# Patient Record
Sex: Female | Born: 1945
Health system: Southern US, Community
[De-identification: ages and names within clinical notes are randomized; demographics above are authoritative.]

## PROBLEM LIST (undated history)

## (undated) DIAGNOSIS — E669 Obesity, unspecified: Secondary | ICD-10-CM

## (undated) DIAGNOSIS — E739 Lactose intolerance, unspecified: Secondary | ICD-10-CM

## (undated) DIAGNOSIS — M549 Dorsalgia, unspecified: Secondary | ICD-10-CM

## (undated) DIAGNOSIS — K219 Gastro-esophageal reflux disease without esophagitis: Secondary | ICD-10-CM

## (undated) DIAGNOSIS — E039 Hypothyroidism, unspecified: Secondary | ICD-10-CM

## (undated) DIAGNOSIS — M159 Polyosteoarthritis, unspecified: Secondary | ICD-10-CM

## (undated) DIAGNOSIS — E785 Hyperlipidemia, unspecified: Secondary | ICD-10-CM

## (undated) DIAGNOSIS — R600 Localized edema: Secondary | ICD-10-CM

## (undated) DIAGNOSIS — I10 Essential (primary) hypertension: Secondary | ICD-10-CM

## (undated) DIAGNOSIS — M2550 Pain in unspecified joint: Secondary | ICD-10-CM

## (undated) DIAGNOSIS — R5381 Other malaise: Secondary | ICD-10-CM

## (undated) DIAGNOSIS — F419 Anxiety disorder, unspecified: Secondary | ICD-10-CM

## (undated) DIAGNOSIS — G4700 Insomnia, unspecified: Secondary | ICD-10-CM

## (undated) HISTORY — PX: JOINT REPLACEMENT: SHX530

## (undated) HISTORY — DX: Pain in unspecified joint: M25.50

## (undated) HISTORY — PX: HIATAL HERNIA REPAIR: SHX195

## (undated) HISTORY — PX: ECTOPIC PREGNANCY SURGERY: SHX613

## (undated) HISTORY — DX: Dorsalgia, unspecified: M54.9

## (undated) HISTORY — DX: Gastro-esophageal reflux disease without esophagitis: K21.9

## (undated) HISTORY — DX: Lactose intolerance, unspecified: E73.9

## (undated) HISTORY — PX: ELBOW FRACTURE SURGERY: SHX616

## (undated) HISTORY — DX: Hyperlipidemia, unspecified: E78.5

## (undated) HISTORY — PX: TONSILLECTOMY: SUR1361

## (undated) HISTORY — DX: Polyosteoarthritis, unspecified: M15.9

## (undated) HISTORY — DX: Insomnia, unspecified: G47.00

## (undated) HISTORY — DX: Hypothyroidism, unspecified: E03.9

## (undated) HISTORY — PX: TUBAL LIGATION: SHX77

## (undated) HISTORY — DX: Localized edema: R60.0

---
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
## 2013-10-03 DIAGNOSIS — M6530 Trigger finger, unspecified finger: Secondary | ICD-10-CM | POA: Diagnosis not present

## 2013-10-09 DIAGNOSIS — M6530 Trigger finger, unspecified finger: Secondary | ICD-10-CM | POA: Diagnosis not present

## 2013-10-21 DIAGNOSIS — R197 Diarrhea, unspecified: Secondary | ICD-10-CM | POA: Diagnosis not present

## 2013-10-21 DIAGNOSIS — K2100 Gastro-esophageal reflux disease with esophagitis, without bleeding: Secondary | ICD-10-CM | POA: Diagnosis not present

## 2013-10-22 DIAGNOSIS — I10 Essential (primary) hypertension: Secondary | ICD-10-CM | POA: Diagnosis not present

## 2013-10-22 DIAGNOSIS — E039 Hypothyroidism, unspecified: Secondary | ICD-10-CM | POA: Diagnosis not present

## 2013-10-22 DIAGNOSIS — E559 Vitamin D deficiency, unspecified: Secondary | ICD-10-CM | POA: Diagnosis not present

## 2013-10-22 DIAGNOSIS — F3289 Other specified depressive episodes: Secondary | ICD-10-CM | POA: Diagnosis not present

## 2013-10-22 DIAGNOSIS — E782 Mixed hyperlipidemia: Secondary | ICD-10-CM | POA: Diagnosis not present

## 2013-10-22 DIAGNOSIS — F329 Major depressive disorder, single episode, unspecified: Secondary | ICD-10-CM | POA: Diagnosis not present

## 2013-10-22 DIAGNOSIS — E785 Hyperlipidemia, unspecified: Secondary | ICD-10-CM | POA: Diagnosis not present

## 2013-10-22 DIAGNOSIS — Z0000 Encounter for general adult medical examination without abnormal findings: Secondary | ICD-10-CM | POA: Diagnosis not present

## 2013-10-23 DIAGNOSIS — K2100 Gastro-esophageal reflux disease with esophagitis, without bleeding: Secondary | ICD-10-CM | POA: Diagnosis not present

## 2013-10-23 DIAGNOSIS — R197 Diarrhea, unspecified: Secondary | ICD-10-CM | POA: Diagnosis not present

## 2013-11-11 DIAGNOSIS — K2100 Gastro-esophageal reflux disease with esophagitis, without bleeding: Secondary | ICD-10-CM | POA: Diagnosis not present

## 2013-11-11 DIAGNOSIS — F3289 Other specified depressive episodes: Secondary | ICD-10-CM | POA: Diagnosis not present

## 2013-11-11 DIAGNOSIS — R141 Gas pain: Secondary | ICD-10-CM | POA: Diagnosis not present

## 2013-11-11 DIAGNOSIS — R197 Diarrhea, unspecified: Secondary | ICD-10-CM | POA: Diagnosis not present

## 2013-11-11 DIAGNOSIS — M549 Dorsalgia, unspecified: Secondary | ICD-10-CM | POA: Diagnosis not present

## 2013-11-11 DIAGNOSIS — K209 Esophagitis, unspecified without bleeding: Secondary | ICD-10-CM | POA: Diagnosis not present

## 2013-11-11 DIAGNOSIS — K2960 Other gastritis without bleeding: Secondary | ICD-10-CM | POA: Diagnosis not present

## 2013-11-11 DIAGNOSIS — F329 Major depressive disorder, single episode, unspecified: Secondary | ICD-10-CM | POA: Diagnosis not present

## 2013-11-11 DIAGNOSIS — Z823 Family history of stroke: Secondary | ICD-10-CM | POA: Diagnosis not present

## 2013-11-11 DIAGNOSIS — M2550 Pain in unspecified joint: Secondary | ICD-10-CM | POA: Diagnosis not present

## 2013-11-11 DIAGNOSIS — R112 Nausea with vomiting, unspecified: Secondary | ICD-10-CM | POA: Diagnosis not present

## 2013-11-11 DIAGNOSIS — I10 Essential (primary) hypertension: Secondary | ICD-10-CM | POA: Diagnosis not present

## 2013-11-11 DIAGNOSIS — F411 Generalized anxiety disorder: Secondary | ICD-10-CM | POA: Diagnosis not present

## 2013-11-11 DIAGNOSIS — E7800 Pure hypercholesterolemia, unspecified: Secondary | ICD-10-CM | POA: Diagnosis not present

## 2013-11-11 DIAGNOSIS — G4700 Insomnia, unspecified: Secondary | ICD-10-CM | POA: Diagnosis not present

## 2013-11-11 DIAGNOSIS — Z7982 Long term (current) use of aspirin: Secondary | ICD-10-CM | POA: Diagnosis not present

## 2013-11-11 DIAGNOSIS — K449 Diaphragmatic hernia without obstruction or gangrene: Secondary | ICD-10-CM | POA: Diagnosis not present

## 2013-11-11 DIAGNOSIS — E079 Disorder of thyroid, unspecified: Secondary | ICD-10-CM | POA: Diagnosis not present

## 2013-11-19 DIAGNOSIS — E669 Obesity, unspecified: Secondary | ICD-10-CM | POA: Diagnosis not present

## 2013-11-19 DIAGNOSIS — Z6839 Body mass index (BMI) 39.0-39.9, adult: Secondary | ICD-10-CM | POA: Diagnosis not present

## 2013-11-19 DIAGNOSIS — E785 Hyperlipidemia, unspecified: Secondary | ICD-10-CM | POA: Diagnosis not present

## 2013-11-19 DIAGNOSIS — E559 Vitamin D deficiency, unspecified: Secondary | ICD-10-CM | POA: Diagnosis not present

## 2013-11-25 DIAGNOSIS — I6529 Occlusion and stenosis of unspecified carotid artery: Secondary | ICD-10-CM | POA: Diagnosis not present

## 2013-11-25 DIAGNOSIS — Z1231 Encounter for screening mammogram for malignant neoplasm of breast: Secondary | ICD-10-CM | POA: Diagnosis not present

## 2013-12-02 DIAGNOSIS — M5137 Other intervertebral disc degeneration, lumbosacral region: Secondary | ICD-10-CM | POA: Diagnosis not present

## 2013-12-02 DIAGNOSIS — M25819 Other specified joint disorders, unspecified shoulder: Secondary | ICD-10-CM | POA: Diagnosis not present

## 2013-12-05 DIAGNOSIS — K2100 Gastro-esophageal reflux disease with esophagitis, without bleeding: Secondary | ICD-10-CM | POA: Diagnosis not present

## 2013-12-16 DIAGNOSIS — M545 Low back pain, unspecified: Secondary | ICD-10-CM | POA: Diagnosis not present

## 2013-12-16 DIAGNOSIS — M25569 Pain in unspecified knee: Secondary | ICD-10-CM | POA: Diagnosis not present

## 2013-12-19 DIAGNOSIS — M5137 Other intervertebral disc degeneration, lumbosacral region: Secondary | ICD-10-CM | POA: Diagnosis not present

## 2013-12-19 DIAGNOSIS — M545 Low back pain, unspecified: Secondary | ICD-10-CM | POA: Diagnosis not present

## 2013-12-19 DIAGNOSIS — M5126 Other intervertebral disc displacement, lumbar region: Secondary | ICD-10-CM | POA: Diagnosis not present

## 2013-12-26 DIAGNOSIS — M25819 Other specified joint disorders, unspecified shoulder: Secondary | ICD-10-CM | POA: Diagnosis not present

## 2014-01-08 DIAGNOSIS — M546 Pain in thoracic spine: Secondary | ICD-10-CM | POA: Diagnosis not present

## 2014-01-08 DIAGNOSIS — M545 Low back pain, unspecified: Secondary | ICD-10-CM | POA: Diagnosis not present

## 2014-01-09 DIAGNOSIS — E785 Hyperlipidemia, unspecified: Secondary | ICD-10-CM | POA: Diagnosis not present

## 2014-01-09 DIAGNOSIS — Z124 Encounter for screening for malignant neoplasm of cervix: Secondary | ICD-10-CM | POA: Diagnosis not present

## 2014-01-09 DIAGNOSIS — I872 Venous insufficiency (chronic) (peripheral): Secondary | ICD-10-CM | POA: Diagnosis not present

## 2014-01-09 DIAGNOSIS — I10 Essential (primary) hypertension: Secondary | ICD-10-CM | POA: Diagnosis not present

## 2014-01-09 DIAGNOSIS — Z01419 Encounter for gynecological examination (general) (routine) without abnormal findings: Secondary | ICD-10-CM | POA: Diagnosis not present

## 2014-01-27 DIAGNOSIS — H2510 Age-related nuclear cataract, unspecified eye: Secondary | ICD-10-CM | POA: Diagnosis not present

## 2014-01-30 DIAGNOSIS — M545 Low back pain, unspecified: Secondary | ICD-10-CM | POA: Diagnosis not present

## 2014-01-30 DIAGNOSIS — M5126 Other intervertebral disc displacement, lumbar region: Secondary | ICD-10-CM | POA: Diagnosis not present

## 2014-01-30 DIAGNOSIS — M5137 Other intervertebral disc degeneration, lumbosacral region: Secondary | ICD-10-CM | POA: Diagnosis not present

## 2014-01-30 DIAGNOSIS — I119 Hypertensive heart disease without heart failure: Secondary | ICD-10-CM | POA: Diagnosis not present

## 2014-02-11 DIAGNOSIS — M5126 Other intervertebral disc displacement, lumbar region: Secondary | ICD-10-CM | POA: Diagnosis not present

## 2014-02-11 DIAGNOSIS — M25569 Pain in unspecified knee: Secondary | ICD-10-CM | POA: Diagnosis not present

## 2014-02-17 DIAGNOSIS — R609 Edema, unspecified: Secondary | ICD-10-CM | POA: Diagnosis not present

## 2014-02-17 DIAGNOSIS — I872 Venous insufficiency (chronic) (peripheral): Secondary | ICD-10-CM | POA: Diagnosis not present

## 2014-02-17 DIAGNOSIS — I739 Peripheral vascular disease, unspecified: Secondary | ICD-10-CM | POA: Diagnosis not present

## 2014-02-22 DIAGNOSIS — N12 Tubulo-interstitial nephritis, not specified as acute or chronic: Secondary | ICD-10-CM | POA: Diagnosis not present

## 2014-02-22 DIAGNOSIS — K3189 Other diseases of stomach and duodenum: Secondary | ICD-10-CM | POA: Diagnosis not present

## 2014-02-22 DIAGNOSIS — E7800 Pure hypercholesterolemia, unspecified: Secondary | ICD-10-CM | POA: Diagnosis not present

## 2014-02-22 DIAGNOSIS — I10 Essential (primary) hypertension: Secondary | ICD-10-CM | POA: Diagnosis not present

## 2014-02-22 DIAGNOSIS — R1084 Generalized abdominal pain: Secondary | ICD-10-CM | POA: Diagnosis not present

## 2014-02-22 DIAGNOSIS — R110 Nausea: Secondary | ICD-10-CM | POA: Diagnosis not present

## 2014-02-22 DIAGNOSIS — R252 Cramp and spasm: Secondary | ICD-10-CM | POA: Diagnosis not present

## 2014-02-22 DIAGNOSIS — IMO0001 Reserved for inherently not codable concepts without codable children: Secondary | ICD-10-CM | POA: Diagnosis not present

## 2014-02-22 DIAGNOSIS — R109 Unspecified abdominal pain: Secondary | ICD-10-CM | POA: Diagnosis not present

## 2014-02-23 DIAGNOSIS — R109 Unspecified abdominal pain: Secondary | ICD-10-CM | POA: Diagnosis not present

## 2014-03-02 DIAGNOSIS — Z6841 Body Mass Index (BMI) 40.0 and over, adult: Secondary | ICD-10-CM | POA: Diagnosis not present

## 2014-03-02 DIAGNOSIS — N170 Acute kidney failure with tubular necrosis: Secondary | ICD-10-CM | POA: Diagnosis not present

## 2014-03-02 DIAGNOSIS — N390 Urinary tract infection, site not specified: Secondary | ICD-10-CM | POA: Diagnosis not present

## 2014-03-02 DIAGNOSIS — Z888 Allergy status to other drugs, medicaments and biological substances status: Secondary | ICD-10-CM | POA: Diagnosis not present

## 2014-03-02 DIAGNOSIS — A419 Sepsis, unspecified organism: Secondary | ICD-10-CM | POA: Diagnosis not present

## 2014-03-02 DIAGNOSIS — R6520 Severe sepsis without septic shock: Secondary | ICD-10-CM | POA: Diagnosis present

## 2014-03-02 DIAGNOSIS — R109 Unspecified abdominal pain: Secondary | ICD-10-CM | POA: Diagnosis not present

## 2014-03-02 DIAGNOSIS — I129 Hypertensive chronic kidney disease with stage 1 through stage 4 chronic kidney disease, or unspecified chronic kidney disease: Secondary | ICD-10-CM | POA: Diagnosis present

## 2014-03-02 DIAGNOSIS — K5900 Constipation, unspecified: Secondary | ICD-10-CM | POA: Diagnosis not present

## 2014-03-02 DIAGNOSIS — F3289 Other specified depressive episodes: Secondary | ICD-10-CM | POA: Diagnosis present

## 2014-03-02 DIAGNOSIS — E871 Hypo-osmolality and hyponatremia: Secondary | ICD-10-CM | POA: Diagnosis not present

## 2014-03-02 DIAGNOSIS — E872 Acidosis, unspecified: Secondary | ICD-10-CM | POA: Diagnosis present

## 2014-03-02 DIAGNOSIS — G4700 Insomnia, unspecified: Secondary | ICD-10-CM | POA: Diagnosis present

## 2014-03-02 DIAGNOSIS — F329 Major depressive disorder, single episode, unspecified: Secondary | ICD-10-CM | POA: Diagnosis present

## 2014-03-02 DIAGNOSIS — N189 Chronic kidney disease, unspecified: Secondary | ICD-10-CM | POA: Diagnosis present

## 2014-03-02 DIAGNOSIS — Z823 Family history of stroke: Secondary | ICD-10-CM | POA: Diagnosis not present

## 2014-03-02 DIAGNOSIS — R339 Retention of urine, unspecified: Secondary | ICD-10-CM | POA: Diagnosis not present

## 2014-03-02 DIAGNOSIS — K219 Gastro-esophageal reflux disease without esophagitis: Secondary | ICD-10-CM | POA: Diagnosis present

## 2014-03-02 DIAGNOSIS — K449 Diaphragmatic hernia without obstruction or gangrene: Secondary | ICD-10-CM | POA: Diagnosis not present

## 2014-03-02 DIAGNOSIS — Z96659 Presence of unspecified artificial knee joint: Secondary | ICD-10-CM | POA: Diagnosis not present

## 2014-03-02 DIAGNOSIS — Z0389 Encounter for observation for other suspected diseases and conditions ruled out: Secondary | ICD-10-CM | POA: Diagnosis not present

## 2014-03-02 DIAGNOSIS — Z7982 Long term (current) use of aspirin: Secondary | ICD-10-CM | POA: Diagnosis not present

## 2014-03-11 DIAGNOSIS — E871 Hypo-osmolality and hyponatremia: Secondary | ICD-10-CM | POA: Diagnosis not present

## 2014-03-11 DIAGNOSIS — G4700 Insomnia, unspecified: Secondary | ICD-10-CM | POA: Diagnosis not present

## 2014-03-11 DIAGNOSIS — N19 Unspecified kidney failure: Secondary | ICD-10-CM | POA: Diagnosis not present

## 2014-03-11 DIAGNOSIS — I10 Essential (primary) hypertension: Secondary | ICD-10-CM | POA: Diagnosis not present

## 2014-03-17 DIAGNOSIS — I739 Peripheral vascular disease, unspecified: Secondary | ICD-10-CM | POA: Diagnosis not present

## 2014-03-17 DIAGNOSIS — D518 Other vitamin B12 deficiency anemias: Secondary | ICD-10-CM | POA: Diagnosis not present

## 2014-03-18 DIAGNOSIS — M545 Low back pain, unspecified: Secondary | ICD-10-CM | POA: Diagnosis not present

## 2014-03-19 DIAGNOSIS — L819 Disorder of pigmentation, unspecified: Secondary | ICD-10-CM | POA: Diagnosis not present

## 2014-03-19 DIAGNOSIS — L919 Hypertrophic disorder of the skin, unspecified: Secondary | ICD-10-CM | POA: Diagnosis not present

## 2014-03-19 DIAGNOSIS — L909 Atrophic disorder of skin, unspecified: Secondary | ICD-10-CM | POA: Diagnosis not present

## 2014-03-19 DIAGNOSIS — L820 Inflamed seborrheic keratosis: Secondary | ICD-10-CM | POA: Diagnosis not present

## 2014-04-16 DIAGNOSIS — L820 Inflamed seborrheic keratosis: Secondary | ICD-10-CM | POA: Diagnosis not present

## 2014-04-16 DIAGNOSIS — L538 Other specified erythematous conditions: Secondary | ICD-10-CM | POA: Diagnosis not present

## 2014-04-16 DIAGNOSIS — L909 Atrophic disorder of skin, unspecified: Secondary | ICD-10-CM | POA: Diagnosis not present

## 2014-04-16 DIAGNOSIS — L919 Hypertrophic disorder of the skin, unspecified: Secondary | ICD-10-CM | POA: Diagnosis not present

## 2014-04-17 DIAGNOSIS — M48061 Spinal stenosis, lumbar region without neurogenic claudication: Secondary | ICD-10-CM | POA: Diagnosis not present

## 2014-04-17 DIAGNOSIS — I10 Essential (primary) hypertension: Secondary | ICD-10-CM | POA: Diagnosis not present

## 2014-04-17 DIAGNOSIS — M545 Low back pain, unspecified: Secondary | ICD-10-CM | POA: Diagnosis not present

## 2014-04-22 DIAGNOSIS — M545 Low back pain, unspecified: Secondary | ICD-10-CM | POA: Diagnosis not present

## 2014-04-22 DIAGNOSIS — M25569 Pain in unspecified knee: Secondary | ICD-10-CM | POA: Diagnosis not present

## 2014-05-04 DIAGNOSIS — N390 Urinary tract infection, site not specified: Secondary | ICD-10-CM | POA: Diagnosis not present

## 2014-05-06 DIAGNOSIS — M549 Dorsalgia, unspecified: Secondary | ICD-10-CM | POA: Diagnosis not present

## 2014-05-06 DIAGNOSIS — Z7982 Long term (current) use of aspirin: Secondary | ICD-10-CM | POA: Diagnosis not present

## 2014-05-06 DIAGNOSIS — J209 Acute bronchitis, unspecified: Secondary | ICD-10-CM | POA: Diagnosis not present

## 2014-05-06 DIAGNOSIS — E7800 Pure hypercholesterolemia, unspecified: Secondary | ICD-10-CM | POA: Diagnosis not present

## 2014-05-06 DIAGNOSIS — I10 Essential (primary) hypertension: Secondary | ICD-10-CM | POA: Diagnosis not present

## 2014-05-06 DIAGNOSIS — E860 Dehydration: Secondary | ICD-10-CM | POA: Diagnosis not present

## 2014-05-06 DIAGNOSIS — Z79899 Other long term (current) drug therapy: Secondary | ICD-10-CM | POA: Diagnosis not present

## 2014-05-06 DIAGNOSIS — G8929 Other chronic pain: Secondary | ICD-10-CM | POA: Diagnosis not present

## 2014-05-06 DIAGNOSIS — J40 Bronchitis, not specified as acute or chronic: Secondary | ICD-10-CM | POA: Diagnosis not present

## 2014-05-06 DIAGNOSIS — E871 Hypo-osmolality and hyponatremia: Secondary | ICD-10-CM | POA: Diagnosis not present

## 2014-05-06 DIAGNOSIS — I959 Hypotension, unspecified: Secondary | ICD-10-CM | POA: Diagnosis not present

## 2014-05-06 DIAGNOSIS — F3289 Other specified depressive episodes: Secondary | ICD-10-CM | POA: Diagnosis not present

## 2014-05-06 DIAGNOSIS — F329 Major depressive disorder, single episode, unspecified: Secondary | ICD-10-CM | POA: Diagnosis not present

## 2014-05-06 DIAGNOSIS — Z885 Allergy status to narcotic agent status: Secondary | ICD-10-CM | POA: Diagnosis not present

## 2014-05-06 DIAGNOSIS — G4700 Insomnia, unspecified: Secondary | ICD-10-CM | POA: Diagnosis not present

## 2014-05-13 DIAGNOSIS — N390 Urinary tract infection, site not specified: Secondary | ICD-10-CM | POA: Diagnosis not present

## 2014-06-13 DIAGNOSIS — M5416 Radiculopathy, lumbar region: Secondary | ICD-10-CM | POA: Diagnosis not present

## 2014-06-26 DIAGNOSIS — M545 Low back pain: Secondary | ICD-10-CM | POA: Diagnosis not present

## 2014-06-26 DIAGNOSIS — M5136 Other intervertebral disc degeneration, lumbar region: Secondary | ICD-10-CM | POA: Diagnosis not present

## 2014-06-26 DIAGNOSIS — M4806 Spinal stenosis, lumbar region: Secondary | ICD-10-CM | POA: Diagnosis not present

## 2014-07-08 DIAGNOSIS — F329 Major depressive disorder, single episode, unspecified: Secondary | ICD-10-CM | POA: Diagnosis not present

## 2014-07-08 DIAGNOSIS — I10 Essential (primary) hypertension: Secondary | ICD-10-CM | POA: Diagnosis not present

## 2014-07-08 DIAGNOSIS — E782 Mixed hyperlipidemia: Secondary | ICD-10-CM | POA: Diagnosis not present

## 2014-07-08 DIAGNOSIS — Z23 Encounter for immunization: Secondary | ICD-10-CM | POA: Diagnosis not present

## 2014-07-09 DIAGNOSIS — I10 Essential (primary) hypertension: Secondary | ICD-10-CM | POA: Diagnosis not present

## 2014-07-09 DIAGNOSIS — E782 Mixed hyperlipidemia: Secondary | ICD-10-CM | POA: Diagnosis not present

## 2014-07-11 DIAGNOSIS — M5416 Radiculopathy, lumbar region: Secondary | ICD-10-CM | POA: Diagnosis not present

## 2014-07-11 DIAGNOSIS — M5137 Other intervertebral disc degeneration, lumbosacral region: Secondary | ICD-10-CM | POA: Diagnosis not present

## 2014-07-11 DIAGNOSIS — M5127 Other intervertebral disc displacement, lumbosacral region: Secondary | ICD-10-CM | POA: Diagnosis not present

## 2014-07-11 DIAGNOSIS — M545 Low back pain: Secondary | ICD-10-CM | POA: Diagnosis not present

## 2014-07-30 DIAGNOSIS — L298 Other pruritus: Secondary | ICD-10-CM | POA: Diagnosis not present

## 2014-07-30 DIAGNOSIS — L820 Inflamed seborrheic keratosis: Secondary | ICD-10-CM | POA: Diagnosis not present

## 2014-07-30 DIAGNOSIS — L918 Other hypertrophic disorders of the skin: Secondary | ICD-10-CM | POA: Diagnosis not present

## 2014-08-28 DIAGNOSIS — M7052 Other bursitis of knee, left knee: Secondary | ICD-10-CM | POA: Diagnosis not present

## 2014-09-10 DIAGNOSIS — N951 Menopausal and female climacteric states: Secondary | ICD-10-CM | POA: Diagnosis not present

## 2014-09-10 DIAGNOSIS — Z1211 Encounter for screening for malignant neoplasm of colon: Secondary | ICD-10-CM | POA: Diagnosis not present

## 2014-09-10 DIAGNOSIS — Z6841 Body Mass Index (BMI) 40.0 and over, adult: Secondary | ICD-10-CM | POA: Diagnosis not present

## 2014-09-10 DIAGNOSIS — N941 Dyspareunia: Secondary | ICD-10-CM | POA: Diagnosis not present

## 2014-09-11 DIAGNOSIS — F331 Major depressive disorder, recurrent, moderate: Secondary | ICD-10-CM | POA: Diagnosis not present

## 2014-09-11 DIAGNOSIS — I10 Essential (primary) hypertension: Secondary | ICD-10-CM | POA: Diagnosis not present

## 2014-09-11 DIAGNOSIS — E782 Mixed hyperlipidemia: Secondary | ICD-10-CM | POA: Diagnosis not present

## 2014-10-03 DIAGNOSIS — M545 Low back pain: Secondary | ICD-10-CM | POA: Diagnosis not present

## 2014-10-03 DIAGNOSIS — M5416 Radiculopathy, lumbar region: Secondary | ICD-10-CM | POA: Diagnosis not present

## 2014-10-03 DIAGNOSIS — M5127 Other intervertebral disc displacement, lumbosacral region: Secondary | ICD-10-CM | POA: Diagnosis not present

## 2014-10-03 DIAGNOSIS — M5137 Other intervertebral disc degeneration, lumbosacral region: Secondary | ICD-10-CM | POA: Diagnosis not present

## 2014-10-15 DIAGNOSIS — L814 Other melanin hyperpigmentation: Secondary | ICD-10-CM | POA: Diagnosis not present

## 2014-10-15 DIAGNOSIS — L820 Inflamed seborrheic keratosis: Secondary | ICD-10-CM | POA: Diagnosis not present

## 2014-10-15 DIAGNOSIS — L918 Other hypertrophic disorders of the skin: Secondary | ICD-10-CM | POA: Diagnosis not present

## 2014-10-23 DIAGNOSIS — R194 Change in bowel habit: Secondary | ICD-10-CM | POA: Diagnosis not present

## 2014-10-24 DIAGNOSIS — M5441 Lumbago with sciatica, right side: Secondary | ICD-10-CM | POA: Diagnosis not present

## 2014-10-24 DIAGNOSIS — M7061 Trochanteric bursitis, right hip: Secondary | ICD-10-CM | POA: Diagnosis not present

## 2014-10-24 DIAGNOSIS — M25551 Pain in right hip: Secondary | ICD-10-CM | POA: Diagnosis not present

## 2014-10-24 DIAGNOSIS — M5416 Radiculopathy, lumbar region: Secondary | ICD-10-CM | POA: Diagnosis not present

## 2014-10-28 DIAGNOSIS — E669 Obesity, unspecified: Secondary | ICD-10-CM | POA: Diagnosis not present

## 2014-10-28 HISTORY — PX: COLONOSCOPY: SHX174

## 2014-10-29 DIAGNOSIS — Z885 Allergy status to narcotic agent status: Secondary | ICD-10-CM | POA: Diagnosis not present

## 2014-10-29 DIAGNOSIS — K5900 Constipation, unspecified: Secondary | ICD-10-CM | POA: Diagnosis not present

## 2014-10-29 DIAGNOSIS — F419 Anxiety disorder, unspecified: Secondary | ICD-10-CM | POA: Diagnosis not present

## 2014-10-29 DIAGNOSIS — Z791 Long term (current) use of non-steroidal anti-inflammatories (NSAID): Secondary | ICD-10-CM | POA: Diagnosis not present

## 2014-10-29 DIAGNOSIS — E739 Lactose intolerance, unspecified: Secondary | ICD-10-CM | POA: Diagnosis not present

## 2014-10-29 DIAGNOSIS — K648 Other hemorrhoids: Secondary | ICD-10-CM | POA: Diagnosis not present

## 2014-10-29 DIAGNOSIS — I10 Essential (primary) hypertension: Secondary | ICD-10-CM | POA: Diagnosis not present

## 2014-10-29 DIAGNOSIS — E780 Pure hypercholesterolemia: Secondary | ICD-10-CM | POA: Diagnosis not present

## 2014-10-29 DIAGNOSIS — Z7982 Long term (current) use of aspirin: Secondary | ICD-10-CM | POA: Diagnosis not present

## 2014-10-29 DIAGNOSIS — R194 Change in bowel habit: Secondary | ICD-10-CM | POA: Diagnosis not present

## 2014-11-04 DIAGNOSIS — M4806 Spinal stenosis, lumbar region: Secondary | ICD-10-CM | POA: Diagnosis not present

## 2014-11-04 DIAGNOSIS — M5440 Lumbago with sciatica, unspecified side: Secondary | ICD-10-CM | POA: Diagnosis not present

## 2014-11-13 DIAGNOSIS — M25561 Pain in right knee: Secondary | ICD-10-CM | POA: Diagnosis not present

## 2014-11-20 DIAGNOSIS — N390 Urinary tract infection, site not specified: Secondary | ICD-10-CM | POA: Diagnosis not present

## 2014-12-03 DIAGNOSIS — M5416 Radiculopathy, lumbar region: Secondary | ICD-10-CM | POA: Diagnosis not present

## 2014-12-03 DIAGNOSIS — M5137 Other intervertebral disc degeneration, lumbosacral region: Secondary | ICD-10-CM | POA: Diagnosis not present

## 2014-12-03 DIAGNOSIS — M25551 Pain in right hip: Secondary | ICD-10-CM | POA: Diagnosis not present

## 2014-12-03 DIAGNOSIS — N390 Urinary tract infection, site not specified: Secondary | ICD-10-CM | POA: Diagnosis not present

## 2014-12-03 DIAGNOSIS — M7061 Trochanteric bursitis, right hip: Secondary | ICD-10-CM | POA: Diagnosis not present

## 2014-12-03 DIAGNOSIS — R109 Unspecified abdominal pain: Secondary | ICD-10-CM | POA: Diagnosis not present

## 2014-12-04 DIAGNOSIS — N390 Urinary tract infection, site not specified: Secondary | ICD-10-CM | POA: Diagnosis not present

## 2014-12-04 DIAGNOSIS — N289 Disorder of kidney and ureter, unspecified: Secondary | ICD-10-CM | POA: Diagnosis not present

## 2015-01-07 DIAGNOSIS — M79671 Pain in right foot: Secondary | ICD-10-CM | POA: Diagnosis not present

## 2015-01-07 DIAGNOSIS — M722 Plantar fascial fibromatosis: Secondary | ICD-10-CM | POA: Diagnosis not present

## 2015-01-12 DIAGNOSIS — M79671 Pain in right foot: Secondary | ICD-10-CM | POA: Diagnosis not present

## 2015-01-12 DIAGNOSIS — M25674 Stiffness of right foot, not elsewhere classified: Secondary | ICD-10-CM | POA: Diagnosis not present

## 2015-01-12 DIAGNOSIS — R262 Difficulty in walking, not elsewhere classified: Secondary | ICD-10-CM | POA: Diagnosis not present

## 2015-01-12 DIAGNOSIS — M25671 Stiffness of right ankle, not elsewhere classified: Secondary | ICD-10-CM | POA: Diagnosis not present

## 2015-01-14 DIAGNOSIS — L708 Other acne: Secondary | ICD-10-CM | POA: Diagnosis not present

## 2015-01-14 DIAGNOSIS — S30860A Insect bite (nonvenomous) of lower back and pelvis, initial encounter: Secondary | ICD-10-CM | POA: Diagnosis not present

## 2015-01-15 DIAGNOSIS — M25671 Stiffness of right ankle, not elsewhere classified: Secondary | ICD-10-CM | POA: Diagnosis not present

## 2015-01-15 DIAGNOSIS — M79671 Pain in right foot: Secondary | ICD-10-CM | POA: Diagnosis not present

## 2015-01-15 DIAGNOSIS — M25674 Stiffness of right foot, not elsewhere classified: Secondary | ICD-10-CM | POA: Diagnosis not present

## 2015-01-15 DIAGNOSIS — R262 Difficulty in walking, not elsewhere classified: Secondary | ICD-10-CM | POA: Diagnosis not present

## 2015-02-04 DIAGNOSIS — I10 Essential (primary) hypertension: Secondary | ICD-10-CM | POA: Diagnosis not present

## 2015-02-04 DIAGNOSIS — Z1321 Encounter for screening for nutritional disorder: Secondary | ICD-10-CM | POA: Diagnosis not present

## 2015-02-04 DIAGNOSIS — R5383 Other fatigue: Secondary | ICD-10-CM | POA: Diagnosis not present

## 2015-02-04 DIAGNOSIS — E785 Hyperlipidemia, unspecified: Secondary | ICD-10-CM | POA: Diagnosis not present

## 2015-02-04 DIAGNOSIS — F419 Anxiety disorder, unspecified: Secondary | ICD-10-CM | POA: Diagnosis not present

## 2015-02-04 DIAGNOSIS — Z131 Encounter for screening for diabetes mellitus: Secondary | ICD-10-CM | POA: Diagnosis not present

## 2015-02-16 DIAGNOSIS — R5383 Other fatigue: Secondary | ICD-10-CM | POA: Diagnosis not present

## 2015-02-16 DIAGNOSIS — F419 Anxiety disorder, unspecified: Secondary | ICD-10-CM | POA: Diagnosis not present

## 2015-02-16 DIAGNOSIS — E785 Hyperlipidemia, unspecified: Secondary | ICD-10-CM | POA: Diagnosis not present

## 2015-02-18 DIAGNOSIS — M79671 Pain in right foot: Secondary | ICD-10-CM | POA: Diagnosis not present

## 2015-02-18 DIAGNOSIS — M216X1 Other acquired deformities of right foot: Secondary | ICD-10-CM | POA: Diagnosis not present

## 2015-02-18 DIAGNOSIS — M722 Plantar fascial fibromatosis: Secondary | ICD-10-CM | POA: Diagnosis not present

## 2015-02-24 ENCOUNTER — Ambulatory Visit: Payer: Self-pay | Admitting: Orthopedic Surgery

## 2015-03-19 DIAGNOSIS — I10 Essential (primary) hypertension: Secondary | ICD-10-CM | POA: Diagnosis not present

## 2015-03-19 DIAGNOSIS — R0789 Other chest pain: Secondary | ICD-10-CM | POA: Diagnosis not present

## 2015-03-19 DIAGNOSIS — R0600 Dyspnea, unspecified: Secondary | ICD-10-CM | POA: Diagnosis not present

## 2015-03-26 DIAGNOSIS — R0600 Dyspnea, unspecified: Secondary | ICD-10-CM | POA: Diagnosis not present

## 2015-04-27 DIAGNOSIS — I119 Hypertensive heart disease without heart failure: Secondary | ICD-10-CM | POA: Diagnosis not present

## 2015-04-27 DIAGNOSIS — I519 Heart disease, unspecified: Secondary | ICD-10-CM | POA: Diagnosis not present

## 2015-04-27 DIAGNOSIS — E785 Hyperlipidemia, unspecified: Secondary | ICD-10-CM | POA: Diagnosis not present

## 2015-04-27 DIAGNOSIS — R5383 Other fatigue: Secondary | ICD-10-CM | POA: Diagnosis not present

## 2015-04-30 DIAGNOSIS — L723 Sebaceous cyst: Secondary | ICD-10-CM | POA: Diagnosis not present

## 2015-05-22 DIAGNOSIS — Z23 Encounter for immunization: Secondary | ICD-10-CM | POA: Diagnosis not present

## 2015-05-27 ENCOUNTER — Other Ambulatory Visit (HOSPITAL_COMMUNITY): Payer: Self-pay | Admitting: Internal Medicine

## 2015-05-27 DIAGNOSIS — I119 Hypertensive heart disease without heart failure: Secondary | ICD-10-CM | POA: Diagnosis not present

## 2015-05-27 DIAGNOSIS — E559 Vitamin D deficiency, unspecified: Secondary | ICD-10-CM

## 2015-05-27 DIAGNOSIS — I519 Heart disease, unspecified: Secondary | ICD-10-CM | POA: Diagnosis not present

## 2015-05-27 DIAGNOSIS — E785 Hyperlipidemia, unspecified: Secondary | ICD-10-CM | POA: Diagnosis not present

## 2015-05-27 DIAGNOSIS — R739 Hyperglycemia, unspecified: Secondary | ICD-10-CM | POA: Diagnosis not present

## 2015-05-27 DIAGNOSIS — R5383 Other fatigue: Secondary | ICD-10-CM | POA: Diagnosis not present

## 2015-06-10 ENCOUNTER — Other Ambulatory Visit (HOSPITAL_COMMUNITY): Payer: Self-pay

## 2015-06-10 ENCOUNTER — Ambulatory Visit (HOSPITAL_COMMUNITY)
Admission: RE | Admit: 2015-06-10 | Discharge: 2015-06-10 | Disposition: A | Discharge status: Home / Self Care | Payer: Medicare Other | Source: Ambulatory Visit | Attending: Internal Medicine | Admitting: Internal Medicine

## 2015-06-10 DIAGNOSIS — Z780 Asymptomatic menopausal state: Secondary | ICD-10-CM | POA: Insufficient documentation

## 2015-06-10 DIAGNOSIS — Z13820 Encounter for screening for osteoporosis: Secondary | ICD-10-CM | POA: Diagnosis not present

## 2015-06-10 DIAGNOSIS — E559 Vitamin D deficiency, unspecified: Secondary | ICD-10-CM | POA: Insufficient documentation

## 2015-06-24 DIAGNOSIS — H2513 Age-related nuclear cataract, bilateral: Secondary | ICD-10-CM | POA: Diagnosis not present

## 2015-06-24 DIAGNOSIS — H4323 Crystalline deposits in vitreous body, bilateral: Secondary | ICD-10-CM | POA: Diagnosis not present

## 2015-06-24 DIAGNOSIS — H52223 Regular astigmatism, bilateral: Secondary | ICD-10-CM | POA: Diagnosis not present

## 2015-06-24 DIAGNOSIS — H5203 Hypermetropia, bilateral: Secondary | ICD-10-CM | POA: Diagnosis not present

## 2015-07-01 DIAGNOSIS — E785 Hyperlipidemia, unspecified: Secondary | ICD-10-CM | POA: Diagnosis not present

## 2015-07-01 DIAGNOSIS — E559 Vitamin D deficiency, unspecified: Secondary | ICD-10-CM | POA: Diagnosis not present

## 2015-07-01 DIAGNOSIS — M545 Low back pain: Secondary | ICD-10-CM | POA: Diagnosis not present

## 2015-07-01 DIAGNOSIS — I119 Hypertensive heart disease without heart failure: Secondary | ICD-10-CM | POA: Diagnosis not present

## 2015-08-14 DIAGNOSIS — M7671 Peroneal tendinitis, right leg: Secondary | ICD-10-CM | POA: Diagnosis not present

## 2015-08-14 DIAGNOSIS — M79671 Pain in right foot: Secondary | ICD-10-CM | POA: Diagnosis not present

## 2015-08-20 DIAGNOSIS — L309 Dermatitis, unspecified: Secondary | ICD-10-CM | POA: Diagnosis not present

## 2015-09-14 DIAGNOSIS — R739 Hyperglycemia, unspecified: Secondary | ICD-10-CM | POA: Diagnosis not present

## 2015-09-14 DIAGNOSIS — I119 Hypertensive heart disease without heart failure: Secondary | ICD-10-CM | POA: Diagnosis not present

## 2015-09-14 DIAGNOSIS — M8580 Other specified disorders of bone density and structure, unspecified site: Secondary | ICD-10-CM | POA: Diagnosis not present

## 2015-09-14 DIAGNOSIS — E559 Vitamin D deficiency, unspecified: Secondary | ICD-10-CM | POA: Diagnosis not present

## 2015-09-21 DIAGNOSIS — R109 Unspecified abdominal pain: Secondary | ICD-10-CM | POA: Diagnosis not present

## 2015-10-21 DIAGNOSIS — N390 Urinary tract infection, site not specified: Secondary | ICD-10-CM | POA: Diagnosis not present

## 2015-10-27 DIAGNOSIS — Z6838 Body mass index (BMI) 38.0-38.9, adult: Secondary | ICD-10-CM | POA: Diagnosis not present

## 2015-10-29 ENCOUNTER — Ambulatory Visit (INDEPENDENT_AMBULATORY_CARE_PROVIDER_SITE_OTHER): Payer: Medicare Other | Admitting: Orthopaedic Surgery

## 2015-10-29 ENCOUNTER — Ambulatory Visit (INDEPENDENT_AMBULATORY_CARE_PROVIDER_SITE_OTHER): Payer: Medicare Other

## 2015-10-29 VITALS — BP 133/83 | HR 93 | Temp 97.5°F | Ht 61.0 in | Wt 213.6 lb

## 2015-10-29 DIAGNOSIS — M5442 Lumbago with sciatica, left side: Secondary | ICD-10-CM

## 2015-10-29 DIAGNOSIS — M5440 Lumbago with sciatica, unspecified side: Secondary | ICD-10-CM

## 2015-10-29 MED ORDER — HYDROCODONE-ACETAMINOPHEN 5-325 MG PO TABS
1.0000 | ORAL_TABLET | ORAL | Status: DC | PRN
Start: 2015-10-29 — End: 2015-12-08

## 2015-10-29 NOTE — Patient Instructions (Signed)
To get MRI of the lumbar spine

## 2015-10-29 NOTE — Progress Notes (Addendum)
CC:  Lower back pain with left sided shooting pain  Subjective:    Patient ID: Annette Saunders, female    DOB: [birth year], 70 y.o.   MRN: [id number]  Back Pain This is a chronic problem. The current episode started more than 1 year ago. The problem occurs daily. The problem has been gradually worsening since onset. The pain is present in the lumbar spine. The quality of the pain is described as aching, burning and shooting. The pain radiates to the left knee, left thigh and left foot. The pain is at a severity of 5/10. The pain is moderate. The pain is worse during the day. The symptoms are aggravated by bending, standing, twisting and stress. Pertinent negatives include no chest pain. She has tried bed rest, home exercises, heat, ice, muscle relaxant and walking for the symptoms. The treatment provided mild relief.   She has had pain in the back on and off for years.  She had epidurals when she lived in [place] several years ago that helped.  She has just recently had more pain that has not responded to her rest and home treatments.   Review of Systems  Constitutional:       Patient does not have diabetes Patient has hypertension Patient does not have COPD Patient does not smoke.  HENT: Negative for congestion.   Respiratory: Negative for cough and shortness of breath.   Cardiovascular: Negative for chest pain.  Musculoskeletal: Positive for myalgias and back pain.  Allergic/Immunologic: Negative for environmental allergies.  All other systems reviewed and are negative.  Social History   Social History  . Marital Status: Widowed    Spouse Name: N/A  . Number of Children: N/A  . Years of Education: N/A   Occupational History  . Not on file.   Social History Main Topics  . Smoking status: Not on file  . Smokeless tobacco: Not on file  . Alcohol Use: Not on file  . Drug Use: Not on file  . Sexual Activity: Not on file   Other Topics Concern  . Not on file   Social History  Narrative  . No narrative on file   No past surgical history on file.  No past medical history on file.  The patient has a family history of hypertension  BP 133/83 mmHg  Pulse 93  Temp(Src) 97.5 F (36.4 C)  Ht 5\' 1"  (1.549 m)  Wt 213 lb 9.6 oz (96.888 kg)  BMI 40.38 kg/m2      Objective:   Physical Exam  Constitutional: She is oriented to person, place, and time. She appears well-developed and well-nourished.  HENT:  Head: Normocephalic and atraumatic.  Eyes: Conjunctivae and EOM are normal. Pupils are equal, round, and reactive to light.  Neck: Normal range of motion. Neck supple.  Cardiovascular: Normal rate, regular rhythm, normal heart sounds and intact distal pulses.   Pulmonary/Chest: Effort normal and breath sounds normal.  Abdominal: Soft.  Musculoskeletal: She exhibits tenderness (Pain midline lower back. She has no spams, no defects. ).       Lumbar back: She exhibits tenderness and pain.       Back:  Neurological: She is alert and oriented to person, place, and time. She has normal reflexes. She displays normal reflexes. No cranial nerve deficit. She exhibits normal muscle tone. Coordination normal.  Skin: Skin is warm and dry.  Psychiatric: She has a normal mood and affect. Her behavior is normal. Judgment and thought content normal.   Spine/Pelvis  examination:  Inspection:  Overall, sacoiliac joint benign and hips nontender; without crepitus or defects.   Thoracic spine inspection: Alignment normal without kyphosis present   Lumbar spine inspection:  Alignment  with normal lumbar lordosis, without scoliosis apparent.   Thoracic spine palpation:  without tenderness of spinal processes   Lumbar spine palpation: with tenderness of lumbar area; without tightness of lumbar muscles    Range of Motion:   Lumbar flexion, forward flexion is 25  with pain or tenderness    Lumbar extension is 5  with pain or tenderness   Left lateral bend is Normal  without pain  or tenderness   Right lateral bend is Normal without pain or tenderness   Straight leg raising is Normal   Strength & tone: Normal   Stability overall normal stability    Her hypertension is well controlled.  She is trying to be active and she is taking care of her elderly mother and the lifting that she does with her is making the patient's back pain worse. Encounter Diagnosis  Name Primary?  . Midline low back pain with left-sided sciatica Yes        Assessment & Plan:  Lower back pain with left sided sciatica  I will get MRI of the lumbar spine  Pain medicine has been given  Call if any problems.

## 2015-11-04 DIAGNOSIS — R0600 Dyspnea, unspecified: Secondary | ICD-10-CM | POA: Diagnosis not present

## 2015-11-04 DIAGNOSIS — E559 Vitamin D deficiency, unspecified: Secondary | ICD-10-CM | POA: Diagnosis not present

## 2015-11-04 DIAGNOSIS — Z13220 Encounter for screening for lipoid disorders: Secondary | ICD-10-CM | POA: Diagnosis not present

## 2015-11-04 DIAGNOSIS — M7070 Other bursitis of hip, unspecified hip: Secondary | ICD-10-CM | POA: Diagnosis not present

## 2015-11-04 DIAGNOSIS — R5383 Other fatigue: Secondary | ICD-10-CM | POA: Diagnosis not present

## 2015-11-04 DIAGNOSIS — R739 Hyperglycemia, unspecified: Secondary | ICD-10-CM | POA: Diagnosis not present

## 2015-11-04 DIAGNOSIS — Z6838 Body mass index (BMI) 38.0-38.9, adult: Secondary | ICD-10-CM | POA: Diagnosis not present

## 2015-11-09 DIAGNOSIS — Z6839 Body mass index (BMI) 39.0-39.9, adult: Secondary | ICD-10-CM | POA: Diagnosis not present

## 2015-11-10 ENCOUNTER — Other Ambulatory Visit: Payer: Self-pay | Admitting: *Deleted

## 2015-11-10 DIAGNOSIS — M5442 Lumbago with sciatica, left side: Secondary | ICD-10-CM

## 2015-11-19 ENCOUNTER — Ambulatory Visit (HOSPITAL_COMMUNITY)
Admission: RE | Admit: 2015-11-19 | Discharge: 2015-11-19 | Disposition: A | Discharge status: Home / Self Care | Payer: Medicare Other | Source: Ambulatory Visit | Attending: Orthopaedic Surgery | Admitting: Orthopaedic Surgery

## 2015-11-19 DIAGNOSIS — Z6838 Body mass index (BMI) 38.0-38.9, adult: Secondary | ICD-10-CM | POA: Diagnosis not present

## 2015-11-19 DIAGNOSIS — M419 Scoliosis, unspecified: Secondary | ICD-10-CM | POA: Diagnosis not present

## 2015-11-19 DIAGNOSIS — M479 Spondylosis, unspecified: Secondary | ICD-10-CM | POA: Diagnosis not present

## 2015-11-19 DIAGNOSIS — M5125 Other intervertebral disc displacement, thoracolumbar region: Secondary | ICD-10-CM | POA: Insufficient documentation

## 2015-11-19 DIAGNOSIS — M5442 Lumbago with sciatica, left side: Secondary | ICD-10-CM | POA: Insufficient documentation

## 2015-11-19 DIAGNOSIS — M4807 Spinal stenosis, lumbosacral region: Secondary | ICD-10-CM | POA: Insufficient documentation

## 2015-11-19 DIAGNOSIS — M4806 Spinal stenosis, lumbar region: Secondary | ICD-10-CM | POA: Diagnosis not present

## 2015-11-19 DIAGNOSIS — M2578 Osteophyte, vertebrae: Secondary | ICD-10-CM | POA: Diagnosis not present

## 2015-11-25 ENCOUNTER — Telehealth: Payer: Self-pay | Admitting: Radiology

## 2015-11-25 NOTE — Telephone Encounter (Signed)
I left 2 messages on the patients answering machine with her MRI appointment at [facility]. She has [place], no precert was needed.

## 2015-11-30 DIAGNOSIS — Z6839 Body mass index (BMI) 39.0-39.9, adult: Secondary | ICD-10-CM | POA: Diagnosis not present

## 2015-12-01 ENCOUNTER — Ambulatory Visit: Payer: Medicare Other | Admitting: Orthopaedic Surgery

## 2015-12-01 ENCOUNTER — Encounter: Payer: Self-pay | Admitting: Orthopaedic Surgery

## 2015-12-01 ENCOUNTER — Ambulatory Visit (INDEPENDENT_AMBULATORY_CARE_PROVIDER_SITE_OTHER): Payer: Medicare Other | Admitting: Orthopaedic Surgery

## 2015-12-01 VITALS — BP 89/63 | HR 71 | Temp 97.9°F | Resp 16 | Ht 61.0 in | Wt 212.0 lb

## 2015-12-01 DIAGNOSIS — M5136 Other intervertebral disc degeneration, lumbar region: Secondary | ICD-10-CM

## 2015-12-01 DIAGNOSIS — M5442 Lumbago with sciatica, left side: Secondary | ICD-10-CM | POA: Diagnosis not present

## 2015-12-01 DIAGNOSIS — M5126 Other intervertebral disc displacement, lumbar region: Secondary | ICD-10-CM | POA: Diagnosis not present

## 2015-12-01 NOTE — Patient Instructions (Signed)
You have been referred to [place] [facility] for possible ESI.

## 2015-12-01 NOTE — Progress Notes (Signed)
Patient [id number] Annette Saunders, female DOB:07/23/1946, [name]  Chief Complaint  Patient presents with  . Follow-up    follow up MRI results L spine    HPI  Annette Saunders is a 70 y.o. female who has left sided sciatica that is unchanged.  She had the MRI of the lumbar spine.  She has no bowel or bladder problem.  She has no trauma.  She is not taking her medicine.   MRI report is as follows: IMPRESSION: 1. Multilevel lumbar spondylosis associated with a convex right scoliosis. 2. Right paracentral disc extrusion at T12-L1 with probable associated epidural blood. No mass effect on the conus or exiting nerve roots. 3. Eccentric disc bulging and endplate osteophytes related to the scoliosis, contributing to mild foraminal narrowing and potential extraforaminal nerve root encroachment on the left at L3-4, on the right at L4-5 and bilaterally at L5-S1. HPI  Body mass index is 40.08 kg/(m^2).  Review of Systems  Constitutional:       Patient does not have diabetes Patient has hypertension Patient does not have COPD Patient does not smoke.  HENT: Negative for congestion.   Respiratory: Negative for cough and shortness of breath.   Cardiovascular: Negative for chest pain.  Musculoskeletal: Positive for myalgias and back pain.  Allergic/Immunologic: Negative for environmental allergies.  All other systems reviewed and are negative.   No past medical history on file.  No past surgical history on file.  No family history on file.  Social History Social History  Substance Use Topics  . Smoking status: Never Smoker   . Smokeless tobacco: None  . Alcohol Use: None    No Known Allergies  Current Outpatient Prescriptions  Medication Sig Dispense Refill  . amLODipine (NORVASC) 2.5 MG tablet Take 2.5 mg by mouth daily.    [name] aspirin 81 MG tablet Take 81 mg by mouth daily.    [name] atorvastatin (LIPITOR) 40 MG tablet Take 40 mg by mouth daily.    [name] esomeprazole  (NEXIUM) 20 MG capsule Take 20 mg by mouth daily at 12 noon.    [name] HYDROcodone-acetaminophen (NORCO/VICODIN) 5-325 MG tablet Take 1 tablet by mouth every 4 (four) hours as needed for moderate pain (Must last 30 days.  Do not take and drive a car or use machinery.). 120 tablet 0  . lisinopril (PRINIVIL,ZESTRIL) 20 MG tablet Take 20 mg by mouth daily.    . TURMERIC PO Take by mouth.     No current facility-administered medications for this visit.     Physical Exam  Blood pressure 89/63, pulse 71, temperature 97.9 F (36.6 C), resp. rate 16, height 5\' 1"  (1.549 m), weight 212 lb (96.163 kg).  Constitutional: overall normal hygiene, normal nutrition, well developed, normal grooming, normal body habitus. Assistive device:none  Musculoskeletal: gait and station Limp none, muscle tone and strength are normal, no tremors or atrophy is present.  .  Neurological: coordination overall normal.  Deep tendon reflex/nerve stretch intact.  Sensation normal.  Cranial nerves II-XII intact.   Skin:   normal overall no scars, lesions, ulcers or rashes. No psoriasis.  Psychiatric: Alert and oriented x 3.  Recent memory intact, remote memory unclear.  Normal mood and affect. Well groomed.  Good eye contact.  Cardiovascular: overall no swelling, no varicosities, no edema bilaterally, normal temperatures of the legs and arms, no clubbing, cyanosis and good capillary refill.  Lymphatic: palpation is normal.  Spine/Pelvis examination:  Inspection:  Overall, sacoiliac joint benign and hips nontender; without crepitus  or defects.   Thoracic spine inspection: Alignment normal without kyphosis present   Lumbar spine inspection:  Alignment  with normal lumbar lordosis, with scoliosis apparent.   Thoracic spine palpation:  without tenderness of spinal processes   Lumbar spine palpation: with tenderness of lumbar area; without tightness of lumbar muscles    Range of Motion:   Lumbar flexion, forward flexion  is 35  without pain or tenderness    Lumbar extension is 5  without pain or tenderness   Left lateral bend is Normal  without pain or tenderness   Right lateral bend is Normal without pain or tenderness   Straight leg raising is Abnormal- 25 degrees left   Strength & tone: Normal   Stability overall normal stability   The patient has been educated about the nature of the problem(s) and counseled on treatment options.  The patient appeared to understand what I have discussed and is in agreement with it.  Encounter Diagnoses  Name Primary?  . Bulging lumbar disc Yes  . Midline low back pain with left-sided sciatica     PLAN Call if any problems.  Precautions discussed.  Continue current medications.   Return to clinic To be seen at [facility] for evaluation for epidurals.  She has had epidural in the back when she lived in [place] and it helped her considerably.

## 2015-12-07 ENCOUNTER — Encounter: Payer: Self-pay | Admitting: *Deleted

## 2015-12-07 NOTE — Telephone Encounter (Signed)
This encounter was created in error - please disregard.

## 2015-12-08 ENCOUNTER — Ambulatory Visit (INDEPENDENT_AMBULATORY_CARE_PROVIDER_SITE_OTHER): Payer: Medicare Other

## 2015-12-08 ENCOUNTER — Ambulatory Visit (INDEPENDENT_AMBULATORY_CARE_PROVIDER_SITE_OTHER): Payer: Medicare Other | Admitting: Orthopaedic Surgery

## 2015-12-08 VITALS — BP 117/59 | HR 89 | Temp 97.3°F | Ht 61.0 in | Wt 212.0 lb

## 2015-12-08 DIAGNOSIS — M5442 Lumbago with sciatica, left side: Secondary | ICD-10-CM

## 2015-12-08 DIAGNOSIS — M25511 Pain in right shoulder: Secondary | ICD-10-CM

## 2015-12-08 MED ORDER — HYDROCODONE-ACETAMINOPHEN 7.5-325 MG PO TABS: 1.0000 | ORAL_TABLET | ORAL | Status: DC | PRN

## 2015-12-08 NOTE — Progress Notes (Signed)
Patient Annette Saunders:[id number] Abare, female DOB:07/02/1946, [name]  Chief Complaint  Patient presents with  . Shoulder Pain    Right shoulder pain    HPI  Annette Saunders is a 70 y.o. female who has history of lower back pain with left sided sciatica.  She was to have gone to PT but decided not to do so.  She is doing back exercises at home.  She still has the sciatica but it is less.  She has a new problem today:  Right shoulder pain.  She has pain with overhead use and using her hand behind her.  She has no paresthesias or trauma.  She has no swelling or redness.  She has tried ice and heat with little help.   Shoulder Pain  The pain is present in the right shoulder. This is a new problem. The current episode started 1 to 4 weeks ago. There has been no history of extremity trauma. The problem occurs daily. The problem has been gradually worsening. The quality of the pain is described as aching. The pain is at a severity of 4/10. The pain is moderate. The symptoms are aggravated by activity and cold. She has tried acetaminophen, cold, heat, NSAIDS and rest for the symptoms. The treatment provided mild relief.    Body mass index is 40.08 kg/(m^2).  Review of Systems  Constitutional:       Patient does not have diabetes Patient has hypertension Patient does not have COPD Patient does not smoke.  HENT: Negative for congestion.   Respiratory: Negative for cough and shortness of breath.   Cardiovascular: Negative for chest pain.  Musculoskeletal: Positive for myalgias and back pain.  Allergic/Immunologic: Negative for environmental allergies.  All other systems reviewed and are negative.   No past medical history on file.  No past surgical history on file.  No family history on file.  Social History Social History  Substance Use Topics  . Smoking status: Never Smoker   . Smokeless tobacco: Not on file  . Alcohol Use: Not on file    No Known Allergies  Current  Outpatient Prescriptions  Medication Sig Dispense Refill  . amLODipine (NORVASC) 2.5 MG tablet Take 2.5 mg by mouth daily.    [name] aspirin 81 MG tablet Take 81 mg by mouth daily.    [name] atorvastatin (LIPITOR) 40 MG tablet Take 40 mg by mouth daily.    [name] esomeprazole (NEXIUM) 20 MG capsule Take 20 mg by mouth daily at 12 noon.    [name] lisinopril (PRINIVIL,ZESTRIL) 20 MG tablet Take 20 mg by mouth daily.    . TURMERIC PO Take by mouth.    [name] HYDROcodone-acetaminophen (NORCO) 7.5-325 MG tablet Take 1 tablet by mouth every 4 (four) hours as needed for moderate pain (Must last 30 days.  Do not drive or operate machinery while taking this medicine.). 120 tablet 0   No current facility-administered medications for this visit.     Physical Exam  Blood pressure 117/59, pulse 89, temperature 97.3 F (36.3 C), height 5\' 1"  (1.549 m), weight 212 lb (96.163 kg).  Constitutional: overall normal hygiene, normal nutrition, well developed, normal grooming, normal body habitus. Assistive device:none  Musculoskeletal: gait and station Limp none, muscle tone and strength are normal, no tremors or atrophy is present.  .  Neurological: coordination overall normal.  Deep tendon reflex/nerve stretch intact.  Sensation normal.  Cranial nerves II-XII intact.   Skin:   normal overall no scars, lesions, ulcers or rashes. No psoriasis.  Psychiatric: Alert and oriented x 3.  Recent memory intact, remote memory unclear.  Normal mood and affect. Well groomed.  Good eye contact.  Cardiovascular: overall no swelling, no varicosities, no edema bilaterally, normal temperatures of the legs and arms, no clubbing, cyanosis and good capillary refill.  Lymphatic: palpation is normal.  Examination of right Upper Extremity is done.  Inspection:   Overall:  Elbow non-tender without crepitus or defects, forearm non-tender without crepitus or defects, wrist non-tender without crepitus or defects, hand non-tender.    Shoulder: with  glenohumeral joint tenderness, without effusion.   Upper arm: without swelling and tenderness   Range of motion:   Overall:  Full range of motion of the elbow, full range of motion of wrist and full range of motion in fingers.   Shoulder:  right  160 degrees forward flexion; 150 degrees abduction; 35 degrees internal rotation, 35 degrees external rotation, 15 degrees extension, 40 degrees adduction.   Stability:   Overall:  Shoulder, elbow and wrist stable   Strength and Tone:   Overall full shoulder muscles strength, full upper arm strength and normal upper arm bulk and tone.  The patient has been educated about the nature of the problem(s) and counseled on treatment options.  The patient appeared to understand what I have discussed and is in agreement with it.  Encounter Diagnoses  Name Primary?  . Right shoulder pain Yes  . Midline low back pain with left-sided sciatica    PROCEDURE NOTE:  The patient request injection, verbal consent was obtained.  The right shoulder was prepped appropriately after time out was performed.   Sterile technique was observed and injection of 1 cc of Depo-Medrol 40 mg with several cc's of plain xylocaine. Anesthesia was provided by ethyl chloride and a 20-gauge needle was used to inject the shoulder area. A posterior approach was used.  The injection was tolerated well.  A band aid dressing was applied.  The patient was advised to apply ice later today and tomorrow to the injection sight as needed.  I talked to her about her lower back again.  She is doing her exercises regularly.  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  Precautions were given.  Her hypertension is well controlled.  She has no distal edema.  She watches her salt intake.  PLAN Call if any problems.  Precautions discussed.  Continue current medications.   Return to clinic 1 month   Do shoulder exercises as given on sheet of  instructions.

## 2015-12-08 NOTE — Patient Instructions (Addendum)
Given shoulder exercises to do  Use ice on right shoulderShoulder Range of Motion Exercises Shoulder range of motion (ROM) exercises are designed to keep the shoulder moving freely. They are often recommended for people who have shoulder pain. MOVEMENT EXERCISE When you are able, do this exercise 5-6 days per week, or as told by your health care provider. Work toward doing 2 sets of 10 swings. Pendulum Exercise How To Do This Exercise Lying Down 1. Lie face-down on a bed with your abdomen close to the side of the bed. 2. Let your arm hang over the side of the bed. 3. Relax your shoulder, arm, and hand. 4. Slowly and gently swing your arm forward and back. Do not use your neck muscles to swing your arm. They should be relaxed. If you are struggling to swing your arm, have someone gently swing it for you. When you do this exercise for the first time, swing your arm at a 15 degree angle for 15 seconds, or swing your arm 10 times. As pain lessens over time, increase the angle of the swing to 30-45 degrees. 5. Repeat steps 1-4 with the other arm. How To Do This Exercise While Standing 1. Stand next to a sturdy chair or table and hold on to it with your hand.  Bend forward at the waist.  Bend your knees slightly.  Relax your other arm and let it hang limp.  Relax the shoulder blade of the arm that is hanging and let it drop.  While keeping your shoulder relaxed, use body motion to swing your arm in small circles. The first time you do this exercise, swing your arm for about 30 seconds or 10 times. When you do it next time, swing your arm for a little longer.  Stand up tall and relax.  Repeat steps 1-7, this time changing the direction of the circles. 2. Repeat steps 1-8 with the other arm. STRETCHING EXERCISES Do these exercises 3-4 times per day on 5-6 days per week or as told by your health care provider. Work toward holding the stretch for 20 seconds. Stretching Exercise 1 1. Lift your  arm straight out in front of you. 2. Bend your arm 90 degrees at the elbow (right angle) so your forearm goes across your body and looks like the letter "L." 3. Use your other arm to gently pull the elbow forward and across your body. 4. Repeat steps 1-3 with the other arm. Stretching Exercise 2 You will need a towel or rope for this exercise. 1. Bend one arm behind your back with the palm facing outward. 2. Hold a towel with your other hand. 3. Reach the arm that holds the towel above your head, and bend that arm at the elbow. Your wrist should be behind your neck. 4. Use your free hand to grab the free end of the towel. 5. With the higher hand, gently pull the towel up behind you. 6. With the lower hand, pull the towel down behind you. 7. Repeat steps 1-6 with the other arm. STRENGTHENING EXERCISES Do each of these exercises at four different times of day (sessions) every day or as told by your health care provider. To begin with, repeat each exercise 5 times (repetitions). Work toward doing 3 sets of 12 repetitions or as told by your health care provider. Strengthening Exercise 1 You will need a light weight for this activity. As you grow stronger, you may use a heavier weight. 1. Standing with a weight in  your hand, lift your arm straight out to the side until it is at the same height as your shoulder. 2. Bend your arm at 90 degrees so that your fingers are pointing to the ceiling. 3. Slowly raise your hand until your arm is straight up in the air. 4. Repeat steps 1-3 with the other arm. Strengthening Exercise 2 You will need a light weight for this activity. As you grow stronger, you may use a heavier weight. 1. Standing with a weight in your hand, gradually move your straight arm in an arc, starting at your side, then out in front of you, then straight up over your head. 2. Gradually move your other arm in an arc, starting at your side, then out in front of you, then straight up over  your head. 3. Repeat steps 1-2 with the other arm. Strengthening Exercise 3 You will need an elastic band for this activity. As you grow stronger, gradually increase the size of the bands or increase the number of bands that you use at one time. 1. While standing, hold an elastic band in one hand and raise that arm up in the air. 2. With your other hand, pull down the band until that hand is by your side. 3. Repeat steps 1-2 with the other arm.   This information is not intended to replace advice given to you by your health care provider. Make sure you discuss any questions you have with your health care provider.   Document Released: 05/14/2003 Document Revised: 12/30/2014 Document Reviewed: 08/11/2014 Elsevier Interactive Patient Education [employer].

## 2015-12-16 DIAGNOSIS — Z6837 Body mass index (BMI) 37.0-37.9, adult: Secondary | ICD-10-CM | POA: Diagnosis not present

## 2015-12-16 DIAGNOSIS — E669 Obesity, unspecified: Secondary | ICD-10-CM | POA: Diagnosis not present

## 2015-12-16 DIAGNOSIS — I519 Heart disease, unspecified: Secondary | ICD-10-CM | POA: Diagnosis not present

## 2015-12-22 ENCOUNTER — Telehealth: Payer: Self-pay | Admitting: *Deleted

## 2015-12-22 NOTE — Telephone Encounter (Signed)
Patient is scheduled for 01/20/16 at 2:00 pm at [facility]. They will call patient.

## 2016-01-05 ENCOUNTER — Encounter: Payer: Self-pay | Admitting: Orthopaedic Surgery

## 2016-01-05 ENCOUNTER — Telehealth: Payer: Self-pay | Admitting: Orthopaedic Surgery

## 2016-01-05 ENCOUNTER — Ambulatory Visit (INDEPENDENT_AMBULATORY_CARE_PROVIDER_SITE_OTHER): Payer: Medicare Other | Admitting: Orthopaedic Surgery

## 2016-01-05 VITALS — BP 117/87 | HR 82 | Temp 97.7°F | Resp 16 | Ht 61.0 in | Wt 213.0 lb

## 2016-01-05 DIAGNOSIS — M25511 Pain in right shoulder: Secondary | ICD-10-CM | POA: Diagnosis not present

## 2016-01-05 MED ORDER — HYDROCODONE-ACETAMINOPHEN 7.5-325 MG PO TABS: 1.0000 | ORAL_TABLET | ORAL | Status: DC | PRN

## 2016-01-05 NOTE — Telephone Encounter (Signed)
Left message to return my call.  

## 2016-01-05 NOTE — Telephone Encounter (Signed)
Annette Saunders called asking about the shot she got this morning. She has questions about how long will it hurt, because she  says it hurts worser now.  Please advise

## 2016-01-05 NOTE — Progress Notes (Signed)
Patient ID: Annette Saunders, female   DOB: 05-31-1946, 70 y.o.   MRN: [id number]  CC:  My shoulder is still hurting  She went to OT/PT and is doing her exercises.  She had a good response to the right shoulder injection last time but it has worn off.  She is taking her medicine.  She has no new trauma, no paresthesias.  ROM is full if you give her a few seconds to fully raise her arm up but it hurts past 90 abduction.  NV is intact. Grips are normal. She has no paresthesias.  Encounter Diagnosis  Name Primary?  . Right shoulder pain Yes    PROCEDURE NOTE:  The patient request injection, verbal consent was obtained.  The right shoulder was prepped appropriately after time out was performed.   Sterile technique was observed and injection of 1 cc of Depo-Medrol 40 mg with several cc's of plain xylocaine. Anesthesia was provided by ethyl chloride and a 20-gauge needle was used to inject the shoulder area. A posterior approach was used.  The injection was tolerated well.  A band aid dressing was applied.  The patient was advised to apply ice later today and tomorrow to the injection sight as needed.  Return in three weeks.  Call if any problem.  Continue OT.

## 2016-01-06 DIAGNOSIS — E785 Hyperlipidemia, unspecified: Secondary | ICD-10-CM | POA: Diagnosis not present

## 2016-01-06 DIAGNOSIS — R739 Hyperglycemia, unspecified: Secondary | ICD-10-CM | POA: Diagnosis not present

## 2016-01-06 DIAGNOSIS — I10 Essential (primary) hypertension: Secondary | ICD-10-CM | POA: Diagnosis not present

## 2016-01-06 DIAGNOSIS — E559 Vitamin D deficiency, unspecified: Secondary | ICD-10-CM | POA: Diagnosis not present

## 2016-01-06 DIAGNOSIS — Z6839 Body mass index (BMI) 39.0-39.9, adult: Secondary | ICD-10-CM | POA: Diagnosis not present

## 2016-01-20 DIAGNOSIS — M5416 Radiculopathy, lumbar region: Secondary | ICD-10-CM | POA: Diagnosis not present

## 2016-01-20 DIAGNOSIS — M5136 Other intervertebral disc degeneration, lumbar region: Secondary | ICD-10-CM | POA: Diagnosis not present

## 2016-01-20 DIAGNOSIS — I519 Heart disease, unspecified: Secondary | ICD-10-CM | POA: Diagnosis not present

## 2016-01-20 DIAGNOSIS — I119 Hypertensive heart disease without heart failure: Secondary | ICD-10-CM | POA: Diagnosis not present

## 2016-01-20 DIAGNOSIS — R0600 Dyspnea, unspecified: Secondary | ICD-10-CM | POA: Diagnosis not present

## 2016-01-27 ENCOUNTER — Ambulatory Visit (INDEPENDENT_AMBULATORY_CARE_PROVIDER_SITE_OTHER): Payer: Medicare Other | Admitting: Orthopaedic Surgery

## 2016-01-27 ENCOUNTER — Encounter: Payer: Self-pay | Admitting: Orthopaedic Surgery

## 2016-01-27 VITALS — BP 120/74 | HR 69 | Temp 97.3°F | Ht 61.0 in | Wt 213.4 lb

## 2016-01-27 DIAGNOSIS — M5126 Other intervertebral disc displacement, lumbar region: Secondary | ICD-10-CM

## 2016-01-27 DIAGNOSIS — M5136 Other intervertebral disc degeneration, lumbar region: Secondary | ICD-10-CM

## 2016-01-27 DIAGNOSIS — M5442 Lumbago with sciatica, left side: Secondary | ICD-10-CM

## 2016-01-27 DIAGNOSIS — M25511 Pain in right shoulder: Secondary | ICD-10-CM | POA: Diagnosis not present

## 2016-01-27 NOTE — Progress Notes (Signed)
[name], female DOB:[birth year], 70 y.o. [id number]  Chief Complaint  Patient presents with  . Follow-up    Right Shoulder    HPI  Annette Saunders is a 70 y.o. female who has right shoulder pain and right upper arm pain.  She has been doing her exercises and using BioFreeze on the right lateral shoulder.  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 She has no arm paresthesias.  She has no redness.   She also has lower back pain and is scheduled to have epidural again next week.  She is better after the first one.   HPI  Body mass index is 40.34 kg/(m^2).  ROS  Review of Systems  Constitutional:       Patient does not have diabetes Patient has hypertension Patient does not have COPD Patient does not smoke.  HENT: Negative for congestion.   Respiratory: Negative for cough and shortness of breath.   Cardiovascular: Negative for chest pain.  Musculoskeletal: Positive for myalgias and back pain.  Allergic/Immunologic: Negative for environmental allergies.  All other systems reviewed and are negative.   History reviewed. No pertinent past medical history.  History reviewed. No pertinent past surgical history.  History reviewed. No pertinent family history.  Social History Social History  Substance Use Topics  . Smoking status: Never Smoker   . Smokeless tobacco: None  . Alcohol Use: None    No Known Allergies  Current Outpatient Prescriptions  Medication Sig Dispense Refill  . amLODipine (NORVASC) 2.5 MG tablet Take 2.5 mg by mouth daily.    [name] aspirin 81 MG tablet Take 81 mg by mouth daily.    [name] atorvastatin (LIPITOR) 40 MG tablet Take 40 mg by mouth daily.    [name] esomeprazole (NEXIUM) 20 MG capsule Take 20 mg by mouth daily at 12 noon.    [name] HYDROcodone-acetaminophen (NORCO) 7.5-325 MG tablet Take 1 tablet by mouth every 4 (four) hours as needed for moderate pain (Must last 30 days.  Do not drive or operate machinery  while taking this medicine.). 120 tablet 0  . lisinopril (PRINIVIL,ZESTRIL) 20 MG tablet Take 20 mg by mouth daily.    . TURMERIC PO Take by mouth.     No current facility-administered medications for this visit.     Physical Exam  Blood pressure 120/74, pulse 69, temperature 97.3 F (36.3 C), height 5\' 1"  (1.549 m), weight 213 lb 6.4 oz (96.798 kg).  Constitutional: overall normal hygiene, normal nutrition, well developed, normal grooming, normal body habitus. Assistive device:none  Musculoskeletal: gait and station Limp none, muscle tone and strength are normal, no tremors or atrophy is present.  .  Neurological: coordination overall normal.  Deep tendon reflex/nerve stretch intact.  Sensation normal.  Cranial nerves II-XII intact.   Skin:   normal overall no scars, lesions, ulcers or rashes. No psoriasis.  Psychiatric: Alert and oriented x 3.  Recent memory intact, remote memory unclear.  Normal mood and affect. Well groomed.  Good eye contact.  Cardiovascular: overall no swelling, no varicosities, no edema bilaterally, normal temperatures of the legs and arms, no clubbing, cyanosis and good capillary refill.  Lymphatic: palpation is normal.  Examination of left Upper Extremity is done.  Inspection:   Overall:  Elbow non-tender without crepitus or defects, forearm non-tender without crepitus or defects, wrist non-tender without crepitus or defects, hand non-tender.    Shoulder: with glenohumeral joint tenderness, without effusion.   Upper arm: without  swelling and tenderness   Range of motion:   Overall:  Full range of motion of the elbow, full range of motion of wrist and full range of motion in fingers.   Shoulder:  right  180 degrees forward flexion; 160 degrees abduction; 40 degrees internal rotation, 40 degrees external rotation, 20 degrees extension, 40 degrees adduction.   Stability:   Overall:  Shoulder, elbow and wrist stable   Strength and Tone:   Overall full  shoulder muscles strength, full upper arm strength and normal upper arm bulk and tone.   The patient has been educated about the nature of the problem(s) and counseled on treatment options.  The patient appeared to understand what I have discussed and is in agreement with it.  Encounter Diagnoses  Name Primary?  . Right shoulder pain Yes  . Midline low back pain with left-sided sciatica   . Bulging lumbar disc     PLAN Call if any problems.  Precautions discussed.  Continue current medications.   Return to clinic 6 weeks

## 2016-01-27 NOTE — Patient Instructions (Signed)
Recommended exercises to be done in a pool if possible.  Continue medications.

## 2016-02-01 DIAGNOSIS — N390 Urinary tract infection, site not specified: Secondary | ICD-10-CM | POA: Diagnosis not present

## 2016-02-04 DIAGNOSIS — M5136 Other intervertebral disc degeneration, lumbar region: Secondary | ICD-10-CM | POA: Diagnosis not present

## 2016-02-04 DIAGNOSIS — M5416 Radiculopathy, lumbar region: Secondary | ICD-10-CM | POA: Diagnosis not present

## 2016-02-11 ENCOUNTER — Ambulatory Visit (INDEPENDENT_AMBULATORY_CARE_PROVIDER_SITE_OTHER): Payer: Medicare Other | Admitting: Orthopaedic Surgery

## 2016-02-11 ENCOUNTER — Encounter: Payer: Self-pay | Admitting: Orthopaedic Surgery

## 2016-02-11 VITALS — BP 118/79 | HR 80 | Temp 97.7°F | Ht 61.0 in | Wt 215.4 lb

## 2016-02-11 DIAGNOSIS — M25511 Pain in right shoulder: Secondary | ICD-10-CM

## 2016-02-11 NOTE — Progress Notes (Signed)
CC:  My right shoulder is hurting more.  She has recurrent pain of her right shoulder.  She has no swelling or redness or paresthesias.  It hurts with overhead use.  Encounter Diagnosis  Name Primary?  . Right shoulder pain Yes    PROCEDURE NOTE:  The patient request injection, verbal consent was obtained.  The right shoulder was prepped appropriately after time out was performed.   Sterile technique was observed and injection of 1 cc of Depo-Medrol 40 mg with several cc's of plain xylocaine. Anesthesia was provided by ethyl chloride and a 20-gauge needle was used to inject the shoulder area. A posterior approach was used.  The injection was tolerated well.  A band aid dressing was applied.  The patient was advised to apply ice later today and tomorrow to the injection sight as needed.  Keep regular appointment or see in one month whichever is best for her.  Call if any problem.  Precautions discussed.  Electronically Signed Sanjuana Kava, MD 6/15/201710:29 PM

## 2016-02-11 NOTE — Patient Instructions (Signed)
Keep regularly scheduled apt.

## 2016-02-18 DIAGNOSIS — R5383 Other fatigue: Secondary | ICD-10-CM | POA: Diagnosis not present

## 2016-02-18 DIAGNOSIS — I119 Hypertensive heart disease without heart failure: Secondary | ICD-10-CM | POA: Diagnosis not present

## 2016-02-18 DIAGNOSIS — E785 Hyperlipidemia, unspecified: Secondary | ICD-10-CM | POA: Diagnosis not present

## 2016-02-18 DIAGNOSIS — E559 Vitamin D deficiency, unspecified: Secondary | ICD-10-CM | POA: Diagnosis not present

## 2016-02-18 DIAGNOSIS — R739 Hyperglycemia, unspecified: Secondary | ICD-10-CM | POA: Diagnosis not present

## 2016-02-18 DIAGNOSIS — Z23 Encounter for immunization: Secondary | ICD-10-CM | POA: Diagnosis not present

## 2016-02-25 DIAGNOSIS — I10 Essential (primary) hypertension: Secondary | ICD-10-CM | POA: Diagnosis not present

## 2016-02-25 DIAGNOSIS — Z6841 Body Mass Index (BMI) 40.0 and over, adult: Secondary | ICD-10-CM | POA: Diagnosis not present

## 2016-03-11 DIAGNOSIS — M159 Polyosteoarthritis, unspecified: Secondary | ICD-10-CM | POA: Diagnosis not present

## 2016-03-11 DIAGNOSIS — I119 Hypertensive heart disease without heart failure: Secondary | ICD-10-CM | POA: Diagnosis not present

## 2016-03-11 DIAGNOSIS — E785 Hyperlipidemia, unspecified: Secondary | ICD-10-CM | POA: Diagnosis not present

## 2016-03-21 DIAGNOSIS — I10 Essential (primary) hypertension: Secondary | ICD-10-CM | POA: Diagnosis not present

## 2016-03-21 DIAGNOSIS — Z6841 Body Mass Index (BMI) 40.0 and over, adult: Secondary | ICD-10-CM | POA: Diagnosis not present

## 2016-03-21 DIAGNOSIS — I519 Heart disease, unspecified: Secondary | ICD-10-CM | POA: Diagnosis not present

## 2016-03-24 ENCOUNTER — Ambulatory Visit (INDEPENDENT_AMBULATORY_CARE_PROVIDER_SITE_OTHER): Payer: Medicare Other | Admitting: Orthopaedic Surgery

## 2016-03-24 ENCOUNTER — Encounter: Payer: Self-pay | Admitting: Orthopaedic Surgery

## 2016-03-24 VITALS — BP 141/78 | HR 71 | Temp 97.0°F | Ht 61.0 in | Wt 220.4 lb

## 2016-03-24 DIAGNOSIS — M25511 Pain in right shoulder: Secondary | ICD-10-CM

## 2016-03-24 NOTE — Progress Notes (Signed)
[name], female DOB:[birth year], 70 y.o. [id number]  Chief Complaint  Patient presents with  . Follow-up    Right Shoulder    HPI  Annette Saunders is a 70 y.o. female who has right shoulder pain. Her pain is less.  But she still has pain with overhead use and some pain over the lateral deltoid area.  She is going to join the [facility] and begin exercises there.  She is looking forward to it. She has no trauma, no paresthesias. HPI  Body mass index is 41.64 kg/m.  ROS  Review of Systems  Constitutional:       Patient does not have diabetes Patient has hypertension Patient does not have COPD Patient does not smoke.  HENT: Negative for congestion.   Respiratory: Negative for cough and shortness of breath.   Cardiovascular: Negative for chest pain.  Musculoskeletal: Positive for back pain and myalgias.  Allergic/Immunologic: Negative for environmental allergies.  All other systems reviewed and are negative.   History reviewed. No pertinent past medical history.  History reviewed. No pertinent surgical history.  History reviewed. No pertinent family history.  Social History Social History  Substance Use Topics  . Smoking status: Never Smoker  . Smokeless tobacco: Never Used  . Alcohol use Not on file    No Known Allergies  Current Outpatient Prescriptions  Medication Sig Dispense Refill  . amLODipine (NORVASC) 2.5 MG tablet Take 2.5 mg by mouth daily.    [name] aspirin 81 MG tablet Take 81 mg by mouth daily.    [name] atorvastatin (LIPITOR) 40 MG tablet Take 40 mg by mouth daily.    [name] esomeprazole (NEXIUM) 20 MG capsule Take 20 mg by mouth daily at 12 noon.    [name] HYDROcodone-acetaminophen (NORCO) 7.5-325 MG tablet Take 1 tablet by mouth every 4 (four) hours as needed for moderate pain (Must last 30 days.  Do not drive or operate machinery while taking this medicine.). 120 tablet 0  . lisinopril (PRINIVIL,ZESTRIL) 20 MG tablet Take 20 mg by mouth daily.    .  TURMERIC PO Take by mouth.     No current facility-administered medications for this visit.      Physical Exam  Blood pressure (!) 141/78, pulse 71, temperature 97 F (36.1 C), height 5\' 1"  (1.549 m), weight 220 lb 6.4 oz (100 kg).  Constitutional: overall normal hygiene, normal nutrition, well developed, normal grooming, normal body habitus. Assistive device:none  Musculoskeletal: gait and station Limp none, muscle tone and strength are normal, no tremors or atrophy is present.  .  Neurological: coordination overall normal.  Deep tendon reflex/nerve stretch intact.  Sensation normal.  Cranial nerves II-XII intact.   Skin:   normal overall no scars, lesions, ulcers or rashes. No psoriasis.  Psychiatric: Alert and oriented x 3.  Recent memory intact, remote memory unclear.  Normal mood and affect. Well groomed.  Good eye contact.  Cardiovascular: overall no swelling, no varicosities, no edema bilaterally, normal temperatures of the legs and arms, no clubbing, cyanosis and good capillary refill.  Lymphatic: palpation is normal.  Examination of right Upper Extremity is done.  Inspection:   Overall:  Elbow non-tender without crepitus or defects, forearm non-tender without crepitus or defects, wrist non-tender without crepitus or defects, hand non-tender.    Shoulder: with glenohumeral joint tenderness, without effusion.   Upper arm: without swelling and tenderness   Range of motion:   Overall:  Full range of motion of the elbow, full range of motion of wrist  and full range of motion in fingers.   Shoulder:  right  150 degrees forward flexion; 120 degrees abduction; 35 degrees internal rotation, 35 degrees external rotation, 10 degrees extension, 40 degrees adduction.   Stability:   Overall:  Shoulder, elbow and wrist stable   Strength and Tone:   Overall full shoulder muscles strength, full upper arm strength and normal upper arm bulk and tone.   The patient has been educated  about the nature of the problem(s) and counseled on treatment options.  The patient appeared to understand what I have discussed and is in agreement with it.  Encounter Diagnosis  Name Primary?  . Right shoulder pain Yes    PLAN Call if any problems.  Precautions discussed.  Continue current medications.   Return to clinic 6 weeks   Electronically Signed Sanjuana Kava, MD 7/27/20179:10 AM

## 2016-04-05 DIAGNOSIS — M5136 Other intervertebral disc degeneration, lumbar region: Secondary | ICD-10-CM | POA: Diagnosis not present

## 2016-04-05 DIAGNOSIS — M5416 Radiculopathy, lumbar region: Secondary | ICD-10-CM | POA: Diagnosis not present

## 2016-04-05 DIAGNOSIS — Z6841 Body Mass Index (BMI) 40.0 and over, adult: Secondary | ICD-10-CM | POA: Diagnosis not present

## 2016-04-06 DIAGNOSIS — F419 Anxiety disorder, unspecified: Secondary | ICD-10-CM | POA: Diagnosis not present

## 2016-04-06 DIAGNOSIS — R5383 Other fatigue: Secondary | ICD-10-CM | POA: Diagnosis not present

## 2016-04-18 DIAGNOSIS — R739 Hyperglycemia, unspecified: Secondary | ICD-10-CM | POA: Diagnosis not present

## 2016-04-18 DIAGNOSIS — I119 Hypertensive heart disease without heart failure: Secondary | ICD-10-CM | POA: Diagnosis not present

## 2016-05-05 ENCOUNTER — Ambulatory Visit (INDEPENDENT_AMBULATORY_CARE_PROVIDER_SITE_OTHER): Payer: Medicare Other | Admitting: Orthopaedic Surgery

## 2016-05-05 ENCOUNTER — Encounter: Payer: Self-pay | Admitting: Orthopaedic Surgery

## 2016-05-05 VITALS — BP 115/67 | HR 98 | Temp 97.7°F | Ht 60.0 in | Wt 229.0 lb

## 2016-05-05 DIAGNOSIS — M25511 Pain in right shoulder: Secondary | ICD-10-CM | POA: Diagnosis not present

## 2016-05-05 NOTE — Progress Notes (Signed)
[name], female DOB:1946-07-22, [name]  Chief Complaint  Patient presents with  . Follow-up    Right shoulder    HPI  Annette Saunders is a 70 y.o. female who has right shoulder pain.  She went to the [facility] but could not stand the chlorine smell.  She is doing exercises at home. She has more pain in the shoulder now.  It hurts at night. She has no redness, no numbness.  She requests an injection. HPI  Body mass index is 44.72 kg/m.  ROS  Review of Systems  Constitutional:       Patient does not have diabetes Patient has hypertension Patient does not have COPD Patient does not smoke.  HENT: Negative for congestion.   Respiratory: Negative for cough and shortness of breath.   Cardiovascular: Negative for chest pain.  Musculoskeletal: Positive for back pain and myalgias.  Allergic/Immunologic: Negative for environmental allergies.  All other systems reviewed and are negative.   History reviewed. No pertinent past medical history.  History reviewed. No pertinent surgical history.  History reviewed. No pertinent family history.  Social History Social History  Substance Use Topics  . Smoking status: Never Smoker  . Smokeless tobacco: Never Used  . Alcohol use Not on file    No Known Allergies  Current Outpatient Prescriptions  Medication Sig Dispense Refill  . amLODipine (NORVASC) 2.5 MG tablet Take 2.5 mg by mouth daily.    [name] aspirin 81 MG tablet Take 81 mg by mouth daily.    [name] atorvastatin (LIPITOR) 40 MG tablet Take 40 mg by mouth daily.    [name] esomeprazole (NEXIUM) 20 MG capsule Take 20 mg by mouth daily at 12 noon.    [name] HYDROcodone-acetaminophen (NORCO) 7.5-325 MG tablet Take 1 tablet by mouth every 4 (four) hours as needed for moderate pain (Must last 30 days.  Do not drive or operate machinery while taking this medicine.). 120 tablet 0  . lisinopril (PRINIVIL,ZESTRIL) 20 MG tablet Take 20 mg by mouth daily.    . TURMERIC PO Take by  mouth.     No current facility-administered medications for this visit.      Physical Exam  Blood pressure 115/67, pulse 98, temperature 97.7 F (36.5 C), height 5' (1.524 m), weight 229 lb (103.9 kg).  Constitutional: overall normal hygiene, normal nutrition, well developed, normal grooming, normal body habitus. Assistive device:none  Musculoskeletal: gait and station Limp none, muscle tone and strength are normal, no tremors or atrophy is present.  .  Neurological: coordination overall normal.  Deep tendon reflex/nerve stretch intact.  Sensation normal.  Cranial nerves II-XII intact.   Skin:   Normal overall no scars, lesions, ulcers or rashes. No psoriasis.  Psychiatric: Alert and oriented x 3.  Recent memory intact, remote memory unclear.  Normal mood and affect. Well groomed.  Good eye contact.  Cardiovascular: overall no swelling, no varicosities, no edema bilaterally, normal temperatures of the legs and arms, no clubbing, cyanosis and good capillary refill.  Lymphatic: palpation is normal.  Examination of right Upper Extremity is done.  Inspection:   Overall:  Elbow non-tender without crepitus or defects, forearm non-tender without crepitus or defects, wrist non-tender without crepitus or defects, hand non-tender.    Shoulder: with glenohumeral joint tenderness, without effusion.   Upper arm: with swelling and tenderness   Range of motion:   Overall:  Full range of motion of the elbow, full range of motion of wrist and full range of motion in  fingers.   Shoulder:  right  180 degrees forward flexion; 165 degrees abduction; 35 degrees internal rotation, 35 degrees external rotation, 15 degrees extension, 40 degrees adduction.   Stability:   Overall:  Shoulder, elbow and wrist stable   Strength and Tone:   Overall full shoulder muscles strength, full upper arm strength and normal upper arm bulk and tone.   The patient has been educated about the nature of the problem(s)  and counseled on treatment options.  The patient appeared to understand what I have discussed and is in agreement with it.  Encounter Diagnosis  Name Primary?  . Right shoulder pain Yes   PROCEDURE NOTE:  The patient request injection, verbal consent was obtained.  The right shoulder was prepped appropriately after time out was performed.   Sterile technique was observed and injection of 1 cc of Depo-Medrol 40 mg with several cc's of plain xylocaine. Anesthesia was provided by ethyl chloride and a 20-gauge needle was used to inject the shoulder area. A posterior approach was used.  The injection was tolerated well.  A band aid dressing was applied.  The patient was advised to apply ice later today and tomorrow to the injection sight as needed.   PLAN Call if any problems.  Precautions discussed.  Continue current medications.   Return to clinic 6 weeks   Electronically Signed Sanjuana Kava, MD 9/7/20173:22 PM

## 2016-05-19 ENCOUNTER — Ambulatory Visit (HOSPITAL_COMMUNITY): Payer: Self-pay | Admitting: Psychiatry

## 2016-06-16 ENCOUNTER — Encounter: Payer: Self-pay | Admitting: Orthopaedic Surgery

## 2016-06-16 ENCOUNTER — Ambulatory Visit (INDEPENDENT_AMBULATORY_CARE_PROVIDER_SITE_OTHER): Payer: Medicare Other | Admitting: Orthopaedic Surgery

## 2016-06-16 VITALS — BP 121/72 | HR 80 | Temp 99.7°F | Ht 60.5 in | Wt 236.0 lb

## 2016-06-16 DIAGNOSIS — M25511 Pain in right shoulder: Secondary | ICD-10-CM

## 2016-06-16 DIAGNOSIS — G8929 Other chronic pain: Secondary | ICD-10-CM

## 2016-06-16 NOTE — Progress Notes (Signed)
CC:  My shoulder is hurting again  Her right shoulder is bothering her again.  The injection in early September helped significantly but it is hurting again with overhead use.  ROM is full but tender in the extremes.  NV intact.  Encounter Diagnosis  Name Primary?  . Chronic right shoulder pain Yes   PROCEDURE NOTE:  The patient request injection, verbal consent was obtained.  The right shoulder was prepped appropriately after time out was performed.   Sterile technique was observed and injection of 1 cc of Depo-Medrol 40 mg with several cc's of plain xylocaine. Anesthesia was provided by ethyl chloride and a 20-gauge needle was used to inject the shoulder area. A posterior approach was used.  The injection was tolerated well.  A band aid dressing was applied.  The patient was advised to apply ice later today and tomorrow to the injection sight as needed.  Return as needed.  Electronically Signed Sanjuana Kava, MD 10/19/20172:27 PM

## 2016-06-23 ENCOUNTER — Emergency Department (HOSPITAL_COMMUNITY)
Admission: EM | Admit: 2016-06-23 | Discharge: 2016-06-23 | Disposition: A | Discharge status: Home / Self Care | Payer: Medicare Other | Attending: Emergency Medicine | Admitting: Emergency Medicine

## 2016-06-23 ENCOUNTER — Emergency Department (HOSPITAL_COMMUNITY): Payer: Medicare Other

## 2016-06-23 ENCOUNTER — Encounter (HOSPITAL_COMMUNITY): Payer: Self-pay | Admitting: Emergency Medicine

## 2016-06-23 DIAGNOSIS — I10 Essential (primary) hypertension: Secondary | ICD-10-CM | POA: Insufficient documentation

## 2016-06-23 DIAGNOSIS — S42291A Other displaced fracture of upper end of right humerus, initial encounter for closed fracture: Secondary | ICD-10-CM | POA: Diagnosis not present

## 2016-06-23 DIAGNOSIS — M79601 Pain in right arm: Secondary | ICD-10-CM | POA: Diagnosis not present

## 2016-06-23 DIAGNOSIS — W182XXA Fall in (into) shower or empty bathtub, initial encounter: Secondary | ICD-10-CM | POA: Diagnosis not present

## 2016-06-23 DIAGNOSIS — Y929 Unspecified place or not applicable: Secondary | ICD-10-CM | POA: Diagnosis not present

## 2016-06-23 DIAGNOSIS — Z7982 Long term (current) use of aspirin: Secondary | ICD-10-CM | POA: Diagnosis not present

## 2016-06-23 DIAGNOSIS — S42211A Unspecified displaced fracture of surgical neck of right humerus, initial encounter for closed fracture: Secondary | ICD-10-CM | POA: Insufficient documentation

## 2016-06-23 DIAGNOSIS — Y9389 Activity, other specified: Secondary | ICD-10-CM | POA: Insufficient documentation

## 2016-06-23 DIAGNOSIS — S4291XA Fracture of right shoulder girdle, part unspecified, initial encounter for closed fracture: Secondary | ICD-10-CM

## 2016-06-23 DIAGNOSIS — Z79899 Other long term (current) drug therapy: Secondary | ICD-10-CM | POA: Diagnosis not present

## 2016-06-23 DIAGNOSIS — Y999 Unspecified external cause status: Secondary | ICD-10-CM | POA: Diagnosis not present

## 2016-06-23 DIAGNOSIS — S99929A Unspecified injury of unspecified foot, initial encounter: Secondary | ICD-10-CM | POA: Diagnosis not present

## 2016-06-23 DIAGNOSIS — S4991XA Unspecified injury of right shoulder and upper arm, initial encounter: Secondary | ICD-10-CM | POA: Diagnosis present

## 2016-06-23 HISTORY — DX: Essential (primary) hypertension: I10

## 2016-06-23 HISTORY — DX: Anxiety disorder, unspecified: F41.9

## 2016-06-23 MED ORDER — HYDROMORPHONE HCL 1 MG/ML IJ SOLN
1.0000 mg | Freq: Once | INTRAMUSCULAR | Status: AC
  Administered 2016-06-23: 1 mg via INTRAMUSCULAR
  Filled 2016-06-23: qty 1

## 2016-06-23 MED ORDER — MORPHINE SULFATE (PF) 4 MG/ML IV SOLN
6.0000 mg | Freq: Once | INTRAVENOUS | Status: AC
  Administered 2016-06-23: 6 mg via INTRAMUSCULAR
  Filled 2016-06-23: qty 2

## 2016-06-23 MED ORDER — OXYCODONE-ACETAMINOPHEN 5-325 MG PO TABS: 1.0000 | ORAL_TABLET | Freq: Four times a day (QID) | ORAL | 0 refills | Status: DC | PRN

## 2016-06-23 NOTE — ED Provider Notes (Signed)
[facility] [place] DEPT Provider Note   CSN: [id number] Arrival date & time: 06/23/16  1301     History   Chief Complaint Chief Complaint  Patient presents with  . Fall    R arm pain    HPI Annette Saunders is a 70 y.o. female.  Patient states that she fell against a wall and hurt her right shoulder.   The history is provided by the patient. No language interpreter was used.  Fall  This is a new problem. The current episode started 3 to 5 hours ago. The problem occurs rarely. The problem has not changed since onset.Pertinent negatives include no chest pain, no abdominal pain and no headaches. Exacerbated by: Movement. Nothing relieves the symptoms. She has tried nothing for the symptoms.    Past Medical History:  Diagnosis Date  . Anxiety   . High cholesterol   . Hypertension     There are no active problems to display for this patient.   Past Surgical History:  Procedure Laterality Date  . ELBOW FRACTURE SURGERY    . JOINT REPLACEMENT     bilateral knee  . TONSILLECTOMY      OB History    No data available       Home Medications    Prior to Admission medications   Medication Sig Start Date End Date Taking? Authorizing Provider  amLODipine (NORVASC) 2.5 MG tablet Take 2.5 mg by mouth daily.   Yes Historical Provider, MD  aspirin 81 MG tablet Take 81 mg by mouth daily.   Yes Historical Provider, MD  atorvastatin (LIPITOR) 40 MG tablet Take 40 mg by mouth daily.   Yes Historical Provider, MD  Cholecalciferol (VITAMIN D) 2000 units CAPS Take 6,000 Units by mouth daily.   Yes Historical Provider, MD  escitalopram (LEXAPRO) 10 MG tablet Take 10 mg by mouth daily.   Yes Historical Provider, MD  esomeprazole (NEXIUM) 20 MG capsule Take 20 mg by mouth daily at 12 noon.   Yes Historical Provider, MD  gabapentin (NEURONTIN) 100 MG capsule Take 100 mg by mouth 3 (three) times daily. On titration schedule. Increase to a max of 3 capsules 3 times a day   Yes Historical  Provider, MD  lisinopril (PRINIVIL,ZESTRIL) 20 MG tablet Take 20 mg by mouth daily.   Yes Historical Provider, MD  HYDROcodone-acetaminophen (NORCO) 7.5-325 MG tablet Take 1 tablet by mouth every 4 (four) hours as needed for moderate pain (Must last 30 days.  Do not drive or operate machinery while taking this medicine.). 01/05/16   Sanjuana Kava, MD  oxyCODONE-acetaminophen (PERCOCET/ROXICET) 5-325 MG tablet Take 1 tablet by mouth every 6 (six) hours as needed. 06/23/16   Milton Ferguson, MD    Family History History reviewed. No pertinent family history.  Social History Social History  Substance Use Topics  . Smoking status: Never Smoker  . Smokeless tobacco: Never Used  . Alcohol use No     Allergies   Review of patient's allergies indicates no known allergies.   Review of Systems Review of Systems  Constitutional: Negative for appetite change and fatigue.  HENT: Negative for congestion, ear discharge and sinus pressure.   Eyes: Negative for discharge.  Respiratory: Negative for cough.   Cardiovascular: Negative for chest pain.  Gastrointestinal: Negative for abdominal pain and diarrhea.  Genitourinary: Negative for frequency and hematuria.  Musculoskeletal: Negative for back pain.       Right shoulder pain  Skin: Negative for rash.  Neurological: Negative for seizures and  headaches.  Psychiatric/Behavioral: Negative for hallucinations.     Physical Exam Updated Vital Signs BP 114/81   Pulse 100   Temp 98 F (36.7 C) (Oral)   Resp 22   Ht 5\' 7"  (1.702 m)   Wt 282 lb (127.9 kg)   SpO2 100%   BMI 44.17 kg/m   Physical Exam  Constitutional: She is oriented to person, place, and time. She appears well-developed.  HENT:  Head: Normocephalic.  Eyes: Conjunctivae are normal.  Neck: No tracheal deviation present.  Cardiovascular:  No murmur heard. Musculoskeletal: Normal range of motion.  Tender midshaft right humerus. Neurovascular exam normal  Neurological: She  is oriented to person, place, and time.  Skin: Skin is warm.  Psychiatric: She has a normal mood and affect.     ED Treatments / Results  Labs (all labs ordered are listed, but only abnormal results are displayed) Labs Reviewed - No data to display  EKG  EKG Interpretation None       Radiology Dg Humerus Right  Result Date: 06/23/2016 CLINICAL DATA:  Right shoulder and humeral pain after fall from shower. EXAM: RIGHT HUMERUS - 2+ VIEW COMPARISON:  None. FINDINGS: There is an acute, closed, right surgical neck and humeral head fracture without significant angulation or displacement. Hill-Sachs deformity of the humeral head is suggested on the lateral view. No glenohumeral joint dislocation. Plate and screw fixation of old proximal ulnar fracture at the elbow. AC joint is maintained. IMPRESSION: Acute, closed, Neer category 1 part fracture of the humeral head and surgical neck without significant displacement or angulation. Electronically Signed   By: Ashley Royalty M.D.   On: 06/23/2016 13:55    Procedures Procedures (including critical care time)  Medications Ordered in ED Medications  morphine 4 MG/ML injection 6 mg (not administered)  HYDROmorphone (DILAUDID) injection 1 mg (1 mg Intramuscular Given 06/23/16 1321)     Initial Impression / Assessment and Plan / ED Course  I have reviewed the triage vital signs and the nursing notes.  Pertinent labs & imaging results that were available during my care of the patient were reviewed by me and considered in my medical decision making (see chart for details).  Clinical Course    X-ray shows patient with humeral head fracture no dislocation. Patient will be put in a shoulder immobilizer given pain medicine and will follow-up with her orthopedic  Final Clinical Impressions(s) / ED Diagnoses   Final diagnoses:  Shoulder fracture, right, closed, initial encounter    New Prescriptions New Prescriptions    OXYCODONE-ACETAMINOPHEN (PERCOCET/ROXICET) 5-325 MG TABLET    Take 1 tablet by mouth every 6 (six) hours as needed.     Milton Ferguson, MD 06/23/16 [phone]

## 2016-06-23 NOTE — ED Triage Notes (Signed)
Pt fell from standing position and fell onto R arm. States R arm pain, reports she heard a pop.

## 2016-06-23 NOTE — Discharge Instructions (Signed)
Follow up with dr. Luna Glasgow next week

## 2016-06-24 ENCOUNTER — Telehealth: Payer: Self-pay | Admitting: Orthopaedic Surgery

## 2016-06-24 NOTE — Telephone Encounter (Signed)
Patient came to office late afternoon 06/23/16 immediately following Emergency room visit for fracture of humerus.  Requested to wait for appointment with Dr Luna Glasgow, when he is in office 06/28/16.  Scheduled per request.  Today, 06/24/16, 12:05pm, patient called to relay she is in pain.  Please review and advise.

## 2016-06-27 MED ORDER — OXYCODONE-ACETAMINOPHEN 5-325 MG PO TABS: ORAL_TABLET | ORAL | 0 refills | Status: DC

## 2016-06-28 ENCOUNTER — Ambulatory Visit (INDEPENDENT_AMBULATORY_CARE_PROVIDER_SITE_OTHER): Payer: Medicare Other | Admitting: Orthopaedic Surgery

## 2016-06-28 ENCOUNTER — Encounter: Payer: Self-pay | Admitting: Orthopaedic Surgery

## 2016-06-28 ENCOUNTER — Telehealth: Payer: Self-pay | Admitting: Orthopaedic Surgery

## 2016-06-28 VITALS — BP 143/126 | HR 71 | Temp 97.7°F | Ht 60.5 in | Wt 232.0 lb

## 2016-06-28 DIAGNOSIS — S42201A Unspecified fracture of upper end of right humerus, initial encounter for closed fracture: Secondary | ICD-10-CM | POA: Diagnosis not present

## 2016-06-28 NOTE — Telephone Encounter (Signed)
I told her the medicine from the ER was STRONGER than what she had been on and I have continued it.  It is VERY strong.

## 2016-06-28 NOTE — Progress Notes (Signed)
[name], female DOB:1945-12-29, 70 y.o. [id number]  Chief Complaint  Patient presents with  . Arm Injury    right humeral head fracture, DOI 06/23/16    HPI  Annette Saunders is a 70 y.o. female who fell on 06-23-16 and fractured her right proximal humerus.  She was seen in the ER. X-rays were done. I have reviewed the x-rays and the ER report.  She has no other injury.  She is not wearing her shoulder immobilizer as she cannot figure out how to use all the straps.  I will change to a sling.  She has no numbness. HPI  Body mass index is 44.56 kg/m.  ROS  Review of Systems  Constitutional:       Patient does not have diabetes Patient has hypertension Patient does not have COPD Patient does not smoke.  HENT: Negative for congestion.   Respiratory: Negative for cough and shortness of breath.   Cardiovascular: Negative for chest pain.  Musculoskeletal: Positive for back pain and myalgias.  Allergic/Immunologic: Negative for environmental allergies.  All other systems reviewed and are negative.   Past Medical History:  Diagnosis Date  . Anxiety   . High cholesterol   . Hypertension     Past Surgical History:  Procedure Laterality Date  . ELBOW FRACTURE SURGERY    . JOINT REPLACEMENT     bilateral knee  . TONSILLECTOMY      No family history on file.  Social History Social History  Substance Use Topics  . Smoking status: Never Smoker  . Smokeless tobacco: Never Used  . Alcohol use No    No Known Allergies  Current Outpatient Prescriptions  Medication Sig Dispense Refill  . amLODipine (NORVASC) 2.5 MG tablet Take 2.5 mg by mouth daily.    [name] aspirin 81 MG tablet Take 81 mg by mouth daily.    [name] atorvastatin (LIPITOR) 40 MG tablet Take 40 mg by mouth daily.    . Cholecalciferol (VITAMIN D) 2000 units CAPS Take 6,000 Units by mouth daily.    [name] escitalopram (LEXAPRO) 10 MG tablet Take 10 mg by mouth daily.    [name] esomeprazole (NEXIUM) 20 MG  capsule Take 20 mg by mouth daily at 12 noon.    . gabapentin (NEURONTIN) 100 MG capsule Take 100 mg by mouth 3 (three) times daily. On titration schedule. Increase to a max of 3 capsules 3 times a day    . HYDROcodone-acetaminophen (NORCO) 7.5-325 MG tablet Take 1 tablet by mouth every 4 (four) hours as needed for moderate pain (Must last 30 days.  Do not drive or operate machinery while taking this medicine.). 120 tablet 0  . lisinopril (PRINIVIL,ZESTRIL) 20 MG tablet Take 20 mg by mouth daily.    [name] oxyCODONE-acetaminophen (PERCOCET/ROXICET) 5-325 MG tablet Take 1 tablet by mouth every 6 (six) hours as needed. 20 tablet 0  . oxyCODONE-acetaminophen (PERCOCET/ROXICET) 5-325 MG tablet One tablet every six hours for pain. 56 tablet 0   No current facility-administered medications for this visit.      Physical Exam  Blood pressure (!) 143/126, pulse 71, temperature 97.7 F (36.5 C), height 5' 0.5" (1.537 m), weight 232 lb (105.2 kg).  Constitutional: overall normal hygiene, normal nutrition, well developed, normal grooming, normal body habitus. Assistive device:none  Musculoskeletal: gait and station Limp none, muscle tone and strength are normal, no tremors or atrophy is present.  .  Neurological: coordination overall normal.  Deep tendon reflex/nerve stretch intact.  Sensation normal.  Cranial  nerves II-XII intact.   Skin:   Normal overall no scars, lesions, ulcers or rashes. No psoriasis.  Psychiatric: Alert and oriented x 3.  Recent memory intact, remote memory unclear.  Normal mood and affect. Well groomed.  Good eye contact.  Cardiovascular: overall no swelling, no varicosities, no edema bilaterally, normal temperatures of the legs and arms, no clubbing, cyanosis and good capillary refill.  Lymphatic: palpation is normal.  She has very limited motion of the right shoulder secondary to the fracture.  NV intact. She has no redness or ecchymosis.  The patient has been educated about  the nature of the problem(s) and counseled on treatment options.  The patient appeared to understand what I have discussed and is in agreement with it.  Encounter Diagnosis  Name Primary?  . Closed traumatic nondisplaced fracture of proximal end of right humerus, initial encounter Yes    PLAN Call if any problems.  Precautions discussed.  Continue current medications.   Return to clinic 1 week   X-rays on return.  Electronically Signed Sanjuana Kava, MD 10/31/201710:05 AM

## 2016-06-28 NOTE — Telephone Encounter (Signed)
Patient called this afternoon and stated that when she saw you this morning, she thought you were going to give her some stronger pain medication than she got at the ER.  When she got home, she realized it was the same   She wanted to know if this was correct?   Please advise

## 2016-06-28 NOTE — Addendum Note (Signed)
Addended by: Baldomero Lamy B on: 06/28/2016 10:14 AM   Modules accepted: Orders

## 2016-06-30 DIAGNOSIS — E785 Hyperlipidemia, unspecified: Secondary | ICD-10-CM | POA: Diagnosis not present

## 2016-06-30 DIAGNOSIS — S42201D Unspecified fracture of upper end of right humerus, subsequent encounter for fracture with routine healing: Secondary | ICD-10-CM | POA: Diagnosis not present

## 2016-06-30 DIAGNOSIS — I10 Essential (primary) hypertension: Secondary | ICD-10-CM | POA: Diagnosis not present

## 2016-06-30 DIAGNOSIS — Z7982 Long term (current) use of aspirin: Secondary | ICD-10-CM | POA: Diagnosis not present

## 2016-06-30 DIAGNOSIS — F419 Anxiety disorder, unspecified: Secondary | ICD-10-CM | POA: Diagnosis not present

## 2016-07-01 DIAGNOSIS — F419 Anxiety disorder, unspecified: Secondary | ICD-10-CM | POA: Diagnosis not present

## 2016-07-01 DIAGNOSIS — I10 Essential (primary) hypertension: Secondary | ICD-10-CM | POA: Diagnosis not present

## 2016-07-01 DIAGNOSIS — E785 Hyperlipidemia, unspecified: Secondary | ICD-10-CM | POA: Diagnosis not present

## 2016-07-01 DIAGNOSIS — Z7982 Long term (current) use of aspirin: Secondary | ICD-10-CM | POA: Diagnosis not present

## 2016-07-01 DIAGNOSIS — S42201D Unspecified fracture of upper end of right humerus, subsequent encounter for fracture with routine healing: Secondary | ICD-10-CM | POA: Diagnosis not present

## 2016-07-05 ENCOUNTER — Encounter: Payer: Self-pay | Admitting: Orthopaedic Surgery

## 2016-07-05 ENCOUNTER — Ambulatory Visit (INDEPENDENT_AMBULATORY_CARE_PROVIDER_SITE_OTHER): Payer: Self-pay | Admitting: Orthopaedic Surgery

## 2016-07-05 ENCOUNTER — Ambulatory Visit (INDEPENDENT_AMBULATORY_CARE_PROVIDER_SITE_OTHER): Payer: Medicare Other

## 2016-07-05 VITALS — BP 118/71 | HR 102 | Temp 97.7°F | Ht 65.0 in | Wt 229.0 lb

## 2016-07-05 DIAGNOSIS — R42 Dizziness and giddiness: Secondary | ICD-10-CM | POA: Diagnosis not present

## 2016-07-05 DIAGNOSIS — I119 Hypertensive heart disease without heart failure: Secondary | ICD-10-CM | POA: Diagnosis not present

## 2016-07-05 DIAGNOSIS — E559 Vitamin D deficiency, unspecified: Secondary | ICD-10-CM | POA: Diagnosis not present

## 2016-07-05 DIAGNOSIS — E785 Hyperlipidemia, unspecified: Secondary | ICD-10-CM | POA: Diagnosis not present

## 2016-07-05 DIAGNOSIS — R739 Hyperglycemia, unspecified: Secondary | ICD-10-CM | POA: Diagnosis not present

## 2016-07-05 DIAGNOSIS — S42201D Unspecified fracture of upper end of right humerus, subsequent encounter for fracture with routine healing: Secondary | ICD-10-CM

## 2016-07-05 DIAGNOSIS — Z23 Encounter for immunization: Secondary | ICD-10-CM | POA: Diagnosis not present

## 2016-07-05 NOTE — Progress Notes (Signed)
CC:  My shoulder is not as painful  She has pain in the right shoulder but it is less than before.  She is using her sling.  She has a tendency to put weight on the arm rest of the chair and lean to the right side.  I have asked her to let the arm be free in the sling and not lean against it.  She has no new trauma, no numbness.  NV intact.  I have adjusted the sling.  Encounter Diagnosis  Name Primary?  . Closed traumatic nondisplaced fracture of proximal end of right humerus with routine healing, subsequent encounter Yes   X-rays were done, reported separately.  Return in two weeks.  X-rays then.  Call if any problem.  Electronically Signed Sanjuana Kava, MD 11/7/20172:34 PM

## 2016-07-06 DIAGNOSIS — F419 Anxiety disorder, unspecified: Secondary | ICD-10-CM | POA: Diagnosis not present

## 2016-07-06 DIAGNOSIS — S42201D Unspecified fracture of upper end of right humerus, subsequent encounter for fracture with routine healing: Secondary | ICD-10-CM | POA: Diagnosis not present

## 2016-07-06 DIAGNOSIS — Z7982 Long term (current) use of aspirin: Secondary | ICD-10-CM | POA: Diagnosis not present

## 2016-07-06 DIAGNOSIS — I10 Essential (primary) hypertension: Secondary | ICD-10-CM | POA: Diagnosis not present

## 2016-07-06 DIAGNOSIS — E785 Hyperlipidemia, unspecified: Secondary | ICD-10-CM | POA: Diagnosis not present

## 2016-07-07 DIAGNOSIS — I10 Essential (primary) hypertension: Secondary | ICD-10-CM | POA: Diagnosis not present

## 2016-07-07 DIAGNOSIS — Z7982 Long term (current) use of aspirin: Secondary | ICD-10-CM | POA: Diagnosis not present

## 2016-07-07 DIAGNOSIS — S42201D Unspecified fracture of upper end of right humerus, subsequent encounter for fracture with routine healing: Secondary | ICD-10-CM | POA: Diagnosis not present

## 2016-07-07 DIAGNOSIS — F419 Anxiety disorder, unspecified: Secondary | ICD-10-CM | POA: Diagnosis not present

## 2016-07-07 DIAGNOSIS — E785 Hyperlipidemia, unspecified: Secondary | ICD-10-CM | POA: Diagnosis not present

## 2016-07-11 DIAGNOSIS — Z7982 Long term (current) use of aspirin: Secondary | ICD-10-CM | POA: Diagnosis not present

## 2016-07-11 DIAGNOSIS — E785 Hyperlipidemia, unspecified: Secondary | ICD-10-CM | POA: Diagnosis not present

## 2016-07-11 DIAGNOSIS — S42201D Unspecified fracture of upper end of right humerus, subsequent encounter for fracture with routine healing: Secondary | ICD-10-CM | POA: Diagnosis not present

## 2016-07-11 DIAGNOSIS — F419 Anxiety disorder, unspecified: Secondary | ICD-10-CM | POA: Diagnosis not present

## 2016-07-11 DIAGNOSIS — I10 Essential (primary) hypertension: Secondary | ICD-10-CM | POA: Diagnosis not present

## 2016-07-12 ENCOUNTER — Telehealth: Payer: Self-pay | Admitting: Orthopaedic Surgery

## 2016-07-12 ENCOUNTER — Other Ambulatory Visit: Payer: Self-pay | Admitting: *Deleted

## 2016-07-12 MED ORDER — OXYCODONE-ACETAMINOPHEN 5-325 MG PO TABS: ORAL_TABLET | ORAL | 0 refills | Status: DC

## 2016-07-12 NOTE — Telephone Encounter (Signed)
Patient of Dr. Brooke Bonito requests refill on Oxycodone/Acetaminophen (Percocet)  5-325  Mgs.   Qty 55  Sig: One tablet every six hours for pain.

## 2016-07-13 DIAGNOSIS — E785 Hyperlipidemia, unspecified: Secondary | ICD-10-CM | POA: Diagnosis not present

## 2016-07-13 DIAGNOSIS — I10 Essential (primary) hypertension: Secondary | ICD-10-CM | POA: Diagnosis not present

## 2016-07-13 DIAGNOSIS — S42201D Unspecified fracture of upper end of right humerus, subsequent encounter for fracture with routine healing: Secondary | ICD-10-CM | POA: Diagnosis not present

## 2016-07-13 DIAGNOSIS — F419 Anxiety disorder, unspecified: Secondary | ICD-10-CM | POA: Diagnosis not present

## 2016-07-13 DIAGNOSIS — Z7982 Long term (current) use of aspirin: Secondary | ICD-10-CM | POA: Diagnosis not present

## 2016-07-14 DIAGNOSIS — Z7982 Long term (current) use of aspirin: Secondary | ICD-10-CM | POA: Diagnosis not present

## 2016-07-14 DIAGNOSIS — S42201D Unspecified fracture of upper end of right humerus, subsequent encounter for fracture with routine healing: Secondary | ICD-10-CM | POA: Diagnosis not present

## 2016-07-14 DIAGNOSIS — F419 Anxiety disorder, unspecified: Secondary | ICD-10-CM | POA: Diagnosis not present

## 2016-07-14 DIAGNOSIS — I10 Essential (primary) hypertension: Secondary | ICD-10-CM | POA: Diagnosis not present

## 2016-07-14 DIAGNOSIS — E785 Hyperlipidemia, unspecified: Secondary | ICD-10-CM | POA: Diagnosis not present

## 2016-07-18 DIAGNOSIS — S42201D Unspecified fracture of upper end of right humerus, subsequent encounter for fracture with routine healing: Secondary | ICD-10-CM | POA: Diagnosis not present

## 2016-07-18 DIAGNOSIS — I10 Essential (primary) hypertension: Secondary | ICD-10-CM | POA: Diagnosis not present

## 2016-07-18 DIAGNOSIS — Z7982 Long term (current) use of aspirin: Secondary | ICD-10-CM | POA: Diagnosis not present

## 2016-07-18 DIAGNOSIS — E785 Hyperlipidemia, unspecified: Secondary | ICD-10-CM | POA: Diagnosis not present

## 2016-07-18 DIAGNOSIS — F419 Anxiety disorder, unspecified: Secondary | ICD-10-CM | POA: Diagnosis not present

## 2016-07-19 ENCOUNTER — Ambulatory Visit (INDEPENDENT_AMBULATORY_CARE_PROVIDER_SITE_OTHER): Payer: Self-pay | Admitting: Orthopaedic Surgery

## 2016-07-19 ENCOUNTER — Ambulatory Visit (INDEPENDENT_AMBULATORY_CARE_PROVIDER_SITE_OTHER): Payer: Medicare Other

## 2016-07-19 VITALS — BP 128/88 | Temp 94.0°F | Ht 65.0 in

## 2016-07-19 DIAGNOSIS — Z7982 Long term (current) use of aspirin: Secondary | ICD-10-CM | POA: Diagnosis not present

## 2016-07-19 DIAGNOSIS — F419 Anxiety disorder, unspecified: Secondary | ICD-10-CM | POA: Diagnosis not present

## 2016-07-19 DIAGNOSIS — S42201D Unspecified fracture of upper end of right humerus, subsequent encounter for fracture with routine healing: Secondary | ICD-10-CM

## 2016-07-19 DIAGNOSIS — I10 Essential (primary) hypertension: Secondary | ICD-10-CM | POA: Diagnosis not present

## 2016-07-19 DIAGNOSIS — E785 Hyperlipidemia, unspecified: Secondary | ICD-10-CM | POA: Diagnosis not present

## 2016-07-19 NOTE — Progress Notes (Signed)
CC:  My shoulder has some pain still, not much but it is there  She has done well with the fracture of the right proximal humerus.  She has been using a sling.  NV intact.  Initial motion is fair.  Encounter Diagnosis  Name Primary?  . Closed traumatic nondisplaced fracture of proximal end of right humerus with routine healing, subsequent encounter Yes   X-rays were done, reported separately.  I will begin OT at home for her.  Return in two weeks.  X-rays on return.  Call if any problem.  Precautions discussed.  Electronically Signed Sanjuana Kava, MD 11/21/20174:09 PM

## 2016-07-20 DIAGNOSIS — I10 Essential (primary) hypertension: Secondary | ICD-10-CM | POA: Diagnosis not present

## 2016-07-20 DIAGNOSIS — I119 Hypertensive heart disease without heart failure: Secondary | ICD-10-CM | POA: Diagnosis not present

## 2016-07-20 DIAGNOSIS — F419 Anxiety disorder, unspecified: Secondary | ICD-10-CM | POA: Diagnosis not present

## 2016-07-20 DIAGNOSIS — Z7982 Long term (current) use of aspirin: Secondary | ICD-10-CM | POA: Diagnosis not present

## 2016-07-20 DIAGNOSIS — R42 Dizziness and giddiness: Secondary | ICD-10-CM | POA: Diagnosis not present

## 2016-07-20 DIAGNOSIS — E785 Hyperlipidemia, unspecified: Secondary | ICD-10-CM | POA: Diagnosis not present

## 2016-07-20 DIAGNOSIS — S42201D Unspecified fracture of upper end of right humerus, subsequent encounter for fracture with routine healing: Secondary | ICD-10-CM | POA: Diagnosis not present

## 2016-07-22 DIAGNOSIS — Z7982 Long term (current) use of aspirin: Secondary | ICD-10-CM | POA: Diagnosis not present

## 2016-07-22 DIAGNOSIS — I10 Essential (primary) hypertension: Secondary | ICD-10-CM | POA: Diagnosis not present

## 2016-07-22 DIAGNOSIS — E785 Hyperlipidemia, unspecified: Secondary | ICD-10-CM | POA: Diagnosis not present

## 2016-07-22 DIAGNOSIS — F419 Anxiety disorder, unspecified: Secondary | ICD-10-CM | POA: Diagnosis not present

## 2016-07-22 DIAGNOSIS — S42201D Unspecified fracture of upper end of right humerus, subsequent encounter for fracture with routine healing: Secondary | ICD-10-CM | POA: Diagnosis not present

## 2016-07-25 DIAGNOSIS — I10 Essential (primary) hypertension: Secondary | ICD-10-CM | POA: Diagnosis not present

## 2016-07-25 DIAGNOSIS — S42201D Unspecified fracture of upper end of right humerus, subsequent encounter for fracture with routine healing: Secondary | ICD-10-CM | POA: Diagnosis not present

## 2016-07-25 DIAGNOSIS — F419 Anxiety disorder, unspecified: Secondary | ICD-10-CM | POA: Diagnosis not present

## 2016-07-25 DIAGNOSIS — Z7982 Long term (current) use of aspirin: Secondary | ICD-10-CM | POA: Diagnosis not present

## 2016-07-25 DIAGNOSIS — E785 Hyperlipidemia, unspecified: Secondary | ICD-10-CM | POA: Diagnosis not present

## 2016-07-26 DIAGNOSIS — S42201D Unspecified fracture of upper end of right humerus, subsequent encounter for fracture with routine healing: Secondary | ICD-10-CM | POA: Diagnosis not present

## 2016-07-26 DIAGNOSIS — Z7982 Long term (current) use of aspirin: Secondary | ICD-10-CM | POA: Diagnosis not present

## 2016-07-26 DIAGNOSIS — I10 Essential (primary) hypertension: Secondary | ICD-10-CM | POA: Diagnosis not present

## 2016-07-26 DIAGNOSIS — E785 Hyperlipidemia, unspecified: Secondary | ICD-10-CM | POA: Diagnosis not present

## 2016-07-26 DIAGNOSIS — F419 Anxiety disorder, unspecified: Secondary | ICD-10-CM | POA: Diagnosis not present

## 2016-07-27 DIAGNOSIS — S42201D Unspecified fracture of upper end of right humerus, subsequent encounter for fracture with routine healing: Secondary | ICD-10-CM | POA: Diagnosis not present

## 2016-07-27 DIAGNOSIS — I10 Essential (primary) hypertension: Secondary | ICD-10-CM | POA: Diagnosis not present

## 2016-07-27 DIAGNOSIS — E785 Hyperlipidemia, unspecified: Secondary | ICD-10-CM | POA: Diagnosis not present

## 2016-07-27 DIAGNOSIS — F419 Anxiety disorder, unspecified: Secondary | ICD-10-CM | POA: Diagnosis not present

## 2016-07-27 DIAGNOSIS — Z7982 Long term (current) use of aspirin: Secondary | ICD-10-CM | POA: Diagnosis not present

## 2016-07-28 DIAGNOSIS — E785 Hyperlipidemia, unspecified: Secondary | ICD-10-CM | POA: Diagnosis not present

## 2016-07-28 DIAGNOSIS — I10 Essential (primary) hypertension: Secondary | ICD-10-CM | POA: Diagnosis not present

## 2016-07-28 DIAGNOSIS — F419 Anxiety disorder, unspecified: Secondary | ICD-10-CM | POA: Diagnosis not present

## 2016-07-28 DIAGNOSIS — Z7982 Long term (current) use of aspirin: Secondary | ICD-10-CM | POA: Diagnosis not present

## 2016-07-28 DIAGNOSIS — S42201D Unspecified fracture of upper end of right humerus, subsequent encounter for fracture with routine healing: Secondary | ICD-10-CM | POA: Diagnosis not present

## 2016-08-02 ENCOUNTER — Ambulatory Visit (INDEPENDENT_AMBULATORY_CARE_PROVIDER_SITE_OTHER): Payer: Self-pay | Admitting: Orthopaedic Surgery

## 2016-08-02 ENCOUNTER — Ambulatory Visit (INDEPENDENT_AMBULATORY_CARE_PROVIDER_SITE_OTHER): Payer: Medicare Other

## 2016-08-02 VITALS — BP 144/86 | HR 81 | Temp 97.5°F | Ht 60.0 in | Wt 233.0 lb

## 2016-08-02 DIAGNOSIS — S42201D Unspecified fracture of upper end of right humerus, subsequent encounter for fracture with routine healing: Secondary | ICD-10-CM | POA: Diagnosis not present

## 2016-08-02 MED ORDER — OXYCODONE-ACETAMINOPHEN 5-325 MG PO TABS: ORAL_TABLET | ORAL | 0 refills | Status: DC

## 2016-08-02 NOTE — Progress Notes (Signed)
CC:  My shoulder is sore  She has been having OT at home by home health. She is making slow progress.    NV is intact.  Motion is limited but not painful right shoulder.   Encounter Diagnosis  Name Primary?  . Closed traumatic nondisplaced fracture of proximal end of right humerus with routine healing Yes    Continue Home health OT.  Return in two weeks.  X-rays on return.  Call if any problem.  Precautions discussed.  Electronically Signed Sanjuana Kava, MD 12/5/201710:34 AM

## 2016-08-03 DIAGNOSIS — E785 Hyperlipidemia, unspecified: Secondary | ICD-10-CM | POA: Diagnosis not present

## 2016-08-03 DIAGNOSIS — S42201D Unspecified fracture of upper end of right humerus, subsequent encounter for fracture with routine healing: Secondary | ICD-10-CM | POA: Diagnosis not present

## 2016-08-03 DIAGNOSIS — F419 Anxiety disorder, unspecified: Secondary | ICD-10-CM | POA: Diagnosis not present

## 2016-08-03 DIAGNOSIS — I10 Essential (primary) hypertension: Secondary | ICD-10-CM | POA: Diagnosis not present

## 2016-08-03 DIAGNOSIS — Z7982 Long term (current) use of aspirin: Secondary | ICD-10-CM | POA: Diagnosis not present

## 2016-08-04 DIAGNOSIS — E785 Hyperlipidemia, unspecified: Secondary | ICD-10-CM | POA: Diagnosis not present

## 2016-08-04 DIAGNOSIS — I119 Hypertensive heart disease without heart failure: Secondary | ICD-10-CM | POA: Diagnosis not present

## 2016-08-04 DIAGNOSIS — R739 Hyperglycemia, unspecified: Secondary | ICD-10-CM | POA: Diagnosis not present

## 2016-08-09 DIAGNOSIS — E785 Hyperlipidemia, unspecified: Secondary | ICD-10-CM | POA: Diagnosis not present

## 2016-08-09 DIAGNOSIS — S42201D Unspecified fracture of upper end of right humerus, subsequent encounter for fracture with routine healing: Secondary | ICD-10-CM | POA: Diagnosis not present

## 2016-08-09 DIAGNOSIS — I10 Essential (primary) hypertension: Secondary | ICD-10-CM | POA: Diagnosis not present

## 2016-08-09 DIAGNOSIS — Z7982 Long term (current) use of aspirin: Secondary | ICD-10-CM | POA: Diagnosis not present

## 2016-08-09 DIAGNOSIS — F419 Anxiety disorder, unspecified: Secondary | ICD-10-CM | POA: Diagnosis not present

## 2016-08-11 DIAGNOSIS — F419 Anxiety disorder, unspecified: Secondary | ICD-10-CM | POA: Diagnosis not present

## 2016-08-11 DIAGNOSIS — S42201D Unspecified fracture of upper end of right humerus, subsequent encounter for fracture with routine healing: Secondary | ICD-10-CM | POA: Diagnosis not present

## 2016-08-11 DIAGNOSIS — E785 Hyperlipidemia, unspecified: Secondary | ICD-10-CM | POA: Diagnosis not present

## 2016-08-11 DIAGNOSIS — I10 Essential (primary) hypertension: Secondary | ICD-10-CM | POA: Diagnosis not present

## 2016-08-11 DIAGNOSIS — Z7982 Long term (current) use of aspirin: Secondary | ICD-10-CM | POA: Diagnosis not present

## 2016-08-12 DIAGNOSIS — F419 Anxiety disorder, unspecified: Secondary | ICD-10-CM | POA: Diagnosis not present

## 2016-08-12 DIAGNOSIS — I10 Essential (primary) hypertension: Secondary | ICD-10-CM | POA: Diagnosis not present

## 2016-08-12 DIAGNOSIS — Z7982 Long term (current) use of aspirin: Secondary | ICD-10-CM | POA: Diagnosis not present

## 2016-08-12 DIAGNOSIS — S42201D Unspecified fracture of upper end of right humerus, subsequent encounter for fracture with routine healing: Secondary | ICD-10-CM | POA: Diagnosis not present

## 2016-08-12 DIAGNOSIS — E785 Hyperlipidemia, unspecified: Secondary | ICD-10-CM | POA: Diagnosis not present

## 2016-08-16 ENCOUNTER — Ambulatory Visit (INDEPENDENT_AMBULATORY_CARE_PROVIDER_SITE_OTHER): Payer: Medicare Other

## 2016-08-16 ENCOUNTER — Ambulatory Visit (INDEPENDENT_AMBULATORY_CARE_PROVIDER_SITE_OTHER): Payer: Medicare Other | Admitting: Orthopaedic Surgery

## 2016-08-16 DIAGNOSIS — S42201D Unspecified fracture of upper end of right humerus, subsequent encounter for fracture with routine healing: Secondary | ICD-10-CM

## 2016-08-16 NOTE — Progress Notes (Signed)
CC:  My shoulder is sore  She has some pain in the right shoulder on cold days.  She is having OT at her home.  She is doing her exercises.  NV is intact.  ROM is decreased but improved.  X-rays were done and reported separately.  Encounter Diagnosis  Name Primary?  . Closed traumatic nondisplaced fracture of proximal end of right humerus with routine healing, subsequent encounter Yes   Return in three weeks. X-rays on return.  Continue OT.  Call if any problem.  Precautions discussed.  Electronically Signed Sanjuana Kava, MD 12/19/20178:53 AM

## 2016-08-17 DIAGNOSIS — S42201D Unspecified fracture of upper end of right humerus, subsequent encounter for fracture with routine healing: Secondary | ICD-10-CM | POA: Diagnosis not present

## 2016-08-17 DIAGNOSIS — Z7982 Long term (current) use of aspirin: Secondary | ICD-10-CM | POA: Diagnosis not present

## 2016-08-17 DIAGNOSIS — F419 Anxiety disorder, unspecified: Secondary | ICD-10-CM | POA: Diagnosis not present

## 2016-08-17 DIAGNOSIS — I10 Essential (primary) hypertension: Secondary | ICD-10-CM | POA: Diagnosis not present

## 2016-08-17 DIAGNOSIS — E785 Hyperlipidemia, unspecified: Secondary | ICD-10-CM | POA: Diagnosis not present

## 2016-08-18 DIAGNOSIS — E785 Hyperlipidemia, unspecified: Secondary | ICD-10-CM | POA: Diagnosis not present

## 2016-08-18 DIAGNOSIS — F419 Anxiety disorder, unspecified: Secondary | ICD-10-CM | POA: Diagnosis not present

## 2016-08-18 DIAGNOSIS — S42201D Unspecified fracture of upper end of right humerus, subsequent encounter for fracture with routine healing: Secondary | ICD-10-CM | POA: Diagnosis not present

## 2016-08-18 DIAGNOSIS — Z7982 Long term (current) use of aspirin: Secondary | ICD-10-CM | POA: Diagnosis not present

## 2016-08-18 DIAGNOSIS — I10 Essential (primary) hypertension: Secondary | ICD-10-CM | POA: Diagnosis not present

## 2016-08-23 DIAGNOSIS — Z7982 Long term (current) use of aspirin: Secondary | ICD-10-CM | POA: Diagnosis not present

## 2016-08-23 DIAGNOSIS — I10 Essential (primary) hypertension: Secondary | ICD-10-CM | POA: Diagnosis not present

## 2016-08-23 DIAGNOSIS — S42201D Unspecified fracture of upper end of right humerus, subsequent encounter for fracture with routine healing: Secondary | ICD-10-CM | POA: Diagnosis not present

## 2016-08-23 DIAGNOSIS — F419 Anxiety disorder, unspecified: Secondary | ICD-10-CM | POA: Diagnosis not present

## 2016-08-23 DIAGNOSIS — E785 Hyperlipidemia, unspecified: Secondary | ICD-10-CM | POA: Diagnosis not present

## 2016-08-24 DIAGNOSIS — E785 Hyperlipidemia, unspecified: Secondary | ICD-10-CM | POA: Diagnosis not present

## 2016-08-24 DIAGNOSIS — I10 Essential (primary) hypertension: Secondary | ICD-10-CM | POA: Diagnosis not present

## 2016-08-24 DIAGNOSIS — Z7982 Long term (current) use of aspirin: Secondary | ICD-10-CM | POA: Diagnosis not present

## 2016-08-24 DIAGNOSIS — S42201D Unspecified fracture of upper end of right humerus, subsequent encounter for fracture with routine healing: Secondary | ICD-10-CM | POA: Diagnosis not present

## 2016-08-24 DIAGNOSIS — F419 Anxiety disorder, unspecified: Secondary | ICD-10-CM | POA: Diagnosis not present

## 2016-08-26 DIAGNOSIS — S42201D Unspecified fracture of upper end of right humerus, subsequent encounter for fracture with routine healing: Secondary | ICD-10-CM | POA: Diagnosis not present

## 2016-08-26 DIAGNOSIS — F419 Anxiety disorder, unspecified: Secondary | ICD-10-CM | POA: Diagnosis not present

## 2016-08-26 DIAGNOSIS — I10 Essential (primary) hypertension: Secondary | ICD-10-CM | POA: Diagnosis not present

## 2016-08-26 DIAGNOSIS — E785 Hyperlipidemia, unspecified: Secondary | ICD-10-CM | POA: Diagnosis not present

## 2016-08-26 DIAGNOSIS — Z7982 Long term (current) use of aspirin: Secondary | ICD-10-CM | POA: Diagnosis not present

## 2016-08-29 DIAGNOSIS — E785 Hyperlipidemia, unspecified: Secondary | ICD-10-CM | POA: Diagnosis not present

## 2016-08-29 DIAGNOSIS — Z7982 Long term (current) use of aspirin: Secondary | ICD-10-CM | POA: Diagnosis not present

## 2016-08-29 DIAGNOSIS — I10 Essential (primary) hypertension: Secondary | ICD-10-CM | POA: Diagnosis not present

## 2016-08-29 DIAGNOSIS — S42201D Unspecified fracture of upper end of right humerus, subsequent encounter for fracture with routine healing: Secondary | ICD-10-CM | POA: Diagnosis not present

## 2016-08-29 DIAGNOSIS — F419 Anxiety disorder, unspecified: Secondary | ICD-10-CM | POA: Diagnosis not present

## 2016-08-30 DIAGNOSIS — F419 Anxiety disorder, unspecified: Secondary | ICD-10-CM | POA: Diagnosis not present

## 2016-08-30 DIAGNOSIS — I10 Essential (primary) hypertension: Secondary | ICD-10-CM | POA: Diagnosis not present

## 2016-08-30 DIAGNOSIS — Z7982 Long term (current) use of aspirin: Secondary | ICD-10-CM | POA: Diagnosis not present

## 2016-08-30 DIAGNOSIS — E785 Hyperlipidemia, unspecified: Secondary | ICD-10-CM | POA: Diagnosis not present

## 2016-08-30 DIAGNOSIS — S42201D Unspecified fracture of upper end of right humerus, subsequent encounter for fracture with routine healing: Secondary | ICD-10-CM | POA: Diagnosis not present

## 2016-09-02 DIAGNOSIS — F419 Anxiety disorder, unspecified: Secondary | ICD-10-CM | POA: Diagnosis not present

## 2016-09-02 DIAGNOSIS — Z7982 Long term (current) use of aspirin: Secondary | ICD-10-CM | POA: Diagnosis not present

## 2016-09-02 DIAGNOSIS — S42201D Unspecified fracture of upper end of right humerus, subsequent encounter for fracture with routine healing: Secondary | ICD-10-CM | POA: Diagnosis not present

## 2016-09-02 DIAGNOSIS — I10 Essential (primary) hypertension: Secondary | ICD-10-CM | POA: Diagnosis not present

## 2016-09-02 DIAGNOSIS — E785 Hyperlipidemia, unspecified: Secondary | ICD-10-CM | POA: Diagnosis not present

## 2016-09-05 ENCOUNTER — Ambulatory Visit (HOSPITAL_COMMUNITY): Payer: Self-pay | Admitting: Psychiatry

## 2016-09-05 DIAGNOSIS — I10 Essential (primary) hypertension: Secondary | ICD-10-CM | POA: Diagnosis not present

## 2016-09-05 DIAGNOSIS — Z6841 Body Mass Index (BMI) 40.0 and over, adult: Secondary | ICD-10-CM | POA: Diagnosis not present

## 2016-09-06 ENCOUNTER — Encounter: Payer: Self-pay | Admitting: Orthopaedic Surgery

## 2016-09-06 ENCOUNTER — Ambulatory Visit: Payer: Medicare Other

## 2016-09-06 DIAGNOSIS — E785 Hyperlipidemia, unspecified: Secondary | ICD-10-CM | POA: Diagnosis not present

## 2016-09-06 DIAGNOSIS — F419 Anxiety disorder, unspecified: Secondary | ICD-10-CM | POA: Diagnosis not present

## 2016-09-06 DIAGNOSIS — I10 Essential (primary) hypertension: Secondary | ICD-10-CM | POA: Diagnosis not present

## 2016-09-06 DIAGNOSIS — Z7982 Long term (current) use of aspirin: Secondary | ICD-10-CM | POA: Diagnosis not present

## 2016-09-06 DIAGNOSIS — S42201D Unspecified fracture of upper end of right humerus, subsequent encounter for fracture with routine healing: Secondary | ICD-10-CM | POA: Diagnosis not present

## 2016-09-07 ENCOUNTER — Ambulatory Visit: Payer: Self-pay | Admitting: Orthopaedic Surgery

## 2016-09-09 DIAGNOSIS — S42201D Unspecified fracture of upper end of right humerus, subsequent encounter for fracture with routine healing: Secondary | ICD-10-CM | POA: Diagnosis not present

## 2016-09-09 DIAGNOSIS — I10 Essential (primary) hypertension: Secondary | ICD-10-CM | POA: Diagnosis not present

## 2016-09-09 DIAGNOSIS — F419 Anxiety disorder, unspecified: Secondary | ICD-10-CM | POA: Diagnosis not present

## 2016-09-09 DIAGNOSIS — Z7982 Long term (current) use of aspirin: Secondary | ICD-10-CM | POA: Diagnosis not present

## 2016-09-09 DIAGNOSIS — E785 Hyperlipidemia, unspecified: Secondary | ICD-10-CM | POA: Diagnosis not present

## 2016-09-12 DIAGNOSIS — L250 Unspecified contact dermatitis due to cosmetics: Secondary | ICD-10-CM | POA: Diagnosis not present

## 2016-09-12 DIAGNOSIS — L918 Other hypertrophic disorders of the skin: Secondary | ICD-10-CM | POA: Diagnosis not present

## 2016-09-13 DIAGNOSIS — Z6841 Body Mass Index (BMI) 40.0 and over, adult: Secondary | ICD-10-CM | POA: Diagnosis not present

## 2016-09-13 DIAGNOSIS — F419 Anxiety disorder, unspecified: Secondary | ICD-10-CM | POA: Diagnosis not present

## 2016-09-13 DIAGNOSIS — S42201D Unspecified fracture of upper end of right humerus, subsequent encounter for fracture with routine healing: Secondary | ICD-10-CM | POA: Diagnosis not present

## 2016-09-13 DIAGNOSIS — Z7982 Long term (current) use of aspirin: Secondary | ICD-10-CM | POA: Diagnosis not present

## 2016-09-13 DIAGNOSIS — E785 Hyperlipidemia, unspecified: Secondary | ICD-10-CM | POA: Diagnosis not present

## 2016-09-13 DIAGNOSIS — I10 Essential (primary) hypertension: Secondary | ICD-10-CM | POA: Diagnosis not present

## 2016-09-13 NOTE — Progress Notes (Signed)
This encounter was created in error - please disregard.

## 2016-09-14 ENCOUNTER — Ambulatory Visit: Payer: Self-pay | Admitting: Orthopaedic Surgery

## 2016-09-19 DIAGNOSIS — Z7982 Long term (current) use of aspirin: Secondary | ICD-10-CM | POA: Diagnosis not present

## 2016-09-19 DIAGNOSIS — I10 Essential (primary) hypertension: Secondary | ICD-10-CM | POA: Diagnosis not present

## 2016-09-19 DIAGNOSIS — S42201D Unspecified fracture of upper end of right humerus, subsequent encounter for fracture with routine healing: Secondary | ICD-10-CM | POA: Diagnosis not present

## 2016-09-19 DIAGNOSIS — F419 Anxiety disorder, unspecified: Secondary | ICD-10-CM | POA: Diagnosis not present

## 2016-09-19 DIAGNOSIS — E785 Hyperlipidemia, unspecified: Secondary | ICD-10-CM | POA: Diagnosis not present

## 2016-09-20 ENCOUNTER — Ambulatory Visit (INDEPENDENT_AMBULATORY_CARE_PROVIDER_SITE_OTHER): Payer: Medicare Other

## 2016-09-20 ENCOUNTER — Ambulatory Visit (INDEPENDENT_AMBULATORY_CARE_PROVIDER_SITE_OTHER): Payer: Self-pay | Admitting: Orthopaedic Surgery

## 2016-09-20 DIAGNOSIS — S42201D Unspecified fracture of upper end of right humerus, subsequent encounter for fracture with routine healing: Secondary | ICD-10-CM | POA: Diagnosis not present

## 2016-09-20 DIAGNOSIS — Z6841 Body Mass Index (BMI) 40.0 and over, adult: Secondary | ICD-10-CM | POA: Diagnosis not present

## 2016-09-20 NOTE — Progress Notes (Signed)
CC:  My shoulder is doing OK  She has very good ROM of the right shoulder.  She has completed OT.  She has no new trauma, no paresthesias.  X-rays were done and reported separately.  Encounter Diagnosis  Name Primary?  . Closed traumatic nondisplaced fracture of proximal end of right humerus with routine healing, subsequent encounter Yes   I will see her as needed.  Call if any problem.  Electronically Signed Sanjuana Kava, MD 1/23/20189:18 AM

## 2016-09-22 DIAGNOSIS — I10 Essential (primary) hypertension: Secondary | ICD-10-CM | POA: Diagnosis not present

## 2016-09-22 DIAGNOSIS — K219 Gastro-esophageal reflux disease without esophagitis: Secondary | ICD-10-CM | POA: Diagnosis not present

## 2016-10-05 ENCOUNTER — Telehealth: Payer: Self-pay | Admitting: Orthopaedic Surgery

## 2016-10-05 NOTE — Telephone Encounter (Signed)
[facility] calling for pt order status for home health. She stated they were last faxed on 09/30/16.  [phone] ext [phone] Mendel Ryder

## 2016-10-10 ENCOUNTER — Other Ambulatory Visit (HOSPITAL_COMMUNITY): Payer: Self-pay | Admitting: Internal Medicine

## 2016-10-10 DIAGNOSIS — K219 Gastro-esophageal reflux disease without esophagitis: Secondary | ICD-10-CM | POA: Diagnosis not present

## 2016-10-10 DIAGNOSIS — R5383 Other fatigue: Secondary | ICD-10-CM | POA: Diagnosis not present

## 2016-10-10 DIAGNOSIS — E559 Vitamin D deficiency, unspecified: Secondary | ICD-10-CM | POA: Diagnosis not present

## 2016-10-10 DIAGNOSIS — Z1231 Encounter for screening mammogram for malignant neoplasm of breast: Secondary | ICD-10-CM

## 2016-10-10 DIAGNOSIS — I119 Hypertensive heart disease without heart failure: Secondary | ICD-10-CM | POA: Diagnosis not present

## 2016-10-10 DIAGNOSIS — I10 Essential (primary) hypertension: Secondary | ICD-10-CM | POA: Diagnosis not present

## 2016-10-10 DIAGNOSIS — R739 Hyperglycemia, unspecified: Secondary | ICD-10-CM | POA: Diagnosis not present

## 2016-10-10 DIAGNOSIS — E785 Hyperlipidemia, unspecified: Secondary | ICD-10-CM | POA: Diagnosis not present

## 2016-10-11 ENCOUNTER — Encounter: Payer: Self-pay | Admitting: Gastroenterology

## 2016-10-19 ENCOUNTER — Ambulatory Visit (INDEPENDENT_AMBULATORY_CARE_PROVIDER_SITE_OTHER): Payer: Medicare Other | Admitting: Orthopaedic Surgery

## 2016-10-19 ENCOUNTER — Ambulatory Visit (HOSPITAL_COMMUNITY)
Admission: RE | Admit: 2016-10-19 | Discharge: 2016-10-19 | Disposition: A | Discharge status: Home / Self Care | Payer: Medicare Other | Source: Ambulatory Visit | Attending: Orthopaedic Surgery | Admitting: Orthopaedic Surgery

## 2016-10-19 ENCOUNTER — Encounter: Payer: Self-pay | Admitting: Orthopaedic Surgery

## 2016-10-19 VITALS — BP 124/80 | HR 88 | Temp 97.3°F | Resp 18 | Ht 61.0 in | Wt 232.0 lb

## 2016-10-19 DIAGNOSIS — M25571 Pain in right ankle and joints of right foot: Secondary | ICD-10-CM

## 2016-10-19 DIAGNOSIS — M7731 Calcaneal spur, right foot: Secondary | ICD-10-CM | POA: Insufficient documentation

## 2016-10-19 MED ORDER — PREDNISONE 5 MG (21) PO TBPK: ORAL_TABLET | ORAL | 0 refills | Status: DC

## 2016-10-19 NOTE — Progress Notes (Signed)
[name], female DOB:09/13/1945, 71 y.o. [id number]  Chief Complaint  Patient presents with  . Foot Swelling    Right foot pain for 1 year, no injury. Xrays at [facility].    HPI  Annette Saunders is a 71 y.o. female who has right lateral foot pain.  It hurts when walking.  She has no trauma, no redness, no swelling.  It has been present for over a year.  She has tried heat, ice, rubs, inserts with no help.   HPI  Body mass index is 43.84 kg/m.  ROS  Review of Systems  Constitutional:       Patient does not have diabetes Patient has hypertension Patient does not have COPD Patient does not smoke.  HENT: Negative for congestion.   Respiratory: Negative for cough and shortness of breath.   Cardiovascular: Negative for chest pain.  Musculoskeletal: Positive for back pain and myalgias.  Allergic/Immunologic: Negative for environmental allergies.  Psychiatric/Behavioral: The patient is nervous/anxious.   All other systems reviewed and are negative.   Past Medical History:  Diagnosis Date  . Anxiety   . High cholesterol   . Hypertension     Past Surgical History:  Procedure Laterality Date  . ELBOW FRACTURE SURGERY    . JOINT REPLACEMENT     bilateral knee  . TONSILLECTOMY      History reviewed. No pertinent family history.  Social History Social History  Substance Use Topics  . Smoking status: Never Smoker  . Smokeless tobacco: Never Used  . Alcohol use No    No Known Allergies  Current Outpatient Prescriptions  Medication Sig Dispense Refill  . amLODipine (NORVASC) 2.5 MG tablet Take 2.5 mg by mouth daily.    [name] aspirin 81 MG tablet Take 81 mg by mouth daily.    [name] atorvastatin (LIPITOR) 40 MG tablet Take 40 mg by mouth daily.    . Cholecalciferol (VITAMIN D) 2000 units CAPS Take 6,000 Units by mouth daily.    [name] escitalopram (LEXAPRO) 10 MG tablet Take 10 mg by mouth daily.    [name] esomeprazole (NEXIUM) 20 MG capsule Take 20 mg by mouth daily  at 12 noon.    . gabapentin (NEURONTIN) 100 MG capsule Take 100 mg by mouth 3 (three) times daily. On titration schedule. Increase to a max of 3 capsules 3 times a day    . HYDROcodone-acetaminophen (NORCO) 7.5-325 MG tablet Take 1 tablet by mouth every 4 (four) hours as needed for moderate pain (Must last 30 days.  Do not drive or operate machinery while taking this medicine.). 120 tablet 0  . lisinopril (PRINIVIL,ZESTRIL) 20 MG tablet Take 20 mg by mouth daily.    [name] oxyCODONE-acetaminophen (PERCOCET/ROXICET) 5-325 MG tablet Take 1 tablet by mouth every 6 (six) hours as needed. 20 tablet 0  . oxyCODONE-acetaminophen (PERCOCET/ROXICET) 5-325 MG tablet One tablet every six hours for pain. 56 tablet 0  . predniSONE (STERAPRED UNI-PAK 21 TAB) 5 MG (21) TBPK tablet Take 6 pills first day; 5 pills second day; 4 pills third day; 3 pills fourth day; 2 pills next day and 1 pill last day. 21 tablet 0   No current facility-administered medications for this visit.      Physical Exam  Blood pressure 124/80, pulse 88, temperature 97.3 F (36.3 C), resp. rate 18, height 5\' 1"  (1.549 m), weight 232 lb (105.2 kg).  Constitutional: overall normal hygiene, normal nutrition, well developed, normal grooming, normal body habitus. Assistive device:none  Musculoskeletal: gait and  station Limp right, muscle tone and strength are normal, no tremors or atrophy is present.  .  Neurological: coordination overall normal.  Deep tendon reflex/nerve stretch intact.  Sensation normal.  Cranial nerves II-XII intact.   Skin:   Normal overall no scars, lesions, ulcers or rashes. No psoriasis.  Psychiatric: Alert and oriented x 3.  Recent memory intact, remote memory unclear.  Normal mood and affect. Well groomed.  Good eye contact.  Cardiovascular: overall no swelling, no varicosities, no edema bilaterally, normal temperatures of the legs and arms, no clubbing, cyanosis and good capillary refill.  Lymphatic: palpation is  normal.  The right lateral foot is tender.  She has callus of the sole on the lateral side of the foot plantar side.  NV is intact.  There is no swelling, no ecchymosis. Gait is good.  The patient has been educated about the nature of the problem(s) and counseled on treatment options.  The patient appeared to understand what I have discussed and is in agreement with it.  Encounter Diagnosis  Name Primary?  . Pain in joint involving right ankle and foot Yes    PLAN Call if any problems.  Precautions discussed.  Continue current medications.   Return to clinic 1 month   Rx for prednisone dose pack given.  Use the BioFreeze to area daily.  Electronically Signed Annette Kava, MD 2/21/201811:20 AM

## 2016-10-20 ENCOUNTER — Ambulatory Visit (HOSPITAL_COMMUNITY)
Admission: RE | Admit: 2016-10-20 | Discharge: 2016-10-20 | Disposition: A | Discharge status: Home / Self Care | Payer: Medicare Other | Source: Ambulatory Visit | Attending: Internal Medicine | Admitting: Internal Medicine

## 2016-10-20 DIAGNOSIS — Z1231 Encounter for screening mammogram for malignant neoplasm of breast: Secondary | ICD-10-CM | POA: Diagnosis present

## 2016-10-26 DIAGNOSIS — K219 Gastro-esophageal reflux disease without esophagitis: Secondary | ICD-10-CM | POA: Diagnosis not present

## 2016-10-26 DIAGNOSIS — R739 Hyperglycemia, unspecified: Secondary | ICD-10-CM | POA: Diagnosis not present

## 2016-10-26 DIAGNOSIS — G4709 Other insomnia: Secondary | ICD-10-CM | POA: Diagnosis not present

## 2016-10-26 DIAGNOSIS — I119 Hypertensive heart disease without heart failure: Secondary | ICD-10-CM | POA: Diagnosis not present

## 2016-10-27 ENCOUNTER — Encounter: Payer: Self-pay | Admitting: Gastroenterology

## 2016-10-27 ENCOUNTER — Encounter (INDEPENDENT_AMBULATORY_CARE_PROVIDER_SITE_OTHER): Payer: Self-pay

## 2016-10-27 ENCOUNTER — Ambulatory Visit (INDEPENDENT_AMBULATORY_CARE_PROVIDER_SITE_OTHER): Payer: Medicare Other | Admitting: Gastroenterology

## 2016-10-27 DIAGNOSIS — K219 Gastro-esophageal reflux disease without esophagitis: Secondary | ICD-10-CM

## 2016-10-27 MED ORDER — HYDROCORTISONE 2.5 % RE CREA: 1.0000 "application " | TOPICAL_CREAM | Freq: Two times a day (BID) | RECTAL | 1 refills | Status: DC

## 2016-10-27 NOTE — Assessment & Plan Note (Signed)
71 year old female with history of chronic GERD, currently taking Nexium and feels this works well overall. Nocturnal symptoms likely related to dietary behaviors. Discussed avoidance of eating late at night, taking Nexium on an empty stomach every morning, possible H2 blocker as needed in evening. No dysphagia or alarm symptoms. Some nausea after eating, but we will pursue strict dietary/behavior modification first. I am requesting records from what sounds like an extensive paraesophageal hernia or something of that nature, along with EGD/TCS from [place]. Consider UGI vs EGD depending on further symptoms. Return in 8 weeks.

## 2016-10-27 NOTE — Progress Notes (Signed)
Primary Care Physician:  Doree Albee, MD Primary Gastroenterologist:  Dr. Oneida Alar   Chief Complaint  Patient presents with  . Gastroesophageal Reflux    worse at night, "feels like bubbles come up", hurts in chest    HPI:   Annette Saunders is a 71 y.o. female presenting today at the request of Dr. Anastasio Champion due to GERD.    When sleeping at night, has bubbles coming up. No pain. Just a little nervous about what it could be. Has a history of a large hiatal hernia repair a few years ago. I am requesting reports from Dr. Duke Salvia [phone] . Had an upper endoscopy by Dr. Cristine Polio in [place] 2 years ago [phone]) and colonoscopy. No dysphagia. No unexplained weight loss.   On Nexium, sometimes taking two but down to mainly one a day. Nexium is the only thing that really helps her. Dr. Anastasio Champion following for weight management. Eats popcorn and junk before bed. Feels like sometimes she may have to throw up after eating. Feels it coming up in her throat.   Occasional rectal itching, states something "hangs out" occasionally. Declining rectal exam.   Past Medical History:  Diagnosis Date  . Anxiety   . High cholesterol   . Hypertension     Past Surgical History:  Procedure Laterality Date  . ECTOPIC PREGNANCY SURGERY    . ELBOW FRACTURE SURGERY    . HIATAL HERNIA REPAIR    . JOINT REPLACEMENT     bilateral knee  . TONSILLECTOMY      Current Outpatient Prescriptions  Medication Sig Dispense Refill  . amLODipine (NORVASC) 2.5 MG tablet Take 2.5 mg by mouth daily.    [name] aspirin 81 MG tablet Take 81 mg by mouth daily.    [name] atorvastatin (LIPITOR) 40 MG tablet Take 40 mg by mouth daily.    . Cholecalciferol (VITAMIN D) 2000 units CAPS Take 1,000 Units by mouth daily.     [name] escitalopram (LEXAPRO) 10 MG tablet Take 10 mg by mouth daily.    [name] esomeprazole (NEXIUM) 20 MG capsule Take 20 mg by mouth daily at 12 noon.    . gabapentin (NEURONTIN) 100 MG capsule Take 100 mg by mouth 3  (three) times daily. On titration schedule. Increase to a max of 3 capsules 3 times a day    . Melatonin 3 MG TABS Take 1 tablet by mouth at bedtime.    . Omega-3 Fatty Acids (SUPER OMEGA-3 PO) Take by mouth 2 (two) times daily.    [name] OVER THE COUNTER MEDICATION Bifido GI Balance daily    . hydrocortisone (ANUSOL-HC) 2.5 % rectal cream Place 1 application rectally 2 (two) times daily. 30 g 1   No current facility-administered medications for this visit.     Allergies as of 10/27/2016  . (No Known Allergies)    Family History  Problem Relation Age of Onset  . Colon cancer Neg Hx     Social History   Social History  . Marital status: Widowed    Spouse name: N/A  . Number of children: N/A  . Years of education: N/A   Occupational History  . Not on file.   Social History Main Topics  . Smoking status: Never Smoker  . Smokeless tobacco: Never Used  . Alcohol use No  . Drug use: No  . Sexual activity: Not on file   Other Topics Concern  . Not on file   Social History Narrative  . No narrative on file  Review of Systems: As mentioned in HPI   Physical Exam: BP (!) 154/95   Pulse 81   Temp 97.7 F (36.5 C) (Oral)   Ht 5\' 1"  (1.549 m)   Wt 232 lb 9.6 oz (105.5 kg)   BMI 43.95 kg/m  General:   Alert and oriented. Pleasant and cooperative. Well-nourished and well-developed.  Head:  Normocephalic and atraumatic. Eyes:  Without icterus, sclera clear and conjunctiva pink.  Ears:  Normal auditory acuity. Nose:  No deformity, discharge,  or lesions. Mouth:  No deformity or lesions, oral mucosa pink.  Lungs:  Clear to auscultation bilaterally. No wheezes, rales, or rhonchi. No distress.  Heart:  S1, S2 present without murmurs appreciated.  Abdomen:  +BS, soft, non-tender and non-distended. No HSM noted. No guarding or rebound. No masses appreciated.  Rectal:  Deferred  Msk:  Symmetrical without gross deformities. Normal posture. Extremities:  Without  edema. Neurologic:  Alert and  oriented x4;  grossly normal neurologically. Psych:  Alert and cooperative. Normal mood and affect.

## 2016-10-27 NOTE — Progress Notes (Signed)
CC'ED TO PCP 

## 2016-10-27 NOTE — Patient Instructions (Addendum)
Start taking Nexium once each morning 30 minutes before breakfast. At night, you can take Zantac as needed at bedtime. Don't take every day, because you can build up a tolerance.  Don't eat late at night. Wait at least 3-4 hours before going to bed.  Call if things worsen, otherwise follow the diet from Dr. Anastasio Champion and review this reflux handout.  I will get the reports from your hernia surgery and the endoscopy/colonoscopy reports.  We will see you in 8 weeks!   Food Choices for Gastroesophageal Reflux Disease, Adult When you have gastroesophageal reflux disease (GERD), the foods you eat and your eating habits are very important. Choosing the right foods can help ease the discomfort of GERD. Consider working with a diet and nutrition specialist (dietitian) to help you make healthy food choices. What general guidelines should I follow? Eating plan   Choose healthy foods low in fat, such as fruits, vegetables, whole grains, low-fat dairy products, and lean meat, fish, and poultry.  Eat frequent, small meals instead of three large meals each day. Eat your meals slowly, in a relaxed setting. Avoid bending over or lying down until 2-3 hours after eating.  Limit high-fat foods such as fatty meats or fried foods.  Limit your intake of oils, butter, and shortening to less than 8 teaspoons each day.  Avoid the following:  Foods that cause symptoms. These may be different for different people. Keep a food diary to keep track of foods that cause symptoms.  Alcohol.  Drinking large amounts of liquid with meals.  Eating meals during the 2-3 hours before bed.  Cook foods using methods other than frying. This may include baking, grilling, or broiling. Lifestyle    Maintain a healthy weight. Ask your health care provider what weight is healthy for you. If you need to lose weight, work with your health care provider to do so safely.  Exercise for at least 30 minutes on 5 or more days each  week, or as told by your health care provider.  Avoid wearing clothes that fit tightly around your waist and chest.  Do not use any products that contain nicotine or tobacco, such as cigarettes and e-cigarettes. If you need help quitting, ask your health care provider.  Sleep with the head of your bed raised. Use a wedge under the mattress or blocks under the bed frame to raise the head of the bed. What foods are not recommended? The items listed may not be a complete list. Talk with your dietitian about what dietary choices are best for you. Grains  Pastries or quick breads with added fat. [place] toast. Vegetables  Deep fried vegetables. [place] fries. Any vegetables prepared with added fat. Any vegetables that cause symptoms. For some people this may include tomatoes and tomato products, chili peppers, onions and garlic, and horseradish. Fruits  Any fruits prepared with added fat. Any fruits that cause symptoms. For some people this may include citrus fruits, such as oranges, grapefruit, pineapple, and lemons. Meats and other protein foods  High-fat meats, such as fatty beef or pork, hot dogs, ribs, ham, sausage, salami and bacon. Fried meat or protein, including fried fish and fried chicken. Nuts and nut butters. Dairy  Whole milk and chocolate milk. Sour cream. Cream. Ice cream. Cream cheese. Milk shakes. Beverages  Coffee and tea, with or without caffeine. Carbonated beverages. Sodas. Energy drinks. Fruit juice made with acidic fruits (such as orange or grapefruit). Tomato juice. Alcoholic drinks. Fats and oils  Butter.  Margarine. Shortening. Ghee. Sweets and desserts  Chocolate and cocoa. Donuts. Seasoning and other foods  Pepper. Peppermint and spearmint. Any condiments, herbs, or seasonings that cause symptoms. For some people, this may include curry, hot sauce, or vinegar-based salad dressings. Summary  When you have gastroesophageal reflux disease (GERD), food and lifestyle  choices are very important to help ease the discomfort of GERD.  Eat frequent, small meals instead of three large meals each day. Eat your meals slowly, in a relaxed setting. Avoid bending over or lying down until 2-3 hours after eating.  Limit high-fat foods such as fatty meat or fried foods. This information is not intended to replace advice given to you by your health care provider. Make sure you discuss any questions you have with your health care provider. Document Released: 08/15/2005 Document Revised: 08/16/2016 Document Reviewed: 08/16/2016 Elsevier Interactive Patient Education  2017 [employer].

## 2016-11-04 DIAGNOSIS — Z6841 Body Mass Index (BMI) 40.0 and over, adult: Secondary | ICD-10-CM | POA: Diagnosis not present

## 2016-11-04 DIAGNOSIS — I10 Essential (primary) hypertension: Secondary | ICD-10-CM | POA: Diagnosis not present

## 2016-11-04 DIAGNOSIS — R7303 Prediabetes: Secondary | ICD-10-CM | POA: Diagnosis not present

## 2016-11-04 DIAGNOSIS — E784 Other hyperlipidemia: Secondary | ICD-10-CM | POA: Diagnosis not present

## 2016-11-08 NOTE — Progress Notes (Signed)
Requested records from Dr. Glennis Brink office but was told patient was not in their system. Will see her in follow-up as planned. If there is any way she can get these records from the hernia repair, that would be great. Also, any luck with the EGD/colonoscopy reports from Dr. Cristine Polio in [place]?

## 2016-11-08 NOTE — Progress Notes (Signed)
I re-faxed the release to Dr Clarita Crane office.

## 2016-11-09 ENCOUNTER — Ambulatory Visit: Payer: Medicare Other | Admitting: Orthopaedic Surgery

## 2016-11-09 DIAGNOSIS — I119 Hypertensive heart disease without heart failure: Secondary | ICD-10-CM | POA: Diagnosis not present

## 2016-11-15 ENCOUNTER — Ambulatory Visit: Payer: Medicare Other | Admitting: Orthopaedic Surgery

## 2016-11-17 ENCOUNTER — Ambulatory Visit (INDEPENDENT_AMBULATORY_CARE_PROVIDER_SITE_OTHER): Payer: Medicare Other | Admitting: Orthopaedic Surgery

## 2016-11-17 ENCOUNTER — Encounter: Payer: Self-pay | Admitting: Orthopaedic Surgery

## 2016-11-17 VITALS — BP 140/85 | HR 102 | Temp 97.7°F | Ht 61.0 in | Wt 225.0 lb

## 2016-11-17 DIAGNOSIS — M25571 Pain in right ankle and joints of right foot: Secondary | ICD-10-CM | POA: Diagnosis not present

## 2016-11-17 NOTE — Progress Notes (Signed)
Patient Annette Saunders, female DOB:1946-04-13, 71 y.o. OMB:[id number]  Chief Complaint  Patient presents with  . Follow-up    right foot pain    HPI  Annette Saunders is a 71 y.o. female who has pain of the right lateral foot at the base of the fifth metatarsal.  She has no swelling, no redness.  She has pain with weight bearing and many shoes she cannot wear.  She has no trauma. HPI  Body mass index is 42.51 kg/m.  ROS  Review of Systems  Constitutional:       Patient does not have diabetes Patient has hypertension Patient does not have COPD Patient does not smoke.  HENT: Negative for congestion.   Respiratory: Negative for cough and shortness of breath.   Cardiovascular: Negative for chest pain.  Musculoskeletal: Positive for back pain and myalgias.  Allergic/Immunologic: Negative for environmental allergies.  Psychiatric/Behavioral: The patient is nervous/anxious.   All other systems reviewed and are negative.   Past Medical History:  Diagnosis Date  . Anxiety   . High cholesterol   . Hypertension     Past Surgical History:  Procedure Laterality Date  . ECTOPIC PREGNANCY SURGERY    . ELBOW FRACTURE SURGERY    . HIATAL HERNIA REPAIR    . JOINT REPLACEMENT     bilateral knee  . TONSILLECTOMY      Family History  Problem Relation Age of Onset  . Colon cancer Neg Hx     Social History Social History  Substance Use Topics  . Smoking status: Never Smoker  . Smokeless tobacco: Never Used  . Alcohol use No    No Known Allergies  Current Outpatient Prescriptions  Medication Sig Dispense Refill  . amLODipine (NORVASC) 2.5 MG tablet Take 2.5 mg by mouth daily.    [name] aspirin 81 MG tablet Take 81 mg by mouth daily.    [name] atorvastatin (LIPITOR) 40 MG tablet Take 40 mg by mouth daily.    . Cholecalciferol (VITAMIN D) 2000 units CAPS Take 1,000 Units by mouth daily.     [name] escitalopram (LEXAPRO) 10 MG tablet Take 10 mg by mouth daily.    [name] esomeprazole (NEXIUM)  20 MG capsule Take 20 mg by mouth daily at 12 noon.    . gabapentin (NEURONTIN) 100 MG capsule Take 100 mg by mouth 3 (three) times daily. On titration schedule. Increase to a max of 3 capsules 3 times a day    . hydrocortisone (ANUSOL-HC) 2.5 % rectal cream Place 1 application rectally 2 (two) times daily. 30 g 1  . Melatonin 3 MG TABS Take 1 tablet by mouth at bedtime.    . Omega-3 Fatty Acids (SUPER OMEGA-3 PO) Take by mouth 2 (two) times daily.    [name] OVER THE COUNTER MEDICATION Bifido GI Balance daily     No current facility-administered medications for this visit.      Physical Exam  Blood pressure 140/85, pulse (!) 102, temperature 97.7 F (36.5 C), height 5\' 1"  (1.549 m), weight 225 lb (102.1 kg).  Constitutional: overall normal hygiene, normal nutrition, well developed, normal grooming, normal body habitus. Assistive device:none  Musculoskeletal: gait and station Limp right, muscle tone and strength are normal, no tremors or atrophy is present.  .  Neurological: coordination overall normal.  Deep tendon reflex/nerve stretch intact.  Sensation normal.  Cranial nerves II-XII intact.   Skin:   Normal overall no scars, lesions, ulcers or rashes. No psoriasis.  Psychiatric: Alert and oriented x 3.  Recent memory intact, remote memory unclear.  Normal mood and affect. Well groomed.  Good eye contact.  Cardiovascular: overall no swelling, no varicosities, no edema bilaterally, normal temperatures of the legs and arms, no clubbing, cyanosis and good capillary refill.  Lymphatic: palpation is normal.  There is tenderness of the base of the lateral fifth metatarsal on the right foot.  No swelling or redness is present.  NV intact.  The patient has been educated about the nature of the problem(s) and counseled on treatment options.  The patient appeared to understand what I have discussed and is in agreement with it.  Encounter Diagnosis  Name Primary?  . Pain in joint involving  right ankle and foot Yes   Procedure note  After permission from the patient and sterile prep, the lateral portion of the base of the fifth metatarsal on the right foot was injected with 1% Xylocaine and 1 cc of DepoMedrol 40 tolerated well.  PLAN Call if any problems.  Precautions discussed.  Continue current medications.   Return to clinic PRN   Electronically Signed Sanjuana Kava, MD 3/22/201810:12 AM

## 2016-11-28 DIAGNOSIS — I10 Essential (primary) hypertension: Secondary | ICD-10-CM | POA: Diagnosis not present

## 2016-11-28 DIAGNOSIS — R5383 Other fatigue: Secondary | ICD-10-CM | POA: Diagnosis not present

## 2016-12-06 ENCOUNTER — Encounter: Payer: Self-pay | Admitting: Gastroenterology

## 2016-12-06 NOTE — Progress Notes (Signed)
I only received colonoscopy report from March 2016 Dr. Cristine Polio through [facility]. Colonoscopy performed with Propofol, no polyps, small to medium size internal hemorrhoids noted.

## 2016-12-21 ENCOUNTER — Ambulatory Visit: Payer: Medicare Other | Admitting: Gastroenterology

## 2017-01-09 DIAGNOSIS — L820 Inflamed seborrheic keratosis: Secondary | ICD-10-CM | POA: Diagnosis not present

## 2017-01-11 DIAGNOSIS — R7303 Prediabetes: Secondary | ICD-10-CM | POA: Diagnosis not present

## 2017-01-11 DIAGNOSIS — I10 Essential (primary) hypertension: Secondary | ICD-10-CM | POA: Diagnosis not present

## 2017-01-18 ENCOUNTER — Ambulatory Visit (INDEPENDENT_AMBULATORY_CARE_PROVIDER_SITE_OTHER): Payer: Medicare Other | Admitting: Orthopaedic Surgery

## 2017-01-18 ENCOUNTER — Encounter: Payer: Self-pay | Admitting: Orthopaedic Surgery

## 2017-01-18 ENCOUNTER — Ambulatory Visit (INDEPENDENT_AMBULATORY_CARE_PROVIDER_SITE_OTHER): Payer: Medicare Other

## 2017-01-18 ENCOUNTER — Encounter: Payer: Self-pay | Admitting: Gastroenterology

## 2017-01-18 ENCOUNTER — Ambulatory Visit (INDEPENDENT_AMBULATORY_CARE_PROVIDER_SITE_OTHER): Payer: Medicare Other | Admitting: Gastroenterology

## 2017-01-18 VITALS — BP 118/81 | HR 72 | Temp 97.5°F | Ht 61.0 in | Wt 228.0 lb

## 2017-01-18 VITALS — BP 135/79 | HR 84 | Temp 96.9°F | Ht 61.0 in | Wt 228.2 lb

## 2017-01-18 DIAGNOSIS — M25571 Pain in right ankle and joints of right foot: Secondary | ICD-10-CM

## 2017-01-18 DIAGNOSIS — M109 Gout, unspecified: Secondary | ICD-10-CM

## 2017-01-18 DIAGNOSIS — K219 Gastro-esophageal reflux disease without esophagitis: Secondary | ICD-10-CM

## 2017-01-18 NOTE — Progress Notes (Signed)
Referring Provider: Doree Albee, MD Primary Care Physician:  Doree Albee, MD Primary GI: Dr. Oneida Alar   Chief Complaint  Patient presents with  . Gastroesophageal Reflux    HPI:   Annette Saunders is a 71 y.o. female presenting today with a history of GERD, history of large hiatal hernia repair a few years ago by Dr. Duke Salvia, but we have had difficulty obtaining records. Reportedly had an upper endoscopy by Dr. Cristine Polio several years ago, but this was unable to be retrieved as well. Colonoscopy on file from 2016. She is followed by Dr. Anastasio Champion for weight management. At last visit in march, she endorsed eating late before bed.   Lots of gas and bloating after dairy products. However, if she steers clear of lactose products, she does well. She has noticed significant improvement in her GERD symptoms after dietary and behavior modification to include avoidance of fatty foods, avoidance of eating late at night, and only drinking water. She is quite pleased. No alarm symptoms. Taking Nexium once each morning and only rarely has taken Zantac in the evening.   Past Medical History:  Diagnosis Date  . Anxiety   . High cholesterol   . Hypertension     Past Surgical History:  Procedure Laterality Date  . COLONOSCOPY  10/2014   Dr. Cristine Polio, outside hospital. Colonoscopy performed with Propofol, no polyps, small to medium size internal hemorrhoids noted.   [name] ECTOPIC PREGNANCY SURGERY    . ELBOW FRACTURE SURGERY    . HIATAL HERNIA REPAIR    . JOINT REPLACEMENT     bilateral knee  . TONSILLECTOMY      Current Outpatient Prescriptions  Medication Sig Dispense Refill  . amLODipine (NORVASC) 2.5 MG tablet Take 2.5 mg by mouth daily.    [name] aspirin 81 MG tablet Take 81 mg by mouth daily.    [name] atorvastatin (LIPITOR) 40 MG tablet Take 40 mg by mouth daily.    . Cholecalciferol (VITAMIN D) 2000 units CAPS Take 1,000 Units by mouth daily.     [name] escitalopram (LEXAPRO) 10 MG tablet Take 10 mg  by mouth daily.    [name] esomeprazole (NEXIUM) 20 MG capsule Take 20 mg by mouth daily at 12 noon.    . gabapentin (NEURONTIN) 100 MG capsule Take 100 mg by mouth 3 (three) times daily. On titration schedule. Increase to a max of 3 capsules 3 times a day    . Omega-3 Fatty Acids (SUPER OMEGA-3 PO) Take by mouth 2 (two) times daily.    [name] OVER THE COUNTER MEDICATION Bifido GI Balance daily    . hydrocortisone (ANUSOL-HC) 2.5 % rectal cream Place 1 application rectally 2 (two) times daily. (Patient not taking: Reported on 01/18/2017) 30 g 1  . Melatonin 3 MG TABS Take 1 tablet by mouth at bedtime.     No current facility-administered medications for this visit.     Allergies as of 01/18/2017  . (No Known Allergies)    Family History  Problem Relation Age of Onset  . Colon cancer Neg Hx     Social History   Social History  . Marital status: Widowed    Spouse name: N/A  . Number of children: N/A  . Years of education: N/A   Social History Main Topics  . Smoking status: Never Smoker  . Smokeless tobacco: Never Used  . Alcohol use No  . Drug use: No  . Sexual activity: Not Asked   Other Topics Concern  .  None   Social History Narrative  . None    Review of Systems: As mentioned in HPI   Physical Exam: BP 135/79   Pulse 84   Temp (!) 96.9 F (36.1 C) (Oral)   Ht 5\' 1"  (1.549 m)   Wt 228 lb 3.2 oz (103.5 kg)   BMI 43.12 kg/m  General:   Alert and oriented. No distress noted. Pleasant and cooperative.  Head:  Normocephalic and atraumatic. Eyes:  Conjuctiva clear without scleral icterus. Mouth:  Oral mucosa pink and moist. Good dentition. No lesions. Abdomen:  +BS, soft, non-tender and non-distended. No rebound or guarding. No HSM or masses noted. Msk:  Symmetrical without gross deformities. Normal posture. Extremities:  Without edema. Neurologic:  Alert and  oriented x4;  grossly normal neurologically. Psych:  Alert and cooperative. Normal mood and affect.

## 2017-01-18 NOTE — Assessment & Plan Note (Signed)
71 year old female with improvement in symptoms after dietary/behavior modification. Continue taking Nexium once each morning. No need for EGD now unless worsening or recurrence of symptoms. No alarm signs/symptoms. Return in 6 months. Applauded on weight loss efforts.

## 2017-01-18 NOTE — Progress Notes (Signed)
CC'ED TO PCP 

## 2017-01-18 NOTE — Progress Notes (Signed)
Patient ZO:[id number] Annette Saunders, female DOB:May 18, 1946, 71 y.o. UJW:[id number]  Chief Complaint  Patient presents with  . Follow-up    right foot pain    HPI  Annette Saunders is a 71 y.o. female who has been having pain of the right dorsal foot more at night.  She has pain and tingling of the foot at night.  She has no swelling, no redness. She has no trauma.  She does not have diabetes.  She has gout and would like her serum uric acid tested.  I will order this.    I have talked to her about possible peripheral neuropathy.  She does not want to have EMG at this time.  But she may need it in the future and she understands. HPI  Body mass index is 43.08 kg/m.  ROS  Review of Systems  Constitutional:       Patient does not have diabetes Patient has hypertension Patient does not have COPD Patient does not smoke.  HENT: Negative for congestion.   Respiratory: Negative for cough and shortness of breath.   Cardiovascular: Negative for chest pain.  Musculoskeletal: Positive for back pain, gait problem and myalgias.  Allergic/Immunologic: Negative for environmental allergies.  Psychiatric/Behavioral: The patient is nervous/anxious.   All other systems reviewed and are negative.   Past Medical History:  Diagnosis Date  . Anxiety   . High cholesterol   . Hypertension     Past Surgical History:  Procedure Laterality Date  . COLONOSCOPY  10/2014   Dr. Cristine Polio, outside hospital. Colonoscopy performed with Propofol, no polyps, small to medium size internal hemorrhoids noted.   [name] ECTOPIC PREGNANCY SURGERY    . ELBOW FRACTURE SURGERY    . HIATAL HERNIA REPAIR    . JOINT REPLACEMENT     bilateral knee  . TONSILLECTOMY      Family History  Problem Relation Age of Onset  . Colon cancer Neg Hx     Social History Social History  Substance Use Topics  . Smoking status: Never Smoker  . Smokeless tobacco: Never Used  . Alcohol use No    No Known Allergies  Current Outpatient  Prescriptions  Medication Sig Dispense Refill  . amLODipine (NORVASC) 2.5 MG tablet Take 2.5 mg by mouth daily.    [name] aspirin 81 MG tablet Take 81 mg by mouth daily.    [name] atorvastatin (LIPITOR) 40 MG tablet Take 40 mg by mouth daily.    . Cholecalciferol (VITAMIN D) 2000 units CAPS Take 1,000 Units by mouth daily.     [name] escitalopram (LEXAPRO) 10 MG tablet Take 10 mg by mouth daily.    [name] esomeprazole (NEXIUM) 20 MG capsule Take 20 mg by mouth daily at 12 noon.    . gabapentin (NEURONTIN) 100 MG capsule Take 100 mg by mouth 3 (three) times daily. On titration schedule. Increase to a max of 3 capsules 3 times a day    . hydrocortisone (ANUSOL-HC) 2.5 % rectal cream Place 1 application rectally 2 (two) times daily. (Patient not taking: Reported on 01/18/2017) 30 g 1  . Melatonin 3 MG TABS Take 1 tablet by mouth at bedtime.    . Omega-3 Fatty Acids (SUPER OMEGA-3 PO) Take by mouth 2 (two) times daily.    [name] OVER THE COUNTER MEDICATION Bifido GI Balance daily     No current facility-administered medications for this visit.      Physical Exam  Blood pressure 118/81, pulse 72, temperature 97.5 F (36.4 C), height 5\' 1"  (  1.549 m), weight 228 lb (103.4 kg).  Constitutional: overall normal hygiene, normal nutrition, well developed, normal grooming, normal body habitus. Assistive device:none  Musculoskeletal: gait and station Limp none, muscle tone and strength are normal, no tremors or atrophy is present.  .  Neurological: coordination overall normal.  Deep tendon reflex/nerve stretch intact.  Sensation normal.  Cranial nerves II-XII intact.   Skin:   Normal overall no scars, lesions, ulcers or rashes. No psoriasis.  Psychiatric: Alert and oriented x 3.  Recent memory intact, remote memory unclear.  Normal mood and affect. Well groomed.  Good eye contact.  Cardiovascular: overall no swelling, no varicosities, no edema bilaterally, normal temperatures of the legs and arms, no clubbing, cyanosis  and good capillary refill.  Lymphatic: palpation is normal.  Her right foot has no swelling.  Ankle has full ROM. NV intact. There is no redness.  Gait is normal.  She has no color changes.   The patient has been educated about the nature of the problem(s) and counseled on treatment options.  The patient appeared to understand what I have discussed and is in agreement with it.  Encounter Diagnoses  Name Primary?  . Pain in joint involving right ankle and foot Yes  . Gout of right foot, unspecified cause, unspecified chronicity    X-rays were done of the right foot, reported separately.  PLAN Call if any problems.  Precautions discussed.  Continue current medications.   Return to clinic 1 week   Get serum uric acid level done.  Electronically Signed Sanjuana Kava, MD 5/23/201810:56 AM

## 2017-01-18 NOTE — Patient Instructions (Addendum)
  I will see you back I 6 months. Great job on making the dietary changes! Let me know if anything does not improve or if it worsens.   Here is an Probation officer in town:  Dr. Gers[facility]: [address]  [facility]) [address], [place] [address] [phone] Fax: [phone]

## 2017-01-19 LAB — URIC ACID: URIC ACID, SERUM: 7.4 mg/dL — AB (ref 2.5–7.0)

## 2017-01-25 ENCOUNTER — Encounter: Payer: Self-pay | Admitting: Orthopaedic Surgery

## 2017-01-25 ENCOUNTER — Ambulatory Visit (INDEPENDENT_AMBULATORY_CARE_PROVIDER_SITE_OTHER): Payer: Medicare Other | Admitting: Orthopaedic Surgery

## 2017-01-25 VITALS — BP 131/75 | HR 88 | Temp 97.5°F | Ht 61.0 in | Wt 230.0 lb

## 2017-01-25 DIAGNOSIS — M1A0710 Idiopathic chronic gout, right ankle and foot, without tophus (tophi): Secondary | ICD-10-CM | POA: Diagnosis not present

## 2017-01-25 MED ORDER — ALLOPURINOL 300 MG PO TABS: 300.0000 mg | ORAL_TABLET | Freq: Every day | ORAL | 5 refills | Status: DC

## 2017-01-25 NOTE — Progress Notes (Signed)
Patient Annette Saunders, female DOB:01-27-1946, 71 y.o. JOI:[id number]  Chief Complaint  Patient presents with  . Foot Pain    right    HPI  Annette Saunders is a 71 y.o. female who has gout of the right foot.  Serum uric acid drawn last week was 7.4, elevated.  She is not taking any medicine for this.  I had her come by and get Uloric samples which she took.  She is better.  I have explained what gout is. She will need to be on allopurinol daily.  I will call in Rx for this.  She understands she needs to take it daily. HPI  Body mass index is 43.46 kg/m.  ROS  Review of Systems  Constitutional:       Patient does not have diabetes Patient has hypertension Patient does not have COPD Patient does not smoke.  HENT: Negative for congestion.   Respiratory: Negative for cough and shortness of breath.   Cardiovascular: Negative for chest pain.  Musculoskeletal: Positive for back pain, gait problem and myalgias.  Allergic/Immunologic: Negative for environmental allergies.  Psychiatric/Behavioral: The patient is nervous/anxious.   All other systems reviewed and are negative.   Past Medical History:  Diagnosis Date  . Anxiety   . High cholesterol   . Hypertension     Past Surgical History:  Procedure Laterality Date  . COLONOSCOPY  10/2014   Dr. Cristine Polio, outside hospital. Colonoscopy performed with Propofol, no polyps, small to medium size internal hemorrhoids noted.   [name] ECTOPIC PREGNANCY SURGERY    . ELBOW FRACTURE SURGERY    . HIATAL HERNIA REPAIR    . JOINT REPLACEMENT     bilateral knee  . TONSILLECTOMY      Family History  Problem Relation Age of Onset  . Colon cancer Neg Hx     Social History Social History  Substance Use Topics  . Smoking status: Never Smoker  . Smokeless tobacco: Never Used  . Alcohol use No    No Known Allergies  Current Outpatient Prescriptions  Medication Sig Dispense Refill  . allopurinol (ZYLOPRIM) 300 MG tablet Take 1 tablet  (300 mg total) by mouth daily. 90 tablet 5  . amLODipine (NORVASC) 2.5 MG tablet Take 2.5 mg by mouth daily.    [name] aspirin 81 MG tablet Take 81 mg by mouth daily.    [name] atorvastatin (LIPITOR) 40 MG tablet Take 40 mg by mouth daily.    . Cholecalciferol (VITAMIN D) 2000 units CAPS Take 1,000 Units by mouth daily.     [name] escitalopram (LEXAPRO) 10 MG tablet Take 10 mg by mouth daily.    [name] esomeprazole (NEXIUM) 20 MG capsule Take 20 mg by mouth daily at 12 noon.    . gabapentin (NEURONTIN) 100 MG capsule Take 100 mg by mouth 3 (three) times daily. On titration schedule. Increase to a max of 3 capsules 3 times a day    . hydrocortisone (ANUSOL-HC) 2.5 % rectal cream Place 1 application rectally 2 (two) times daily. (Patient not taking: Reported on 01/18/2017) 30 g 1  . Melatonin 3 MG TABS Take 1 tablet by mouth at bedtime.    . Omega-3 Fatty Acids (SUPER OMEGA-3 PO) Take by mouth 2 (two) times daily.    [name] OVER THE COUNTER MEDICATION Bifido GI Balance daily     No current facility-administered medications for this visit.      Physical Exam  Blood pressure 131/75, pulse 88, temperature 97.5 F (36.4 C), height 5\' 1"  (  1.549 m), weight 230 lb (104.3 kg).  Constitutional: overall normal hygiene, normal nutrition, well developed, normal grooming, normal body habitus. Assistive device:none  Musculoskeletal: gait and station Limp none, muscle tone and strength are normal, no tremors or atrophy is present.  .  Neurological: coordination overall normal.  Deep tendon reflex/nerve stretch intact.  Sensation normal.  Cranial nerves II-XII intact.   Skin:   Normal overall no scars, lesions, ulcers or rashes. No psoriasis.  Psychiatric: Alert and oriented x 3.  Recent memory intact, remote memory unclear.  Normal mood and affect. Well groomed.  Good eye contact.  Cardiovascular: overall no swelling, no varicosities, no edema bilaterally, normal temperatures of the legs and arms, no clubbing, cyanosis and  good capillary refill.  Lymphatic: palpation is normal.  Her right foot has less swelling today and no redness.  She has no pain.  NV intact.  The patient has been educated about the nature of the problem(s) and counseled on treatment options.  The patient appeared to understand what I have discussed and is in agreement with it.  Encounter Diagnosis  Name Primary?  . Idiopathic chronic gout of right foot without tophus Yes    PLAN Call if any problems.  Precautions discussed.  Continue current medications.   Return to clinic prn   Electronically Signed Sanjuana Kava, MD 5/30/20188:30 AM

## 2017-01-26 ENCOUNTER — Telehealth: Payer: Self-pay | Admitting: Orthopaedic Surgery

## 2017-01-26 NOTE — Telephone Encounter (Signed)
Pt called to let us know that she has changed pharmacies. She now uses [facility] instead of [facility].

## 2017-01-26 NOTE — Telephone Encounter (Signed)
CHANGED IN CHART

## 2017-01-30 DIAGNOSIS — N390 Urinary tract infection, site not specified: Secondary | ICD-10-CM | POA: Diagnosis not present

## 2017-01-30 DIAGNOSIS — Z713 Dietary counseling and surveillance: Secondary | ICD-10-CM | POA: Diagnosis not present

## 2017-01-30 DIAGNOSIS — R5383 Other fatigue: Secondary | ICD-10-CM | POA: Diagnosis not present

## 2017-01-30 DIAGNOSIS — M1009 Idiopathic gout, multiple sites: Secondary | ICD-10-CM | POA: Diagnosis not present

## 2017-01-30 DIAGNOSIS — I119 Hypertensive heart disease without heart failure: Secondary | ICD-10-CM | POA: Diagnosis not present

## 2017-02-08 DIAGNOSIS — M5136 Other intervertebral disc degeneration, lumbar region: Secondary | ICD-10-CM | POA: Diagnosis not present

## 2017-02-08 DIAGNOSIS — M5416 Radiculopathy, lumbar region: Secondary | ICD-10-CM | POA: Diagnosis not present

## 2017-02-08 DIAGNOSIS — Z6841 Body Mass Index (BMI) 40.0 and over, adult: Secondary | ICD-10-CM | POA: Diagnosis not present

## 2017-02-08 DIAGNOSIS — I10 Essential (primary) hypertension: Secondary | ICD-10-CM | POA: Diagnosis not present

## 2017-03-09 ENCOUNTER — Ambulatory Visit: Payer: Self-pay | Admitting: Orthopaedic Surgery

## 2017-03-15 ENCOUNTER — Ambulatory Visit (INDEPENDENT_AMBULATORY_CARE_PROVIDER_SITE_OTHER): Payer: Medicare Other

## 2017-03-15 ENCOUNTER — Ambulatory Visit (INDEPENDENT_AMBULATORY_CARE_PROVIDER_SITE_OTHER): Payer: Medicare Other | Admitting: Orthopaedic Surgery

## 2017-03-15 ENCOUNTER — Encounter: Payer: Self-pay | Admitting: Orthopaedic Surgery

## 2017-03-15 VITALS — BP 159/101 | HR 79 | Temp 96.9°F

## 2017-03-15 DIAGNOSIS — G8929 Other chronic pain: Secondary | ICD-10-CM | POA: Diagnosis not present

## 2017-03-15 DIAGNOSIS — M25561 Pain in right knee: Secondary | ICD-10-CM

## 2017-03-15 NOTE — Progress Notes (Signed)
Annette Saunders, female DOB:18-Jan-1946, 71 y.o. IFO:[id number]  Chief Complaint  Annette presents with  . Knee Pain    right    HPI  Annette Saunders is a 71 y.o. female who has developed pain laterally and slightly anteriorly of the right knee.  She had a total knee replacement eight years ago in [place].  She said she just started having some knee pain about a week ago.  It has gotten slowly worse.  She has no trauma, no redness, no giving way, no fever, no chills. HPI  There is no height or weight on file to calculate BMI.  ROS  Review of Systems  Constitutional:       Annette does not have diabetes Annette has hypertension Annette does not have COPD Annette does not smoke.  HENT: Negative for congestion.   Respiratory: Negative for cough and shortness of breath.   Cardiovascular: Negative for chest pain.  Musculoskeletal: Positive for back pain, gait problem and myalgias.  Allergic/Immunologic: Negative for environmental allergies.  Psychiatric/Behavioral: The Annette is nervous/anxious.   All other systems reviewed and are negative.   Past Medical History:  Diagnosis Date  . Anxiety   . High cholesterol   . Hypertension     Past Surgical History:  Procedure Laterality Date  . COLONOSCOPY  10/2014   Dr. Cristine Polio, outside hospital. Colonoscopy performed with Propofol, no polyps, small to medium size internal hemorrhoids noted.   [name] ECTOPIC PREGNANCY SURGERY    . ELBOW FRACTURE SURGERY    . HIATAL HERNIA REPAIR    . JOINT REPLACEMENT     bilateral knee  . TONSILLECTOMY      Family History  Problem Relation Age of Onset  . Colon cancer Neg Hx     Social History Social History  Substance Use Topics  . Smoking status: Never Smoker  . Smokeless tobacco: Never Used  . Alcohol use No    No Known Allergies  Current Outpatient Prescriptions  Medication Sig Dispense Refill  . allopurinol (ZYLOPRIM) 300 MG tablet Take 1 tablet (300 mg total) by mouth  daily. 90 tablet 5  . amLODipine (NORVASC) 2.5 MG tablet Take 2.5 mg by mouth daily.    [name] aspirin 81 MG tablet Take 81 mg by mouth daily.    [name] atorvastatin (LIPITOR) 40 MG tablet Take 40 mg by mouth daily.    . Cholecalciferol (VITAMIN D) 2000 units CAPS Take 1,000 Units by mouth daily.     [name] escitalopram (LEXAPRO) 10 MG tablet Take 10 mg by mouth daily.    [name] esomeprazole (NEXIUM) 20 MG capsule Take 20 mg by mouth daily at 12 noon.    . gabapentin (NEURONTIN) 100 MG capsule Take 100 mg by mouth 3 (three) times daily. On titration schedule. Increase to a max of 3 capsules 3 times a day    . hydrocortisone (ANUSOL-HC) 2.5 % rectal cream Place 1 application rectally 2 (two) times daily. (Annette not taking: Reported on 01/18/2017) 30 g 1  . Melatonin 3 MG TABS Take 1 tablet by mouth at bedtime.    . Omega-3 Fatty Acids (SUPER OMEGA-3 PO) Take by mouth 2 (two) times daily.    [name] OVER THE COUNTER MEDICATION Bifido GI Balance daily     No current facility-administered medications for this visit.      Physical Exam  Blood pressure (!) 159/101, pulse 79, temperature (!) 96.9 F (36.1 C).  Constitutional: overall normal hygiene, normal nutrition, well developed, normal grooming, normal body  habitus. Assistive device:none  Musculoskeletal: gait and station Limp right, muscle tone and strength are normal, no tremors or atrophy is present.  .  Neurological: coordination overall normal.  Deep tendon reflex/nerve stretch intact.  Sensation normal.  Cranial nerves II-XII intact.   Skin:   Normal overall no scars, lesions, ulcers or rashes. No psoriasis.  Psychiatric: Alert and oriented x 3.  Recent memory intact, remote memory unclear.  Normal mood and affect. Well groomed.  Good eye contact.  Cardiovascular: overall no swelling, no varicosities, no edema bilaterally, normal temperatures of the legs and arms, no clubbing, cyanosis and good capillary refill.  Lymphatic: palpation is normal.  The  right knee has well healed anterior scar. ROM is full.  She has an area of point tenderness of the lateral anterior knee.  The knee is stable.  She limps when first attempting to walk and then does well.  NV intact. There is no swelling, no redness, no crepitus.  The Annette has been educated about the nature of the problem(s) and counseled on treatment options.  The Annette appeared to understand what I have discussed and is in agreement with it.  Encounter Diagnosis  Name Primary?  . Chronic pain of right knee Yes   Procedure note: After permission from the Annette and sterile prep, the area of lateral anterior tenderness of the right knee was injected with 1 % Xylocaine and 1 cc DepoMedrol 40 tolerated well with sterile technique.  PLAN Call if any problems.  Precautions discussed.  Continue current medications.   Return to clinic 1 week   Electronically Signed Sanjuana Kava, MD 7/18/201810:24 AM

## 2017-03-23 ENCOUNTER — Ambulatory Visit: Payer: Medicare Other | Admitting: Orthopaedic Surgery

## 2017-03-29 DIAGNOSIS — E559 Vitamin D deficiency, unspecified: Secondary | ICD-10-CM | POA: Diagnosis not present

## 2017-03-29 DIAGNOSIS — E785 Hyperlipidemia, unspecified: Secondary | ICD-10-CM | POA: Diagnosis not present

## 2017-03-29 DIAGNOSIS — I10 Essential (primary) hypertension: Secondary | ICD-10-CM | POA: Diagnosis not present

## 2017-03-29 DIAGNOSIS — R5383 Other fatigue: Secondary | ICD-10-CM | POA: Diagnosis not present

## 2017-03-30 ENCOUNTER — Ambulatory Visit: Payer: Medicare Other | Admitting: Orthopaedic Surgery

## 2017-04-04 DIAGNOSIS — E785 Hyperlipidemia, unspecified: Secondary | ICD-10-CM | POA: Diagnosis not present

## 2017-04-04 DIAGNOSIS — I10 Essential (primary) hypertension: Secondary | ICD-10-CM | POA: Diagnosis not present

## 2017-04-04 DIAGNOSIS — E039 Hypothyroidism, unspecified: Secondary | ICD-10-CM | POA: Diagnosis not present

## 2017-04-06 ENCOUNTER — Ambulatory Visit (INDEPENDENT_AMBULATORY_CARE_PROVIDER_SITE_OTHER): Payer: Medicare Other | Admitting: Orthopaedic Surgery

## 2017-04-06 ENCOUNTER — Encounter: Payer: Self-pay | Admitting: Orthopaedic Surgery

## 2017-04-06 VITALS — BP 129/77 | HR 69 | Temp 97.3°F | Ht 61.0 in | Wt 231.0 lb

## 2017-04-06 DIAGNOSIS — G8929 Other chronic pain: Secondary | ICD-10-CM

## 2017-04-06 DIAGNOSIS — M25561 Pain in right knee: Secondary | ICD-10-CM | POA: Diagnosis not present

## 2017-04-06 NOTE — Progress Notes (Signed)
Patient Annette Saunders, female DOB:[birth year], 71 y.o. PXT:[id number]  Chief Complaint  Patient presents with  . Follow-up    Right knee    HPI  Annette Saunders is a 71 y.o. female who has some pain of the right knee area anterior.  She is post total knee done elsewhere.  She had an injection of the trigger point last visit which helped.  She is better but still has some pain at times.  She has no new trauma, no redness, no fever or chills. HPI  Body mass index is 43.65 kg/m.  ROS  Review of Systems  Constitutional:       Patient does not have diabetes Patient has hypertension Patient does not have COPD Patient does not smoke.  HENT: Negative for congestion.   Respiratory: Negative for cough and shortness of breath.   Cardiovascular: Negative for chest pain.  Musculoskeletal: Positive for back pain, gait problem and myalgias.  Allergic/Immunologic: Negative for environmental allergies.  Psychiatric/Behavioral: The patient is nervous/anxious.   All other systems reviewed and are negative.   Past Medical History:  Diagnosis Date  . Anxiety   . High cholesterol   . Hypertension     Past Surgical History:  Procedure Laterality Date  . COLONOSCOPY  10/2014   Dr. Cristine Polio, outside hospital. Colonoscopy performed with Propofol, no polyps, small to medium size internal hemorrhoids noted.   [name] ECTOPIC PREGNANCY SURGERY    . ELBOW FRACTURE SURGERY    . HIATAL HERNIA REPAIR    . JOINT REPLACEMENT     bilateral knee  . TONSILLECTOMY      Family History  Problem Relation Age of Onset  . Colon cancer Neg Hx     Social History Social History  Substance Use Topics  . Smoking status: Never Smoker  . Smokeless tobacco: Never Used  . Alcohol use No    No Known Allergies  Current Outpatient Prescriptions  Medication Sig Dispense Refill  . allopurinol (ZYLOPRIM) 300 MG tablet Take 1 tablet (300 mg total) by mouth daily. 90 tablet 5  . amLODipine (NORVASC) 2.5 MG tablet  Take 2.5 mg by mouth daily.    [name] aspirin 81 MG tablet Take 81 mg by mouth daily.    [name] atorvastatin (LIPITOR) 40 MG tablet Take 40 mg by mouth daily.    . Cholecalciferol (VITAMIN D) 2000 units CAPS Take 1,000 Units by mouth daily.     [name] escitalopram (LEXAPRO) 10 MG tablet Take 10 mg by mouth daily.    [name] esomeprazole (NEXIUM) 20 MG capsule Take 20 mg by mouth daily at 12 noon.    . gabapentin (NEURONTIN) 100 MG capsule Take 100 mg by mouth 3 (three) times daily. On titration schedule. Increase to a max of 3 capsules 3 times a day    . hydrocortisone (ANUSOL-HC) 2.5 % rectal cream Place 1 application rectally 2 (two) times daily. (Patient not taking: Reported on 01/18/2017) 30 g 1  . Melatonin 3 MG TABS Take 1 tablet by mouth at bedtime.    . Omega-3 Fatty Acids (SUPER OMEGA-3 PO) Take by mouth 2 (two) times daily.    [name] OVER THE COUNTER MEDICATION Bifido GI Balance daily     No current facility-administered medications for this visit.      Physical Exam  Blood pressure 129/77, pulse 69, temperature (!) 97.3 F (36.3 C), height 5\' 1"  (1.549 m), weight 231 lb (104.8 kg).  Constitutional: overall normal hygiene, normal nutrition, well developed, normal grooming, normal body  habitus. Assistive device:none  Musculoskeletal: gait and station Limp none, muscle tone and strength are normal, no tremors or atrophy is present.  .  Neurological: coordination overall normal.  Deep tendon reflex/nerve stretch intact.  Sensation normal.  Cranial nerves II-XII intact.   Skin:   Normal overall no scars, lesions, ulcers or rashes. No psoriasis.  Psychiatric: Alert and oriented x 3.  Recent memory intact, remote memory unclear.  Normal mood and affect. Well groomed.  Good eye contact.  Cardiovascular: overall no swelling, no varicosities, no edema bilaterally, normal temperatures of the legs and arms, no clubbing, cyanosis and good capillary refill.  Lymphatic: palpation is normal.  Right knee with  well healed midline scar. ROM is full.  NV intact.  She has slight tenderness laterally and anteriorly.  Gait is normal.  The patient has been educated about the nature of the problem(s) and counseled on treatment options.  The patient appeared to understand what I have discussed and is in agreement with it.  Encounter Diagnosis  Name Primary?  . Chronic pain of right knee Yes    PLAN Call if any problems.  Precautions discussed.  Continue current medications.   Return to clinic 1 month   Try BioFreeze to the area as needed.  Electronically Signed Sanjuana Kava, MD 8/9/20189:02 AM

## 2017-04-13 DIAGNOSIS — Z6841 Body Mass Index (BMI) 40.0 and over, adult: Secondary | ICD-10-CM | POA: Diagnosis not present

## 2017-04-14 NOTE — Progress Notes (Signed)
REVIEWED-NO ADDITIONAL RECOMMENDATIONS. 

## 2017-05-04 ENCOUNTER — Ambulatory Visit: Payer: Self-pay | Admitting: Orthopaedic Surgery

## 2017-05-09 ENCOUNTER — Encounter: Payer: Self-pay | Admitting: Orthopaedic Surgery

## 2017-05-09 ENCOUNTER — Ambulatory Visit (INDEPENDENT_AMBULATORY_CARE_PROVIDER_SITE_OTHER): Payer: Medicare Other | Admitting: Orthopaedic Surgery

## 2017-05-09 VITALS — BP 134/88 | HR 89 | Temp 97.2°F | Ht 61.0 in | Wt 227.0 lb

## 2017-05-09 DIAGNOSIS — M25561 Pain in right knee: Secondary | ICD-10-CM

## 2017-05-09 DIAGNOSIS — G8929 Other chronic pain: Secondary | ICD-10-CM

## 2017-05-09 NOTE — Progress Notes (Signed)
Patient LP:[id number] Annette Saunders, female DOB:Jul 29, 1946, 71 y.o. BDZ:[id number]  Chief Complaint  Patient presents with  . Follow-up    knee pain    HPI  Annette Saunders is a 71 y.o. female who continues to have pain in the right knee.  She had a total knee done elsewhere years ago.  She went walking recently to a [employer] and had swelling and pain after she got home.  She says something is not right in the knee.  It feels it is loose at times.  She has no trauma.  Plain x-rays previously looked good with no fracture or obvious loosening.  I will get CBC, sed rate, c-reactive protein and bone scan done.    HPI  Body mass index is 42.89 kg/m.  ROS  Review of Systems  Constitutional:       Patient does not have diabetes Patient has hypertension Patient does not have COPD Patient does not smoke.  HENT: Negative for congestion.   Respiratory: Negative for cough and shortness of breath.   Cardiovascular: Negative for chest pain.  Musculoskeletal: Positive for back pain, gait problem and myalgias.  Allergic/Immunologic: Negative for environmental allergies.  Psychiatric/Behavioral: The patient is nervous/anxious.   All other systems reviewed and are negative.   Past Medical History:  Diagnosis Date  . Anxiety   . High cholesterol   . Hypertension     Past Surgical History:  Procedure Laterality Date  . COLONOSCOPY  10/2014   Dr. Cristine Polio, outside hospital. Colonoscopy performed with Propofol, no polyps, small to medium size internal hemorrhoids noted.   [name] ECTOPIC PREGNANCY SURGERY    . ELBOW FRACTURE SURGERY    . HIATAL HERNIA REPAIR    . JOINT REPLACEMENT     bilateral knee  . TONSILLECTOMY      Family History  Problem Relation Age of Onset  . Colon cancer Neg Hx     Social History Social History  Substance Use Topics  . Smoking status: Never Smoker  . Smokeless tobacco: Never Used  . Alcohol use No    No Known Allergies  Current Outpatient Prescriptions   Medication Sig Dispense Refill  . allopurinol (ZYLOPRIM) 300 MG tablet Take 1 tablet (300 mg total) by mouth daily. 90 tablet 5  . amLODipine (NORVASC) 2.5 MG tablet Take 2.5 mg by mouth daily.    [name] aspirin 81 MG tablet Take 81 mg by mouth daily.    [name] atorvastatin (LIPITOR) 40 MG tablet Take 40 mg by mouth daily.    . Cholecalciferol (VITAMIN D) 2000 units CAPS Take 1,000 Units by mouth daily.     [name] escitalopram (LEXAPRO) 10 MG tablet Take 10 mg by mouth daily.    [name] esomeprazole (NEXIUM) 20 MG capsule Take 20 mg by mouth daily at 12 noon.    . gabapentin (NEURONTIN) 100 MG capsule Take 100 mg by mouth 3 (three) times daily. On titration schedule. Increase to a max of 3 capsules 3 times a day    . hydrocortisone (ANUSOL-HC) 2.5 % rectal cream Place 1 application rectally 2 (two) times daily. (Patient not taking: Reported on 01/18/2017) 30 g 1  . Melatonin 3 MG TABS Take 1 tablet by mouth at bedtime.    . Omega-3 Fatty Acids (SUPER OMEGA-3 PO) Take by mouth 2 (two) times daily.    [name] OVER THE COUNTER MEDICATION Bifido GI Balance daily     No current facility-administered medications for this visit.      Physical Exam  Blood pressure 134/88, pulse 89, temperature (!) 97.2 F (36.2 C), height 5\' 1"  (1.549 m), weight 227 lb (103 kg).  Constitutional: overall normal hygiene, normal nutrition, well developed, normal grooming, normal body habitus. Assistive device:none  Musculoskeletal: gait and station Limp right, muscle tone and strength are normal, no tremors or atrophy is present.  .  Neurological: coordination overall normal.  Deep tendon reflex/nerve stretch intact.  Sensation normal.  Cranial nerves II-XII intact.   Skin:   Normal overall no scars, lesions, ulcers or rashes. No psoriasis.  Psychiatric: Alert and oriented x 3.  Recent memory intact, remote memory unclear.  Normal mood and affect. Well groomed.  Good eye contact.  Cardiovascular: overall no swelling, no varicosities,  no edema bilaterally, normal temperatures of the legs and arms, no clubbing, cyanosis and good capillary refill.  Lymphatic: palpation is normal.  Right knee has mid line scar.  There is no effusion, ROM is 0 to 115, knee stable, limp to the right.  Very minimal crepitus present.  The patient has been educated about the nature of the problem(s) and counseled on treatment options.  The patient appeared to understand what I have discussed and is in agreement with it.  Encounter Diagnosis  Name Primary?  . Chronic pain of right knee Yes    PLAN Call if any problems.  Precautions discussed.  Continue current medications.   Return to clinic 1 week   Get labs and bone scan.  Electronically Signed Sanjuana Kava, MD 9/11/20184:13 PM

## 2017-05-10 LAB — CBC
HCT: 39.8 % (ref 35.0–45.0)
Hemoglobin: 13.1 g/dL (ref 11.7–15.5)
MCH: 28.2 pg (ref 27.0–33.0)
MCHC: 32.9 g/dL (ref 32.0–36.0)
MCV: 85.8 fL (ref 80.0–100.0)
MPV: 10.4 fL (ref 7.5–12.5)
Platelets: 400 10*3/uL (ref 140–400)
RBC: 4.64 10*6/uL (ref 3.80–5.10)
RDW: 14.4 % (ref 11.0–15.0)
WBC: 9.1 10*3/uL (ref 3.8–10.8)

## 2017-05-10 LAB — SEDIMENTATION RATE: SED RATE: 56 mm/h — AB (ref 0–30)

## 2017-05-10 LAB — C-REACTIVE PROTEIN: CRP: 6.2 mg/L (ref <8.0)

## 2017-05-11 ENCOUNTER — Encounter (HOSPITAL_COMMUNITY): Payer: Self-pay

## 2017-05-11 ENCOUNTER — Encounter (HOSPITAL_COMMUNITY)
Admission: RE | Admit: 2017-05-11 | Discharge: 2017-05-11 | Disposition: A | Discharge status: Home / Self Care | Payer: Medicare Other | Source: Ambulatory Visit | Attending: Orthopaedic Surgery | Admitting: Orthopaedic Surgery

## 2017-05-11 DIAGNOSIS — M25561 Pain in right knee: Secondary | ICD-10-CM | POA: Diagnosis not present

## 2017-05-11 DIAGNOSIS — G8929 Other chronic pain: Secondary | ICD-10-CM | POA: Diagnosis not present

## 2017-05-11 MED ORDER — TECHNETIUM TC 99M MEDRONATE IV KIT
25.0000 | PACK | Freq: Once | INTRAVENOUS | Status: AC | PRN
  Administered 2017-05-11: 19.8 via INTRAVENOUS

## 2017-05-16 ENCOUNTER — Encounter: Payer: Self-pay | Admitting: Orthopaedic Surgery

## 2017-05-16 ENCOUNTER — Ambulatory Visit (INDEPENDENT_AMBULATORY_CARE_PROVIDER_SITE_OTHER): Payer: Medicare Other | Admitting: Orthopaedic Surgery

## 2017-05-16 VITALS — BP 130/90 | HR 84 | Temp 97.8°F | Ht 61.0 in | Wt 227.0 lb

## 2017-05-16 DIAGNOSIS — M25561 Pain in right knee: Secondary | ICD-10-CM

## 2017-05-16 DIAGNOSIS — G8929 Other chronic pain: Secondary | ICD-10-CM | POA: Diagnosis not present

## 2017-05-16 DIAGNOSIS — Z96651 Presence of right artificial knee joint: Secondary | ICD-10-CM

## 2017-05-16 NOTE — Progress Notes (Signed)
Patient BT:[id number] Annette Saunders, female DOB:17-Oct-1945, 71 y.o. PXT:[id number]  Chief Complaint  Patient presents with  . Knee Pain    right    HPI  Annette Saunders is a 71 y.o. female who had total knee done in [place] years ago.  She fell about a year ago and has had intermittent pain of the knee but it is getting worse.  I had labs done, normal except sed rate was 56.  C-reactive protein was normal.  Bone scan was negative for fracture, loosening and infection.  But she still hurts.  The more she is up on it, the more pain she has.  I would like her seen at [facility].  She is agreeable. HPI  Body mass index is 42.89 kg/m.  ROS  Review of Systems  Constitutional:       Patient does not have diabetes Patient has hypertension Patient does not have COPD Patient does not smoke.  HENT: Negative for congestion.   Respiratory: Negative for cough and shortness of breath.   Cardiovascular: Negative for chest pain.  Musculoskeletal: Positive for back pain, gait problem and myalgias.  Allergic/Immunologic: Negative for environmental allergies.  Psychiatric/Behavioral: The patient is nervous/anxious.   All other systems reviewed and are negative.   Past Medical History:  Diagnosis Date  . Anxiety   . High cholesterol   . Hypertension     Past Surgical History:  Procedure Laterality Date  . COLONOSCOPY  10/2014   Dr. Cristine Polio, outside hospital. Colonoscopy performed with Propofol, no polyps, small to medium size internal hemorrhoids noted.   [name] ECTOPIC PREGNANCY SURGERY    . ELBOW FRACTURE SURGERY    . HIATAL HERNIA REPAIR    . JOINT REPLACEMENT     bilateral knee  . TONSILLECTOMY      Family History  Problem Relation Age of Onset  . Colon cancer Neg Hx     Social History Social History  Substance Use Topics  . Smoking status: Never Smoker  . Smokeless tobacco: Never Used  . Alcohol use No    No Known Allergies  Current Outpatient Prescriptions  Medication Sig  Dispense Refill  . allopurinol (ZYLOPRIM) 300 MG tablet Take 1 tablet (300 mg total) by mouth daily. 90 tablet 5  . amLODipine (NORVASC) 2.5 MG tablet Take 2.5 mg by mouth daily.    [name] aspirin 81 MG tablet Take 81 mg by mouth daily.    [name] atorvastatin (LIPITOR) 40 MG tablet Take 40 mg by mouth daily.    . Cholecalciferol (VITAMIN D) 2000 units CAPS Take 1,000 Units by mouth daily.     [name] escitalopram (LEXAPRO) 10 MG tablet Take 10 mg by mouth daily.    [name] esomeprazole (NEXIUM) 20 MG capsule Take 20 mg by mouth daily at 12 noon.    . gabapentin (NEURONTIN) 100 MG capsule Take 100 mg by mouth 3 (three) times daily. On titration schedule. Increase to a max of 3 capsules 3 times a day    . hydrocortisone (ANUSOL-HC) 2.5 % rectal cream Place 1 application rectally 2 (two) times daily. (Patient not taking: Reported on 01/18/2017) 30 g 1  . Melatonin 3 MG TABS Take 1 tablet by mouth at bedtime.    . Omega-3 Fatty Acids (SUPER OMEGA-3 PO) Take by mouth 2 (two) times daily.    [name] OVER THE COUNTER MEDICATION Bifido GI Balance daily     No current facility-administered medications for this visit.      Physical Exam  Blood  pressure 130/90, pulse 84, temperature 97.8 F (36.6 C), height 5\' 1"  (1.549 m), weight 227 lb (103 kg).  Constitutional: overall normal hygiene, normal nutrition, well developed, normal grooming, normal body habitus. Assistive device:none  Musculoskeletal: gait and station Limp none, muscle tone and strength are normal, no tremors or atrophy is present.  .  Neurological: coordination overall normal.  Deep tendon reflex/nerve stretch intact.  Sensation normal.  Cranial nerves II-XII intact.   Skin:   Normal overall no scars, lesions, ulcers or rashes. No psoriasis.  Psychiatric: Alert and oriented x 3.  Recent memory intact, remote memory unclear.  Normal mood and affect. Well groomed.  Good eye contact.  Cardiovascular: overall no swelling, no varicosities, no edema bilaterally,  normal temperatures of the legs and arms, no clubbing, cyanosis and good capillary refill.  Lymphatic: palpation is normal.  All other systems reviewed and are negative   She has very good ROM of the right knee.  Well healed anterior surgical scar is present. She has no limp today, no redness, no increased warmth.  NV intact. She says it hurts more with full flexion.  She has no effusion.  The patient has been educated about the nature of the problem(s) and counseled on treatment options.  The patient appeared to understand what I have discussed and is in agreement with it.  Encounter Diagnoses  Name Primary?  . Chronic pain of right knee Yes  . Status post total knee replacement, right     PLAN Call if any problems.  Precautions discussed.  Continue current medications.   Return to clinic to [place], MD 9/18/20188:28 AM

## 2017-05-18 ENCOUNTER — Ambulatory Visit: Payer: Self-pay | Admitting: Orthopaedic Surgery

## 2017-05-18 DIAGNOSIS — Z6841 Body Mass Index (BMI) 40.0 and over, adult: Secondary | ICD-10-CM | POA: Diagnosis not present

## 2017-05-22 DIAGNOSIS — E2839 Other primary ovarian failure: Secondary | ICD-10-CM | POA: Diagnosis not present

## 2017-05-22 DIAGNOSIS — E039 Hypothyroidism, unspecified: Secondary | ICD-10-CM | POA: Diagnosis not present

## 2017-05-23 DIAGNOSIS — Z23 Encounter for immunization: Secondary | ICD-10-CM | POA: Diagnosis not present

## 2017-06-12 DIAGNOSIS — L237 Allergic contact dermatitis due to plants, except food: Secondary | ICD-10-CM | POA: Diagnosis not present

## 2017-06-22 DIAGNOSIS — L255 Unspecified contact dermatitis due to plants, except food: Secondary | ICD-10-CM | POA: Diagnosis not present

## 2017-07-18 DIAGNOSIS — E039 Hypothyroidism, unspecified: Secondary | ICD-10-CM | POA: Diagnosis not present

## 2017-07-18 DIAGNOSIS — I10 Essential (primary) hypertension: Secondary | ICD-10-CM | POA: Diagnosis not present

## 2017-07-18 DIAGNOSIS — R6884 Jaw pain: Secondary | ICD-10-CM | POA: Diagnosis not present

## 2017-07-27 ENCOUNTER — Ambulatory Visit: Payer: Medicare Other | Admitting: Gastroenterology

## 2017-07-31 DIAGNOSIS — B079 Viral wart, unspecified: Secondary | ICD-10-CM | POA: Diagnosis not present

## 2017-07-31 DIAGNOSIS — N62 Hypertrophy of breast: Secondary | ICD-10-CM | POA: Diagnosis not present

## 2017-07-31 DIAGNOSIS — E039 Hypothyroidism, unspecified: Secondary | ICD-10-CM | POA: Diagnosis not present

## 2017-08-03 ENCOUNTER — Ambulatory Visit: Payer: Self-pay | Admitting: Cardiology

## 2017-08-16 DIAGNOSIS — L820 Inflamed seborrheic keratosis: Secondary | ICD-10-CM | POA: Diagnosis not present

## 2017-09-04 DIAGNOSIS — E039 Hypothyroidism, unspecified: Secondary | ICD-10-CM | POA: Diagnosis not present

## 2017-09-11 ENCOUNTER — Encounter: Payer: Self-pay | Admitting: Cardiology

## 2017-09-11 NOTE — Progress Notes (Signed)
Cardiology Office Note  Date: 09/12/2017   ID: Annette Saunders, DOB 1945/11/02, MRN [id number]  PCP: Annette Albee, MD  Consulting Cardiologist: Annette Lesches, MD   Chief Complaint  Patient presents with  . History of chest and neck discomfort    History of Present Illness: Annette Saunders is a 72 y.o. female referred for cardiology consultation by Dr. Anastasio Saunders for evaluation of neck and upper chest discomfort. Patient states that back in late November to early December she decided to come off of her Nexium. For a period 3 days she did this and then started to experience discomfort in her neck and upper chest, describes a burning, pressure-like sensation. This was not specifically exertional. She was seen by Dr. Anastasio Saunders and noted to have an abnormal ECG with transient anterolateral T wave inversions. Since that time she has resumed Nexium and has had no further symptoms. She does report dyspnea on exertion when she goes on her walks, but states that this is not new. She has had no recurring chest discomfort.  I personally reviewed her tracing from 07/18/2017 which showed sinus rhythm with anterolateral T-wave inversions. Follow-up tracing from 07/30/2017 showed normalization of T-wave abnormalities.  Cardiac risk factors include hyperlipidemia and hypertension. She states that she underwent a treadmill test greater than 20 years ago, does not recall any abnormalities. She has not had any repeat ischemic testing.  She has been trying to walk for exercise and lose weight although has history of bilateral knee replacements and is having recurring knee pain after a fall.  Past Medical History:  Diagnosis Date  . Anxiety   . Generalized osteoarthritis   . GERD (gastroesophageal reflux disease)   . Hyperlipidemia   . Hypertension   . Hypothyroidism   . Insomnia     Past Surgical History:  Procedure Laterality Date  . COLONOSCOPY  10/2014   Dr. Cristine Saunders, outside hospital. Colonoscopy  performed with Propofol, no polyps, small to medium size internal hemorrhoids noted.   Annette Saunders ECTOPIC PREGNANCY SURGERY    . ELBOW FRACTURE SURGERY    . HIATAL HERNIA REPAIR    . JOINT REPLACEMENT     bilateral knee  . TONSILLECTOMY      Current Outpatient Medications  Medication Sig Dispense Refill  . amLODipine (NORVASC) 5 MG tablet Take 5 mg by mouth daily.    Annette Saunders THYROID 15 MG tablet Take 3 tablets by mouth daily.  2  . aspirin 81 MG tablet Take 81 mg by mouth daily.    . Cholecalciferol (VITAMIN D) 2000 units CAPS Take 1,000 Units by mouth daily.     Annette Saunders escitalopram (LEXAPRO) 10 MG tablet Take 10 mg by mouth daily.    Annette Saunders esomeprazole (NEXIUM) 20 MG capsule Take 20 mg by mouth daily at 12 noon.    . gabapentin (NEURONTIN) 400 MG capsule Take 400 mg by mouth 3 (three) times daily.    Annette Saunders lisinopril (PRINIVIL,ZESTRIL) 20 MG tablet Take 20 mg by mouth daily.    . Melatonin 3 MG TABS Take 1 tablet by mouth at bedtime.    . nitroGLYCERIN (NITROSTAT) 0.4 MG SL tablet DISSOLVE 1 T UNDER THE TONGUE PRF CHEST PAIN  0   No current facility-administered medications for this visit.    Allergies:  Patient has no known allergies.   Social History: The patient  reports that  has never smoked. she has never used smokeless tobacco. She reports that she does not drink alcohol or use drugs.  Family History: The patient's family history is not on file.   ROS:  Please see the history of present illness. Otherwise, complete review of systems is positive for arthritic knee pains.  All other systems are reviewed and negative.   Physical Exam: VS:  BP 138/80   Pulse 75   Ht 5\' 1"  (1.549 m)   Wt 226 lb 12.8 oz (102.9 kg)   SpO2 97%   BMI 42.85 kg/m , BMI Body mass index is 42.85 kg/m.  Wt Readings from Last 3 Encounters:  09/12/17 226 lb 12.8 oz (102.9 kg)  05/16/17 227 lb (103 kg)  05/09/17 227 lb (103 kg)    General: Obese woman, appears comfortable at rest. HEENT: Conjunctiva and lids  normal, oropharynx clear. Neck: Supple, no elevated JVP or carotid bruits, no thyromegaly. Lungs: Clear to auscultation, nonlabored breathing at rest. Cardiac: Regular rate and rhythm, no S3 or significant systolic murmur, no pericardial rub. Abdomen: Soft, nontender, bowel sounds present. Extremities: No pitting edema, distal pulses 2+. Skin: Warm and dry. Musculoskeletal: No kyphosis. Neuropsychiatric: Alert and oriented x3, affect grossly appropriate.  ECG: There are no old tracings available for comparison today.  Recent Labwork: 05/09/2017: Hemoglobin 13.1; Platelets 400  November 2018: BUN 22, creatinine 1.0, potassium 4.5, AST 18, ALT 13, TSH 1.11  Assessment and Plan:  1. History of precordial pain and neck discomfort as discussed above. Based on description this sounds most consistent with GI etiology as she was off Nexium at the time and symptoms resolved when she resumed it. However she does have an abnormal ECG from that time and cardiac risk factors including hypertension and hyperlipidemia. Fortunately, she has not had any recurring symptoms on baseline dyspnea on exertion which she states has not worsened. She has not undergone any ischemic testing in greater than 20 years. We will plan to re-screened with a Lexiscan Myoview.  2. Essential hypertension, currently on lisinopril and Norvasc. Weight loss would also be beneficial.  3. History of diet managed hyperlipidemia.  4. Hypothyroidism, on [place] Thyroid. Recent TSH normal.  Current medicines were reviewed with the patient today.   Orders Placed This Encounter  Procedures  . NM Myocar Multi W/Spect W/Wall Motion / EF    Disposition: Call for test results.  Signed, Annette Sark, MD, [facility] 09/12/2017 11:14 AM    [facility] [address], [place], [place] [address] Phone: [phone]; Fax: [phone]

## 2017-09-12 ENCOUNTER — Ambulatory Visit (INDEPENDENT_AMBULATORY_CARE_PROVIDER_SITE_OTHER): Payer: Medicare Other | Admitting: Cardiology

## 2017-09-12 ENCOUNTER — Telehealth: Payer: Self-pay | Admitting: Cardiology

## 2017-09-12 ENCOUNTER — Encounter: Payer: Self-pay | Admitting: Cardiology

## 2017-09-12 VITALS — BP 138/80 | HR 75 | Ht 61.0 in | Wt 226.8 lb

## 2017-09-12 DIAGNOSIS — E782 Mixed hyperlipidemia: Secondary | ICD-10-CM

## 2017-09-12 DIAGNOSIS — R072 Precordial pain: Secondary | ICD-10-CM | POA: Diagnosis not present

## 2017-09-12 DIAGNOSIS — I10 Essential (primary) hypertension: Secondary | ICD-10-CM

## 2017-09-12 DIAGNOSIS — R9431 Abnormal electrocardiogram [ECG] [EKG]: Secondary | ICD-10-CM

## 2017-09-12 DIAGNOSIS — E039 Hypothyroidism, unspecified: Secondary | ICD-10-CM

## 2017-09-12 NOTE — Telephone Encounter (Signed)
lexiscan at [facility] Jan 32, 2019 Arrive at 9:15am

## 2017-09-12 NOTE — Patient Instructions (Signed)
Medication Instructions:  Your physician recommends that you continue on your current medications as directed. Please refer to the Current Medication list given to you today.  Labwork: NONE  Testing/Procedures: Your physician has requested that you have a lexiscan myoview. For further information please visit [email or web address]. Please follow instruction sheet, as given.  Follow-Up: Your physician recommends that you schedule a follow-up appointment PENDING TEST RESULTS  Any Other Special Instructions Will Be Listed Below (If Applicable).  If you need a refill on your cardiac medications before your next appointment, please call your pharmacy. 

## 2017-09-18 ENCOUNTER — Encounter (HOSPITAL_COMMUNITY): Payer: Self-pay

## 2017-09-18 ENCOUNTER — Encounter (HOSPITAL_COMMUNITY): Payer: Medicare Other

## 2017-10-02 ENCOUNTER — Encounter (HOSPITAL_COMMUNITY)
Admission: RE | Admit: 2017-10-02 | Discharge: 2017-10-02 | Disposition: A | Discharge status: Home / Self Care | Payer: Medicare Other | Source: Ambulatory Visit | Attending: Cardiology | Admitting: Cardiology

## 2017-10-02 ENCOUNTER — Encounter (HOSPITAL_COMMUNITY): Payer: Self-pay

## 2017-10-02 ENCOUNTER — Encounter (HOSPITAL_BASED_OUTPATIENT_CLINIC_OR_DEPARTMENT_OTHER)
Admission: RE | Admit: 2017-10-02 | Discharge: 2017-10-02 | Disposition: A | Discharge status: Home / Self Care | Payer: Medicare Other | Source: Ambulatory Visit | Attending: Cardiology | Admitting: Cardiology

## 2017-10-02 DIAGNOSIS — R072 Precordial pain: Secondary | ICD-10-CM | POA: Diagnosis not present

## 2017-10-02 LAB — NM MYOCAR MULTI W/SPECT W/WALL MOTION / EF
CHL CUP NUCLEAR SSS: 5
CHL CUP RESTING HR STRESS: 77 {beats}/min
LHR: 0.59
LV sys vol: 13 mL
LVDIAVOL: 65 mL (ref 46–106)
NUC STRESS TID: 1
Peak HR: 106 {beats}/min
SDS: 3
SRS: 2

## 2017-10-02 MED ORDER — TECHNETIUM TC 99M TETROFOSMIN IV KIT
10.0000 | PACK | Freq: Once | INTRAVENOUS | Status: AC | PRN
  Administered 2017-10-02: 11 via INTRAVENOUS

## 2017-10-02 MED ORDER — REGADENOSON 0.4 MG/5ML IV SOLN
INTRAVENOUS | Status: AC
  Administered 2017-10-02: 0.4 mg via INTRAVENOUS
  Filled 2017-10-02: qty 5

## 2017-10-02 MED ORDER — SODIUM CHLORIDE 0.9% FLUSH
INTRAVENOUS | Status: AC
  Administered 2017-10-02: 10 mL via INTRAVENOUS
  Filled 2017-10-02: qty 10

## 2017-10-02 MED ORDER — TECHNETIUM TC 99M TETROFOSMIN IV KIT
30.0000 | PACK | Freq: Once | INTRAVENOUS | Status: AC | PRN
  Administered 2017-10-02: 30 via INTRAVENOUS

## 2017-10-03 ENCOUNTER — Telehealth: Payer: Self-pay

## 2017-10-03 NOTE — Telephone Encounter (Signed)
Patient notified. Routed to PCP 

## 2017-10-03 NOTE — Telephone Encounter (Signed)
-----   Message from Acquanetta Chain, LPN sent at 09/29/5870 12:16 PM EST -----   ----- Message ----- From: Satira Sark, MD Sent: 10/03/2017   8:06 AM To: Doree Albee, MD, Merlene Laughter, LPN  Results reviewed.  Please let her know that the stress test was normal.  No further cardiac workup is planned at this point.  Keep follow-up with Dr. Anastasio Champion. A copy of this test should be forwarded to Doree Albee, MD.

## 2017-10-20 ENCOUNTER — Ambulatory Visit: Payer: Medicare Other | Admitting: Gastroenterology

## 2017-11-07 DIAGNOSIS — M545 Low back pain: Secondary | ICD-10-CM | POA: Diagnosis not present

## 2017-11-09 ENCOUNTER — Other Ambulatory Visit (HOSPITAL_COMMUNITY): Payer: Self-pay | Admitting: Internal Medicine

## 2017-11-09 DIAGNOSIS — M545 Low back pain: Secondary | ICD-10-CM

## 2017-11-15 DIAGNOSIS — M8580 Other specified disorders of bone density and structure, unspecified site: Secondary | ICD-10-CM | POA: Diagnosis not present

## 2017-11-15 DIAGNOSIS — E2839 Other primary ovarian failure: Secondary | ICD-10-CM | POA: Diagnosis not present

## 2017-11-15 DIAGNOSIS — G4709 Other insomnia: Secondary | ICD-10-CM | POA: Diagnosis not present

## 2017-11-16 ENCOUNTER — Ambulatory Visit (HOSPITAL_COMMUNITY): Payer: Medicare Other

## 2017-11-21 DIAGNOSIS — M545 Low back pain: Secondary | ICD-10-CM | POA: Diagnosis not present

## 2017-11-24 ENCOUNTER — Ambulatory Visit (HOSPITAL_COMMUNITY): Payer: Medicare Other

## 2017-11-24 ENCOUNTER — Encounter (HOSPITAL_COMMUNITY): Payer: Self-pay

## 2017-12-01 ENCOUNTER — Ambulatory Visit (HOSPITAL_COMMUNITY): Payer: Medicare Other

## 2017-12-11 ENCOUNTER — Ambulatory Visit (HOSPITAL_COMMUNITY): Payer: Medicare Other

## 2017-12-14 ENCOUNTER — Telehealth: Payer: Self-pay

## 2017-12-14 ENCOUNTER — Ambulatory Visit (INDEPENDENT_AMBULATORY_CARE_PROVIDER_SITE_OTHER): Payer: Medicare Other | Admitting: Gastroenterology

## 2017-12-14 ENCOUNTER — Encounter: Payer: Self-pay | Admitting: Gastroenterology

## 2017-12-14 VITALS — BP 142/78 | HR 77 | Temp 97.2°F | Ht 61.0 in | Wt 219.8 lb

## 2017-12-14 DIAGNOSIS — R1310 Dysphagia, unspecified: Secondary | ICD-10-CM

## 2017-12-14 DIAGNOSIS — K219 Gastro-esophageal reflux disease without esophagitis: Secondary | ICD-10-CM | POA: Diagnosis not present

## 2017-12-14 MED ORDER — ESOMEPRAZOLE MAGNESIUM 40 MG PO CPDR: 40.0000 mg | DELAYED_RELEASE_CAPSULE | Freq: Two times a day (BID) | ORAL | 3 refills | Status: DC

## 2017-12-14 NOTE — Assessment & Plan Note (Signed)
Increase Nexium to BID due to refractory symptoms. Discussed dietary and behavior modification. Applauded on weight loss efforts.

## 2017-12-14 NOTE — Assessment & Plan Note (Signed)
Vague dysphagia with solid food. History of large hiatal hernia repair. We have been unable to retrieve outside EGD reports from several years ago. Multifactorial in setting of chronic GERD, possible recurrence of hernia. Wanting to hold on EGD. We will pursue BPE in interim and return in July to schedule EGD.

## 2017-12-14 NOTE — Telephone Encounter (Signed)
Pt called- she wanted to let Vicente Males know that the nexium was only $11.00.   And she wanted to let Manuela Schwartz know that the Doctors name she saw in [place] is Dr.[email or web address] and the number is [phone].

## 2017-12-14 NOTE — Patient Instructions (Signed)
I sent Nexium to your pharmacy to take twice a day, 30 minutes before breakfast and dinner. Let me know if this is not covered well by your insurance, as we could try something else!  I have ordered an xray of your esophagus.  I will see you in July 2019!!  It was a pleasure to see you today. I strive to create trusting relationships with patients to provide genuine, compassionate, and quality care. I value your feedback. If you receive a survey regarding your visit,  I greatly appreciate you taking time to fill this out.   Annitta Needs, PhD, ANP-BC [facility]

## 2017-12-14 NOTE — Progress Notes (Signed)
CC'D TO PCP °

## 2017-12-14 NOTE — Progress Notes (Signed)
Primary Care Physician:  Doree Albee, MD  Primary GI: Dr. Oneida Alar   Chief Complaint  Patient presents with  . Gastroesophageal Reflux    "bubble like feel up in chest at night"    HPI:   Annette Saunders is a 72 y.o. female presenting today with a history of GERD, history of large hiatal hernia repair a few years ago by Dr. Duke Salvia, but we have had difficulty obtaining records. Reportedly had an upper endoscopy by Dr. Cristine Polio several years ago, but this was unable to be retrieved as well. Colonoscopy on file from 2016.  Waking up in the morning, gets dry heaves. Present for a week. Taking Nexium 20 mg once daily, sometimes twice. Only satisfied with soups. Soft foods feel like they fill her up too early. Feels food stacking up. No abdominal pain.    Past Medical History:  Diagnosis Date  . Anxiety   . Generalized osteoarthritis   . GERD (gastroesophageal reflux disease)   . Hyperlipidemia   . Hypertension   . Hypothyroidism   . Insomnia     Past Surgical History:  Procedure Laterality Date  . COLONOSCOPY  10/2014   Dr. Cristine Polio, outside hospital. Colonoscopy performed with Propofol, no polyps, small to medium size internal hemorrhoids noted.   [name] ECTOPIC PREGNANCY SURGERY    . ELBOW FRACTURE SURGERY    . HIATAL HERNIA REPAIR    . JOINT REPLACEMENT     bilateral knee  . TONSILLECTOMY      Current Outpatient Medications  Medication Sig Dispense Refill  . amLODipine (NORVASC) 5 MG tablet Take 5 mg by mouth daily.    Francia Greaves THYROID 30 MG tablet daily.  2  . Cholecalciferol (VITAMIN D) 2000 units CAPS Take 1,000 Units by mouth daily.     [name] escitalopram (LEXAPRO) 10 MG tablet Take 10 mg by mouth daily.    [name] lisinopril (PRINIVIL,ZESTRIL) 20 MG tablet Take 20 mg by mouth daily.    . Melatonin 3 MG TABS Take 1 tablet by mouth at bedtime.    . progesterone (PROMETRIUM) 100 MG capsule at bedtime.  2  . esomeprazole (NEXIUM) 40 MG capsule Take 1 capsule (40 mg total) by mouth 2  (two) times daily before a meal. 60 capsule 3   No current facility-administered medications for this visit.     Allergies as of 12/14/2017  . (No Known Allergies)    Family History  Problem Relation Age of Onset  . Colon cancer Neg Hx     Social History   Socioeconomic History  . Marital status: Widowed    Spouse name: Not on file  . Number of children: Not on file  . Years of education: Not on file  . Highest education level: Not on file  Occupational History  . Not on file  Social Needs  . Financial resource strain: Not on file  . Food insecurity:    Worry: Not on file    Inability: Not on file  . Transportation needs:    Medical: Not on file    Non-medical: Not on file  Tobacco Use  . Smoking status: Never Smoker  . Smokeless tobacco: Never Used  Substance and Sexual Activity  . Alcohol use: No    Alcohol/week: 0.0 oz  . Drug use: No  . Sexual activity: Not on file  Lifestyle  . Physical activity:    Days per week: Not on file    Minutes per session: Not on file  .  Stress: Not on file  Relationships  . Social connections:    Talks on phone: Not on file    Gets together: Not on file    Attends religious service: Not on file    Active member of club or organization: Not on file    Attends meetings of clubs or organizations: Not on file    Relationship status: Not on file  Other Topics Concern  . Not on file  Social History Narrative  . Not on file    Review of Systems: Gen: Denies fever, chills, anorexia. Denies fatigue, weakness, weight loss.  CV: Denies chest pain, palpitations, syncope, peripheral edema, and claudication. Resp: Denies dyspnea at rest, cough, wheezing, coughing up blood, and pleurisy. GI: see HPI  Derm: Denies rash, itching, dry skin Psych: Denies depression, anxiety, memory loss, confusion. No homicidal or suicidal ideation.  Heme: Denies bruising, bleeding, and enlarged lymph nodes.  Physical Exam: BP (!) 142/78   Pulse 77    Temp (!) 97.2 F (36.2 C) (Oral)   Ht 5\' 1"  (1.549 m)   Wt 219 lb 12.8 oz (99.7 kg)   BMI 41.53 kg/m  General:   Alert and oriented. No distress noted. Pleasant and cooperative.  Head:  Normocephalic and atraumatic. Eyes:  Conjuctiva clear without scleral icterus. Mouth:  Oral mucosa pink and moist. Good dentition.  Abdomen:  +BS, soft, non-tender and non-distended. No rebound or guarding. No HSM or masses noted. Msk:  Symmetrical without gross deformities. Normal posture. Extremities:  Without edema. Neurologic:  Alert and  oriented x4 Psych:  Alert and cooperative. Normal mood and affect.

## 2017-12-15 ENCOUNTER — Ambulatory Visit (HOSPITAL_COMMUNITY): Payer: Medicare Other

## 2017-12-19 ENCOUNTER — Ambulatory Visit (HOSPITAL_COMMUNITY)
Admission: RE | Admit: 2017-12-19 | Discharge: 2017-12-19 | Disposition: A | Discharge status: Home / Self Care | Payer: Medicare Other | Source: Ambulatory Visit | Attending: Gastroenterology | Admitting: Gastroenterology

## 2017-12-19 ENCOUNTER — Ambulatory Visit (HOSPITAL_COMMUNITY)
Admission: RE | Admit: 2017-12-19 | Discharge: 2017-12-19 | Disposition: A | Discharge status: Home / Self Care | Payer: Medicare Other | Source: Ambulatory Visit | Attending: Internal Medicine | Admitting: Internal Medicine

## 2017-12-19 DIAGNOSIS — Z9889 Other specified postprocedural states: Secondary | ICD-10-CM | POA: Diagnosis not present

## 2017-12-19 DIAGNOSIS — R1310 Dysphagia, unspecified: Secondary | ICD-10-CM | POA: Diagnosis not present

## 2017-12-19 DIAGNOSIS — R6881 Early satiety: Secondary | ICD-10-CM | POA: Diagnosis not present

## 2017-12-19 DIAGNOSIS — M47816 Spondylosis without myelopathy or radiculopathy, lumbar region: Secondary | ICD-10-CM | POA: Diagnosis not present

## 2017-12-19 DIAGNOSIS — M545 Low back pain: Secondary | ICD-10-CM | POA: Diagnosis not present

## 2017-12-19 DIAGNOSIS — R140 Abdominal distension (gaseous): Secondary | ICD-10-CM | POA: Diagnosis not present

## 2017-12-19 NOTE — Progress Notes (Signed)
Sent in MyChart: no evidence for recurrent hiatal hernia! I recommend an upper endoscopy, which we can schedule when I see you again!

## 2017-12-20 NOTE — Progress Notes (Signed)
PT is aware and has follow up appt in July.

## 2017-12-25 DIAGNOSIS — R5383 Other fatigue: Secondary | ICD-10-CM | POA: Diagnosis not present

## 2017-12-25 DIAGNOSIS — E2839 Other primary ovarian failure: Secondary | ICD-10-CM | POA: Diagnosis not present

## 2017-12-25 DIAGNOSIS — E559 Vitamin D deficiency, unspecified: Secondary | ICD-10-CM | POA: Diagnosis not present

## 2017-12-25 DIAGNOSIS — M545 Low back pain: Secondary | ICD-10-CM | POA: Diagnosis not present

## 2017-12-25 DIAGNOSIS — E039 Hypothyroidism, unspecified: Secondary | ICD-10-CM | POA: Diagnosis not present

## 2018-01-30 DIAGNOSIS — M545 Low back pain: Secondary | ICD-10-CM | POA: Diagnosis not present

## 2018-01-30 DIAGNOSIS — G4700 Insomnia, unspecified: Secondary | ICD-10-CM | POA: Diagnosis not present

## 2018-01-30 DIAGNOSIS — N390 Urinary tract infection, site not specified: Secondary | ICD-10-CM | POA: Diagnosis not present

## 2018-02-13 DIAGNOSIS — E2839 Other primary ovarian failure: Secondary | ICD-10-CM | POA: Diagnosis not present

## 2018-02-13 DIAGNOSIS — M545 Low back pain: Secondary | ICD-10-CM | POA: Diagnosis not present

## 2018-02-13 DIAGNOSIS — I10 Essential (primary) hypertension: Secondary | ICD-10-CM | POA: Diagnosis not present

## 2018-02-13 DIAGNOSIS — E559 Vitamin D deficiency, unspecified: Secondary | ICD-10-CM | POA: Diagnosis not present

## 2018-02-13 DIAGNOSIS — R5383 Other fatigue: Secondary | ICD-10-CM | POA: Diagnosis not present

## 2018-02-13 DIAGNOSIS — E039 Hypothyroidism, unspecified: Secondary | ICD-10-CM | POA: Diagnosis not present

## 2018-02-25 ENCOUNTER — Encounter (HOSPITAL_COMMUNITY): Payer: Self-pay

## 2018-02-25 ENCOUNTER — Emergency Department (HOSPITAL_COMMUNITY): Payer: No Typology Code available for payment source

## 2018-02-25 ENCOUNTER — Other Ambulatory Visit: Payer: Self-pay

## 2018-02-25 ENCOUNTER — Emergency Department (HOSPITAL_COMMUNITY)
Admission: EM | Admit: 2018-02-25 | Discharge: 2018-02-25 | Disposition: A | Discharge status: Home / Self Care | Payer: No Typology Code available for payment source | Attending: Emergency Medicine | Admitting: Emergency Medicine

## 2018-02-25 DIAGNOSIS — Y92410 Unspecified street and highway as the place of occurrence of the external cause: Secondary | ICD-10-CM | POA: Insufficient documentation

## 2018-02-25 DIAGNOSIS — Y998 Other external cause status: Secondary | ICD-10-CM | POA: Insufficient documentation

## 2018-02-25 DIAGNOSIS — S92501A Displaced unspecified fracture of right lesser toe(s), initial encounter for closed fracture: Secondary | ICD-10-CM

## 2018-02-25 DIAGNOSIS — E039 Hypothyroidism, unspecified: Secondary | ICD-10-CM | POA: Insufficient documentation

## 2018-02-25 DIAGNOSIS — S92511A Displaced fracture of proximal phalanx of right lesser toe(s), initial encounter for closed fracture: Secondary | ICD-10-CM | POA: Diagnosis not present

## 2018-02-25 DIAGNOSIS — S20312A Abrasion of left front wall of thorax, initial encounter: Secondary | ICD-10-CM | POA: Diagnosis not present

## 2018-02-25 DIAGNOSIS — Z96653 Presence of artificial knee joint, bilateral: Secondary | ICD-10-CM | POA: Insufficient documentation

## 2018-02-25 DIAGNOSIS — M25531 Pain in right wrist: Secondary | ICD-10-CM | POA: Diagnosis not present

## 2018-02-25 DIAGNOSIS — S0990XA Unspecified injury of head, initial encounter: Secondary | ICD-10-CM | POA: Diagnosis not present

## 2018-02-25 DIAGNOSIS — Z79899 Other long term (current) drug therapy: Secondary | ICD-10-CM | POA: Diagnosis not present

## 2018-02-25 DIAGNOSIS — M7918 Myalgia, other site: Secondary | ICD-10-CM | POA: Insufficient documentation

## 2018-02-25 DIAGNOSIS — Y9389 Activity, other specified: Secondary | ICD-10-CM | POA: Diagnosis not present

## 2018-02-25 DIAGNOSIS — S6991XA Unspecified injury of right wrist, hand and finger(s), initial encounter: Secondary | ICD-10-CM | POA: Diagnosis not present

## 2018-02-25 DIAGNOSIS — F419 Anxiety disorder, unspecified: Secondary | ICD-10-CM | POA: Diagnosis not present

## 2018-02-25 DIAGNOSIS — M7989 Other specified soft tissue disorders: Secondary | ICD-10-CM | POA: Diagnosis not present

## 2018-02-25 DIAGNOSIS — S199XXA Unspecified injury of neck, initial encounter: Secondary | ICD-10-CM | POA: Diagnosis not present

## 2018-02-25 DIAGNOSIS — S92414A Nondisplaced fracture of proximal phalanx of right great toe, initial encounter for closed fracture: Secondary | ICD-10-CM | POA: Diagnosis not present

## 2018-02-25 DIAGNOSIS — M25539 Pain in unspecified wrist: Secondary | ICD-10-CM | POA: Diagnosis not present

## 2018-02-25 DIAGNOSIS — R079 Chest pain, unspecified: Secondary | ICD-10-CM | POA: Diagnosis not present

## 2018-02-25 DIAGNOSIS — I10 Essential (primary) hypertension: Secondary | ICD-10-CM | POA: Insufficient documentation

## 2018-02-25 DIAGNOSIS — S3991XA Unspecified injury of abdomen, initial encounter: Secondary | ICD-10-CM | POA: Diagnosis not present

## 2018-02-25 DIAGNOSIS — R1013 Epigastric pain: Secondary | ICD-10-CM | POA: Diagnosis not present

## 2018-02-25 DIAGNOSIS — R0789 Other chest pain: Secondary | ICD-10-CM | POA: Diagnosis not present

## 2018-02-25 DIAGNOSIS — R609 Edema, unspecified: Secondary | ICD-10-CM | POA: Diagnosis not present

## 2018-02-25 DIAGNOSIS — T148XXA Other injury of unspecified body region, initial encounter: Secondary | ICD-10-CM | POA: Diagnosis not present

## 2018-02-25 DIAGNOSIS — R1010 Upper abdominal pain, unspecified: Secondary | ICD-10-CM | POA: Diagnosis not present

## 2018-02-25 DIAGNOSIS — S92411A Displaced fracture of proximal phalanx of right great toe, initial encounter for closed fracture: Secondary | ICD-10-CM | POA: Diagnosis not present

## 2018-02-25 DIAGNOSIS — T07XXXA Unspecified multiple injuries, initial encounter: Secondary | ICD-10-CM

## 2018-02-25 DIAGNOSIS — S99921A Unspecified injury of right foot, initial encounter: Secondary | ICD-10-CM | POA: Diagnosis present

## 2018-02-25 DIAGNOSIS — S299XXA Unspecified injury of thorax, initial encounter: Secondary | ICD-10-CM | POA: Diagnosis not present

## 2018-02-25 DIAGNOSIS — S62638A Displaced fracture of distal phalanx of other finger, initial encounter for closed fracture: Secondary | ICD-10-CM | POA: Diagnosis not present

## 2018-02-25 LAB — CBC WITH DIFFERENTIAL/PLATELET
BASOS ABS: 0 10*3/uL (ref 0.0–0.1)
Basophils Relative: 0 %
EOS ABS: 0.3 10*3/uL (ref 0.0–0.7)
Eosinophils Relative: 2 %
HCT: 38.4 % (ref 36.0–46.0)
HEMOGLOBIN: 12.8 g/dL (ref 12.0–15.0)
Lymphocytes Relative: 24 %
Lymphs Abs: 2.7 10*3/uL (ref 0.7–4.0)
MCH: 29.4 pg (ref 26.0–34.0)
MCHC: 33.3 g/dL (ref 30.0–36.0)
MCV: 88.1 fL (ref 78.0–100.0)
MONOS PCT: 9 %
Monocytes Absolute: 1 10*3/uL (ref 0.1–1.0)
NEUTROS PCT: 65 %
Neutro Abs: 7.2 10*3/uL (ref 1.7–7.7)
Platelets: 354 10*3/uL (ref 150–400)
RBC: 4.36 MIL/uL (ref 3.87–5.11)
RDW: 14.2 % (ref 11.5–15.5)
WBC: 11.1 10*3/uL — AB (ref 4.0–10.5)

## 2018-02-25 LAB — COMPREHENSIVE METABOLIC PANEL
ALBUMIN: 3.7 g/dL (ref 3.5–5.0)
ALK PHOS: 68 U/L (ref 38–126)
ALT: 18 U/L (ref 0–44)
ANION GAP: 8 (ref 5–15)
AST: 25 U/L (ref 15–41)
BUN: 22 mg/dL (ref 8–23)
CO2: 22 mmol/L (ref 22–32)
CREATININE: 1.21 mg/dL — AB (ref 0.44–1.00)
Calcium: 9.5 mg/dL (ref 8.9–10.3)
Chloride: 108 mmol/L (ref 98–111)
GFR calc Af Amer: 51 mL/min — ABNORMAL LOW (ref >60)
GFR calc non Af Amer: 44 mL/min — ABNORMAL LOW (ref >60)
GLUCOSE: 104 mg/dL — AB (ref 70–99)
POTASSIUM: 4.3 mmol/L (ref 3.5–5.1)
Sodium: 138 mmol/L (ref 135–145)
Total Bilirubin: 0.7 mg/dL (ref 0.3–1.2)
Total Protein: 7.7 g/dL (ref 6.5–8.1)

## 2018-02-25 LAB — URINALYSIS, ROUTINE W REFLEX MICROSCOPIC
BILIRUBIN URINE: NEGATIVE
Glucose, UA: NEGATIVE mg/dL
HGB URINE DIPSTICK: NEGATIVE
Ketones, ur: 5 mg/dL — AB
Leukocytes, UA: NEGATIVE
Nitrite: NEGATIVE
PH: 6 (ref 5.0–8.0)
Protein, ur: NEGATIVE mg/dL
SPECIFIC GRAVITY, URINE: 1.032 — AB (ref 1.005–1.030)

## 2018-02-25 LAB — TROPONIN I: Troponin I: <0.03 ng/mL (ref <0.03)

## 2018-02-25 MED ORDER — METHOCARBAMOL 750 MG PO TABS: 750.0000 mg | ORAL_TABLET | Freq: Two times a day (BID) | ORAL | 0 refills | Status: DC | PRN

## 2018-02-25 MED ORDER — IOHEXOL 300 MG/ML  SOLN
75.0000 mL | Freq: Once | INTRAMUSCULAR | Status: AC | PRN
  Administered 2018-02-25: 75 mL via INTRAVENOUS

## 2018-02-25 MED ORDER — MORPHINE SULFATE (PF) 2 MG/ML IV SOLN
2.0000 mg | INTRAVENOUS | Status: AC | PRN
  Administered 2018-02-25 (×2): 2 mg via INTRAVENOUS
  Filled 2018-02-25 (×2): qty 1

## 2018-02-25 MED ORDER — HYDROCODONE-ACETAMINOPHEN 5-325 MG PO TABS: ORAL_TABLET | ORAL | 0 refills | Status: DC
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
## 2018-02-25 NOTE — ED Notes (Signed)
Pt unable to provide urine specimen at this time
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
## 2018-02-25 NOTE — ED Notes (Signed)
RPD at bedside 

## 2018-02-25 NOTE — Discharge Instructions (Addendum)
Take the prescriptions as directed.  Apply moist heat or ice to the area(s) of discomfort, for 15 minutes at a time, several times per day for the next few days.  Do not fall asleep on a heating or ice pack. Wash the abraded area(s) gently with soap and water, and pat dry, at least twice a day, and cover with a clean/dry dressing.  Change the dressing whenever it becomes wet or soiled after washing the area with soap and water and patting dry. Wear the walking boot until you are seen in follow up.  Call your regular medical doctor tomorrow to schedule a follow up appointment within the next 3 days. Call your Orthopedic doctor tomorrow to schedule a follow up appointment this week.  Return to the Emergency Department immediately if worsening.

## 2018-02-27 ENCOUNTER — Ambulatory Visit (INDEPENDENT_AMBULATORY_CARE_PROVIDER_SITE_OTHER): Payer: Medicare Other | Admitting: Orthopaedic Surgery

## 2018-02-27 ENCOUNTER — Encounter: Payer: Self-pay | Admitting: Orthopaedic Surgery

## 2018-02-27 VITALS — BP 123/70 | HR 102 | Ht 61.0 in | Wt 207.0 lb

## 2018-02-27 DIAGNOSIS — T07XXXA Unspecified multiple injuries, initial encounter: Secondary | ICD-10-CM

## 2018-02-27 DIAGNOSIS — M545 Low back pain, unspecified: Secondary | ICD-10-CM

## 2018-02-27 DIAGNOSIS — M542 Cervicalgia: Secondary | ICD-10-CM | POA: Diagnosis not present

## 2018-02-27 DIAGNOSIS — S92501A Displaced unspecified fracture of right lesser toe(s), initial encounter for closed fracture: Secondary | ICD-10-CM

## 2018-02-27 DIAGNOSIS — S92414A Nondisplaced fracture of proximal phalanx of right great toe, initial encounter for closed fracture: Secondary | ICD-10-CM

## 2018-02-27 MED ORDER — HYDROCODONE-ACETAMINOPHEN 5-325 MG PO TABS: ORAL_TABLET | ORAL | 0 refills | Status: DC
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
## 2018-03-02 DIAGNOSIS — S80812D Abrasion, left lower leg, subsequent encounter: Secondary | ICD-10-CM | POA: Diagnosis not present

## 2018-03-02 DIAGNOSIS — S50812D Abrasion of left forearm, subsequent encounter: Secondary | ICD-10-CM | POA: Diagnosis not present

## 2018-03-02 DIAGNOSIS — S92511D Displaced fracture of proximal phalanx of right lesser toe(s), subsequent encounter for fracture with routine healing: Secondary | ICD-10-CM | POA: Diagnosis not present

## 2018-03-02 DIAGNOSIS — I10 Essential (primary) hypertension: Secondary | ICD-10-CM | POA: Diagnosis not present

## 2018-03-02 DIAGNOSIS — K219 Gastro-esophageal reflux disease without esophagitis: Secondary | ICD-10-CM | POA: Diagnosis not present

## 2018-03-02 DIAGNOSIS — E039 Hypothyroidism, unspecified: Secondary | ICD-10-CM | POA: Diagnosis not present

## 2018-03-02 DIAGNOSIS — Z96653 Presence of artificial knee joint, bilateral: Secondary | ICD-10-CM | POA: Diagnosis not present

## 2018-03-02 DIAGNOSIS — S92414D Nondisplaced fracture of proximal phalanx of right great toe, subsequent encounter for fracture with routine healing: Secondary | ICD-10-CM | POA: Diagnosis not present

## 2018-03-02 DIAGNOSIS — F419 Anxiety disorder, unspecified: Secondary | ICD-10-CM | POA: Diagnosis not present

## 2018-03-02 DIAGNOSIS — G8911 Acute pain due to trauma: Secondary | ICD-10-CM | POA: Diagnosis not present

## 2018-03-02 DIAGNOSIS — M545 Low back pain: Secondary | ICD-10-CM | POA: Diagnosis not present

## 2018-03-02 DIAGNOSIS — M150 Primary generalized (osteo)arthritis: Secondary | ICD-10-CM | POA: Diagnosis not present

## 2018-03-02 DIAGNOSIS — E785 Hyperlipidemia, unspecified: Secondary | ICD-10-CM | POA: Diagnosis not present

## 2018-03-05 ENCOUNTER — Telehealth: Payer: Self-pay | Admitting: Orthopaedic Surgery

## 2018-03-05 NOTE — Telephone Encounter (Signed)
Patient wants a refill on Methocarbarmol (robaxin) 750 mgs       Sig: Take 1 tablet (750 mg total) by mouth 2 (two) times daily as needed for muscle spasms.    Patient states she uses [facility] on [address]

## 2018-03-06 DIAGNOSIS — S50812D Abrasion of left forearm, subsequent encounter: Secondary | ICD-10-CM | POA: Diagnosis not present

## 2018-03-06 DIAGNOSIS — S80812D Abrasion, left lower leg, subsequent encounter: Secondary | ICD-10-CM | POA: Diagnosis not present

## 2018-03-06 DIAGNOSIS — S92511D Displaced fracture of proximal phalanx of right lesser toe(s), subsequent encounter for fracture with routine healing: Secondary | ICD-10-CM | POA: Diagnosis not present

## 2018-03-06 DIAGNOSIS — G8911 Acute pain due to trauma: Secondary | ICD-10-CM | POA: Diagnosis not present

## 2018-03-06 DIAGNOSIS — S92414D Nondisplaced fracture of proximal phalanx of right great toe, subsequent encounter for fracture with routine healing: Secondary | ICD-10-CM | POA: Diagnosis not present

## 2018-03-06 DIAGNOSIS — M545 Low back pain: Secondary | ICD-10-CM | POA: Diagnosis not present

## 2018-03-06 MED ORDER — METHOCARBAMOL 750 MG PO TABS: 750.0000 mg | ORAL_TABLET | Freq: Two times a day (BID) | ORAL | 1 refills | Status: DC | PRN

## 2018-03-07 DIAGNOSIS — M545 Low back pain: Secondary | ICD-10-CM | POA: Diagnosis not present

## 2018-03-07 DIAGNOSIS — E039 Hypothyroidism, unspecified: Secondary | ICD-10-CM | POA: Diagnosis not present

## 2018-03-07 DIAGNOSIS — E2839 Other primary ovarian failure: Secondary | ICD-10-CM | POA: Diagnosis not present

## 2018-03-07 DIAGNOSIS — M159 Polyosteoarthritis, unspecified: Secondary | ICD-10-CM | POA: Diagnosis not present

## 2018-03-08 DIAGNOSIS — S92511D Displaced fracture of proximal phalanx of right lesser toe(s), subsequent encounter for fracture with routine healing: Secondary | ICD-10-CM | POA: Diagnosis not present

## 2018-03-08 DIAGNOSIS — S80812D Abrasion, left lower leg, subsequent encounter: Secondary | ICD-10-CM | POA: Diagnosis not present

## 2018-03-08 DIAGNOSIS — S92414D Nondisplaced fracture of proximal phalanx of right great toe, subsequent encounter for fracture with routine healing: Secondary | ICD-10-CM | POA: Diagnosis not present

## 2018-03-08 DIAGNOSIS — G8911 Acute pain due to trauma: Secondary | ICD-10-CM | POA: Diagnosis not present

## 2018-03-08 DIAGNOSIS — S50812D Abrasion of left forearm, subsequent encounter: Secondary | ICD-10-CM | POA: Diagnosis not present

## 2018-03-08 DIAGNOSIS — M545 Low back pain: Secondary | ICD-10-CM | POA: Diagnosis not present

## 2018-03-09 DIAGNOSIS — G8911 Acute pain due to trauma: Secondary | ICD-10-CM | POA: Diagnosis not present

## 2018-03-09 DIAGNOSIS — S80812D Abrasion, left lower leg, subsequent encounter: Secondary | ICD-10-CM | POA: Diagnosis not present

## 2018-03-09 DIAGNOSIS — M545 Low back pain: Secondary | ICD-10-CM | POA: Diagnosis not present

## 2018-03-09 DIAGNOSIS — S50812D Abrasion of left forearm, subsequent encounter: Secondary | ICD-10-CM | POA: Diagnosis not present

## 2018-03-09 DIAGNOSIS — S92511D Displaced fracture of proximal phalanx of right lesser toe(s), subsequent encounter for fracture with routine healing: Secondary | ICD-10-CM | POA: Diagnosis not present

## 2018-03-09 DIAGNOSIS — S92414D Nondisplaced fracture of proximal phalanx of right great toe, subsequent encounter for fracture with routine healing: Secondary | ICD-10-CM | POA: Diagnosis not present

## 2018-03-12 DIAGNOSIS — M545 Low back pain: Secondary | ICD-10-CM | POA: Diagnosis not present

## 2018-03-12 DIAGNOSIS — S92511D Displaced fracture of proximal phalanx of right lesser toe(s), subsequent encounter for fracture with routine healing: Secondary | ICD-10-CM | POA: Diagnosis not present

## 2018-03-12 DIAGNOSIS — G8911 Acute pain due to trauma: Secondary | ICD-10-CM | POA: Diagnosis not present

## 2018-03-12 DIAGNOSIS — S50812D Abrasion of left forearm, subsequent encounter: Secondary | ICD-10-CM | POA: Diagnosis not present

## 2018-03-12 DIAGNOSIS — S80812D Abrasion, left lower leg, subsequent encounter: Secondary | ICD-10-CM | POA: Diagnosis not present

## 2018-03-12 DIAGNOSIS — S92414D Nondisplaced fracture of proximal phalanx of right great toe, subsequent encounter for fracture with routine healing: Secondary | ICD-10-CM | POA: Diagnosis not present

## 2018-03-13 ENCOUNTER — Ambulatory Visit (INDEPENDENT_AMBULATORY_CARE_PROVIDER_SITE_OTHER): Payer: Medicare Other

## 2018-03-13 ENCOUNTER — Ambulatory Visit (INDEPENDENT_AMBULATORY_CARE_PROVIDER_SITE_OTHER): Payer: Medicare Other | Admitting: Orthopaedic Surgery

## 2018-03-13 ENCOUNTER — Encounter: Payer: Self-pay | Admitting: Orthopaedic Surgery

## 2018-03-13 VITALS — BP 102/61 | HR 84 | Temp 97.0°F | Ht 61.0 in | Wt 207.0 lb

## 2018-03-13 DIAGNOSIS — G8929 Other chronic pain: Secondary | ICD-10-CM | POA: Diagnosis not present

## 2018-03-13 DIAGNOSIS — M545 Low back pain: Secondary | ICD-10-CM

## 2018-03-13 DIAGNOSIS — S92501D Displaced unspecified fracture of right lesser toe(s), subsequent encounter for fracture with routine healing: Secondary | ICD-10-CM

## 2018-03-13 DIAGNOSIS — M542 Cervicalgia: Secondary | ICD-10-CM | POA: Diagnosis not present

## 2018-03-13 DIAGNOSIS — N390 Urinary tract infection, site not specified: Secondary | ICD-10-CM | POA: Diagnosis not present

## 2018-03-13 DIAGNOSIS — E860 Dehydration: Secondary | ICD-10-CM | POA: Diagnosis not present

## 2018-03-13 DIAGNOSIS — S92414A Nondisplaced fracture of proximal phalanx of right great toe, initial encounter for closed fracture: Secondary | ICD-10-CM

## 2018-03-13 DIAGNOSIS — A088 Other specified intestinal infections: Secondary | ICD-10-CM | POA: Diagnosis not present

## 2018-03-13 DIAGNOSIS — R5383 Other fatigue: Secondary | ICD-10-CM | POA: Diagnosis not present

## 2018-03-13 DIAGNOSIS — I10 Essential (primary) hypertension: Secondary | ICD-10-CM | POA: Diagnosis not present

## 2018-03-13 MED ORDER — HYDROCODONE-ACETAMINOPHEN 5-325 MG PO TABS: ORAL_TABLET | ORAL | 0 refills | Status: DC

## 2018-03-13 NOTE — Progress Notes (Addendum)
Patient Annette Saunders, female DOB:[birth year], 72 y.o. HBZ:[id number]  Chief Complaint  Patient presents with  . Toe Injury    02/25/18 right great toe fracture   . Back Pain  . Ankle Pain    right     HPI  Annette Saunders is a 72 y.o. female who has a fracture of the right great toe.  It is doing well but the CAM walker has malfunctioned.  This will be changed.  She has lower back pain now.  She has pain of the mid lower back in the sacral area.  She has no weakness.  She has pain sitting.    She is getting home health PT.  I will have them work with her neck and her lower back as well.   Body mass index is 39.11 kg/m.  ROS  Review of Systems  Constitutional:       Patient does not have diabetes Patient has hypertension Patient does not have COPD Patient does not smoke.  HENT: Negative for congestion.   Respiratory: Negative for cough and shortness of breath.   Cardiovascular: Negative for chest pain.  Musculoskeletal: Positive for back pain, gait problem and myalgias.  Allergic/Immunologic: Negative for environmental allergies.  Psychiatric/Behavioral: The patient is nervous/anxious.   All other systems reviewed and are negative.   All other systems reviewed and are negative.  Past Medical History:  Diagnosis Date  . Anxiety   . Generalized osteoarthritis   . GERD (gastroesophageal reflux disease)   . Hyperlipidemia   . Hypertension   . Hypothyroidism   . Insomnia     Past Surgical History:  Procedure Laterality Date  . COLONOSCOPY  10/2014   Dr. Cristine Polio, outside hospital. Colonoscopy performed with Propofol, no polyps, small to medium size internal hemorrhoids noted.   [name] ECTOPIC PREGNANCY SURGERY    . ELBOW FRACTURE SURGERY    . HIATAL HERNIA REPAIR    . JOINT REPLACEMENT     bilateral knee  . TONSILLECTOMY      Family History  Problem Relation Age of Onset  . Colon cancer Neg Hx     Social History Social History   Tobacco Use  . Smoking  status: Never Smoker  . Smokeless tobacco: Never Used  Substance Use Topics  . Alcohol use: No    Alcohol/week: 0.0 oz  . Drug use: No    No Known Allergies  Current Outpatient Medications  Medication Sig Dispense Refill  . amLODipine (NORVASC) 5 MG tablet Take 5 mg by mouth daily.    Francia Greaves THYROID 30 MG tablet daily.  2  . Cholecalciferol (VITAMIN D) 2000 units CAPS Take 1,000 Units by mouth daily.     [name] escitalopram (LEXAPRO) 10 MG tablet Take 10 mg by mouth daily.    [name] esomeprazole (NEXIUM) 40 MG capsule Take 1 capsule (40 mg total) by mouth 2 (two) times daily before a meal. 60 capsule 3  . estradiol (ESTRACE) 0.5 MG tablet TK 1 T PO D  2  . estradiol (ESTRACE) 1 MG tablet TK 1 T PO D  3  . gabapentin (NEURONTIN) 400 MG capsule Take 1 capsule by mouth 2 (two) times daily.  4  . HYDROcodone-acetaminophen (NORCO/VICODIN) 5-325 MG tablet One tablet every four hours as needed for acute pain.  Limit of five days per [place] statue. 30 tablet 0  . lisinopril (PRINIVIL,ZESTRIL) 20 MG tablet Take 20 mg by mouth daily.    . methocarbamol (ROBAXIN) 750 MG tablet Take  1 tablet (750 mg total) by mouth 2 (two) times daily as needed for muscle spasms. 30 tablet 1  . NP THYROID 90 MG tablet TK 1 T PO QD  3  . progesterone (PROMETRIUM) 100 MG capsule at bedtime.  2  . progesterone (PROMETRIUM) 200 MG capsule TK 2 CS PO QHS  3   No current facility-administered medications for this visit.      Physical Exam  Blood pressure 102/61, pulse 84, temperature (!) 97 F (36.1 C), height 5\' 1"  (1.549 m), weight 207 lb (93.9 kg).  Constitutional: overall normal hygiene, normal nutrition, well developed, normal grooming, normal body habitus. Assistive device:walker, and CAM walker  Musculoskeletal: gait and station Limp right, muscle tone and strength are normal, no tremors or atrophy is present.  .  Neurological: coordination overall normal.  Deep tendon reflex/nerve stretch intact.  Sensation  normal.  Cranial nerves II-XII intact.   Skin:   Normal overall no scars, lesions, ulcers or rashes. No psoriasis.  Psychiatric: Alert and oriented x 3.  Recent memory intact, remote memory unclear.  Normal mood and affect. Well groomed.  Good eye contact.  Cardiovascular: overall no swelling, no varicosities, no edema bilaterally, normal temperatures of the legs and arms, no clubbing, cyanosis and good capillary refill.  Lymphatic: palpation is normal.  Spine/Pelvis examination:  Inspection:  Overall, sacoiliac joint benign and hips nontender; without crepitus or defects.   Thoracic spine inspection: Alignment normal without kyphosis present   Lumbar spine inspection:  Alignment  with normal lumbar lordosis, without scoliosis apparent.   Thoracic spine palpation:  without tenderness of spinal processes   Lumbar spine palpation: without tenderness of lumbar area; without tightness of lumbar muscles    Range of Motion:   Lumbar flexion, forward flexion is normal without pain or tenderness    Lumbar extension is full without pain or tenderness   Left lateral bend is normal without pain or tenderness   Right lateral bend is normal without pain or tenderness   Straight leg raising is normal  Strength & tone: normal   Stability overall normal stability All other systems reviewed and are negative   The patient has been educated about the nature of the problem(s) and counseled on treatment options.  The patient appeared to understand what I have discussed and is in agreement with it.  Encounter Diagnoses  Name Primary?  . Closed nondisplaced fracture of proximal phalanx of right great toe, initial encounter   . Closed fracture of phalanx of right second toe with routine healing, subsequent encounter Yes  . Chronic midline low back pain without sciatica   . Neck pain     X-rays were done of the lumbar spine, reported separately.  PLAN Call if any problems.  Precautions discussed.   Continue current medications.   Return to clinic 2 weeks   I have reviewed the [place] web site prior to prescribing narcotic medicine for this patient.  Electronically Signed Sanjuana Kava, MD 7/16/201910:37 AM

## 2018-03-13 NOTE — Addendum Note (Signed)
Addended by: Tressa Busman L on: 03/13/2018 10:59 AM   Modules accepted: Orders

## 2018-03-15 ENCOUNTER — Other Ambulatory Visit: Payer: Self-pay

## 2018-03-15 ENCOUNTER — Emergency Department (HOSPITAL_COMMUNITY)
Admission: EM | Admit: 2018-03-15 | Discharge: 2018-03-15 | Disposition: A | Discharge status: Home / Self Care | Payer: Medicare Other | Attending: Emergency Medicine | Admitting: Emergency Medicine

## 2018-03-15 ENCOUNTER — Encounter (HOSPITAL_COMMUNITY): Payer: Self-pay | Admitting: Emergency Medicine

## 2018-03-15 ENCOUNTER — Telehealth: Payer: Self-pay | Admitting: Orthopaedic Surgery

## 2018-03-15 DIAGNOSIS — R197 Diarrhea, unspecified: Secondary | ICD-10-CM | POA: Diagnosis not present

## 2018-03-15 DIAGNOSIS — M545 Low back pain: Secondary | ICD-10-CM | POA: Diagnosis not present

## 2018-03-15 DIAGNOSIS — E860 Dehydration: Secondary | ICD-10-CM | POA: Diagnosis not present

## 2018-03-15 DIAGNOSIS — R42 Dizziness and giddiness: Secondary | ICD-10-CM | POA: Diagnosis not present

## 2018-03-15 DIAGNOSIS — S50812D Abrasion of left forearm, subsequent encounter: Secondary | ICD-10-CM | POA: Diagnosis not present

## 2018-03-15 DIAGNOSIS — E039 Hypothyroidism, unspecified: Secondary | ICD-10-CM | POA: Diagnosis not present

## 2018-03-15 DIAGNOSIS — I10 Essential (primary) hypertension: Secondary | ICD-10-CM | POA: Insufficient documentation

## 2018-03-15 DIAGNOSIS — Z79899 Other long term (current) drug therapy: Secondary | ICD-10-CM | POA: Diagnosis not present

## 2018-03-15 DIAGNOSIS — S92414D Nondisplaced fracture of proximal phalanx of right great toe, subsequent encounter for fracture with routine healing: Secondary | ICD-10-CM | POA: Diagnosis not present

## 2018-03-15 DIAGNOSIS — G8911 Acute pain due to trauma: Secondary | ICD-10-CM | POA: Diagnosis not present

## 2018-03-15 DIAGNOSIS — I959 Hypotension, unspecified: Secondary | ICD-10-CM

## 2018-03-15 DIAGNOSIS — S92511D Displaced fracture of proximal phalanx of right lesser toe(s), subsequent encounter for fracture with routine healing: Secondary | ICD-10-CM | POA: Diagnosis not present

## 2018-03-15 DIAGNOSIS — Z96653 Presence of artificial knee joint, bilateral: Secondary | ICD-10-CM | POA: Diagnosis not present

## 2018-03-15 DIAGNOSIS — S80812D Abrasion, left lower leg, subsequent encounter: Secondary | ICD-10-CM | POA: Diagnosis not present

## 2018-03-15 LAB — URINALYSIS, ROUTINE W REFLEX MICROSCOPIC
Bilirubin Urine: NEGATIVE
GLUCOSE, UA: NEGATIVE mg/dL
Hgb urine dipstick: NEGATIVE
Ketones, ur: NEGATIVE mg/dL
LEUKOCYTES UA: NEGATIVE
Nitrite: NEGATIVE
PROTEIN: NEGATIVE mg/dL
SPECIFIC GRAVITY, URINE: 1.008 (ref 1.005–1.030)
pH: 6 (ref 5.0–8.0)

## 2018-03-15 LAB — BASIC METABOLIC PANEL
Anion gap: 9 (ref 5–15)
BUN: 29 mg/dL — AB (ref 8–23)
CHLORIDE: 106 mmol/L (ref 98–111)
CO2: 21 mmol/L — AB (ref 22–32)
CREATININE: 1.15 mg/dL — AB (ref 0.44–1.00)
Calcium: 9.3 mg/dL (ref 8.9–10.3)
GFR calc Af Amer: 54 mL/min — ABNORMAL LOW (ref >60)
GFR calc non Af Amer: 47 mL/min — ABNORMAL LOW (ref >60)
GLUCOSE: 97 mg/dL (ref 70–99)
Potassium: 4.1 mmol/L (ref 3.5–5.1)
Sodium: 136 mmol/L (ref 135–145)

## 2018-03-15 LAB — CBC
HCT: 34.1 % — ABNORMAL LOW (ref 36.0–46.0)
Hemoglobin: 11.1 g/dL — ABNORMAL LOW (ref 12.0–15.0)
MCH: 28.7 pg (ref 26.0–34.0)
MCHC: 32.6 g/dL (ref 30.0–36.0)
MCV: 88.1 fL (ref 78.0–100.0)
Platelets: 442 10*3/uL — ABNORMAL HIGH (ref 150–400)
RBC: 3.87 MIL/uL (ref 3.87–5.11)
RDW: 13.2 % (ref 11.5–15.5)
WBC: 9.1 10*3/uL (ref 4.0–10.5)

## 2018-03-15 MED ORDER — SODIUM CHLORIDE 0.9 % IV BOLUS
500.0000 mL | Freq: Once | INTRAVENOUS | Status: AC
  Administered 2018-03-15: 500 mL via INTRAVENOUS

## 2018-03-15 NOTE — ED Triage Notes (Signed)
Pt had blood work done at [facility] and was told to come here for dehydration.

## 2018-03-15 NOTE — ED Provider Notes (Signed)
[facility] EMERGENCY DEPARTMENT Provider Note   CSN: [id number] Arrival date & time: 03/15/18  1515     History   Chief Complaint Chief Complaint  Patient presents with  . Dehydration    HPI Annette Saunders is a 72 y.o. female.  HPI Patient sent in from PCPs office.  Reportedly was told that she was dehydrated.  Got seen a couple days ago and reportedly had a low blood pressure at that time.  Reportedly did not want to come into the ER at that time.  States went back today and was told that the blood work also showed she was dehydrated.  Has had some lightheadedness.  Had a fracture in her toesAround 3 weeks ago.  Has been on pain medicines for that.  Now is felt worse with standing.  Previously on antibiotics for urinary tract infection.  States that is all cleared up.  Also states that she had diarrhea while she is on the antibiotics but that also was stopped. Past Medical History:  Diagnosis Date  . Anxiety   . Generalized osteoarthritis   . GERD (gastroesophageal reflux disease)   . Hyperlipidemia   . Hypertension   . Hypothyroidism   . Insomnia     Patient Active Problem List   Diagnosis Date Noted  . Dysphagia 12/14/2017  . GERD (gastroesophageal reflux disease) 10/27/2016    Past Surgical History:  Procedure Laterality Date  . COLONOSCOPY  10/2014   Dr. Cristine Polio, outside hospital. Colonoscopy performed with Propofol, no polyps, small to medium size internal hemorrhoids noted.   [name] ECTOPIC PREGNANCY SURGERY    . ELBOW FRACTURE SURGERY    . HIATAL HERNIA REPAIR    . JOINT REPLACEMENT     bilateral knee  . TONSILLECTOMY       OB History   None      Home Medications    Prior to Admission medications   Medication Sig Start Date End Date Taking? Authorizing Provider  ARMOUR THYROID 60 MG tablet Take 60 mg by mouth daily.  11/07/17  Yes [provider]  bismuth subsalicylate (KAOPECTATE) 262 MG/15ML suspension Take 30 mLs by mouth every 6 (six) hours  as needed for indigestion or diarrhea or loose stools.   Yes [provider]  cholecalciferol (VITAMIN D) 1000 units tablet Take 5,000 Units by mouth daily.    Yes [provider]  ciprofloxacin (CIPRO) 250 MG tablet Take 250 mg by mouth 2 (two) times daily. 5 day course starting on 03/12/2018   Yes [provider]  escitalopram (LEXAPRO) 10 MG tablet Take 10 mg by mouth every evening.    Yes [provider]  esomeprazole (NEXIUM) 40 MG capsule Take 1 capsule (40 mg total) by mouth 2 (two) times daily before a meal. 12/14/17  Yes Annitta Needs, NP  estradiol (ESTRACE) 0.5 MG tablet TAKE ONE TABLET BY MOUTH ONCE DAILY IN THE MORNING 03/10/18  Yes [provider]  gabapentin (NEURONTIN) 400 MG capsule Take 1 capsule by mouth 3 (three) times daily.  01/29/18  Yes [provider]  HYDROcodone-acetaminophen (NORCO/VICODIN) 5-325 MG tablet One tablet every four hours as needed for acute pain.  Limit of five days per [place] statue. Patient taking differently: Take 1 tablet by mouth every 4 (four) hours as needed for moderate pain. Limit of five days per [place] statue. 03/13/18  Yes Sanjuana Kava, MD  methocarbamol (ROBAXIN) 750 MG tablet Take 1 tablet (750 mg total) by mouth 2 (two)  times daily as needed for muscle spasms. 03/06/18  Yes Sanjuana Kava, MD  progesterone (PROMETRIUM) 200 MG capsule TK 2 CS PO QHS 01/30/18  Yes [provider]  estradiol (ESTRACE) 1 MG tablet TK 1 T PO D 02/14/18   [provider]  NP THYROID 90 MG tablet TK 1 T PO QD 02/14/18   [provider]    Family History Family History  Problem Relation Age of Onset  . Colon cancer Neg Hx     Social History Social History   Tobacco Use  . Smoking status: Never Smoker  . Smokeless tobacco: Never Used  Substance Use Topics  . Alcohol use: No    Alcohol/week: 0.0 oz  . Drug use: No     Allergies   Patient has no known allergies.   Review of  Systems Review of Systems  Constitutional: Negative for appetite change.  HENT: Negative for congestion.   Respiratory: Negative for shortness of breath.   Cardiovascular: Negative for chest pain.  Gastrointestinal: Positive for diarrhea. Negative for abdominal pain.  Genitourinary: Negative for flank pain.  Musculoskeletal: Negative for back pain and neck pain.       Right foot pain  Skin: Negative for rash.  Neurological: Positive for light-headedness. Negative for tremors.     Physical Exam Updated Vital Signs BP (!) 114/56 (BP Location: Left Arm)   Pulse 85   Temp 98 F (36.7 C) (Tympanic)   Resp 20   Ht 5\' 1"  (1.549 m)   Wt 93.9 kg (207 lb)   SpO2 99%   BMI 39.11 kg/m   Physical Exam  Constitutional: She appears well-developed.  HENT:  Head: Normocephalic.  Eyes: No scleral icterus.  Neck: Neck supple.  Cardiovascular: Normal rate.  Pulmonary/Chest: Effort normal.  Abdominal: There is no tenderness.  Musculoskeletal:  Right lower leg in walking boot.  Neurological: She is alert.  Skin: Skin is warm. Capillary refill takes less than 2 seconds.     ED Treatments / Results  Labs (all labs ordered are listed, but only abnormal results are displayed) Labs Reviewed  BASIC METABOLIC PANEL - Abnormal; Notable for the following components:      Result Value   CO2 21 (*)    BUN 29 (*)    Creatinine, Ser 1.15 (*)    GFR calc non Af Amer 47 (*)    GFR calc Af Amer 54 (*)    All other components within normal limits  CBC - Abnormal; Notable for the following components:   Hemoglobin 11.1 (*)    HCT 34.1 (*)    Platelets 442 (*)    All other components within normal limits  URINALYSIS, ROUTINE W REFLEX MICROSCOPIC - Abnormal; Notable for the following components:   Color, Urine STRAW (*)    All other components within normal limits    EKG None  Radiology No results found.  Procedures Procedures (including critical care time)  Medications Ordered in  ED Medications  sodium chloride 0.9 % bolus 500 mL (0 mLs Intravenous Stopped 03/15/18 1812)     Initial Impression / Assessment and Plan / ED Course  I have reviewed the triage vital signs and the nursing notes.  Pertinent labs & imaging results that were available during my care of the patient were reviewed by me and considered in my medical decision making (see chart for details).     Patient with hypotension.  Improved here.  Labs overall reassuring.  Creatinine near baseline but  BUN is mildly elevated.  Feels better.  Has been on some different medications recently which could be some of the cause.  Will discharge home and follow with PCP peer  Final Clinical Impressions(s) / ED Diagnoses   Final diagnoses:  Hypotension, unspecified hypotension type  Dehydration    ED Discharge Orders    None      Davonna Belling, MD 03/15/18 2359

## 2018-03-15 NOTE — Telephone Encounter (Signed)
Call received from [facility] at [facility] - relays that they need orders for home care for patient's back. Please advise. Phone#[phone]

## 2018-03-15 NOTE — Telephone Encounter (Signed)
Called with verbal from Dr Luna Glasgow he ordered back and neck physical therapy. Therapist states the frequency of home visits is 2x weekly for 2 more weeks, at that point she will follow back up with Dr Luna Glasgow and he will extend orders if needed.

## 2018-03-15 NOTE — ED Notes (Signed)
Patient was on cardiac monitor.  Due to numerous bathroom trips (ambulatory without assistance) impossible to keep patient on monitor

## 2018-03-16 DIAGNOSIS — S50812D Abrasion of left forearm, subsequent encounter: Secondary | ICD-10-CM | POA: Diagnosis not present

## 2018-03-16 DIAGNOSIS — S92414D Nondisplaced fracture of proximal phalanx of right great toe, subsequent encounter for fracture with routine healing: Secondary | ICD-10-CM | POA: Diagnosis not present

## 2018-03-16 DIAGNOSIS — S92511D Displaced fracture of proximal phalanx of right lesser toe(s), subsequent encounter for fracture with routine healing: Secondary | ICD-10-CM | POA: Diagnosis not present

## 2018-03-16 DIAGNOSIS — M545 Low back pain: Secondary | ICD-10-CM | POA: Diagnosis not present

## 2018-03-16 DIAGNOSIS — S80812D Abrasion, left lower leg, subsequent encounter: Secondary | ICD-10-CM | POA: Diagnosis not present

## 2018-03-16 DIAGNOSIS — G8911 Acute pain due to trauma: Secondary | ICD-10-CM | POA: Diagnosis not present

## 2018-03-20 DIAGNOSIS — M545 Low back pain: Secondary | ICD-10-CM | POA: Diagnosis not present

## 2018-03-20 DIAGNOSIS — Z0001 Encounter for general adult medical examination with abnormal findings: Secondary | ICD-10-CM | POA: Diagnosis not present

## 2018-03-20 DIAGNOSIS — S92414D Nondisplaced fracture of proximal phalanx of right great toe, subsequent encounter for fracture with routine healing: Secondary | ICD-10-CM | POA: Diagnosis not present

## 2018-03-20 DIAGNOSIS — E559 Vitamin D deficiency, unspecified: Secondary | ICD-10-CM | POA: Diagnosis not present

## 2018-03-20 DIAGNOSIS — S92511D Displaced fracture of proximal phalanx of right lesser toe(s), subsequent encounter for fracture with routine healing: Secondary | ICD-10-CM | POA: Diagnosis not present

## 2018-03-20 DIAGNOSIS — I959 Hypotension, unspecified: Secondary | ICD-10-CM | POA: Diagnosis not present

## 2018-03-20 DIAGNOSIS — S50812D Abrasion of left forearm, subsequent encounter: Secondary | ICD-10-CM | POA: Diagnosis not present

## 2018-03-20 DIAGNOSIS — I119 Hypertensive heart disease without heart failure: Secondary | ICD-10-CM | POA: Diagnosis not present

## 2018-03-20 DIAGNOSIS — E860 Dehydration: Secondary | ICD-10-CM | POA: Diagnosis not present

## 2018-03-20 DIAGNOSIS — D649 Anemia, unspecified: Secondary | ICD-10-CM | POA: Diagnosis not present

## 2018-03-20 DIAGNOSIS — G8911 Acute pain due to trauma: Secondary | ICD-10-CM | POA: Diagnosis not present

## 2018-03-20 DIAGNOSIS — S80812D Abrasion, left lower leg, subsequent encounter: Secondary | ICD-10-CM | POA: Diagnosis not present

## 2018-03-20 DIAGNOSIS — A088 Other specified intestinal infections: Secondary | ICD-10-CM | POA: Diagnosis not present

## 2018-03-21 DIAGNOSIS — G8911 Acute pain due to trauma: Secondary | ICD-10-CM | POA: Diagnosis not present

## 2018-03-21 DIAGNOSIS — S50812D Abrasion of left forearm, subsequent encounter: Secondary | ICD-10-CM | POA: Diagnosis not present

## 2018-03-21 DIAGNOSIS — S92511D Displaced fracture of proximal phalanx of right lesser toe(s), subsequent encounter for fracture with routine healing: Secondary | ICD-10-CM | POA: Diagnosis not present

## 2018-03-21 DIAGNOSIS — S92414D Nondisplaced fracture of proximal phalanx of right great toe, subsequent encounter for fracture with routine healing: Secondary | ICD-10-CM | POA: Diagnosis not present

## 2018-03-21 DIAGNOSIS — S80812D Abrasion, left lower leg, subsequent encounter: Secondary | ICD-10-CM | POA: Diagnosis not present

## 2018-03-21 DIAGNOSIS — M545 Low back pain: Secondary | ICD-10-CM | POA: Diagnosis not present

## 2018-03-22 ENCOUNTER — Telehealth: Payer: Self-pay | Admitting: Radiology

## 2018-03-22 ENCOUNTER — Telehealth: Payer: Self-pay | Admitting: Orthopaedic Surgery

## 2018-03-22 DIAGNOSIS — S50812D Abrasion of left forearm, subsequent encounter: Secondary | ICD-10-CM | POA: Diagnosis not present

## 2018-03-22 DIAGNOSIS — S80812D Abrasion, left lower leg, subsequent encounter: Secondary | ICD-10-CM | POA: Diagnosis not present

## 2018-03-22 DIAGNOSIS — S92511D Displaced fracture of proximal phalanx of right lesser toe(s), subsequent encounter for fracture with routine healing: Secondary | ICD-10-CM | POA: Diagnosis not present

## 2018-03-22 DIAGNOSIS — S92414D Nondisplaced fracture of proximal phalanx of right great toe, subsequent encounter for fracture with routine healing: Secondary | ICD-10-CM | POA: Diagnosis not present

## 2018-03-22 DIAGNOSIS — G8911 Acute pain due to trauma: Secondary | ICD-10-CM | POA: Diagnosis not present

## 2018-03-22 DIAGNOSIS — M545 Low back pain: Secondary | ICD-10-CM | POA: Diagnosis not present

## 2018-03-22 NOTE — Telephone Encounter (Signed)
[facility] has evaluated for neck and back, they are limited with what they can do at home but have worked with heat and ROM and stretching.

## 2018-03-22 NOTE — Telephone Encounter (Signed)
Hydrocodone-Acetaminophen  5/325 mg  Qty 30 Tablets ° °PATIENT USES [facility] ON [address] °

## 2018-03-22 NOTE — Telephone Encounter (Signed)
Methocarbamol (ROBAXIN) 750 MG  Qty 30 Tablets  PATIENT USES [facility] ON [address]

## 2018-03-23 DIAGNOSIS — S50812D Abrasion of left forearm, subsequent encounter: Secondary | ICD-10-CM | POA: Diagnosis not present

## 2018-03-23 DIAGNOSIS — S92414D Nondisplaced fracture of proximal phalanx of right great toe, subsequent encounter for fracture with routine healing: Secondary | ICD-10-CM | POA: Diagnosis not present

## 2018-03-23 DIAGNOSIS — G8911 Acute pain due to trauma: Secondary | ICD-10-CM | POA: Diagnosis not present

## 2018-03-23 DIAGNOSIS — M545 Low back pain: Secondary | ICD-10-CM | POA: Diagnosis not present

## 2018-03-23 DIAGNOSIS — S80812D Abrasion, left lower leg, subsequent encounter: Secondary | ICD-10-CM | POA: Diagnosis not present

## 2018-03-23 DIAGNOSIS — S92511D Displaced fracture of proximal phalanx of right lesser toe(s), subsequent encounter for fracture with routine healing: Secondary | ICD-10-CM | POA: Diagnosis not present

## 2018-03-26 DIAGNOSIS — S80812D Abrasion, left lower leg, subsequent encounter: Secondary | ICD-10-CM | POA: Diagnosis not present

## 2018-03-26 DIAGNOSIS — S92414D Nondisplaced fracture of proximal phalanx of right great toe, subsequent encounter for fracture with routine healing: Secondary | ICD-10-CM | POA: Diagnosis not present

## 2018-03-26 DIAGNOSIS — S92511D Displaced fracture of proximal phalanx of right lesser toe(s), subsequent encounter for fracture with routine healing: Secondary | ICD-10-CM | POA: Diagnosis not present

## 2018-03-26 DIAGNOSIS — S50812D Abrasion of left forearm, subsequent encounter: Secondary | ICD-10-CM | POA: Diagnosis not present

## 2018-03-26 DIAGNOSIS — M545 Low back pain: Secondary | ICD-10-CM | POA: Diagnosis not present

## 2018-03-26 DIAGNOSIS — G8911 Acute pain due to trauma: Secondary | ICD-10-CM | POA: Diagnosis not present

## 2018-03-26 MED ORDER — METHOCARBAMOL 750 MG PO TABS: 750.0000 mg | ORAL_TABLET | Freq: Two times a day (BID) | ORAL | 1 refills | Status: DC | PRN

## 2018-03-26 MED ORDER — HYDROCODONE-ACETAMINOPHEN 5-325 MG PO TABS: ORAL_TABLET | ORAL | 0 refills | Status: DC

## 2018-03-27 ENCOUNTER — Encounter: Payer: Self-pay | Admitting: Orthopaedic Surgery

## 2018-03-27 ENCOUNTER — Ambulatory Visit (INDEPENDENT_AMBULATORY_CARE_PROVIDER_SITE_OTHER): Payer: Medicare Other

## 2018-03-27 ENCOUNTER — Ambulatory Visit (INDEPENDENT_AMBULATORY_CARE_PROVIDER_SITE_OTHER): Payer: Medicare Other | Admitting: Orthopaedic Surgery

## 2018-03-27 VITALS — BP 122/71 | HR 80 | Ht 61.0 in | Wt 217.0 lb

## 2018-03-27 DIAGNOSIS — S92414D Nondisplaced fracture of proximal phalanx of right great toe, subsequent encounter for fracture with routine healing: Secondary | ICD-10-CM

## 2018-03-27 DIAGNOSIS — Z6841 Body Mass Index (BMI) 40.0 and over, adult: Secondary | ICD-10-CM | POA: Diagnosis not present

## 2018-03-27 DIAGNOSIS — M25571 Pain in right ankle and joints of right foot: Secondary | ICD-10-CM

## 2018-03-27 NOTE — Progress Notes (Signed)
Patient Annette Saunders, female DOB:[birth year], 72 y.o. UJW:[id number]  Chief Complaint  Patient presents with  . Toe Pain    Right great toe fracture    HPI  Annette Saunders is a 72 y.o. female who has a fracture of the great toe on the right. She has been having more pain of the right foot dorsally.  She has no new trauma.  She is concerned she could have a fracture in the foot.  She has no redness, no numbness.   Body mass index is 41 kg/m.  ROS  Review of Systems  Constitutional:       Patient does not have diabetes Patient has hypertension Patient does not have COPD Patient does not smoke.  HENT: Negative for congestion.   Respiratory: Negative for cough and shortness of breath.   Cardiovascular: Negative for chest pain.  Musculoskeletal: Positive for back pain, gait problem and myalgias.  Allergic/Immunologic: Negative for environmental allergies.  Psychiatric/Behavioral: The patient is nervous/anxious.   All other systems reviewed and are negative.   All other systems reviewed and are negative.  The patient meets the AMA guidelines for Morbid (severe) obesity with a BMI > 40.0 and I have recommended weight loss.  Past Medical History:  Diagnosis Date  . Anxiety   . Generalized osteoarthritis   . GERD (gastroesophageal reflux disease)   . Hyperlipidemia   . Hypertension   . Hypothyroidism   . Insomnia     Past Surgical History:  Procedure Laterality Date  . COLONOSCOPY  10/2014   Dr. Cristine Polio, outside hospital. Colonoscopy performed with Propofol, no polyps, small to medium size internal hemorrhoids noted.   [name] ECTOPIC PREGNANCY SURGERY    . ELBOW FRACTURE SURGERY    . HIATAL HERNIA REPAIR    . JOINT REPLACEMENT     bilateral knee  . TONSILLECTOMY      Family History  Problem Relation Age of Onset  . Colon cancer Neg Hx     Social History Social History   Tobacco Use  . Smoking status: Never Smoker  . Smokeless tobacco: Never Used  Substance  Use Topics  . Alcohol use: No    Alcohol/week: 0.0 oz  . Drug use: No    No Known Allergies  Current Outpatient Medications  Medication Sig Dispense Refill  . ARMOUR THYROID 60 MG tablet Take 60 mg by mouth daily.   2  . bismuth subsalicylate (KAOPECTATE) 262 MG/15ML suspension Take 30 mLs by mouth every 6 (six) hours as needed for indigestion or diarrhea or loose stools.    . cholecalciferol (VITAMIN D) 1000 units tablet Take 5,000 Units by mouth daily.     . ciprofloxacin (CIPRO) 250 MG tablet Take 250 mg by mouth 2 (two) times daily. 5 day course starting on 03/12/2018    . escitalopram (LEXAPRO) 10 MG tablet Take 10 mg by mouth every evening.     [name] esomeprazole (NEXIUM) 40 MG capsule Take 1 capsule (40 mg total) by mouth 2 (two) times daily before a meal. 60 capsule 3  . estradiol (ESTRACE) 0.5 MG tablet TAKE ONE TABLET BY MOUTH ONCE DAILY IN THE MORNING  2  . estradiol (ESTRACE) 1 MG tablet TK 1 T PO D  3  . gabapentin (NEURONTIN) 400 MG capsule Take 1 capsule by mouth 3 (three) times daily.   4  . HYDROcodone-acetaminophen (NORCO/VICODIN) 5-325 MG tablet One tablet every four hours as needed for acute pain.  Limit of five days per [place] statue.  30 tablet 0  . methocarbamol (ROBAXIN) 750 MG tablet Take 1 tablet (750 mg total) by mouth 2 (two) times daily as needed for muscle spasms. 30 tablet 1  . NP THYROID 90 MG tablet TK 1 T PO QD  3  . progesterone (PROMETRIUM) 200 MG capsule TK 2 CS PO QHS  3   No current facility-administered medications for this visit.      Physical Exam  Blood pressure 122/71, pulse 80, height 5\' 1"  (1.549 m), weight 217 lb (98.4 kg).  Constitutional: overall normal hygiene, normal nutrition, well developed, normal grooming, normal body habitus. Assistive device:CAM walker right  Musculoskeletal: gait and station Limp right, muscle tone and strength are normal, no tremors or atrophy is present.  .  Neurological: coordination overall normal.  Deep  tendon reflex/nerve stretch intact.  Sensation normal.  Cranial nerves II-XII intact.   Skin:   Normal overall no scars, lesions, ulcers or rashes. No psoriasis.  Psychiatric: Alert and oriented x 3.  Recent memory intact, remote memory unclear.  Normal mood and affect. Well groomed.  Good eye contact.  Cardiovascular: overall no swelling, no varicosities, no edema bilaterally, normal temperatures of the legs and arms, no clubbing, cyanosis and good capillary refill.  Right foot has tenderness of the dorsum of the foot and slight swelling.  NV intact. ROM of the ankle is full.  Her great toe is slightly tender but has normal appearance.  Lymphatic: palpation is normal.  All other systems reviewed and are negative   The patient has been educated about the nature of the problem(s) and counseled on treatment options.  The patient appeared to understand what I have discussed and is in agreement with it.  Encounter Diagnoses  Name Primary?  . Closed nondisplaced fracture of proximal phalanx of right great toe with routine healing, subsequent encounter Yes  . Pain in joint involving right ankle and foot   . Body mass index 40.0-44.9, adult ([place])   . Morbid obesity ([place])     PLAN Call if any problems.  Precautions discussed.  Continue current medications.   Return to clinic 3 weeks   X-rays on return of the right great toe.  Electronically Signed Sanjuana Kava, MD 7/30/201910:33 AM

## 2018-03-28 ENCOUNTER — Telehealth: Payer: Self-pay | Admitting: Orthopaedic Surgery

## 2018-03-28 ENCOUNTER — Ambulatory Visit: Payer: Medicare Other | Admitting: Gastroenterology

## 2018-03-28 DIAGNOSIS — M25571 Pain in right ankle and joints of right foot: Secondary | ICD-10-CM

## 2018-03-28 DIAGNOSIS — M545 Low back pain: Secondary | ICD-10-CM | POA: Diagnosis not present

## 2018-03-28 DIAGNOSIS — S92414D Nondisplaced fracture of proximal phalanx of right great toe, subsequent encounter for fracture with routine healing: Secondary | ICD-10-CM | POA: Diagnosis not present

## 2018-03-28 DIAGNOSIS — G8929 Other chronic pain: Secondary | ICD-10-CM

## 2018-03-28 DIAGNOSIS — S92511D Displaced fracture of proximal phalanx of right lesser toe(s), subsequent encounter for fracture with routine healing: Secondary | ICD-10-CM | POA: Diagnosis not present

## 2018-03-28 DIAGNOSIS — M542 Cervicalgia: Secondary | ICD-10-CM

## 2018-03-28 DIAGNOSIS — S80812D Abrasion, left lower leg, subsequent encounter: Secondary | ICD-10-CM | POA: Diagnosis not present

## 2018-03-28 DIAGNOSIS — S50812D Abrasion of left forearm, subsequent encounter: Secondary | ICD-10-CM | POA: Diagnosis not present

## 2018-03-28 DIAGNOSIS — G8911 Acute pain due to trauma: Secondary | ICD-10-CM | POA: Diagnosis not present

## 2018-03-28 NOTE — Telephone Encounter (Signed)
Done

## 2018-03-28 NOTE — Telephone Encounter (Signed)
Doubt gout but you can get serum uric acid level   Set up PT.

## 2018-03-28 NOTE — Telephone Encounter (Signed)
Routing to nurse

## 2018-03-28 NOTE — Telephone Encounter (Signed)
Patient called following office visit yesterday, 03/27/18, for two reasons. (1) She is concerned that her right foot/toe may be gout. Please advise.  (2) She is requesting out-patient physical therapy at [facility]; states she is ready.  Please advise regarding therapy orders.

## 2018-03-29 NOTE — Telephone Encounter (Signed)
Pt notified of lab and PT orders.

## 2018-03-29 NOTE — Telephone Encounter (Signed)
Left message to call back for information from provider.

## 2018-03-29 NOTE — Telephone Encounter (Signed)
Orders placed.

## 2018-03-30 DIAGNOSIS — M25571 Pain in right ankle and joints of right foot: Secondary | ICD-10-CM | POA: Diagnosis not present

## 2018-03-30 LAB — URIC ACID: Uric Acid, Serum: 7 mg/dL (ref 2.5–7.0)

## 2018-04-04 ENCOUNTER — Ambulatory Visit (HOSPITAL_COMMUNITY): Payer: No Typology Code available for payment source | Attending: Orthopaedic Surgery | Admitting: Physical Therapy

## 2018-04-04 ENCOUNTER — Encounter (HOSPITAL_COMMUNITY): Payer: Self-pay | Admitting: Physical Therapy

## 2018-04-04 ENCOUNTER — Other Ambulatory Visit: Payer: Self-pay

## 2018-04-04 DIAGNOSIS — M545 Low back pain, unspecified: Secondary | ICD-10-CM

## 2018-04-04 DIAGNOSIS — M542 Cervicalgia: Secondary | ICD-10-CM | POA: Insufficient documentation

## 2018-04-04 DIAGNOSIS — R262 Difficulty in walking, not elsewhere classified: Secondary | ICD-10-CM | POA: Diagnosis not present

## 2018-04-04 NOTE — Patient Instructions (Addendum)
AROM: Lateral Neck Flexion    Slowly tilt head toward one shoulder, then the other. Hold each position ___5_ seconds. Repeat _10___ times per set. Do __1__ sets per session. Do __2__ sessions per day.  http://orth.exer.us/296   Copyright  VHI. All rights reserved.  AROM: Neck Rotation    Turn head slowly to look over one shoulder, then the other. Hold each position __5__ seconds. Repeat ___10_ times per set. Do ___1_ sets per session. Do __2__ sessions per day.  http://orth.exer.us/294   Copyright  VHI. All rights reserved.  Isometric Gluteals    Tighten buttock muscles. Repeat __10__ times per set. Do __1__ sets per session. Do _2___ sessions per day.  http://orth.exer.us/1126   Copyright  VHI. All rights reserved.  Isometric Abdominal    Lying on back with knees bent, tighten stomach by pressing elbows down. Hold __5__ seconds. Repeat __10__ times per set. Do __1__ sets per session. Do ___2_ sessions per day.  http://orth.exer.us/1086   Copyright  VHI. All rights reserved.  Knee-to-Chest Stretch: Unilateral    With hand behind right knee, pull knee in to chest until a comfortable stretch is felt in lower back and buttocks. Keep back relaxed. Hold ___20_ seconds. Repeat __30__ times per set. Do __1__ sets per session. Do __2__ sessions per day.  http://orth.exer.us/126   Copyright  VHI. All rights reserved.
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
## 2018-04-07 ENCOUNTER — Other Ambulatory Visit: Payer: Self-pay | Admitting: Gastroenterology

## 2018-04-09 ENCOUNTER — Telehealth (HOSPITAL_COMMUNITY): Payer: Self-pay | Admitting: Internal Medicine

## 2018-04-09 ENCOUNTER — Ambulatory Visit (HOSPITAL_COMMUNITY): Payer: No Typology Code available for payment source

## 2018-04-09 NOTE — Telephone Encounter (Signed)
04/09/18  pt called to cx to say she is sick with a stomach virus and will be back next week

## 2018-04-11 ENCOUNTER — Encounter (HOSPITAL_COMMUNITY): Payer: Self-pay

## 2018-04-13 ENCOUNTER — Telehealth (HOSPITAL_COMMUNITY): Payer: Self-pay | Admitting: Internal Medicine

## 2018-04-13 NOTE — Telephone Encounter (Signed)
04/13/18  pt called to cx said that she has been sick and hopes to be here on the Thursday appt.

## 2018-04-17 ENCOUNTER — Telehealth (HOSPITAL_COMMUNITY): Payer: Self-pay | Admitting: Physical Therapy

## 2018-04-17 ENCOUNTER — Encounter (HOSPITAL_COMMUNITY): Payer: Self-pay

## 2018-04-17 ENCOUNTER — Encounter: Payer: Self-pay | Admitting: Orthopaedic Surgery

## 2018-04-17 ENCOUNTER — Ambulatory Visit (INDEPENDENT_AMBULATORY_CARE_PROVIDER_SITE_OTHER): Payer: Medicare Other

## 2018-04-17 ENCOUNTER — Ambulatory Visit (INDEPENDENT_AMBULATORY_CARE_PROVIDER_SITE_OTHER): Payer: Medicare Other | Admitting: Orthopaedic Surgery

## 2018-04-17 VITALS — BP 139/82 | HR 83 | Ht 61.0 in | Wt 217.0 lb

## 2018-04-17 DIAGNOSIS — E559 Vitamin D deficiency, unspecified: Secondary | ICD-10-CM | POA: Diagnosis not present

## 2018-04-17 DIAGNOSIS — G8929 Other chronic pain: Secondary | ICD-10-CM

## 2018-04-17 DIAGNOSIS — M542 Cervicalgia: Secondary | ICD-10-CM

## 2018-04-17 DIAGNOSIS — Z6841 Body Mass Index (BMI) 40.0 and over, adult: Secondary | ICD-10-CM

## 2018-04-17 DIAGNOSIS — I10 Essential (primary) hypertension: Secondary | ICD-10-CM | POA: Diagnosis not present

## 2018-04-17 DIAGNOSIS — R5383 Other fatigue: Secondary | ICD-10-CM | POA: Diagnosis not present

## 2018-04-17 DIAGNOSIS — M545 Low back pain: Secondary | ICD-10-CM

## 2018-04-17 DIAGNOSIS — F419 Anxiety disorder, unspecified: Secondary | ICD-10-CM | POA: Diagnosis not present

## 2018-04-17 DIAGNOSIS — E039 Hypothyroidism, unspecified: Secondary | ICD-10-CM | POA: Diagnosis not present

## 2018-04-17 DIAGNOSIS — S92414D Nondisplaced fracture of proximal phalanx of right great toe, subsequent encounter for fracture with routine healing: Secondary | ICD-10-CM
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
## 2018-04-17 NOTE — Progress Notes (Signed)
Patient Annette Saunders:[id number] Marcon, female DOB:1946-07-16, 72 y.o. JHE:[id number]  Chief Complaint  Patient presents with  . Toe Injury    02/25/18  . Back Pain    HPI  Annette Saunders is a 72 y.o. female who has a healing fracture of the right great toe and neck pain.  Her toe is doing well. She is walking well.  The neck is more of a problem.  She has pain turning to the left.  She has no paresthesias.  She has no spasm.  She would like home health therapy.   Body mass index is 41 kg/m.   The patient meets the AMA guidelines for Morbid (severe) obesity with a BMI > 40.0 and I have recommended weight loss.  ROS  Review of Systems  All other systems reviewed and are negative.  The following is a summary of the past history medically, past history surgically, known current medicines, social history and family history.  This information is gathered electronically by the computer from prior information and documentation.  I review this each visit and have found including this information at this point in the chart is beneficial and informative.    Past Medical History:  Diagnosis Date  . Anxiety   . Generalized osteoarthritis   . GERD (gastroesophageal reflux disease)   . Hyperlipidemia   . Hypertension   . Hypothyroidism   . Insomnia     Past Surgical History:  Procedure Laterality Date  . COLONOSCOPY  10/2014   Dr. Cristine Polio, outside hospital. Colonoscopy performed with Propofol, no polyps, small to medium size internal hemorrhoids noted.   [name] ECTOPIC PREGNANCY SURGERY    . ELBOW FRACTURE SURGERY    . HIATAL HERNIA REPAIR    . JOINT REPLACEMENT     bilateral knee  . TONSILLECTOMY      Family History  Problem Relation Age of Onset  . Colon cancer Neg Hx     Social History Social History   Tobacco Use  . Smoking status: Never Smoker  . Smokeless tobacco: Never Used  Substance Use Topics  . Alcohol use: No    Alcohol/week: 0.0 standard drinks  . Drug use: No    No  Known Allergies  Current Outpatient Medications  Medication Sig Dispense Refill  . allopurinol (ZYLOPRIM) 300 MG tablet TK 1 T PO D  3  . ARMOUR THYROID 60 MG tablet Take 60 mg by mouth daily.   2  . bismuth subsalicylate (KAOPECTATE) 262 MG/15ML suspension Take 30 mLs by mouth every 6 (six) hours as needed for indigestion or diarrhea or loose stools.    . cholecalciferol (VITAMIN D) 1000 units tablet Take 5,000 Units by mouth daily.     . ciprofloxacin (CIPRO) 250 MG tablet Take 250 mg by mouth 2 (two) times daily. 5 day course starting on 03/12/2018    . escitalopram (LEXAPRO) 10 MG tablet Take 10 mg by mouth every evening.     [name] esomeprazole (NEXIUM) 40 MG capsule TAKE 1 CAPSULE(40 MG) BY MOUTH TWICE DAILY BEFORE A MEAL 60 capsule 3  . estradiol (ESTRACE) 0.5 MG tablet TAKE ONE TABLET BY MOUTH ONCE DAILY IN THE MORNING  2  . estradiol (ESTRACE) 1 MG tablet TK 1 T PO D  3  . gabapentin (NEURONTIN) 400 MG capsule Take 1 capsule by mouth 3 (three) times daily.   4  . HYDROcodone-acetaminophen (NORCO/VICODIN) 5-325 MG tablet One tablet every four hours as needed for acute pain.  Limit of five days per  [place] statue. 30 tablet 0  . methocarbamol (ROBAXIN) 750 MG tablet Take 1 tablet (750 mg total) by mouth 2 (two) times daily as needed for muscle spasms. 30 tablet 1  . NP THYROID 90 MG tablet TK 1 T PO QD  3  . progesterone (PROMETRIUM) 100 MG capsule TK 1 C PO QPM  2  . progesterone (PROMETRIUM) 200 MG capsule TK 2 CS PO QHS  3   No current facility-administered medications for this visit.      Physical Exam  Blood pressure 139/82, pulse 83, height 5\' 1"  (1.549 m), weight 217 lb (98.4 kg).  Constitutional: overall normal hygiene, normal nutrition, well developed, normal grooming, normal body habitus. Assistive device:none  Musculoskeletal: gait and station Limp none, muscle tone and strength are normal, no tremors or atrophy is present.  .  Neurological: coordination overall normal.   Deep tendon reflex/nerve stretch intact.  Sensation normal.  Cranial nerves II-XII intact.   Skin:   Normal overall no scars, lesions, ulcers or rashes. No psoriasis.  Psychiatric: Alert and oriented x 3.  Recent memory intact, remote memory unclear.  Normal mood and affect. Well groomed.  Good eye contact.  Cardiovascular: overall no swelling, no varicosities, no edema bilaterally, normal temperatures of the legs and arms, no clubbing, cyanosis and good capillary refill.  Lymphatic: palpation is normal.  Neck has tenderness turning to the left.  NV intact. Grips are normal. She has no spasm.  All other systems reviewed and are negative   The patient has been educated about the nature of the problem(s) and counseled on treatment options.  The patient appeared to understand what I have discussed and is in agreement with it.  Encounter Diagnoses  Name Primary?  . Closed nondisplaced fracture of proximal phalanx of right great toe with routine healing, subsequent encounter Yes  . Chronic midline low back pain without sciatica   . Neck pain   . Body mass index 40.0-44.9, adult ([place])   . Morbid obesity ([place])     PLAN Call if any problems.  Precautions discussed.  Continue current medications.   Return to clinic 1 month   Begin home health.  Electronically Signed Sanjuana Kava, MD 8/20/201910:10 AM

## 2018-04-19 ENCOUNTER — Encounter (HOSPITAL_COMMUNITY): Payer: Self-pay | Admitting: Physical Therapy

## 2018-04-24 ENCOUNTER — Encounter (HOSPITAL_COMMUNITY): Payer: Self-pay | Admitting: Physical Therapy

## 2018-04-26 ENCOUNTER — Encounter (HOSPITAL_COMMUNITY): Payer: Self-pay | Admitting: Physical Therapy

## 2018-05-01 ENCOUNTER — Ambulatory Visit (HOSPITAL_COMMUNITY): Payer: Medicare Other | Admitting: Physical Therapy

## 2018-05-01 ENCOUNTER — Encounter (HOSPITAL_COMMUNITY): Payer: Self-pay | Admitting: Physical Therapy

## 2018-05-01 ENCOUNTER — Telehealth (HOSPITAL_COMMUNITY): Payer: Self-pay | Admitting: Physical Therapy
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
## 2018-05-01 NOTE — Telephone Encounter (Signed)
Called pt re missed appointment.  No answer.  Left message on phone re: next appointment.  Rayetta Humphrey, [place] CLT [phone]
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
## 2018-05-02 ENCOUNTER — Telehealth: Payer: Self-pay | Admitting: Orthopaedic Surgery

## 2018-05-02 NOTE — Telephone Encounter (Signed)
Annette Saunders states that she has been waiting for someone from home health PT to come to her house and no one has come or called.    Would you check on the referral for this please and let the patient know what is going on?  Thanks

## 2018-05-03 ENCOUNTER — Encounter (HOSPITAL_COMMUNITY): Payer: Self-pay | Admitting: Physical Therapy

## 2018-05-03 DIAGNOSIS — R5383 Other fatigue: Secondary | ICD-10-CM | POA: Diagnosis not present

## 2018-05-03 DIAGNOSIS — F419 Anxiety disorder, unspecified: Secondary | ICD-10-CM | POA: Diagnosis not present

## 2018-05-03 DIAGNOSIS — E039 Hypothyroidism, unspecified: Secondary | ICD-10-CM | POA: Diagnosis not present

## 2018-05-08 ENCOUNTER — Encounter (HOSPITAL_COMMUNITY): Payer: Self-pay | Admitting: Physical Therapy

## 2018-05-09 NOTE — Telephone Encounter (Signed)
Sent message to Romualdo Bolk at [facility] to check on this for [place].

## 2018-05-10 ENCOUNTER — Encounter (HOSPITAL_COMMUNITY): Payer: Self-pay | Admitting: Physical Therapy

## 2018-05-11 DIAGNOSIS — M47816 Spondylosis without myelopathy or radiculopathy, lumbar region: Secondary | ICD-10-CM | POA: Diagnosis not present

## 2018-05-15 ENCOUNTER — Encounter (HOSPITAL_COMMUNITY): Payer: Self-pay | Admitting: Physical Therapy

## 2018-05-15 ENCOUNTER — Ambulatory Visit (INDEPENDENT_AMBULATORY_CARE_PROVIDER_SITE_OTHER): Payer: Medicare Other

## 2018-05-15 ENCOUNTER — Encounter: Payer: Self-pay | Admitting: Orthopaedic Surgery

## 2018-05-15 ENCOUNTER — Ambulatory Visit (INDEPENDENT_AMBULATORY_CARE_PROVIDER_SITE_OTHER): Payer: Medicare Other | Admitting: Orthopaedic Surgery

## 2018-05-15 VITALS — BP 134/80 | HR 80 | Ht 61.0 in | Wt 202.0 lb

## 2018-05-15 DIAGNOSIS — S92414D Nondisplaced fracture of proximal phalanx of right great toe, subsequent encounter for fracture with routine healing: Secondary | ICD-10-CM

## 2018-05-15 NOTE — Progress Notes (Signed)
Patient Annette Saunders, female DOB:05-24-1946, 72 y.o. DGU:[id number]  Chief Complaint  Patient presents with  . Toe Pain  . Neck Pain    HPI  Annette Saunders is a 72 y.o. female who has continued pain of the right great toe.  She has no new trauma.  She has pain more at night.  She uses Bio Freeze.   She also has right knee pain and will be seen at [facility] for this soon.   Body mass index is 38.17 kg/m.  ROS  Review of Systems  Constitutional:       Patient does not have diabetes Patient has hypertension Patient does not have COPD Patient does not smoke.  HENT: Negative for congestion.   Respiratory: Negative for cough and shortness of breath.   Cardiovascular: Negative for chest pain.  Musculoskeletal: Positive for back pain, gait problem and myalgias.  Allergic/Immunologic: Negative for environmental allergies.  Psychiatric/Behavioral: The patient is nervous/anxious.   All other systems reviewed and are negative.   All other systems reviewed and are negative.  The following is a summary of the past history medically, past history surgically, known current medicines, social history and family history.  This information is gathered electronically by the computer from prior information and documentation.  I review this each visit and have found including this information at this point in the chart is beneficial and informative.    Past Medical History:  Diagnosis Date  . Anxiety   . Generalized osteoarthritis   . GERD (gastroesophageal reflux disease)   . Hyperlipidemia   . Hypertension   . Hypothyroidism   . Insomnia     Past Surgical History:  Procedure Laterality Date  . COLONOSCOPY  10/2014   Dr. Cristine Polio, outside hospital. Colonoscopy performed with Propofol, no polyps, small to medium size internal hemorrhoids noted.   [name] ECTOPIC PREGNANCY SURGERY    . ELBOW FRACTURE SURGERY    . HIATAL HERNIA REPAIR    . JOINT REPLACEMENT     bilateral knee   . TONSILLECTOMY      Family History  Problem Relation Age of Onset  . Colon cancer Neg Hx     Social History Social History   Tobacco Use  . Smoking status: Never Smoker  . Smokeless tobacco: Never Used  Substance Use Topics  . Alcohol use: No    Alcohol/week: 0.0 standard drinks  . Drug use: No    No Known Allergies  Current Outpatient Medications  Medication Sig Dispense Refill  . allopurinol (ZYLOPRIM) 300 MG tablet TK 1 T PO D  3  . ARMOUR THYROID 60 MG tablet Take 60 mg by mouth daily.   2  . bismuth subsalicylate (KAOPECTATE) 262 MG/15ML suspension Take 30 mLs by mouth every 6 (six) hours as needed for indigestion or diarrhea or loose stools.    . cholecalciferol (VITAMIN D) 1000 units tablet Take 5,000 Units by mouth daily.     . ciprofloxacin (CIPRO) 250 MG tablet Take 250 mg by mouth 2 (two) times daily. 5 day course starting on 03/12/2018    . escitalopram (LEXAPRO) 10 MG tablet Take 10 mg by mouth every evening.     [name] esomeprazole (NEXIUM) 40 MG capsule TAKE 1 CAPSULE(40 MG) BY MOUTH TWICE DAILY BEFORE A MEAL 60 capsule 3  . estradiol (ESTRACE) 0.5 MG tablet TAKE ONE TABLET BY MOUTH ONCE DAILY IN THE MORNING  2  . estradiol (ESTRACE) 1 MG tablet TK 1 T PO D  3  .  gabapentin (NEURONTIN) 400 MG capsule Take 1 capsule by mouth 3 (three) times daily.   4  . HYDROcodone-acetaminophen (NORCO/VICODIN) 5-325 MG tablet One tablet every four hours as needed for acute pain.  Limit of five days per [place] statue. 30 tablet 0  . methocarbamol (ROBAXIN) 750 MG tablet Take 1 tablet (750 mg total) by mouth 2 (two) times daily as needed for muscle spasms. 30 tablet 1  . NP THYROID 90 MG tablet TK 1 T PO QD  3  . progesterone (PROMETRIUM) 100 MG capsule TK 1 C PO QPM  2  . progesterone (PROMETRIUM) 200 MG capsule TK 2 CS PO QHS  3   No current facility-administered medications for this visit.      Physical Exam  Blood pressure 134/80, pulse 80, height 5\' 1"  (1.549 m), weight  202 lb (91.6 kg).  Constitutional: overall normal hygiene, normal nutrition, well developed, normal grooming, normal body habitus. Assistive device:none  Musculoskeletal: gait and station Limp right, muscle tone and strength are normal, no tremors or atrophy is present.  .  Neurological: coordination overall normal.  Deep tendon reflex/nerve stretch intact.  Sensation normal.  Cranial nerves II-XII intact.   Skin:   Normal overall no scars, lesions, ulcers or rashes. No psoriasis.  Psychiatric: Alert and oriented x 3.  Recent memory intact, remote memory unclear.  Normal mood and affect. Well groomed.  Good eye contact.  Cardiovascular: overall no swelling, no varicosities, no edema bilaterally, normal temperatures of the legs and arms, no clubbing, cyanosis and good capillary refill.  Lymphatic: palpation is normal.  Right  Great toe has no redness, no swelling, and good ROM.  NV intact.  Limp to the right.  All other systems reviewed and are negative   The patient has been educated about the nature of the problem(s) and counseled on treatment options.  The patient appeared to understand what I have discussed and is in agreement with it.  Encounter Diagnosis  Name Primary?  . Closed nondisplaced fracture of proximal phalanx of right great toe with routine healing, subsequent encounter Yes    X=-rays were done of the right great toe, reported separately.  PLAN Call if any problems.  Precautions discussed.  Continue current medications.   Return to clinic 1 month   Electronically Signed Sanjuana Kava, MD 9/17/201910:21 AM

## 2018-05-16 DIAGNOSIS — T7840XS Allergy, unspecified, sequela: Secondary | ICD-10-CM | POA: Diagnosis not present

## 2018-05-17 ENCOUNTER — Encounter (HOSPITAL_COMMUNITY): Payer: Self-pay | Admitting: Physical Therapy

## 2018-05-21 DIAGNOSIS — M25561 Pain in right knee: Secondary | ICD-10-CM | POA: Diagnosis not present

## 2018-05-21 DIAGNOSIS — Z96651 Presence of right artificial knee joint: Secondary | ICD-10-CM | POA: Insufficient documentation

## 2018-05-21 DIAGNOSIS — T8484XA Pain due to internal orthopedic prosthetic devices, implants and grafts, initial encounter: Secondary | ICD-10-CM | POA: Diagnosis not present

## 2018-05-23 DIAGNOSIS — R7982 Elevated C-reactive protein (CRP): Secondary | ICD-10-CM | POA: Diagnosis not present

## 2018-05-23 DIAGNOSIS — Z96651 Presence of right artificial knee joint: Secondary | ICD-10-CM | POA: Diagnosis not present

## 2018-05-23 DIAGNOSIS — T8484XA Pain due to internal orthopedic prosthetic devices, implants and grafts, initial encounter: Secondary | ICD-10-CM | POA: Diagnosis not present

## 2018-05-23 DIAGNOSIS — R700 Elevated erythrocyte sedimentation rate: Secondary | ICD-10-CM | POA: Diagnosis not present

## 2018-05-29 ENCOUNTER — Telehealth: Payer: Self-pay | Admitting: Orthopaedic Surgery

## 2018-05-29 NOTE — Telephone Encounter (Signed)
Call received via voice message per Marzetta Board, physical therapist for [facility], called to relay that patient declined home therapy. If questions, may call at [phone]

## 2018-05-30 ENCOUNTER — Other Ambulatory Visit (HOSPITAL_COMMUNITY): Payer: Self-pay | Admitting: Physician Assistant

## 2018-05-30 DIAGNOSIS — M25561 Pain in right knee: Secondary | ICD-10-CM

## 2018-05-30 DIAGNOSIS — Z96651 Presence of right artificial knee joint: Secondary | ICD-10-CM

## 2018-06-11 DIAGNOSIS — E2839 Other primary ovarian failure: Secondary | ICD-10-CM | POA: Diagnosis not present

## 2018-06-11 DIAGNOSIS — E039 Hypothyroidism, unspecified: Secondary | ICD-10-CM | POA: Diagnosis not present

## 2018-06-11 DIAGNOSIS — Z23 Encounter for immunization: Secondary | ICD-10-CM | POA: Diagnosis not present

## 2018-06-11 DIAGNOSIS — M25561 Pain in right knee: Secondary | ICD-10-CM | POA: Diagnosis not present

## 2018-06-11 DIAGNOSIS — R5383 Other fatigue: Secondary | ICD-10-CM | POA: Diagnosis not present

## 2018-06-11 DIAGNOSIS — R799 Abnormal finding of blood chemistry, unspecified: Secondary | ICD-10-CM | POA: Diagnosis not present

## 2018-06-11 DIAGNOSIS — G4709 Other insomnia: Secondary | ICD-10-CM | POA: Diagnosis not present

## 2018-06-12 ENCOUNTER — Ambulatory Visit: Payer: Medicare Other | Admitting: Orthopaedic Surgery

## 2018-06-26 ENCOUNTER — Ambulatory Visit (INDEPENDENT_AMBULATORY_CARE_PROVIDER_SITE_OTHER): Payer: Medicare Other | Admitting: Orthopaedic Surgery

## 2018-06-26 ENCOUNTER — Encounter: Payer: Self-pay | Admitting: Orthopaedic Surgery

## 2018-06-26 VITALS — BP 140/74 | HR 82 | Ht 61.0 in | Wt 195.0 lb

## 2018-06-26 DIAGNOSIS — G8929 Other chronic pain: Secondary | ICD-10-CM

## 2018-06-26 DIAGNOSIS — M79674 Pain in right toe(s): Secondary | ICD-10-CM

## 2018-06-26 DIAGNOSIS — M25512 Pain in left shoulder: Secondary | ICD-10-CM | POA: Diagnosis not present

## 2018-06-26 NOTE — Progress Notes (Signed)
Patient Annette Saunders, female DOB:1945/09/25, 72 y.o. SWF:[id number]  Chief Complaint  Patient presents with  . Toe Pain    right great toe   . Shoulder Pain    left     HPI  Annette Saunders is a 72 y.o. female who has right great toe pain and left shoulder pain.  She has no new trauma. She has no redness of the foot.  She uses BioFreeze on the toe and it helps.  She has prior fracture here.  She has pain of the left shoulder with pain with overhead motion.  She has no swelling, no redness, no trauma, no paresthesias.   Body mass index is 36.84 kg/m.  ROS  Review of Systems  Constitutional:       Patient does not have diabetes Patient has hypertension Patient does not have COPD Patient does not smoke.  HENT: Negative for congestion.   Respiratory: Negative for cough and shortness of breath.   Cardiovascular: Negative for chest pain.  Musculoskeletal: Positive for back pain, gait problem and myalgias.  Allergic/Immunologic: Negative for environmental allergies.  Psychiatric/Behavioral: The patient is nervous/anxious.   All other systems reviewed and are negative.   All other systems reviewed and are negative.  The following is a summary of the past history medically, past history surgically, known current medicines, social history and family history.  This information is gathered electronically by the computer from prior information and documentation.  I review this each visit and have found including this information at this point in the chart is beneficial and informative.    Past Medical History:  Diagnosis Date  . Anxiety   . Generalized osteoarthritis   . GERD (gastroesophageal reflux disease)   . Hyperlipidemia   . Hypertension   . Hypothyroidism   . Insomnia     Past Surgical History:  Procedure Laterality Date  . COLONOSCOPY  10/2014   Dr. Cristine Polio, outside hospital. Colonoscopy performed with Propofol, no polyps, small to medium size internal hemorrhoids  noted.   [name] ECTOPIC PREGNANCY SURGERY    . ELBOW FRACTURE SURGERY    . HIATAL HERNIA REPAIR    . JOINT REPLACEMENT     bilateral knee  . TONSILLECTOMY      Family History  Problem Relation Age of Onset  . Colon cancer Neg Hx     Social History Social History   Tobacco Use  . Smoking status: Never Smoker  . Smokeless tobacco: Never Used  Substance Use Topics  . Alcohol use: No    Alcohol/week: 0.0 standard drinks  . Drug use: No    No Known Allergies  Current Outpatient Medications  Medication Sig Dispense Refill  . allopurinol (ZYLOPRIM) 300 MG tablet TK 1 T PO D  3  . ARMOUR THYROID 60 MG tablet Take 60 mg by mouth daily.   2  . bismuth subsalicylate (KAOPECTATE) 262 MG/15ML suspension Take 30 mLs by mouth every 6 (six) hours as needed for indigestion or diarrhea or loose stools.    . cholecalciferol (VITAMIN D) 1000 units tablet Take 5,000 Units by mouth daily.     . ciprofloxacin (CIPRO) 250 MG tablet Take 250 mg by mouth 2 (two) times daily. 5 day course starting on 03/12/2018    . escitalopram (LEXAPRO) 10 MG tablet Take 10 mg by mouth every evening.     [name] esomeprazole (NEXIUM) 40 MG capsule TAKE 1 CAPSULE(40 MG) BY MOUTH TWICE DAILY BEFORE A MEAL 60 capsule 3  . estradiol (ESTRACE)  0.5 MG tablet TAKE ONE TABLET BY MOUTH ONCE DAILY IN THE MORNING  2  . estradiol (ESTRACE) 1 MG tablet TK 1 T PO D  3  . gabapentin (NEURONTIN) 400 MG capsule Take 1 capsule by mouth 3 (three) times daily.   4  . HYDROcodone-acetaminophen (NORCO/VICODIN) 5-325 MG tablet One tablet every four hours as needed for acute pain.  Limit of five days per [place] statue. 30 tablet 0  . methocarbamol (ROBAXIN) 750 MG tablet Take 1 tablet (750 mg total) by mouth 2 (two) times daily as needed for muscle spasms. 30 tablet 1  . NP THYROID 90 MG tablet TK 1 T PO QD  3  . progesterone (PROMETRIUM) 100 MG capsule TK 1 C PO QPM  2  . progesterone (PROMETRIUM) 200 MG capsule TK 2 CS PO QHS  3   No current  facility-administered medications for this visit.      Physical Exam  Blood pressure 140/74, pulse 82, height 5\' 1"  (1.549 m), weight 195 lb (88.5 kg).  Constitutional: overall normal hygiene, normal nutrition, well developed, normal grooming, normal body habitus. Assistive device:none  Musculoskeletal: gait and station Limp none, muscle tone and strength are normal, no tremors or atrophy is present.  .  Neurological: coordination overall normal.  Deep tendon reflex/nerve stretch intact.  Sensation normal.  Cranial nerves II-XII intact.   Skin:   Normal overall no scars, lesions, ulcers or rashes. No psoriasis.  Psychiatric: Alert and oriented x 3.  Recent memory intact, remote memory unclear.  Normal mood and affect. Well groomed.  Good eye contact.  Cardiovascular: overall no swelling, no varicosities, no edema bilaterally, normal temperatures of the legs and arms, no clubbing, cyanosis and good capillary refill.  Lymphatic: palpation is normal.  Examination of left Upper Extremity is done.  Inspection:   Overall:  Elbow non-tender without crepitus or defects, forearm non-tender without crepitus or defects, wrist non-tender without crepitus or defects, hand non-tender.    Shoulder: with glenohumeral joint tenderness, without effusion.   Upper arm: without swelling and tenderness   Range of motion:   Overall:  Full range of motion of the elbow, full range of motion of wrist and full range of motion in fingers.   Shoulder:  left  165 degrees forward flexion; 150 degrees abduction; 35 degrees internal rotation, 35 degrees external rotation, 15 degrees extension, 40 degrees adduction.   Stability:   Overall:  Shoulder, elbow and wrist stable   Strength and Tone:   Overall full shoulder muscles strength, full upper arm strength and normal upper arm bulk and tone. All other systems reviewed and are negative   The patient has been educated about the nature of the problem(s) and  counseled on treatment options.  The patient appeared to understand what I have discussed and is in agreement with it.  Encounter Diagnoses  Name Primary?  . Chronic left shoulder pain Yes  . Pain of right great toe    PROCEDURE NOTE:  The patient request injection, verbal consent was obtained.  The left shoulder was prepped appropriately after time out was performed.   Sterile technique was observed and injection of 1 cc of Depo-Medrol 40 mg with several cc's of plain xylocaine. Anesthesia was provided by ethyl chloride and a 20-gauge needle was used to inject the shoulder area. A posterior approach was used.  The injection was tolerated well.  A band aid dressing was applied.  The patient was advised to apply ice later  today and tomorrow to the injection sight as needed.    PLAN Call if any problems.  Precautions discussed.  Continue current medications.   Return to clinic 1 month   Electronically Signed Sanjuana Kava, MD 10/29/201910:56 AM

## 2018-06-27 DIAGNOSIS — R0789 Other chest pain: Secondary | ICD-10-CM | POA: Diagnosis not present

## 2018-06-27 DIAGNOSIS — R5383 Other fatigue: Secondary | ICD-10-CM | POA: Diagnosis not present

## 2018-07-11 DIAGNOSIS — R0789 Other chest pain: Secondary | ICD-10-CM | POA: Diagnosis not present

## 2018-07-11 DIAGNOSIS — G4709 Other insomnia: Secondary | ICD-10-CM | POA: Diagnosis not present

## 2018-07-11 DIAGNOSIS — E669 Obesity, unspecified: Secondary | ICD-10-CM | POA: Diagnosis not present

## 2018-07-19 DIAGNOSIS — G4700 Insomnia, unspecified: Secondary | ICD-10-CM | POA: Diagnosis not present

## 2018-07-19 DIAGNOSIS — I10 Essential (primary) hypertension: Secondary | ICD-10-CM | POA: Diagnosis not present

## 2018-07-19 DIAGNOSIS — E669 Obesity, unspecified: Secondary | ICD-10-CM | POA: Diagnosis not present

## 2018-07-31 ENCOUNTER — Ambulatory Visit: Payer: Medicare Other | Admitting: Orthopaedic Surgery

## 2018-08-01 ENCOUNTER — Ambulatory Visit: Payer: Medicare Other | Admitting: Gastroenterology

## 2018-08-01 DIAGNOSIS — R51 Headache: Secondary | ICD-10-CM | POA: Diagnosis not present

## 2018-08-01 DIAGNOSIS — R5383 Other fatigue: Secondary | ICD-10-CM | POA: Diagnosis not present

## 2018-08-01 DIAGNOSIS — M13142 Monoarthritis, not elsewhere classified, left hand: Secondary | ICD-10-CM | POA: Diagnosis not present

## 2018-08-02 ENCOUNTER — Encounter: Payer: Self-pay | Admitting: Orthopaedic Surgery

## 2018-08-02 ENCOUNTER — Ambulatory Visit (INDEPENDENT_AMBULATORY_CARE_PROVIDER_SITE_OTHER): Payer: Medicare Other | Admitting: Orthopaedic Surgery

## 2018-08-02 VITALS — BP 131/80 | HR 75 | Ht 61.0 in | Wt 200.0 lb

## 2018-08-02 DIAGNOSIS — M25512 Pain in left shoulder: Secondary | ICD-10-CM | POA: Diagnosis not present

## 2018-08-02 DIAGNOSIS — M542 Cervicalgia: Secondary | ICD-10-CM | POA: Diagnosis not present

## 2018-08-02 DIAGNOSIS — G8929 Other chronic pain: Secondary | ICD-10-CM

## 2018-08-02 MED ORDER — HYDROCODONE-ACETAMINOPHEN 5-325 MG PO TABS: ORAL_TABLET | ORAL | 0 refills | Status: DC

## 2018-08-02 NOTE — Progress Notes (Signed)
Patient FX:[id number] Annette Saunders, female DOB:12-29-1945, 72 y.o. IYM:[id number]  Chief Complaint  Patient presents with  . Neck Pain  . Shoulder Pain    left    HPI  Annette Saunders is a 72 y.o. female who has having more neck pain and shoulder pain on the left with radiation down the arm.  She is worse. She is not sleeping well.  The injection into the shoulder last time did not help much.  I will get MRI of the cervical spine.  I will give her pain medicine as well.   Body mass index is 37.79 kg/m.  ROS  Review of Systems  Constitutional:       Patient does not have diabetes Patient has hypertension Patient does not have COPD Patient does not smoke.  HENT: Negative for congestion.   Respiratory: Negative for cough and shortness of breath.   Cardiovascular: Negative for chest pain.  Musculoskeletal: Positive for back pain, gait problem and myalgias.  Allergic/Immunologic: Negative for environmental allergies.  Psychiatric/Behavioral: The patient is nervous/anxious.   All other systems reviewed and are negative.   All other systems reviewed and are negative.  The following is a summary of the past history medically, past history surgically, known current medicines, social history and family history.  This information is gathered electronically by the computer from prior information and documentation.  I review this each visit and have found including this information at this point in the chart is beneficial and informative.    Past Medical History:  Diagnosis Date  . Anxiety   . Generalized osteoarthritis   . GERD (gastroesophageal reflux disease)   . Hyperlipidemia   . Hypertension   . Hypothyroidism   . Insomnia     Past Surgical History:  Procedure Laterality Date  . COLONOSCOPY  10/2014   Dr. Cristine Polio, outside hospital. Colonoscopy performed with Propofol, no polyps, small to medium size internal hemorrhoids noted.   [name] ECTOPIC PREGNANCY SURGERY    . ELBOW FRACTURE  SURGERY    . HIATAL HERNIA REPAIR    . JOINT REPLACEMENT     bilateral knee  . TONSILLECTOMY      Family History  Problem Relation Age of Onset  . Colon cancer Neg Hx     Social History Social History   Tobacco Use  . Smoking status: Never Smoker  . Smokeless tobacco: Never Used  Substance Use Topics  . Alcohol use: No    Alcohol/week: 0.0 standard drinks  . Drug use: No    No Known Allergies  Current Outpatient Medications  Medication Sig Dispense Refill  . allopurinol (ZYLOPRIM) 300 MG tablet TK 1 T PO D  3  . ARMOUR THYROID 60 MG tablet Take 60 mg by mouth daily.   2  . bismuth subsalicylate (KAOPECTATE) 262 MG/15ML suspension Take 30 mLs by mouth every 6 (six) hours as needed for indigestion or diarrhea or loose stools.    . cholecalciferol (VITAMIN D) 1000 units tablet Take 5,000 Units by mouth daily.     [name] escitalopram (LEXAPRO) 10 MG tablet Take 10 mg by mouth every evening.     [name] esomeprazole (NEXIUM) 40 MG capsule TAKE 1 CAPSULE(40 MG) BY MOUTH TWICE DAILY BEFORE A MEAL 60 capsule 3  . estradiol (ESTRACE) 1 MG tablet TK 1 T PO D  3  . gabapentin (NEURONTIN) 400 MG capsule Take 1 capsule by mouth 3 (three) times daily.   4  . HYDROcodone-acetaminophen (NORCO/VICODIN) 5-325 MG tablet One tablet every  four hours as needed for acute pain.  Limit of five days per  statue. 30 tablet 0  . methocarbamol (ROBAXIN) 750 MG tablet Take 1 tablet (750 mg total) by mouth 2 (two) times daily as needed for muscle spasms. 30 tablet 1  . progesterone (PROMETRIUM) 200 MG capsule TK 2 CS PO QHS  3  . estradiol (ESTRACE) 0.5 MG tablet TAKE ONE TABLET BY MOUTH ONCE DAILY IN THE MORNING  2  . NP THYROID 90 MG tablet TK 1 T PO QD  3  . progesterone (PROMETRIUM) 100 MG capsule TK 1 C PO QPM  2   No current facility-administered medications for this visit.      Physical Exam  Blood pressure 131/80, pulse 75, height 5\' 1"  (1.549 m), weight 200 lb (90.7 kg).  Constitutional:  overall normal hygiene, normal nutrition, well developed, normal grooming, normal body habitus. Assistive device:none  Musculoskeletal: gait and station Limp none, muscle tone and strength are normal, no tremors or atrophy is present.  .  Neurological: coordination overall normal.  Deep tendon reflex/nerve stretch intact.  Sensation normal.  Cranial nerves II-XII intact.   Skin:   Normal overall no scars, lesions, ulcers or rashes. No psoriasis.  Psychiatric: Alert and oriented x 3.  Recent memory intact, remote memory unclear.  Normal mood and affect. Well groomed.  Good eye contact.  Cardiovascular: overall no swelling, no varicosities, no edema bilaterally, normal temperatures of the legs and arms, no clubbing, cyanosis and good capillary refill.  Lymphatic: palpation is normal.  Neck has full ROM but is tender on the left side.  Upper left trapezius is tight.  ROM of the left shoulder is full but tender in the extremes.  Grips are normal.  All other systems reviewed and are negative   The patient has been educated about the nature of the problem(s) and counseled on treatment options.  The patient appeared to understand what I have discussed and is in agreement with it.  Encounter Diagnoses  Name Primary?  . Neck pain   . Chronic left shoulder pain Yes   I will get MRI of the cervical spine   I am concerned about possible HNP  I have given pain medicine.  I have reviewed the [place] web site prior to prescribing narcotic medicine for this patient.    PLAN Call if any problems.  Precautions discussed.  Continue current medications.   Return to clinic after MRI.   Electronically Signed Annette Kava, MD 12/5/201910:08 AM

## 2018-08-06 ENCOUNTER — Other Ambulatory Visit (HOSPITAL_COMMUNITY): Payer: Self-pay | Admitting: Nurse Practitioner

## 2018-08-06 DIAGNOSIS — R0609 Other forms of dyspnea: Secondary | ICD-10-CM

## 2018-08-07 ENCOUNTER — Ambulatory Visit (HOSPITAL_BASED_OUTPATIENT_CLINIC_OR_DEPARTMENT_OTHER)
Admission: RE | Admit: 2018-08-07 | Discharge: 2018-08-07 | Disposition: A | Discharge status: Home / Self Care | Payer: Medicare Other | Source: Ambulatory Visit | Attending: Internal Medicine | Admitting: Internal Medicine

## 2018-08-07 ENCOUNTER — Ambulatory Visit (HOSPITAL_COMMUNITY)
Admission: RE | Admit: 2018-08-07 | Discharge: 2018-08-07 | Disposition: A | Discharge status: Home / Self Care | Payer: Medicare Other | Source: Ambulatory Visit | Attending: Orthopaedic Surgery | Admitting: Orthopaedic Surgery

## 2018-08-07 DIAGNOSIS — R5383 Other fatigue: Secondary | ICD-10-CM | POA: Diagnosis not present

## 2018-08-07 DIAGNOSIS — I119 Hypertensive heart disease without heart failure: Secondary | ICD-10-CM | POA: Diagnosis not present

## 2018-08-07 DIAGNOSIS — M47812 Spondylosis without myelopathy or radiculopathy, cervical region: Secondary | ICD-10-CM | POA: Insufficient documentation

## 2018-08-07 DIAGNOSIS — R0609 Other forms of dyspnea: Secondary | ICD-10-CM

## 2018-08-07 DIAGNOSIS — K219 Gastro-esophageal reflux disease without esophagitis: Secondary | ICD-10-CM | POA: Diagnosis not present

## 2018-08-07 DIAGNOSIS — G8929 Other chronic pain: Secondary | ICD-10-CM

## 2018-08-07 DIAGNOSIS — R600 Localized edema: Secondary | ICD-10-CM | POA: Insufficient documentation

## 2018-08-07 DIAGNOSIS — M4802 Spinal stenosis, cervical region: Secondary | ICD-10-CM | POA: Diagnosis not present

## 2018-08-07 DIAGNOSIS — E785 Hyperlipidemia, unspecified: Secondary | ICD-10-CM | POA: Insufficient documentation

## 2018-08-07 DIAGNOSIS — M542 Cervicalgia: Secondary | ICD-10-CM | POA: Diagnosis not present

## 2018-08-07 DIAGNOSIS — F419 Anxiety disorder, unspecified: Secondary | ICD-10-CM | POA: Insufficient documentation

## 2018-08-07 DIAGNOSIS — M25512 Pain in left shoulder: Secondary | ICD-10-CM

## 2018-08-07 NOTE — Progress Notes (Signed)
*  PRELIMINARY RESULTS* Echocardiogram 2D Echocardiogram has been performed.  Annette Saunders 08/07/2018, 2:52 PM

## 2018-08-10 ENCOUNTER — Other Ambulatory Visit: Payer: Self-pay | Admitting: Nurse Practitioner

## 2018-08-10 NOTE — Progress Notes (Signed)
Cardiology Office Note  Date: 08/13/2018   ID: Annette Saunders, DOB 30-May-1946, MRN [id number]  PCP: Doree Albee, MD  Primary Cardiologist: Rozann Lesches, MD   Chief Complaint  Patient presents with  . Cardiac follow-up    History of Present Illness: Annette Saunders is a 72 y.o. female seen in consultation back in January for evaluation of chest pain.  Lexiscan Myoview was negative for ischemia.  She is referred back to the office by Dr. Anastasio Champion for follow-up with recent echocardiogram outlined below.  She has been doing well by report, states that she has lost nearly 30 pounds through diet.  She states that she has been compliant with her medications.  She reports NYHA class II dyspnea with typical activities, no exertional chest pain, palpitations, or syncope.  Echocardiogram done just recently on December 10 demonstrated mild LVH with LVEF 60 to 65%, no wall motion abnormalities, and mild diastolic dysfunction.  Also mentioned in the report that a PFO cannot be excluded, although RV chamber size and contraction was normal.  I reviewed the images myself.  Actually the patient has an atrial septal aneurysm, an incidental finding particularly in an asymptomatic patient.  She does not require aspirin for this.  There may be a very small degree of left to right shunting in this region which would not be unexpected as there is sometimes an associated small PFO.  Past Medical History:  Diagnosis Date  . Anxiety   . Generalized osteoarthritis   . GERD (gastroesophageal reflux disease)   . Hyperlipidemia   . Hypertension   . Hypothyroidism   . Insomnia     Past Surgical History:  Procedure Laterality Date  . COLONOSCOPY  10/2014   Dr. Cristine Polio, outside hospital. Colonoscopy performed with Propofol, no polyps, small to medium size internal hemorrhoids noted.   [name] ECTOPIC PREGNANCY SURGERY    . ELBOW FRACTURE SURGERY    . HIATAL HERNIA REPAIR    . JOINT REPLACEMENT     bilateral  knee  . TONSILLECTOMY      Current Outpatient Medications  Medication Sig Dispense Refill  . allopurinol (ZYLOPRIM) 300 MG tablet TK 1 T PO D  3  . ARMOUR THYROID 60 MG tablet Take 60 mg by mouth daily.   2  . bismuth subsalicylate (KAOPECTATE) 262 MG/15ML suspension Take 30 mLs by mouth every 6 (six) hours as needed for indigestion or diarrhea or loose stools.    . cholecalciferol (VITAMIN D) 1000 units tablet Take 5,000 Units by mouth daily.     [name] escitalopram (LEXAPRO) 10 MG tablet Take 10 mg by mouth every evening.     [name] esomeprazole (NEXIUM) 40 MG capsule TAKE 1 CAPSULE(40 MG) BY MOUTH TWICE DAILY BEFORE A MEAL 60 capsule 3  . estradiol (ESTRACE) 0.5 MG tablet TAKE ONE TABLET BY MOUTH ONCE DAILY IN THE MORNING  2  . estradiol (ESTRACE) 1 MG tablet TK 1 T PO D  3  . gabapentin (NEURONTIN) 400 MG capsule Take 1 capsule by mouth 3 (three) times daily.   4  . HYDROcodone-acetaminophen (NORCO/VICODIN) 5-325 MG tablet One tablet every four hours as needed for acute pain.  Limit of five days per [place] statue. 30 tablet 0  . methocarbamol (ROBAXIN) 750 MG tablet Take 1 tablet (750 mg total) by mouth 2 (two) times daily as needed for muscle spasms. 30 tablet 1  . NP THYROID 90 MG tablet TK 1 T PO QD  3  .  progesterone (PROMETRIUM) 100 MG capsule TK 1 C PO QPM  2  . progesterone (PROMETRIUM) 200 MG capsule TK 2 CS PO QHS  3   No current facility-administered medications for this visit.    Allergies:  Patient has no known allergies.   Social History: The patient  reports that she has never smoked. She has never used smokeless tobacco. She reports that she does not drink alcohol or use drugs.   ROS:  Please see the history of present illness. Otherwise, complete review of systems is positive for none.  All other systems are reviewed and negative.   Physical Exam: VS:  BP 132/74   Pulse 83   Ht 5' (1.524 m)   Wt 202 lb (91.6 kg)   SpO2 99%   BMI 39.45 kg/m , BMI Body mass index is  39.45 kg/m.  Wt Readings from Last 3 Encounters:  08/13/18 202 lb (91.6 kg)  08/02/18 200 lb (90.7 kg)  06/26/18 195 lb (88.5 kg)    General: Overweight woman, appears comfortable at rest. HEENT: Conjunctiva and lids normal, oropharynx clear. Neck: Supple, no elevated JVP or carotid bruits, no thyromegaly. Lungs: Clear to auscultation, nonlabored breathing at rest. Cardiac: Regular rate and rhythm, no S3, soft systolic murmur. Abdomen: Soft, nontender, bowel sounds present. Extremities: No pitting edema, distal pulses 2+. Skin: Warm and dry. Musculoskeletal: No kyphosis. Neuropsychiatric: Alert and oriented x3, affect grossly appropriate.  ECG: I personally reviewed the tracing from 6/30/319 which showed sinus rhythm with borderline low voltage.  Recent Labwork: 02/25/2018: ALT 18; AST 25 03/15/2018: BUN 29; Creatinine, Ser 1.15; Hemoglobin 11.1; Platelets 442; Potassium 4.1; Sodium 136   Other Studies Reviewed Today:  Echocardiogram 08/07/2018: Study Conclusions  - Left ventricle: The cavity size was normal. Wall thickness was   increased in a pattern of mild LVH. Systolic function was normal.   The estimated ejection fraction was in the range of 60% to 65%.   Wall motion was normal; there were no regional wall motion   abnormalities. Doppler parameters are consistent with abnormal   left ventricular relaxation (grade 1 diastolic dysfunction). - Aortic valve: Calcified annulus. Trileaflet; mildly thickened   leaflets.  Assessment and Plan:  1.  Mild diastolic dysfunction by recent echocardiogram.  LVEF normal at 60 to 65% with mild LVH.  This is a very common finding particularly in the setting of hypertension and does not require specific intervention at this time.  She is working on weight loss and is not on a specific antihypertensive at this time.  I have recommended that she follow-up with PCP for review of blood pressure over time, she may ultimately be a candidate for  ARB or chlorthalidone if needed.  2.  Incidentally noted atrial septal aneurysm, possibly small PFO although no significant degree of shunting with normal RV function.  Patient is asymptomatic and does not require any specific treatment for this.  We discussed this today.  3.  Hypothyroidism, on Armour Thyroid.  4.  Obesity, patient continues to work on weight loss.  Current medicines were reviewed with the patient today.  Disposition: Follow-up with Dr. Anastasio Champion.  Signed, Satira Sark, MD, [facility] 08/13/2018 10:59 AM    [facility] [address], [place], [place] [address] Phone: [phone]; Fax: [phone]

## 2018-08-13 ENCOUNTER — Encounter: Payer: Self-pay | Admitting: Cardiology

## 2018-08-13 ENCOUNTER — Ambulatory Visit (INDEPENDENT_AMBULATORY_CARE_PROVIDER_SITE_OTHER): Payer: Medicare Other | Admitting: Cardiology

## 2018-08-13 VITALS — BP 132/74 | HR 83 | Ht 60.0 in | Wt 202.0 lb

## 2018-08-13 DIAGNOSIS — R931 Abnormal findings on diagnostic imaging of heart and coronary circulation: Secondary | ICD-10-CM

## 2018-08-13 DIAGNOSIS — E039 Hypothyroidism, unspecified: Secondary | ICD-10-CM

## 2018-08-13 DIAGNOSIS — I253 Aneurysm of heart: Secondary | ICD-10-CM

## 2018-08-13 NOTE — Patient Instructions (Addendum)
Medication Instructions:   Your physician recommends that you continue on your current medications as directed. Please refer to the Current Medication list given to you today.  Labwork:  NONE  Testing/Procedures:  NONE  Follow-Up:  Your physician recommends that you schedule a follow-up appointment in: as needed.   Any Other Special Instructions Will Be Listed Below (If Applicable).  If you need a refill on your cardiac medications before your next appointment, please call your pharmacy. 

## 2018-08-14 ENCOUNTER — Ambulatory Visit: Payer: Medicare Other | Admitting: Orthopaedic Surgery

## 2018-08-15 DIAGNOSIS — R5383 Other fatigue: Secondary | ICD-10-CM | POA: Diagnosis not present

## 2018-08-16 ENCOUNTER — Ambulatory Visit (INDEPENDENT_AMBULATORY_CARE_PROVIDER_SITE_OTHER): Payer: Medicare Other | Admitting: Orthopaedic Surgery

## 2018-08-16 ENCOUNTER — Encounter: Payer: Self-pay | Admitting: Orthopaedic Surgery

## 2018-08-16 VITALS — BP 165/77 | HR 78 | Ht 61.0 in | Wt 202.0 lb

## 2018-08-16 DIAGNOSIS — M542 Cervicalgia: Secondary | ICD-10-CM | POA: Diagnosis not present

## 2018-08-16 DIAGNOSIS — M5030 Other cervical disc degeneration, unspecified cervical region: Secondary | ICD-10-CM

## 2018-08-16 DIAGNOSIS — M4802 Spinal stenosis, cervical region: Secondary | ICD-10-CM

## 2018-08-16 MED ORDER — HYDROCODONE-ACETAMINOPHEN 5-325 MG PO TABS: ORAL_TABLET | ORAL | 0 refills | Status: DC

## 2018-08-16 NOTE — Progress Notes (Signed)
Patient Annette Saunders, female DOB:10-22-1945, 72 y.o. AOZ:[id number]  Chief Complaint  Patient presents with  . Neck Pain  . Results    review Cervical MRI     HPI  Annette Saunders is a 72 y.o. female who has neck pain and left sided paresthesias.  She had MRI done of the neck and it showed: IMPRESSION: 1. Multilevel anterolisthesis without acute osseous process. 2. Degenerative change of the cervical spine. Mild canal stenosis C5-6. 3. Neural foraminal narrowing all cervical levels: Severe at C5-6 and C6-7.  I have explained the findings to her of the marked degenerative changes in the neck.  I will have neurosurgeon see her and see if she is candidate for epidural injection in this area.   Body mass index is 38.17 kg/m.  ROS  Review of Systems  Constitutional:       Patient does not have diabetes Patient has hypertension Patient does not have COPD Patient does not smoke.  HENT: Negative for congestion.   Respiratory: Negative for cough and shortness of breath.   Cardiovascular: Negative for chest pain.  Musculoskeletal: Positive for back pain, gait problem and myalgias.  Allergic/Immunologic: Negative for environmental allergies.  Psychiatric/Behavioral: The patient is nervous/anxious.   All other systems reviewed and are negative.   All other systems reviewed and are negative.  The following is a summary of the past history medically, past history surgically, known current medicines, social history and family history.  This information is gathered electronically by the computer from prior information and documentation.  I review this each visit and have found including this information at this point in the chart is beneficial and informative.    Past Medical History:  Diagnosis Date  . Anxiety   . Generalized osteoarthritis   . GERD (gastroesophageal reflux disease)   . Hyperlipidemia   . Hypertension   . Hypothyroidism   . Insomnia     Past Surgical  History:  Procedure Laterality Date  . COLONOSCOPY  10/2014   Dr. Cristine Polio, outside hospital. Colonoscopy performed with Propofol, no polyps, small to medium size internal hemorrhoids noted.   [name] ECTOPIC PREGNANCY SURGERY    . ELBOW FRACTURE SURGERY    . HIATAL HERNIA REPAIR    . JOINT REPLACEMENT     bilateral knee  . TONSILLECTOMY      Family History  Problem Relation Age of Onset  . Colon cancer Neg Hx     Social History Social History   Tobacco Use  . Smoking status: Never Smoker  . Smokeless tobacco: Never Used  Substance Use Topics  . Alcohol use: No    Alcohol/week: 0.0 standard drinks  . Drug use: No    No Known Allergies  Current Outpatient Medications  Medication Sig Dispense Refill  . allopurinol (ZYLOPRIM) 300 MG tablet TK 1 T PO D  3  . ARMOUR THYROID 60 MG tablet Take 60 mg by mouth daily.   2  . bismuth subsalicylate (KAOPECTATE) 262 MG/15ML suspension Take 30 mLs by mouth every 6 (six) hours as needed for indigestion or diarrhea or loose stools.    . cholecalciferol (VITAMIN D) 1000 units tablet Take 5,000 Units by mouth daily.     [name] escitalopram (LEXAPRO) 10 MG tablet Take 10 mg by mouth every evening.     [name] esomeprazole (NEXIUM) 40 MG capsule TAKE 1 CAPSULE(40 MG) BY MOUTH TWICE DAILY BEFORE A MEAL 60 capsule 3  . estradiol (ESTRACE) 0.5 MG tablet TAKE ONE TABLET BY MOUTH  ONCE DAILY IN THE MORNING  2  . estradiol (ESTRACE) 1 MG tablet TK 1 T PO D  3  . gabapentin (NEURONTIN) 400 MG capsule Take 1 capsule by mouth 3 (three) times daily.   4  . HYDROcodone-acetaminophen (NORCO/VICODIN) 5-325 MG tablet One tablet every four hours as needed for acute pain.  Limit of five days per [place] statue. 30 tablet 0  . methocarbamol (ROBAXIN) 750 MG tablet Take 1 tablet (750 mg total) by mouth 2 (two) times daily as needed for muscle spasms. 30 tablet 1  . NP THYROID 90 MG tablet TK 1 T PO QD  3  . progesterone (PROMETRIUM) 100 MG capsule TK 1 C PO QPM  2  .  progesterone (PROMETRIUM) 200 MG capsule TK 2 CS PO QHS  3   No current facility-administered medications for this visit.      Physical Exam  Blood pressure (!) 165/77, pulse 78, height 5\' 1"  (1.549 m), weight 202 lb (91.6 kg).  Constitutional: overall normal hygiene, normal nutrition, well developed, normal grooming, normal body habitus. Assistive device:none  Musculoskeletal: gait and station Limp none, muscle tone and strength are normal, no tremors or atrophy is present.  .  Neurological: coordination overall normal.  Deep tendon reflex/nerve stretch intact.  Sensation normal.  Cranial nerves II-XII intact.   Skin:   Normal overall no scars, lesions, ulcers or rashes. No psoriasis.  Psychiatric: Alert and oriented x 3.  Recent memory intact, remote memory unclear.  Normal mood and affect. Well groomed.  Good eye contact.  Cardiovascular: overall no swelling, no varicosities, no edema bilaterally, normal temperatures of the legs and arms, no clubbing, cyanosis and good capillary refill.  Lymphatic: palpation is normal.  Neck has full ROM but she is tender in the left upper trapezius.  All other systems reviewed and are negative   The patient has been educated about the nature of the problem(s) and counseled on treatment options.  The patient appeared to understand what I have discussed and is in agreement with it.  Encounter Diagnoses  Name Primary?  . DDD (degenerative disc disease), cervical Yes  . Foraminal stenosis of cervical region   . Neck pain     PLAN Call if any problems.  Precautions discussed.  Continue current medications.   Return to clinic 1 month   Electronically Signed Sanjuana Kava, MD 12/19/20199:57 AM

## 2018-09-13 ENCOUNTER — Ambulatory Visit: Payer: Medicare Other | Admitting: Orthopaedic Surgery

## 2018-09-19 ENCOUNTER — Ambulatory Visit: Payer: Medicare Other | Admitting: Orthopaedic Surgery

## 2018-09-24 ENCOUNTER — Ambulatory Visit: Payer: Self-pay | Admitting: Cardiology

## 2018-09-26 ENCOUNTER — Ambulatory Visit (INDEPENDENT_AMBULATORY_CARE_PROVIDER_SITE_OTHER): Payer: Medicare Other | Admitting: Orthopaedic Surgery

## 2018-09-26 ENCOUNTER — Encounter: Payer: Self-pay | Admitting: Orthopaedic Surgery

## 2018-09-26 VITALS — BP 175/91 | HR 78 | Ht 61.0 in | Wt 206.0 lb

## 2018-09-26 DIAGNOSIS — M4802 Spinal stenosis, cervical region: Secondary | ICD-10-CM | POA: Diagnosis not present

## 2018-09-26 DIAGNOSIS — M542 Cervicalgia: Secondary | ICD-10-CM

## 2018-09-26 DIAGNOSIS — M25512 Pain in left shoulder: Secondary | ICD-10-CM

## 2018-09-26 DIAGNOSIS — G8929 Other chronic pain: Secondary | ICD-10-CM

## 2018-09-26 MED ORDER — METHOCARBAMOL 750 MG PO TABS: 750.0000 mg | ORAL_TABLET | Freq: Two times a day (BID) | ORAL | 1 refills | Status: DC | PRN

## 2018-09-26 NOTE — Progress Notes (Addendum)
Patient Annette Saunders, female DOB:Aug 22, 1946, 73 y.o. NOI:[id number]  Chief Complaint  Patient presents with  . Neck Pain    HPI  Annette Saunders is a 73 y.o. female who has continued neck and left shoulder pain.  She has no trauma, no weakness, no numbness.  She has taken Norco but does not like it.  She would like refill on Robaxin which she says helps.  I will give this. I will set up for PT.  She is homebound and will get home health.   Body mass index is 38.92 kg/m.  ROS  Review of Systems  Constitutional:       Patient does not have diabetes Patient has hypertension Patient does not have COPD Patient does not smoke.  HENT: Negative for congestion.   Respiratory: Negative for cough and shortness of breath.   Cardiovascular: Negative for chest pain.  Musculoskeletal: Positive for back pain, gait problem and myalgias.  Allergic/Immunologic: Negative for environmental allergies.  Psychiatric/Behavioral: The patient is nervous/anxious.   All other systems reviewed and are negative.   All other systems reviewed and are negative.  The following is a summary of the past history medically, past history surgically, known current medicines, social history and family history.  This information is gathered electronically by the computer from prior information and documentation.  I review this each visit and have found including this information at this point in the chart is beneficial and informative.    Past Medical History:  Diagnosis Date  . Anxiety   . Generalized osteoarthritis   . GERD (gastroesophageal reflux disease)   . Hyperlipidemia   . Hypertension   . Hypothyroidism   . Insomnia     Past Surgical History:  Procedure Laterality Date  . COLONOSCOPY  10/2014   Dr. Cristine Polio, outside hospital. Colonoscopy performed with Propofol, no polyps, small to medium size internal hemorrhoids noted.   [name] ECTOPIC PREGNANCY SURGERY    . ELBOW FRACTURE SURGERY    . HIATAL  HERNIA REPAIR    . JOINT REPLACEMENT     bilateral knee  . TONSILLECTOMY      Family History  Problem Relation Age of Onset  . Colon cancer Neg Hx     Social History Social History   Tobacco Use  . Smoking status: Never Smoker  . Smokeless tobacco: Never Used  Substance Use Topics  . Alcohol use: No    Alcohol/week: 0.0 standard drinks  . Drug use: No    No Known Allergies  Current Outpatient Medications  Medication Sig Dispense Refill  . allopurinol (ZYLOPRIM) 300 MG tablet TK 1 T PO D  3  . ARMOUR THYROID 60 MG tablet Take 60 mg by mouth daily.   2  . bismuth subsalicylate (KAOPECTATE) 262 MG/15ML suspension Take 30 mLs by mouth every 6 (six) hours as needed for indigestion or diarrhea or loose stools.    . cholecalciferol (VITAMIN D) 1000 units tablet Take 5,000 Units by mouth daily.     [name] escitalopram (LEXAPRO) 10 MG tablet Take 10 mg by mouth every evening.     [name] esomeprazole (NEXIUM) 40 MG capsule TAKE 1 CAPSULE(40 MG) BY MOUTH TWICE DAILY BEFORE A MEAL 60 capsule 3  . estradiol (ESTRACE) 0.5 MG tablet TAKE ONE TABLET BY MOUTH ONCE DAILY IN THE MORNING  2  . estradiol (ESTRACE) 1 MG tablet TK 1 T PO D  3  . gabapentin (NEURONTIN) 400 MG capsule Take 1 capsule by mouth 3 (three) times daily.  4  . HYDROcodone-acetaminophen (NORCO/VICODIN) 5-325 MG tablet One tablet every four hours as needed for acute pain.  Limit of five days per [place] statue. 30 tablet 0  . methocarbamol (ROBAXIN) 750 MG tablet Take 1 tablet (750 mg total) by mouth 2 (two) times daily as needed for muscle spasms. 30 tablet 1  . NP THYROID 90 MG tablet TK 1 T PO QD  3  . progesterone (PROMETRIUM) 100 MG capsule TK 1 C PO QPM  2  . progesterone (PROMETRIUM) 200 MG capsule TK 2 CS PO QHS  3   No current facility-administered medications for this visit.      Physical Exam  Blood pressure (!) 175/91, pulse 78, height 5\' 1"  (1.549 m), weight 206 lb (93.4 kg).  Constitutional: overall normal  hygiene, normal nutrition, well developed, normal grooming, normal body habitus. Assistive device:none  Musculoskeletal: gait and station Limp none, muscle tone and strength are normal, no tremors or atrophy is present.  .  Neurological: coordination overall normal.  Deep tendon reflex/nerve stretch intact.  Sensation normal.  Cranial nerves II-XII intact.   Skin:   Normal overall no scars, lesions, ulcers or rashes. No psoriasis.  Psychiatric: Alert and oriented x 3.  Recent memory intact, remote memory unclear.  Normal mood and affect. Well groomed.  Good eye contact.  Cardiovascular: overall no swelling, no varicosities, no edema bilaterally, normal temperatures of the legs and arms, no clubbing, cyanosis and good capillary refill.  Lymphatic: palpation is normal.  Neck has full motion but she is tender on the anterior left side of the neck.  Shoulder on left has full motion but tender in the extremes.  NV intact.  All other systems reviewed and are negative   The patient has been educated about the nature of the problem(s) and counseled on treatment options.  The patient appeared to understand what I have discussed and is in agreement with it.  Encounter Diagnoses  Name Primary?  . Neck pain Yes  . Foraminal stenosis of cervical region   . Chronic left shoulder pain     PLAN Call if any problems.  Precautions discussed.  Continue current medications. I hove added Robaxin again.  Return to clinic 1 month   Begin PT.  Electronically Signed Sanjuana Kava, MD 1/29/202011:12 AM  Patient is homebound secondary to her pain and condition.  Home health is requested for her PT.  Electronically Signed Sanjuana Kava, MD 1/30/202011:08 AM

## 2018-09-26 NOTE — Patient Instructions (Signed)
An order has been sent to [facility].  They will contact you to schedule a home visit.

## 2018-09-27 NOTE — Addendum Note (Signed)
Addended by: Elizabeth Sauer on: 09/27/2018 10:33 AM   Modules accepted: Orders

## 2018-09-28 DIAGNOSIS — M542 Cervicalgia: Secondary | ICD-10-CM | POA: Diagnosis not present

## 2018-09-28 DIAGNOSIS — I10 Essential (primary) hypertension: Secondary | ICD-10-CM | POA: Diagnosis not present

## 2018-09-28 DIAGNOSIS — G8929 Other chronic pain: Secondary | ICD-10-CM | POA: Diagnosis not present

## 2018-09-28 DIAGNOSIS — Z79899 Other long term (current) drug therapy: Secondary | ICD-10-CM | POA: Diagnosis not present

## 2018-09-28 DIAGNOSIS — M4802 Spinal stenosis, cervical region: Secondary | ICD-10-CM | POA: Diagnosis not present

## 2018-09-28 DIAGNOSIS — M25512 Pain in left shoulder: Secondary | ICD-10-CM | POA: Diagnosis not present

## 2018-09-28 DIAGNOSIS — Z742 Need for assistance at home and no other household member able to render care: Secondary | ICD-10-CM | POA: Diagnosis not present

## 2018-09-28 DIAGNOSIS — Z96653 Presence of artificial knee joint, bilateral: Secondary | ICD-10-CM | POA: Diagnosis not present

## 2018-09-28 DIAGNOSIS — M150 Primary generalized (osteo)arthritis: Secondary | ICD-10-CM | POA: Diagnosis not present

## 2018-10-03 DIAGNOSIS — M150 Primary generalized (osteo)arthritis: Secondary | ICD-10-CM | POA: Diagnosis not present

## 2018-10-03 DIAGNOSIS — M25512 Pain in left shoulder: Secondary | ICD-10-CM | POA: Diagnosis not present

## 2018-10-03 DIAGNOSIS — G8929 Other chronic pain: Secondary | ICD-10-CM | POA: Diagnosis not present

## 2018-10-03 DIAGNOSIS — I10 Essential (primary) hypertension: Secondary | ICD-10-CM | POA: Diagnosis not present

## 2018-10-03 DIAGNOSIS — M4802 Spinal stenosis, cervical region: Secondary | ICD-10-CM | POA: Diagnosis not present

## 2018-10-03 DIAGNOSIS — M542 Cervicalgia: Secondary | ICD-10-CM | POA: Diagnosis not present

## 2018-10-04 DIAGNOSIS — M542 Cervicalgia: Secondary | ICD-10-CM | POA: Diagnosis not present

## 2018-10-04 DIAGNOSIS — I10 Essential (primary) hypertension: Secondary | ICD-10-CM | POA: Diagnosis not present

## 2018-10-04 DIAGNOSIS — G8929 Other chronic pain: Secondary | ICD-10-CM | POA: Diagnosis not present

## 2018-10-04 DIAGNOSIS — M150 Primary generalized (osteo)arthritis: Secondary | ICD-10-CM | POA: Diagnosis not present

## 2018-10-04 DIAGNOSIS — M4802 Spinal stenosis, cervical region: Secondary | ICD-10-CM | POA: Diagnosis not present

## 2018-10-04 DIAGNOSIS — M25512 Pain in left shoulder: Secondary | ICD-10-CM | POA: Diagnosis not present

## 2018-10-05 DIAGNOSIS — M25512 Pain in left shoulder: Secondary | ICD-10-CM | POA: Diagnosis not present

## 2018-10-05 DIAGNOSIS — G8929 Other chronic pain: Secondary | ICD-10-CM | POA: Diagnosis not present

## 2018-10-05 DIAGNOSIS — M542 Cervicalgia: Secondary | ICD-10-CM | POA: Diagnosis not present

## 2018-10-05 DIAGNOSIS — M150 Primary generalized (osteo)arthritis: Secondary | ICD-10-CM | POA: Diagnosis not present

## 2018-10-05 DIAGNOSIS — M4802 Spinal stenosis, cervical region: Secondary | ICD-10-CM | POA: Diagnosis not present

## 2018-10-05 DIAGNOSIS — I10 Essential (primary) hypertension: Secondary | ICD-10-CM | POA: Diagnosis not present

## 2018-10-08 ENCOUNTER — Telehealth: Payer: Self-pay | Admitting: Orthopaedic Surgery

## 2018-10-08 DIAGNOSIS — Z23 Encounter for immunization: Secondary | ICD-10-CM | POA: Diagnosis not present

## 2018-10-08 DIAGNOSIS — E785 Hyperlipidemia, unspecified: Secondary | ICD-10-CM | POA: Diagnosis not present

## 2018-10-08 DIAGNOSIS — E559 Vitamin D deficiency, unspecified: Secondary | ICD-10-CM | POA: Diagnosis not present

## 2018-10-08 DIAGNOSIS — I10 Essential (primary) hypertension: Secondary | ICD-10-CM | POA: Diagnosis not present

## 2018-10-08 DIAGNOSIS — Z0000 Encounter for general adult medical examination without abnormal findings: Secondary | ICD-10-CM | POA: Diagnosis not present

## 2018-10-08 DIAGNOSIS — R5383 Other fatigue: Secondary | ICD-10-CM | POA: Diagnosis not present

## 2018-10-08 DIAGNOSIS — Z139 Encounter for screening, unspecified: Secondary | ICD-10-CM | POA: Diagnosis not present

## 2018-10-08 NOTE — Telephone Encounter (Signed)
Call received via voice message on Friday 10/05/18, after hours from [place], physical therapist - requests verbal orders for therapy 2 times per week for 3 weeks; states may leave a message on her secure voice mail. Direct [phone]

## 2018-10-08 NOTE — Telephone Encounter (Signed)
Also received call from Verl Dicker at [facility] - may call her with the verbal orders noted regarding therapy 2 times per week for 3 weeks - Phone# [phone]

## 2018-10-09 DIAGNOSIS — G8929 Other chronic pain: Secondary | ICD-10-CM | POA: Diagnosis not present

## 2018-10-09 DIAGNOSIS — I10 Essential (primary) hypertension: Secondary | ICD-10-CM | POA: Diagnosis not present

## 2018-10-09 DIAGNOSIS — M25512 Pain in left shoulder: Secondary | ICD-10-CM | POA: Diagnosis not present

## 2018-10-09 DIAGNOSIS — M4802 Spinal stenosis, cervical region: Secondary | ICD-10-CM | POA: Diagnosis not present

## 2018-10-09 DIAGNOSIS — M150 Primary generalized (osteo)arthritis: Secondary | ICD-10-CM | POA: Diagnosis not present

## 2018-10-09 DIAGNOSIS — M542 Cervicalgia: Secondary | ICD-10-CM | POA: Diagnosis not present

## 2018-10-11 ENCOUNTER — Other Ambulatory Visit (HOSPITAL_COMMUNITY): Payer: Self-pay | Admitting: Internal Medicine

## 2018-10-11 DIAGNOSIS — M542 Cervicalgia: Secondary | ICD-10-CM | POA: Diagnosis not present

## 2018-10-11 DIAGNOSIS — G8929 Other chronic pain: Secondary | ICD-10-CM | POA: Diagnosis not present

## 2018-10-11 DIAGNOSIS — M150 Primary generalized (osteo)arthritis: Secondary | ICD-10-CM | POA: Diagnosis not present

## 2018-10-11 DIAGNOSIS — Z780 Asymptomatic menopausal state: Secondary | ICD-10-CM

## 2018-10-11 DIAGNOSIS — M25512 Pain in left shoulder: Secondary | ICD-10-CM | POA: Diagnosis not present

## 2018-10-11 DIAGNOSIS — Z1231 Encounter for screening mammogram for malignant neoplasm of breast: Secondary | ICD-10-CM

## 2018-10-11 DIAGNOSIS — M4802 Spinal stenosis, cervical region: Secondary | ICD-10-CM | POA: Diagnosis not present

## 2018-10-11 DIAGNOSIS — I10 Essential (primary) hypertension: Secondary | ICD-10-CM | POA: Diagnosis not present

## 2018-10-16 DIAGNOSIS — M25512 Pain in left shoulder: Secondary | ICD-10-CM | POA: Diagnosis not present

## 2018-10-16 DIAGNOSIS — M4802 Spinal stenosis, cervical region: Secondary | ICD-10-CM | POA: Diagnosis not present

## 2018-10-16 DIAGNOSIS — G8929 Other chronic pain: Secondary | ICD-10-CM | POA: Diagnosis not present

## 2018-10-16 DIAGNOSIS — M542 Cervicalgia: Secondary | ICD-10-CM | POA: Diagnosis not present

## 2018-10-16 DIAGNOSIS — M150 Primary generalized (osteo)arthritis: Secondary | ICD-10-CM | POA: Diagnosis not present

## 2018-10-16 DIAGNOSIS — I10 Essential (primary) hypertension: Secondary | ICD-10-CM | POA: Diagnosis not present

## 2018-10-22 ENCOUNTER — Other Ambulatory Visit (HOSPITAL_COMMUNITY): Payer: Self-pay

## 2018-10-22 ENCOUNTER — Ambulatory Visit (HOSPITAL_COMMUNITY): Payer: Self-pay

## 2018-10-22 DIAGNOSIS — M542 Cervicalgia: Secondary | ICD-10-CM | POA: Diagnosis not present

## 2018-10-22 DIAGNOSIS — G8929 Other chronic pain: Secondary | ICD-10-CM | POA: Diagnosis not present

## 2018-10-22 DIAGNOSIS — M150 Primary generalized (osteo)arthritis: Secondary | ICD-10-CM | POA: Diagnosis not present

## 2018-10-22 DIAGNOSIS — M4802 Spinal stenosis, cervical region: Secondary | ICD-10-CM | POA: Diagnosis not present

## 2018-10-22 DIAGNOSIS — I10 Essential (primary) hypertension: Secondary | ICD-10-CM | POA: Diagnosis not present

## 2018-10-22 DIAGNOSIS — M25512 Pain in left shoulder: Secondary | ICD-10-CM | POA: Diagnosis not present

## 2018-10-23 ENCOUNTER — Ambulatory Visit: Payer: Medicare Other | Admitting: Orthopaedic Surgery

## 2018-10-23 DIAGNOSIS — I10 Essential (primary) hypertension: Secondary | ICD-10-CM | POA: Diagnosis not present

## 2018-10-23 DIAGNOSIS — M25512 Pain in left shoulder: Secondary | ICD-10-CM | POA: Diagnosis not present

## 2018-10-23 DIAGNOSIS — M4802 Spinal stenosis, cervical region: Secondary | ICD-10-CM | POA: Diagnosis not present

## 2018-10-23 DIAGNOSIS — G8929 Other chronic pain: Secondary | ICD-10-CM | POA: Diagnosis not present

## 2018-10-23 DIAGNOSIS — M150 Primary generalized (osteo)arthritis: Secondary | ICD-10-CM | POA: Diagnosis not present

## 2018-10-23 DIAGNOSIS — M542 Cervicalgia: Secondary | ICD-10-CM | POA: Diagnosis not present

## 2018-10-23 NOTE — Progress Notes (Deleted)
history of GERD, history of large hiatal hernia repair a few years ago by Dr. Duke Salvia, but we have had difficulty obtaining records. Reportedly had an upper endoscopy by Dr. Cristine Polio several years ago, but this was unable to be retrieved as well. Colonoscopy on file from 2016.

## 2018-10-24 ENCOUNTER — Encounter

## 2018-10-24 ENCOUNTER — Ambulatory Visit: Payer: Medicare Other | Admitting: Gastroenterology

## 2018-10-25 DIAGNOSIS — G8929 Other chronic pain: Secondary | ICD-10-CM | POA: Diagnosis not present

## 2018-10-25 DIAGNOSIS — M150 Primary generalized (osteo)arthritis: Secondary | ICD-10-CM | POA: Diagnosis not present

## 2018-10-25 DIAGNOSIS — I10 Essential (primary) hypertension: Secondary | ICD-10-CM | POA: Diagnosis not present

## 2018-10-25 DIAGNOSIS — M25512 Pain in left shoulder: Secondary | ICD-10-CM | POA: Diagnosis not present

## 2018-10-25 DIAGNOSIS — M542 Cervicalgia: Secondary | ICD-10-CM | POA: Diagnosis not present

## 2018-10-25 DIAGNOSIS — M4802 Spinal stenosis, cervical region: Secondary | ICD-10-CM | POA: Diagnosis not present

## 2018-10-26 DIAGNOSIS — M4802 Spinal stenosis, cervical region: Secondary | ICD-10-CM | POA: Diagnosis not present

## 2018-10-26 DIAGNOSIS — G8929 Other chronic pain: Secondary | ICD-10-CM | POA: Diagnosis not present

## 2018-10-26 DIAGNOSIS — M25512 Pain in left shoulder: Secondary | ICD-10-CM | POA: Diagnosis not present

## 2018-10-26 DIAGNOSIS — I10 Essential (primary) hypertension: Secondary | ICD-10-CM | POA: Diagnosis not present

## 2018-10-26 DIAGNOSIS — M542 Cervicalgia: Secondary | ICD-10-CM | POA: Diagnosis not present

## 2018-10-26 DIAGNOSIS — M150 Primary generalized (osteo)arthritis: Secondary | ICD-10-CM | POA: Diagnosis not present

## 2018-10-30 ENCOUNTER — Encounter: Payer: Self-pay | Admitting: Orthopaedic Surgery

## 2018-10-30 ENCOUNTER — Ambulatory Visit (INDEPENDENT_AMBULATORY_CARE_PROVIDER_SITE_OTHER): Payer: Medicare Other | Admitting: Orthopaedic Surgery

## 2018-10-30 VITALS — BP 117/63 | HR 91 | Ht 61.0 in | Wt 200.0 lb

## 2018-10-30 DIAGNOSIS — M542 Cervicalgia: Secondary | ICD-10-CM

## 2018-10-30 DIAGNOSIS — G8929 Other chronic pain: Secondary | ICD-10-CM | POA: Diagnosis not present

## 2018-10-30 DIAGNOSIS — M25512 Pain in left shoulder: Secondary | ICD-10-CM

## 2018-10-30 NOTE — Progress Notes (Signed)
Patient ON:Annette Saunders, female DOB:Apr 08, 1946, 73 y.o. LKG:[id number]  Chief Complaint  Patient presents with  . Neck Pain    Nech pain.    HPI  Annette Saunders is a 73 y.o. female who has neck pain and shoulder pain on the left.  She is improved. She has been seen by home PT and is doing much better.  She still has some neck pain but much less than before.  Her shoulder is doing much better.  She has no numbness.  She is doing the exercises they told her to do.   Body mass index is 37.79 kg/m.  ROS  Review of Systems  Constitutional: Positive for activity change.       Patient does not have diabetes Patient has hypertension Patient does not have COPD Patient does not smoke.  HENT: Negative for congestion.   Respiratory: Negative for cough and shortness of breath.   Cardiovascular: Negative for chest pain.  Musculoskeletal: Positive for back pain, gait problem, myalgias and neck pain.  Allergic/Immunologic: Negative for environmental allergies.  Psychiatric/Behavioral: The patient is nervous/anxious.   All other systems reviewed and are negative.   All other systems reviewed and are negative.  The following is a summary of the past history medically, past history surgically, known current medicines, social history and family history.  This information is gathered electronically by the computer from prior information and documentation.  I review this each visit and have found including this information at this point in the chart is beneficial and informative.    Past Medical History:  Diagnosis Date  . Anxiety   . Generalized osteoarthritis   . GERD (gastroesophageal reflux disease)   . Hyperlipidemia   . Hypertension   . Hypothyroidism   . Insomnia     Past Surgical History:  Procedure Laterality Date  . COLONOSCOPY  10/2014   Dr. Cristine Polio, outside hospital. Colonoscopy performed with Propofol, no polyps, small to medium size internal hemorrhoids noted.   [name] ECTOPIC  PREGNANCY SURGERY    . ELBOW FRACTURE SURGERY    . HIATAL HERNIA REPAIR    . JOINT REPLACEMENT     bilateral knee  . TONSILLECTOMY      Family History  Problem Relation Age of Onset  . Colon cancer Neg Hx     Social History Social History   Tobacco Use  . Smoking status: Never Smoker  . Smokeless tobacco: Never Used  Substance Use Topics  . Alcohol use: No    Alcohol/week: 0.0 standard drinks  . Drug use: No    No Known Allergies  Current Outpatient Medications  Medication Sig Dispense Refill  . allopurinol (ZYLOPRIM) 300 MG tablet TK 1 T PO D  3  . ARMOUR THYROID 60 MG tablet Take 60 mg by mouth daily.   2  . bismuth subsalicylate (KAOPECTATE) 262 MG/15ML suspension Take 30 mLs by mouth every 6 (six) hours as needed for indigestion or diarrhea or loose stools.    . cholecalciferol (VITAMIN D) 1000 units tablet Take 5,000 Units by mouth daily.     [name] escitalopram (LEXAPRO) 10 MG tablet Take 10 mg by mouth every evening.     [name] esomeprazole (NEXIUM) 40 MG capsule TAKE 1 CAPSULE(40 MG) BY MOUTH TWICE DAILY BEFORE A MEAL 60 capsule 3  . estradiol (ESTRACE) 0.5 MG tablet TAKE ONE TABLET BY MOUTH ONCE DAILY IN THE MORNING  2  . estradiol (ESTRACE) 1 MG tablet TK 1 T PO D  3  .  gabapentin (NEURONTIN) 400 MG capsule Take 1 capsule by mouth 3 (three) times daily.   4  . HYDROcodone-acetaminophen (NORCO/VICODIN) 5-325 MG tablet One tablet every four hours as needed for acute pain.  Limit of five days per [place] statue. 30 tablet 0  . methocarbamol (ROBAXIN) 750 MG tablet Take 1 tablet (750 mg total) by mouth 2 (two) times daily as needed for muscle spasms. 30 tablet 1  . NP THYROID 90 MG tablet TK 1 T PO QD  3  . progesterone (PROMETRIUM) 100 MG capsule TK 1 C PO QPM  2  . progesterone (PROMETRIUM) 200 MG capsule TK 2 CS PO QHS  3   No current facility-administered medications for this visit.      Physical Exam  Blood pressure 117/63, pulse 91, height 5\' 1"  (1.549 m), weight  200 lb (90.7 kg).  Constitutional: overall normal hygiene, normal nutrition, well developed, normal grooming, normal body habitus. Assistive device:none  Musculoskeletal: gait and station Limp none, muscle tone and strength are normal, no tremors or atrophy is present.  .  Neurological: coordination overall normal.  Deep tendon reflex/nerve stretch intact.  Sensation normal.  Cranial nerves II-XII intact.   Skin:   Normal overall no scars, lesions, ulcers or rashes. No psoriasis.  Psychiatric: Alert and oriented x 3.  Recent memory intact, remote memory unclear.  Normal mood and affect. Well groomed.  Good eye contact.  Cardiovascular: overall no swelling, no varicosities, no edema bilaterally, normal temperatures of the legs and arms, no clubbing, cyanosis and good capillary refill.  Lymphatic: palpation is normal.  Neck has near full ROM and so does the left shoulder.  NV intact.  All other systems reviewed and are negative   The patient has been educated about the nature of the problem(s) and counseled on treatment options.  The patient appeared to understand what I have discussed and is in agreement with it.  Encounter Diagnoses  Name Primary?  . Neck pain Yes  . Chronic left shoulder pain     PLAN Call if any problems.  Precautions discussed.  Continue current medications.   Return to clinic prn   Electronically Signed Sanjuana Kava, MD 3/3/202010:17 AM

## 2018-10-31 ENCOUNTER — Ambulatory Visit (HOSPITAL_COMMUNITY)
Admission: RE | Admit: 2018-10-31 | Discharge: 2018-10-31 | Disposition: A | Discharge status: Home / Self Care | Payer: Medicare Other | Source: Ambulatory Visit | Attending: Internal Medicine | Admitting: Internal Medicine

## 2018-10-31 DIAGNOSIS — Z780 Asymptomatic menopausal state: Secondary | ICD-10-CM

## 2018-10-31 DIAGNOSIS — M85852 Other specified disorders of bone density and structure, left thigh: Secondary | ICD-10-CM | POA: Diagnosis not present

## 2018-10-31 DIAGNOSIS — Z1231 Encounter for screening mammogram for malignant neoplasm of breast: Secondary | ICD-10-CM | POA: Insufficient documentation

## 2018-12-05 ENCOUNTER — Other Ambulatory Visit: Payer: Self-pay | Admitting: Gastroenterology

## 2018-12-06 ENCOUNTER — Other Ambulatory Visit: Payer: Self-pay | Admitting: Nurse Practitioner

## 2018-12-25 DIAGNOSIS — N951 Menopausal and female climacteric states: Secondary | ICD-10-CM | POA: Diagnosis not present

## 2018-12-25 DIAGNOSIS — R21 Rash and other nonspecific skin eruption: Secondary | ICD-10-CM | POA: Diagnosis not present

## 2018-12-25 DIAGNOSIS — R109 Unspecified abdominal pain: Secondary | ICD-10-CM | POA: Diagnosis not present

## 2018-12-25 DIAGNOSIS — R5383 Other fatigue: Secondary | ICD-10-CM | POA: Diagnosis not present

## 2018-12-25 DIAGNOSIS — N959 Unspecified menopausal and perimenopausal disorder: Secondary | ICD-10-CM | POA: Diagnosis not present

## 2018-12-27 ENCOUNTER — Other Ambulatory Visit: Payer: Self-pay

## 2018-12-27 ENCOUNTER — Encounter: Payer: Self-pay | Admitting: Gastroenterology

## 2018-12-27 ENCOUNTER — Ambulatory Visit (INDEPENDENT_AMBULATORY_CARE_PROVIDER_SITE_OTHER): Payer: Medicare Other | Admitting: Gastroenterology

## 2018-12-27 DIAGNOSIS — K219 Gastro-esophageal reflux disease without esophagitis: Secondary | ICD-10-CM | POA: Diagnosis not present

## 2018-12-27 NOTE — Progress Notes (Signed)
Primary Care Physician:  Doree Albee, MD  Primary GI: Dr. Oneida Alar   Virtual Visit via Telephone Note Due to COVID-19, visit is conducted virtually and was requested by patient.   I connected with Annette Saunders on 12/27/18 at  9:30 AM EDT by telephone and verified that I am speaking with the correct person using two identifiers.   I discussed the limitations, risks, security and privacy concerns of performing an evaluation and management service by telephone and the availability of in person appointments. I also discussed with the patient that there may be a patient responsible charge related to this service. The patient expressed understanding and agreed to proceed.  Chief Complaint  Patient presents with  . Dysphagia    f/u, doing ok     History of Present Illness: 73 year old female with history of GERD, history of large hiatal hernia repair a few years ago by Dr. Duke Salvia, but we have had difficulty obtaining records. Reportedly had an upper endoscopy by Dr. Cristine Polio several years ago, but this was unable to be retrieved as well. Colonoscopy on file from 2016. Was having difficulties last year when seen, noting early satiety, feeling "food stacking up". Wanted to hold off on EGD at that time. BPE April 2019 with prior fundoplication without recurrent hiatal hernia, otherwise normal.   No dysphagia currently. Taking Nexium BID. No abdominal pain. Wants to decrease Nexium to once a day. Appetite is "alright". Sometimes feels full right away. Eating a lot of salads. Feels like something is in her throat, globus sensation. Does not want to pursue EGD now and would rather wait till she can be seen in person.  Interestingly, she was bit by a spider recently and prescribed doxycycline, developed blurred vision in left eye. She stopped immediately after calling PCP. I have added this to her drug allergy list.    Past Medical History:  Diagnosis Date  . Anxiety   . Generalized  osteoarthritis   . GERD (gastroesophageal reflux disease)   . Hyperlipidemia   . Hypertension   . Hypothyroidism   . Insomnia      Past Surgical History:  Procedure Laterality Date  . COLONOSCOPY  10/2014   Dr. Cristine Polio, outside hospital. Colonoscopy performed with Propofol, no polyps, small to medium size internal hemorrhoids noted.   [name] ECTOPIC PREGNANCY SURGERY    . ELBOW FRACTURE SURGERY    . HIATAL HERNIA REPAIR    . JOINT REPLACEMENT     bilateral knee  . TONSILLECTOMY       Current Meds  Medication Sig  . allopurinol (ZYLOPRIM) 300 MG tablet TK 1 T PO D  . cholecalciferol (VITAMIN D) 1000 units tablet Take 5,000 Units by mouth daily.   [name] escitalopram (LEXAPRO) 10 MG tablet Take 5 mg by mouth every evening.   [name] esomeprazole (NEXIUM) 40 MG capsule TAKE 1 CAPSULE(40 MG) BY MOUTH TWICE DAILY BEFORE A MEAL  . estradiol (ESTRACE) 2 MG tablet Take 2 mg by mouth daily.  . NP THYROID PO Take by mouth. 0.5mg  daily  . NP THYROID PO Take by mouth. NP thyroid 2 120 mg daily  . progesterone (PROMETRIUM) 200 MG capsule TK 2 CS PO QHS  . progesterone (PROMETRIUM) 200 MG capsule Take 400 mg by mouth at bedtime.      Review of Systems: Gen: Denies fever, chills, anorexia. Denies fatigue, weakness, weight loss.  CV: Denies chest pain, palpitations, syncope, peripheral edema, and claudication. Resp: Denies dyspnea at rest, cough, wheezing,  coughing up blood, and pleurisy. GI: see HPI Derm: Denies rash, itching, dry skin Psych: Denies depression, anxiety, memory loss, confusion. No homicidal or suicidal ideation.  Heme: Denies bruising, bleeding, and enlarged lymph nodes.  Observations/Objective: No distress. Video call performed. Alert and oriented. Pleasant. Dressed appropriately. Skin normal. Without scleral icterus.   Assessment and Plan: 73 year old female with history of GERD, large hiatal hernia repair a few years ago by Dr. Daiva Huge, but we have had difficulty obtaining these  records. Last EGD by Dr. Cristine Polio several years ago, which have also been unable to be retrieved. Recommending EGD due to persistent early satiety, globus sensation, with BPE last year unrevealing. She would like to wait until COVID restrictions are lifted some. We will see her in 3 months and arrange EGD at that time. Continue Nexium once to twice a day.   Follow Up Instructions: See AVS   I discussed the assessment and treatment plan with the patient. The patient was provided an opportunity to ask questions and all were answered. The patient agreed with the plan and demonstrated an understanding of the instructions.   The patient was advised to call back or seek an in-person evaluation if the symptoms worsen or if the condition fails to improve as anticipated.  I provided 15 minutes of non-face-to-face time during this encounter.  Annitta Needs, PhD, ANP-BC [facility]

## 2018-12-27 NOTE — Patient Instructions (Addendum)
You can decrease Nexium to once per day, if you are able to tolerate it.  I will see you in 3 months, and we will arrange the upper endoscopy then!  It was so great to "see you" virtually! I look forward to seeing you again!  I enjoyed seeing you again today! As you know, I value our relationship and want to provide genuine, compassionate, and quality care. I welcome your feedback. If you receive a survey regarding your visit,  I greatly appreciate you taking time to fill this out. See you next time!  Annitta Needs, PhD, ANP-BC [facility]

## 2018-12-31 NOTE — Progress Notes (Signed)
CC'D TO PCP °

## 2019-01-02 DIAGNOSIS — I8002 Phlebitis and thrombophlebitis of superficial vessels of left lower extremity: Secondary | ICD-10-CM | POA: Diagnosis not present

## 2019-01-02 DIAGNOSIS — L720 Epidermal cyst: Secondary | ICD-10-CM | POA: Diagnosis not present

## 2019-01-02 DIAGNOSIS — I83812 Varicose veins of left lower extremities with pain: Secondary | ICD-10-CM | POA: Diagnosis not present

## 2019-01-02 DIAGNOSIS — L918 Other hypertrophic disorders of the skin: Secondary | ICD-10-CM | POA: Diagnosis not present

## 2019-01-02 DIAGNOSIS — I82402 Acute embolism and thrombosis of unspecified deep veins of left lower extremity: Secondary | ICD-10-CM | POA: Diagnosis not present

## 2019-01-02 DIAGNOSIS — I8312 Varicose veins of left lower extremity with inflammation: Secondary | ICD-10-CM | POA: Diagnosis not present

## 2019-01-07 ENCOUNTER — Ambulatory Visit (INDEPENDENT_AMBULATORY_CARE_PROVIDER_SITE_OTHER): Payer: Self-pay | Admitting: Nurse Practitioner

## 2019-01-22 ENCOUNTER — Other Ambulatory Visit: Payer: Self-pay | Admitting: Nurse Practitioner

## 2019-01-22 ENCOUNTER — Other Ambulatory Visit (HOSPITAL_COMMUNITY): Payer: Self-pay | Admitting: Nurse Practitioner

## 2019-01-22 DIAGNOSIS — R109 Unspecified abdominal pain: Secondary | ICD-10-CM

## 2019-01-25 ENCOUNTER — Other Ambulatory Visit: Payer: Self-pay

## 2019-01-25 ENCOUNTER — Encounter (HOSPITAL_COMMUNITY): Payer: Self-pay

## 2019-01-25 ENCOUNTER — Ambulatory Visit (HOSPITAL_COMMUNITY)
Admission: RE | Admit: 2019-01-25 | Discharge: 2019-01-25 | Disposition: A | Discharge status: Home / Self Care | Payer: Medicare Other | Source: Ambulatory Visit | Attending: Nurse Practitioner | Admitting: Nurse Practitioner

## 2019-01-25 ENCOUNTER — Other Ambulatory Visit (HOSPITAL_COMMUNITY): Payer: Self-pay | Admitting: Nurse Practitioner

## 2019-01-25 DIAGNOSIS — R109 Unspecified abdominal pain: Secondary | ICD-10-CM

## 2019-01-25 DIAGNOSIS — K5730 Diverticulosis of large intestine without perforation or abscess without bleeding: Secondary | ICD-10-CM | POA: Diagnosis not present

## 2019-01-25 LAB — POCT I-STAT CREATININE: Creatinine, Ser: 0.8 mg/dL (ref 0.44–1.00)

## 2019-01-25 MED ORDER — IOHEXOL 300 MG/ML  SOLN
100.0000 mL | Freq: Once | INTRAMUSCULAR | Status: AC | PRN
  Administered 2019-01-25: 100 mL via INTRAVENOUS

## 2019-01-28 DIAGNOSIS — R109 Unspecified abdominal pain: Secondary | ICD-10-CM | POA: Diagnosis not present

## 2019-02-05 DIAGNOSIS — R109 Unspecified abdominal pain: Secondary | ICD-10-CM | POA: Diagnosis not present

## 2019-02-05 DIAGNOSIS — E669 Obesity, unspecified: Secondary | ICD-10-CM | POA: Diagnosis not present

## 2019-02-05 DIAGNOSIS — R5383 Other fatigue: Secondary | ICD-10-CM | POA: Diagnosis not present

## 2019-02-12 ENCOUNTER — Other Ambulatory Visit: Payer: Self-pay

## 2019-02-12 ENCOUNTER — Telehealth: Payer: Self-pay | Admitting: Gastroenterology

## 2019-02-12 DIAGNOSIS — R197 Diarrhea, unspecified: Secondary | ICD-10-CM

## 2019-02-12 NOTE — Telephone Encounter (Signed)
C/o slimy diarrhea x 5 days (7-10 times daily). Pt has abdominal pain. Pt had a CT scan 2 weeks and per pt, small pockets very found. She has taken Pepo Bismol. She is drinking water with cumbers and drinking bone broth. No nausea, no vomiting. Pain in the abdomen is at a level 7 with 10 being the highest number. Pt is taking Nexium 1 pill nightly as directed.

## 2019-02-12 NOTE — Telephone Encounter (Signed)
Spoke with pt. Her pain level isn't severe at the moment. Pt is going to check stool studies tomorrow. Pt is aware that if her pain worsens or becomes severe, she needs to go to the ED.

## 2019-02-12 NOTE — Telephone Encounter (Signed)
She has had exposure to antibiotics a few months ago. Diverticulosis on recent CT. If severe pain, needs to go to ED. Otherwise, let's check Cdiff and stool culture. Needs to seek urgent evaluation if persistent pain or other alarm features.

## 2019-02-12 NOTE — Telephone Encounter (Signed)
[phone] please call patient, she is having bad stomach pain,  I have also added her to a cancellation list for a sooner appointment

## 2019-02-14 DIAGNOSIS — R197 Diarrhea, unspecified: Secondary | ICD-10-CM | POA: Diagnosis not present

## 2019-02-15 LAB — C. DIFFICILE GDH AND TOXIN A/B
GDH ANTIGEN: NOT DETECTED
MICRO NUMBER:: 584352
SPECIMEN QUALITY:: ADEQUATE
TOXIN A AND B: NOT DETECTED

## 2019-02-19 DIAGNOSIS — I8002 Phlebitis and thrombophlebitis of superficial vessels of left lower extremity: Secondary | ICD-10-CM | POA: Diagnosis not present

## 2019-02-19 DIAGNOSIS — I8312 Varicose veins of left lower extremity with inflammation: Secondary | ICD-10-CM | POA: Diagnosis not present

## 2019-02-20 NOTE — Progress Notes (Signed)
Cdiff negative. How is patient doing?

## 2019-02-21 ENCOUNTER — Telehealth: Payer: Self-pay | Admitting: Gastroenterology

## 2019-02-21 NOTE — Telephone Encounter (Signed)
[phone]  Please call patient, she wants to have a test to find out if she is lactose intolerant and she needs a test code for her insurance because they stated [employer] would pay for that

## 2019-02-21 NOTE — Telephone Encounter (Signed)
Forwarding to Anna Boone, NP to advise! 

## 2019-02-22 NOTE — Progress Notes (Signed)
Patient is on the waiting list for sooner appointment.

## 2019-02-22 NOTE — Telephone Encounter (Signed)
We don't have that capability here. May do trial of lactose-free. Can send information in the mail.

## 2019-02-25 NOTE — Telephone Encounter (Signed)
Pt is aware and information placed in the mail.

## 2019-02-27 DIAGNOSIS — E039 Hypothyroidism, unspecified: Secondary | ICD-10-CM | POA: Diagnosis not present

## 2019-02-27 DIAGNOSIS — R5383 Other fatigue: Secondary | ICD-10-CM | POA: Diagnosis not present

## 2019-02-27 DIAGNOSIS — I10 Essential (primary) hypertension: Secondary | ICD-10-CM | POA: Diagnosis not present

## 2019-02-27 DIAGNOSIS — E559 Vitamin D deficiency, unspecified: Secondary | ICD-10-CM | POA: Diagnosis not present

## 2019-02-27 DIAGNOSIS — K529 Noninfective gastroenteritis and colitis, unspecified: Secondary | ICD-10-CM | POA: Diagnosis not present

## 2019-02-28 DIAGNOSIS — K529 Noninfective gastroenteritis and colitis, unspecified: Secondary | ICD-10-CM | POA: Diagnosis not present

## 2019-03-04 DIAGNOSIS — K529 Noninfective gastroenteritis and colitis, unspecified: Secondary | ICD-10-CM | POA: Diagnosis not present

## 2019-03-04 DIAGNOSIS — I10 Essential (primary) hypertension: Secondary | ICD-10-CM | POA: Diagnosis not present

## 2019-03-06 ENCOUNTER — Other Ambulatory Visit: Payer: Self-pay

## 2019-03-06 DIAGNOSIS — Z20822 Contact with and (suspected) exposure to covid-19: Secondary | ICD-10-CM

## 2019-03-11 LAB — NOVEL CORONAVIRUS, NAA: SARS-CoV-2, NAA: NOT DETECTED

## 2019-03-14 ENCOUNTER — Ambulatory Visit: Payer: Medicare Other | Admitting: Gastroenterology

## 2019-03-19 ENCOUNTER — Ambulatory Visit: Payer: Medicare Other | Admitting: Orthopaedic Surgery

## 2019-03-20 DIAGNOSIS — K529 Noninfective gastroenteritis and colitis, unspecified: Secondary | ICD-10-CM | POA: Diagnosis not present

## 2019-03-20 DIAGNOSIS — R5383 Other fatigue: Secondary | ICD-10-CM | POA: Diagnosis not present

## 2019-03-28 ENCOUNTER — Other Ambulatory Visit: Payer: Self-pay

## 2019-03-28 ENCOUNTER — Encounter: Payer: Self-pay | Admitting: Gastroenterology

## 2019-03-28 ENCOUNTER — Ambulatory Visit (INDEPENDENT_AMBULATORY_CARE_PROVIDER_SITE_OTHER): Payer: Medicare Other | Admitting: Gastroenterology

## 2019-03-28 ENCOUNTER — Ambulatory Visit: Payer: Medicare Other | Admitting: Gastroenterology

## 2019-03-28 VITALS — BP 125/78 | HR 62 | Temp 97.1°F | Ht 61.0 in | Wt 195.0 lb

## 2019-03-28 DIAGNOSIS — R197 Diarrhea, unspecified: Secondary | ICD-10-CM | POA: Diagnosis not present

## 2019-03-28 DIAGNOSIS — R1310 Dysphagia, unspecified: Secondary | ICD-10-CM

## 2019-03-28 MED ORDER — DICYCLOMINE HCL 10 MG PO CAPS: 10.0000 mg | ORAL_CAPSULE | Freq: Three times a day (TID) | ORAL | 3 refills | Status: DC

## 2019-03-28 NOTE — Patient Instructions (Signed)
I sent in Bentyl to take once before meals and at bedtime as needed. No more than 4 times a day. Let's start with just twice a day. Monitor for constipation, dry mouth, dizziness. If this occurs, stop and let me know.   Keep [place] updated on the procedure! We will then schedule a colonoscopy, upper endoscopy, and dilation with Dr. Oneida Alar!  I enjoyed seeing you again today! As you know, I value our relationship and want to provide genuine, compassionate, and quality care. I welcome your feedback. If you receive a survey regarding your visit,  I greatly appreciate you taking time to fill this out. See you next time!  Annitta Needs, PhD, ANP-BC [facility]

## 2019-03-28 NOTE — Progress Notes (Signed)
Referring Provider: Doree Albee, MD Primary Care Physician:  Doree Albee, MD  Primary GI: Dr. Oneida Alar  Chief Complaint  Patient presents with  . Abdominal Pain    left and right lower quad pain  . Diarrhea    HPI:   Annette Saunders is a 73 y.o. female presenting today with a history of GERD, history of large hiatal hernia repair a few years ago by Dr. Duke Salvia, but we have had difficulty obtaining records. Reportedly had an upper endoscopy by Dr. Cristine Polio several years ago, but this was unable to be retrieved as well. Colonoscopy on file from 2016. BPE April 2019 with prior fundoplication without recurrent hiatal hernia, otherwise normal.    [place] stool scale #1 and 6. Not eating. Can only eat eggs in the morning. Night eats broccoli and chicken and able to tolerate. Avoiding dairy products. 3-4 watery stools a day. Night is the worst. Exhausted. Stools greasy as well. Gets dry heaves occasionally but not daily. Will be sitting on toilet and have to use the basket. LLQ pain, some RLQ. Happens prior to diarrhea then relieved. Started 3 months ago. Postprandial loose stool. Nocturnal that wakes her up. Taking imodium or pepto. Takes lactaid tablets prior to eating. Imodium better than Pepto. April 2019 was 219. Today 195. Weight loss noted since Jan of approximately 10 lbs.   Superficial left lower extremity blood clot. Procedure to be done by [facility] in [facility] April 17, 2019 in the office. Follow-up Apr 19, 2019. On Eliquis currently.   Dysphagia: difficulty with pushing foods down. Wants to pursue EGD now. Nexium BID.   Thus far CT unrevealing. Cdiff, stool culture negative. Giardia negative. July 2020 outside labs with BUN 14, Cr 0.95, Normal LFTs, TSH 6.33: meds adjusted.   Past Medical History:  Diagnosis Date  . Anxiety   . Generalized osteoarthritis   . GERD (gastroesophageal reflux disease)   . Hyperlipidemia   . Hypertension   . Hypothyroidism   .  Insomnia     Past Surgical History:  Procedure Laterality Date  . COLONOSCOPY  10/2014   Dr. Cristine Polio, outside hospital. Colonoscopy performed with Propofol, no polyps, small to medium size internal hemorrhoids noted.   [name] ECTOPIC PREGNANCY SURGERY    . ELBOW FRACTURE SURGERY    . HIATAL HERNIA REPAIR    . JOINT REPLACEMENT     bilateral knee  . TONSILLECTOMY      Current Outpatient Medications  Medication Sig Dispense Refill  . apixaban (ELIQUIS) 5 MG TABS tablet Take 5 mg by mouth 2 (two) times daily.    . cholecalciferol (VITAMIN D) 1000 units tablet Take 5,000 Units by mouth daily.     [name] escitalopram (LEXAPRO) 10 MG tablet Take 5 mg by mouth every evening.     [name] esomeprazole (NEXIUM) 40 MG capsule TAKE 1 CAPSULE(40 MG) BY MOUTH TWICE DAILY BEFORE A MEAL (Patient taking differently: Taking once a day) 180 capsule 0  . NP THYROID PO Take by mouth. 0.5mg  daily    . progesterone (PROMETRIUM) 200 MG capsule TK 2 CS PO QHS  3  . dicyclomine (BENTYL) 10 MG capsule Take 1 capsule (10 mg total) by mouth 4 (four) times daily -  before meals and at bedtime. 120 capsule 3   No current facility-administered medications for this visit.     Allergies as of 03/28/2019 - Review Complete 03/28/2019  Allergen Reaction Noted  . Dilantin [phenytoin sodium extended] Other (See Comments) 03/28/2019  .  Doxycycline hyclate  12/27/2018    Family History  Problem Relation Age of Onset  . Colon cancer Neg Hx     Social History   Socioeconomic History  . Marital status: Widowed    Spouse name: Not on file  . Number of children: Not on file  . Years of education: Not on file  . Highest education level: Not on file  Occupational History  . Not on file  Social Needs  . Financial resource strain: Not on file  . Food insecurity    Worry: Not on file    Inability: Not on file  . Transportation needs    Medical: Not on file    Non-medical: Not on file  Tobacco Use  . Smoking status: Never  Smoker  . Smokeless tobacco: Never Used  Substance and Sexual Activity  . Alcohol use: No    Alcohol/week: 0.0 standard drinks  . Drug use: No  . Sexual activity: Not on file  Lifestyle  . Physical activity    Days per week: Not on file    Minutes per session: Not on file  . Stress: Not on file  Relationships  . Social Herbalist on phone: Not on file    Gets together: Not on file    Attends religious service: Not on file    Active member of club or organization: Not on file    Attends meetings of clubs or organizations: Not on file    Relationship status: Not on file  Other Topics Concern  . Not on file  Social History Narrative  . Not on file    Review of Systems: Gen: see HPI CV: Denies chest pain, palpitations, syncope, peripheral edema, and claudication. Resp: Denies dyspnea at rest, cough, wheezing, coughing up blood, and pleurisy. GI: see HPI Derm: Denies rash, itching, dry skin Psych: Denies depression, anxiety, memory loss, confusion. No homicidal or suicidal ideation.  Heme: Denies bruising, bleeding, and enlarged lymph nodes.  Physical Exam: BP 125/78   Pulse 62   Temp (!) 97.1 F (36.2 C) (Temporal)   Ht 5\' 1"  (1.549 m)   Wt 195 lb (88.5 kg)   BMI 36.84 kg/m  General:   Alert and oriented. No distress noted. Pleasant and cooperative.  Head:  Normocephalic and atraumatic. Eyes:  Conjuctiva clear without scleral icterus. Lungs: clear bilaterally Cardiac: S1 S2 present without murmurs  Abdomen:  +BS, soft, non-tender and non-distended. No rebound or guarding. No HSM or masses noted. Msk:  Symmetrical without gross deformities. Normal posture. Extremities:  Without edema. Neurologic:  Alert and  oriented x4 Psych:  Alert and cooperative. Normal mood and affect.

## 2019-03-29 NOTE — Assessment & Plan Note (Addendum)
73 year old female with several month history of diarrhea, associated weight loss, negative stool studies and CT. Labs unrevealing except for elevated TSH, which is being addressed. Thus far, imodium has been most helpful but still with urgency, nocturnal symptoms. Needs diagnostic colonoscopy with random colonic biopsies in near future. She has an upcoming vascular procedure and on Eliquis due to recent superficial thrombosis of lower extremity. Will await that procedure, which is in 2 weeks, and then determine anticoagulation status. Risks and benefits discussed in detail with stated understanding. In interim, start Bentyl. Consider trial of Xifaxan.

## 2019-03-29 NOTE — Assessment & Plan Note (Signed)
Solid food dysphagia. Will arrange EGD/dilatation after clear from vascular standpoint.

## 2019-04-02 ENCOUNTER — Ambulatory Visit: Payer: Medicare Other | Admitting: Orthopaedic Surgery

## 2019-04-02 NOTE — Progress Notes (Signed)
cc'ed to pcp °

## 2019-04-04 ENCOUNTER — Ambulatory Visit (INDEPENDENT_AMBULATORY_CARE_PROVIDER_SITE_OTHER): Payer: Medicare Other | Admitting: Orthopaedic Surgery

## 2019-04-04 ENCOUNTER — Ambulatory Visit: Payer: Medicare Other

## 2019-04-04 ENCOUNTER — Other Ambulatory Visit: Payer: Self-pay

## 2019-04-04 ENCOUNTER — Encounter: Payer: Self-pay | Admitting: Orthopaedic Surgery

## 2019-04-04 VITALS — BP 123/75 | HR 71 | Temp 98.2°F | Ht 61.0 in | Wt 190.0 lb

## 2019-04-04 DIAGNOSIS — M79674 Pain in right toe(s): Secondary | ICD-10-CM

## 2019-04-04 DIAGNOSIS — E039 Hypothyroidism, unspecified: Secondary | ICD-10-CM | POA: Diagnosis not present

## 2019-04-04 MED ORDER — HYDROCODONE-ACETAMINOPHEN 5-325 MG PO TABS: ORAL_TABLET | ORAL | 0 refills | Status: DC

## 2019-04-04 NOTE — Progress Notes (Signed)
Patient TO:Annette Saunders, female DOB:[birth year], 73 y.o. [id number]  Chief Complaint  Patient presents with  . Foot Pain    right     HPI  Annette Saunders is a 73 y.o. female who has painful right foot.  She was in an accident last year and hurt her right foot.  She has pain on the dorsum. She has no new trauma, no redness. She has no numbness.  It hurts when she wears a shoe.  She was found to have a clot in the left leg last month and is wearing surgical stockings thigh high on the left. She is to have a procedure on the left next week.   Body mass index is 35.9 kg/m.  ROS  Review of Systems  Constitutional: Positive for activity change.       Patient does not have diabetes Patient has hypertension Patient does not have COPD Patient does not smoke.  HENT: Negative for congestion.   Respiratory: Negative for cough and shortness of breath.   Cardiovascular: Negative for chest pain.  Musculoskeletal: Positive for back pain, gait problem, myalgias and neck pain.  Allergic/Immunologic: Negative for environmental allergies.  Psychiatric/Behavioral: The patient is nervous/anxious.   All other systems reviewed and are negative.   All other systems reviewed and are negative.  The following is a summary of the past history medically, past history surgically, known current medicines, social history and family history.  This information is gathered electronically by the computer from prior information and documentation.  I review this each visit and have found including this information at this point in the chart is beneficial and informative.    Past Medical History:  Diagnosis Date  . Anxiety   . Generalized osteoarthritis   . GERD (gastroesophageal reflux disease)   . Hyperlipidemia   . Hypertension   . Hypothyroidism   . Insomnia     Past Surgical History:  Procedure Laterality Date  . COLONOSCOPY  10/2014   Dr. Cristine Polio, outside hospital. Colonoscopy performed with  Propofol, no polyps, small to medium size internal hemorrhoids noted.   [name] ECTOPIC PREGNANCY SURGERY    . ELBOW FRACTURE SURGERY    . HIATAL HERNIA REPAIR    . JOINT REPLACEMENT     bilateral knee  . TONSILLECTOMY      Family History  Problem Relation Age of Onset  . Colon cancer Neg Hx     Social History Social History   Tobacco Use  . Smoking status: Never Smoker  . Smokeless tobacco: Never Used  Substance Use Topics  . Alcohol use: No    Alcohol/week: 0.0 standard drinks  . Drug use: No    Allergies  Allergen Reactions  . Dilantin [Phenytoin Sodium Extended] Other (See Comments)    Hallucinations  . Doxycycline Hyclate     Blurry vision     Current Outpatient Medications  Medication Sig Dispense Refill  . apixaban (ELIQUIS) 5 MG TABS tablet Take 5 mg by mouth 2 (two) times daily.    . cholecalciferol (VITAMIN D) 1000 units tablet Take 5,000 Units by mouth daily.     [name] dicyclomine (BENTYL) 10 MG capsule Take 1 capsule (10 mg total) by mouth 4 (four) times daily -  before meals and at bedtime. 120 capsule 3  . escitalopram (LEXAPRO) 10 MG tablet Take 5 mg by mouth every evening.     [name] esomeprazole (NEXIUM) 40 MG capsule TAKE 1 CAPSULE(40 MG) BY MOUTH TWICE DAILY BEFORE A MEAL (Patient taking differently:  Taking once a day) 180 capsule 0  . NP THYROID PO Take by mouth. 0.5mg  daily    . progesterone (PROMETRIUM) 200 MG capsule TK 2 CS PO QHS  3  . amLODipine (NORVASC) 5 MG tablet TK 1 T PO D    . HYDROcodone-acetaminophen (NORCO/VICODIN) 5-325 MG tablet One tablet every four hours as needed for acute pain.  Limit of five days per [place] statue. 30 tablet 0   No current facility-administered medications for this visit.      Physical Exam  Blood pressure 123/75, pulse 71, temperature 98.2 F (36.8 C), height 5\' 1"  (1.549 m), weight 190 lb (86.2 kg).  Constitutional: overall normal hygiene, normal nutrition, well developed, normal grooming, normal body  habitus. Assistive device:none  Musculoskeletal: gait and station Limp none, muscle tone and strength are normal, no tremors or atrophy is present.  .  Neurological: coordination overall normal.  Deep tendon reflex/nerve stretch intact.  Sensation normal.  Cranial nerves II-XII intact.   Skin:   Normal overall no scars, lesions, ulcers or rashes. No psoriasis.  Psychiatric: Alert and oriented x 3.  Recent memory intact, remote memory unclear.  Normal mood and affect. Well groomed.  Good eye contact.  Cardiovascular: overall no swelling, no varicosities, no edema bilaterally, normal temperatures of the legs and arms, no clubbing, cyanosis and good capillary refill.  Lymphatic: palpation is normal.  All other systems reviewed and are negative   The patient has been educated about the nature of the problem(s) and counseled on treatment options.  The patient appeared to understand what I have discussed and is in agreement with it.  X-rays were done of the right foot, reported separately.  Encounter Diagnosis  Name Primary?  . Pain of right great toe Yes    PLAN Call if any problems.  Precautions discussed.  Continue current medications.   Return to clinic as needed.   I told her to use the BioFreeze or Aspercreme to the foot.  I will see her as needed.  Electronically Signed Sanjuana Kava, MD 8/6/202011:44 AM

## 2019-04-17 DIAGNOSIS — I8312 Varicose veins of left lower extremity with inflammation: Secondary | ICD-10-CM | POA: Diagnosis not present

## 2019-04-22 DIAGNOSIS — I8312 Varicose veins of left lower extremity with inflammation: Secondary | ICD-10-CM | POA: Diagnosis not present

## 2019-05-07 ENCOUNTER — Other Ambulatory Visit (INDEPENDENT_AMBULATORY_CARE_PROVIDER_SITE_OTHER): Payer: Self-pay | Admitting: Internal Medicine

## 2019-05-07 MED ORDER — AMLODIPINE BESYLATE 5 MG PO TABS: 5.0000 mg | ORAL_TABLET | Freq: Every day | ORAL | 0 refills | Status: DC

## 2019-05-09 ENCOUNTER — Ambulatory Visit: Payer: Medicare Other | Admitting: Orthopaedic Surgery

## 2019-05-14 ENCOUNTER — Encounter: Payer: Self-pay | Admitting: Orthopaedic Surgery

## 2019-05-14 ENCOUNTER — Ambulatory Visit (INDEPENDENT_AMBULATORY_CARE_PROVIDER_SITE_OTHER): Payer: Medicare Other | Admitting: Orthopaedic Surgery

## 2019-05-14 ENCOUNTER — Other Ambulatory Visit: Payer: Self-pay

## 2019-05-14 VITALS — BP 150/93 | HR 69 | Ht 61.0 in | Wt 190.0 lb

## 2019-05-14 DIAGNOSIS — M79671 Pain in right foot: Secondary | ICD-10-CM

## 2019-05-14 DIAGNOSIS — G8929 Other chronic pain: Secondary | ICD-10-CM

## 2019-05-14 NOTE — Progress Notes (Signed)
Patient [id number] Besselman, female DOB:1946-08-26, 73 y.o. [id number]  Chief Complaint  Patient presents with  . Foot Pain    right/ has been one year since injury     HPI  Annette Saunders is a 73 y.o. female who has continued pain of the right foot and great toe area.  She has some swelling but no redness.  She has pain mainly at night and after walking a while.  She has no redness or numbness.  She says the pain is intense at times.  I will get MRI of the right foot as she is not getting any better.   Body mass index is 35.9 kg/m.  ROS  Review of Systems  Constitutional: Positive for activity change.       Patient does not have diabetes Patient has hypertension Patient does not have COPD Patient does not smoke.  HENT: Negative for congestion.   Respiratory: Negative for cough and shortness of breath.   Cardiovascular: Negative for chest pain.  Musculoskeletal: Positive for back pain, gait problem, myalgias and neck pain.  Allergic/Immunologic: Negative for environmental allergies.  Psychiatric/Behavioral: The patient is nervous/anxious.   All other systems reviewed and are negative.   All other systems reviewed and are negative.  The following is a summary of the past history medically, past history surgically, known current medicines, social history and family history.  This information is gathered electronically by the computer from prior information and documentation.  I review this each visit and have found including this information at this point in the chart is beneficial and informative.    Past Medical History:  Diagnosis Date  . Anxiety   . Generalized osteoarthritis   . GERD (gastroesophageal reflux disease)   . Hyperlipidemia   . Hypertension   . Hypothyroidism   . Insomnia     Past Surgical History:  Procedure Laterality Date  . COLONOSCOPY  10/2014   Dr. Cristine Polio, outside hospital. Colonoscopy performed with Propofol, no polyps, small to medium size  internal hemorrhoids noted.   [name] ECTOPIC PREGNANCY SURGERY    . ELBOW FRACTURE SURGERY    . HIATAL HERNIA REPAIR    . JOINT REPLACEMENT     bilateral knee  . TONSILLECTOMY      Family History  Problem Relation Age of Onset  . Colon cancer Neg Hx     Social History Social History   Tobacco Use  . Smoking status: Never Smoker  . Smokeless tobacco: Never Used  Substance Use Topics  . Alcohol use: No    Alcohol/week: 0.0 standard drinks  . Drug use: No    Allergies  Allergen Reactions  . Dilantin [Phenytoin Sodium Extended] Other (See Comments)    Hallucinations  . Doxycycline Hyclate     Blurry vision     Current Outpatient Medications  Medication Sig Dispense Refill  . amLODipine (NORVASC) 5 MG tablet Take 1 tablet (5 mg total) by mouth daily. 90 tablet 0  . apixaban (ELIQUIS) 5 MG TABS tablet Take 5 mg by mouth 2 (two) times daily.    . cholecalciferol (VITAMIN D) 1000 units tablet Take 5,000 Units by mouth daily.     [name] dicyclomine (BENTYL) 10 MG capsule Take 1 capsule (10 mg total) by mouth 4 (four) times daily -  before meals and at bedtime. 120 capsule 3  . escitalopram (LEXAPRO) 10 MG tablet Take 5 mg by mouth every evening.     [name] esomeprazole (NEXIUM) 40 MG capsule TAKE 1 CAPSULE(40 MG)  BY MOUTH TWICE DAILY BEFORE A MEAL (Patient taking differently: Taking once a day) 180 capsule 0  . HYDROcodone-acetaminophen (NORCO/VICODIN) 5-325 MG tablet One tablet every four hours as needed for acute pain.  Limit of five days per [place] statue. 30 tablet 0  . NP THYROID PO Take by mouth. 0.5mg  daily    . progesterone (PROMETRIUM) 200 MG capsule TK 2 CS PO QHS  3   No current facility-administered medications for this visit.      Physical Exam  Blood pressure (!) 150/93, pulse 69, height 5\' 1"  (1.549 m), weight 190 lb (86.2 kg).  Constitutional: overall normal hygiene, normal nutrition, well developed, normal grooming, normal body habitus. Assistive  device:none  Musculoskeletal: gait and station Limp right, muscle tone and strength are normal, no tremors or atrophy is present.  .  Neurological: coordination overall normal.  Deep tendon reflex/nerve stretch intact.  Sensation normal.  Cranial nerves II-XII intact.   Skin:   Normal overall no scars, lesions, ulcers or rashes. No psoriasis.  Psychiatric: Alert and oriented x 3.  Recent memory intact, remote memory unclear.  Normal mood and affect. Well groomed.  Good eye contact.  Cardiovascular: overall no swelling, no varicosities, no edema bilaterally, normal temperatures of the legs and arms, no clubbing, cyanosis and good capillary refill.  Lymphatic: palpation is normal.  Right foot is tender dorsally of the mid foot to the great toe.  She has slight swelling.  She has normal sensation.   All other systems reviewed and are negative   The patient has been educated about the nature of the problem(s) and counseled on treatment options.  The patient appeared to understand what I have discussed and is in agreement with it.  Encounter Diagnosis  Name Primary?  . Chronic foot pain, right Yes    PLAN Call if any problems.  Precautions discussed.  Continue current medications.   Return to clinic Get MRi of the foot   Electronically Signed Sanjuana Kava, MD 9/15/202011:55 AM

## 2019-05-15 DIAGNOSIS — I8312 Varicose veins of left lower extremity with inflammation: Secondary | ICD-10-CM | POA: Diagnosis not present

## 2019-05-15 DIAGNOSIS — I83892 Varicose veins of left lower extremities with other complications: Secondary | ICD-10-CM | POA: Diagnosis not present

## 2019-05-20 ENCOUNTER — Ambulatory Visit (HOSPITAL_COMMUNITY): Payer: Medicare Other

## 2019-05-20 ENCOUNTER — Other Ambulatory Visit (INDEPENDENT_AMBULATORY_CARE_PROVIDER_SITE_OTHER): Payer: Self-pay | Admitting: Nurse Practitioner

## 2019-05-23 ENCOUNTER — Other Ambulatory Visit: Payer: Self-pay

## 2019-05-23 ENCOUNTER — Ambulatory Visit (HOSPITAL_COMMUNITY)
Admission: RE | Admit: 2019-05-23 | Discharge: 2019-05-23 | Disposition: A | Discharge status: Home / Self Care | Payer: Medicare Other | Source: Ambulatory Visit | Attending: Orthopaedic Surgery | Admitting: Orthopaedic Surgery

## 2019-05-23 DIAGNOSIS — G8929 Other chronic pain: Secondary | ICD-10-CM | POA: Diagnosis not present

## 2019-05-23 DIAGNOSIS — M19071 Primary osteoarthritis, right ankle and foot: Secondary | ICD-10-CM | POA: Diagnosis not present

## 2019-05-23 DIAGNOSIS — M79671 Pain in right foot: Secondary | ICD-10-CM | POA: Insufficient documentation

## 2019-05-23 DIAGNOSIS — Z23 Encounter for immunization: Secondary | ICD-10-CM | POA: Diagnosis not present

## 2019-05-29 DIAGNOSIS — I8312 Varicose veins of left lower extremity with inflammation: Secondary | ICD-10-CM | POA: Diagnosis not present

## 2019-05-29 DIAGNOSIS — I83812 Varicose veins of left lower extremities with pain: Secondary | ICD-10-CM | POA: Diagnosis not present

## 2019-05-30 ENCOUNTER — Other Ambulatory Visit: Payer: Self-pay

## 2019-05-30 ENCOUNTER — Encounter: Payer: Self-pay | Admitting: Gastroenterology

## 2019-05-30 ENCOUNTER — Ambulatory Visit (INDEPENDENT_AMBULATORY_CARE_PROVIDER_SITE_OTHER): Payer: Medicare Other | Admitting: Gastroenterology

## 2019-05-30 ENCOUNTER — Ambulatory Visit (INDEPENDENT_AMBULATORY_CARE_PROVIDER_SITE_OTHER): Payer: Medicare Other | Admitting: Internal Medicine

## 2019-05-30 VITALS — BP 126/82 | HR 89 | Temp 97.0°F | Ht 61.0 in | Wt 198.0 lb

## 2019-05-30 DIAGNOSIS — R197 Diarrhea, unspecified: Secondary | ICD-10-CM | POA: Diagnosis not present

## 2019-05-30 DIAGNOSIS — R1310 Dysphagia, unspecified: Secondary | ICD-10-CM

## 2019-05-30 DIAGNOSIS — K2100 Gastro-esophageal reflux disease with esophagitis, without bleeding: Secondary | ICD-10-CM

## 2019-05-30 MED ORDER — PANTOPRAZOLE SODIUM 40 MG PO TBEC: 40.0000 mg | DELAYED_RELEASE_TABLET | Freq: Two times a day (BID) | ORAL | 3 refills | Status: DC

## 2019-05-30 NOTE — Progress Notes (Signed)
Referring Provider: Doree Albee, MD Primary Care Physician:  Doree Albee, MD Primary GI: Dr. Oneida Alar   Chief Complaint  Patient presents with  . Gastroesophageal Reflux    c/o feeling full all the time and nausea    HPI:   Annette Saunders is a 73 y.o. female presenting today with a history of history of GERD, history of large hiatal hernia repair a few years ago by Dr. Duke Salvia, but we have had difficulty obtaining records. Reportedly had an upper endoscopy by Dr. Cristine Polio several years ago, but this was unable to be retrieved as well. Colonoscopy on file from 2016.BPE April 2019 with prior fundoplication without recurrent hiatal hernia, otherwise normal.   She has been having difficulty with dysphagia over the past few months. Taking Nexium BID. Still nauseated. Has only tried Nexium in the past. Will get indigestion intermittently. Early satiety. Eats broccoli and chicken. Solid food dysphagia. Has to push on her right side of neck at times to get food down. Eats small pieces.   At last visit she noted looser stool but had negative stool studies. CT was unrevealing. Started on Bentyl BID with great improvement. Believes food triggers symptoms. Lactose-free helps with symptoms.   Superficial left lower extremity blood clot over the summer. Procedure completed by  by Renaldo Reel in [facility] April 17, 2019 in the office. Follow-up Apr 19, 2019. States she will be on Eliquis for another month possibly. He will be examining her left leg Oct 21st.   Past Medical History:  Diagnosis Date  . Anxiety   . Generalized osteoarthritis   . GERD (gastroesophageal reflux disease)   . Hyperlipidemia   . Hypertension   . Hypothyroidism   . Insomnia     Past Surgical History:  Procedure Laterality Date  . COLONOSCOPY  10/2014   Dr. Cristine Polio, outside hospital. Colonoscopy performed with Propofol, no polyps, small to medium size internal hemorrhoids noted.   [name] ECTOPIC PREGNANCY SURGERY     . ELBOW FRACTURE SURGERY    . HIATAL HERNIA REPAIR    . JOINT REPLACEMENT     bilateral knee  . TONSILLECTOMY      Current Outpatient Medications  Medication Sig Dispense Refill  . apixaban (ELIQUIS) 5 MG TABS tablet Take 5 mg by mouth 2 (two) times daily.    . cholecalciferol (VITAMIN D) 1000 units tablet Take 5,000 Units by mouth daily.     [name] dicyclomine (BENTYL) 10 MG capsule Take 1 capsule (10 mg total) by mouth 4 (four) times daily -  before meals and at bedtime. (Patient taking differently: Take 10 mg by mouth 2 (two) times daily. ) 120 capsule 3  . escitalopram (LEXAPRO) 5 MG tablet TAKE 1 TABLET BY MOUTH DAILY 90 tablet 0  . esomeprazole (NEXIUM) 40 MG capsule TAKE 1 CAPSULE(40 MG) BY MOUTH TWICE DAILY BEFORE A MEAL (Patient taking differently: Taking once a day) 180 capsule 0  . NP THYROID PO Take by mouth. 0.5mg  daily    . progesterone (PROMETRIUM) 200 MG capsule TK 2 CS PO QHS  3  . pantoprazole (PROTONIX) 40 MG tablet Take 1 tablet (40 mg total) by mouth 2 (two) times daily before a meal. 30 minutes before meals 60 tablet 3   No current facility-administered medications for this visit.     Allergies as of 05/30/2019 - Review Complete 05/30/2019  Allergen Reaction Noted  . Dilantin [phenytoin sodium extended] Other (See Comments) 03/28/2019  . Doxycycline hyclate  12/27/2018  Family History  Problem Relation Age of Onset  . Colon cancer Neg Hx     Social History   Socioeconomic History  . Marital status: Widowed    Spouse name: Not on file  . Number of children: Not on file  . Years of education: Not on file  . Highest education level: Not on file  Occupational History  . Not on file  Social Needs  . Financial resource strain: Not on file  . Food insecurity    Worry: Not on file    Inability: Not on file  . Transportation needs    Medical: Not on file    Non-medical: Not on file  Tobacco Use  . Smoking status: Never Smoker  . Smokeless tobacco: Never  Used  Substance and Sexual Activity  . Alcohol use: No    Alcohol/week: 0.0 standard drinks  . Drug use: No  . Sexual activity: Not on file  Lifestyle  . Physical activity    Days per week: Not on file    Minutes per session: Not on file  . Stress: Not on file  Relationships  . Social Herbalist on phone: Not on file    Gets together: Not on file    Attends religious service: Not on file    Active member of club or organization: Not on file    Attends meetings of clubs or organizations: Not on file    Relationship status: Not on file  Other Topics Concern  . Not on file  Social History Narrative  . Not on file    Review of Systems: Gen: Denies fever, chills, anorexia. Denies fatigue, weakness, weight loss.  CV: Denies chest pain, palpitations, syncope, peripheral edema, and claudication. Resp: Denies dyspnea at rest, cough, wheezing, coughing up blood, and pleurisy. GI: see HPI Derm: Denies rash, itching, dry skin Psych: Denies depression, anxiety, memory loss, confusion. No homicidal or suicidal ideation.  Heme: Denies bruising, bleeding, and enlarged lymph nodes.  Physical Exam: BP 126/82   Pulse 89   Temp (!) 97 F (36.1 C) (Oral)   Ht 5\' 1"  (1.549 m)   Wt 198 lb (89.8 kg)   BMI 37.41 kg/m  General:   Alert and oriented. No distress noted. Pleasant and cooperative.  Head:  Normocephalic and atraumatic. Eyes:  Conjuctiva clear without scleral icterus. Lungs: clear bilaterally Cardiac: S1 S2 present without murmurs Abdomen:  +BS, soft, non-tender and non-distended. No rebound or guarding. No HSM or masses noted. Msk:  Symmetrical without gross deformities. Normal posture. Extremities:  Compression stockings to LLE and RLE Neurologic:  Alert and  oriented x4 Psych:  Alert and cooperative. Normal mood and affect.

## 2019-05-30 NOTE — Assessment & Plan Note (Signed)
Much improved with avoidance of lactose, dietary changes, and Bentyl BID. We discussed tapering off of this and only using prn. Colonoscopy in 2016 on file, next in 10 years unless clinically changes.

## 2019-05-30 NOTE — Assessment & Plan Note (Signed)
Not ideally controlled. Stop Nexium, as she has been on this chronically. Trial of Protonix BID sent to pharmacy.

## 2019-05-30 NOTE — Patient Instructions (Signed)
Stop taking Nexium. Start taking Protonix on an empty stomach twice a day, at least 30 minutes before a meal. I sent this to your pharmacy!  Let's wean down the Bentyl. Take it once a day, and if you do well, you can stop it. You can continue twice a day if needed.   We have tentatively scheduled for an upper endoscopy and dilation with Dr. Oneida Alar in the future. Make sure to call me after your October appointment so we know about the blood thinner!  Will see you back in January!  I enjoyed seeing you again today! As you know, I value our relationship and want to provide genuine, compassionate, and quality care. I welcome your feedback. If you receive a survey regarding your visit,  I greatly appreciate you taking time to fill this out. See you next time!  Annitta Needs, PhD, ANP-BC [facility]

## 2019-05-30 NOTE — Assessment & Plan Note (Addendum)
73 year old female with history of chronic GERD, large hiatal hernia repair in past with BPE April 2019 noting no recurrence and otherwise normal. Last EGD several years ago by Dr. Cristine Polio, but records have not been able to be retrieved. With early satiety, intermittent nausea, and dysphagia, EGD/dilation is recommended. She is on Eliquis now due to superficial blood clot lower extremity over the summer but reports she may be coming off of this soon; she will call after appt with Dr. Renaldo Reel Oct 21st. Tentatively will hold a date upcoming in the interim.  Proceed with upper endoscopy/dilation with Propofol in the near future with Dr. Oneida Alar. The risks, benefits, and alternatives have been discussed in detail with patient. They have stated understanding and desire to proceed.  Follow-up in Jan 2021

## 2019-06-06 ENCOUNTER — Ambulatory Visit (INDEPENDENT_AMBULATORY_CARE_PROVIDER_SITE_OTHER): Payer: Medicare Other | Admitting: Internal Medicine

## 2019-06-06 ENCOUNTER — Encounter (INDEPENDENT_AMBULATORY_CARE_PROVIDER_SITE_OTHER): Payer: Self-pay | Admitting: Internal Medicine

## 2019-06-06 ENCOUNTER — Other Ambulatory Visit: Payer: Self-pay

## 2019-06-06 VITALS — BP 130/70 | HR 68 | Temp 97.9°F | Resp 18 | Ht 64.0 in | Wt 193.0 lb

## 2019-06-06 DIAGNOSIS — E039 Hypothyroidism, unspecified: Secondary | ICD-10-CM

## 2019-06-06 DIAGNOSIS — E669 Obesity, unspecified: Secondary | ICD-10-CM | POA: Diagnosis not present

## 2019-06-06 DIAGNOSIS — E66811 Obesity, class 1: Secondary | ICD-10-CM | POA: Insufficient documentation

## 2019-06-06 DIAGNOSIS — K2100 Gastro-esophageal reflux disease with esophagitis, without bleeding: Secondary | ICD-10-CM | POA: Diagnosis not present

## 2019-06-06 DIAGNOSIS — Z1159 Encounter for screening for other viral diseases: Secondary | ICD-10-CM | POA: Diagnosis not present

## 2019-06-06 DIAGNOSIS — R5383 Other fatigue: Secondary | ICD-10-CM

## 2019-06-06 DIAGNOSIS — R5381 Other malaise: Secondary | ICD-10-CM | POA: Diagnosis not present

## 2019-06-06 DIAGNOSIS — I10 Essential (primary) hypertension: Secondary | ICD-10-CM

## 2019-06-06 HISTORY — DX: Essential (primary) hypertension: I10

## 2019-06-06 HISTORY — DX: Obesity, unspecified: E66.9

## 2019-06-06 HISTORY — DX: Other malaise: R53.81

## 2019-06-06 HISTORY — DX: Hypothyroidism, unspecified: E03.9

## 2019-06-06 HISTORY — DX: Other fatigue: R53.83

## 2019-06-06 NOTE — Patient Instructions (Signed)
[facility] Dietary Recommendations for Weight Loss What to Avoid . Avoid added sugars o Often added sugar can be found in processed foods such as many condiments, dry cereals, cakes, cookies, chips, crisps, crackers, candies, sweetened drinks, etc.  o Read labels and AVOID/DECREASE use of foods with the following in their ingredient list: Sugar, fructose, high fructose corn syrup, sucrose, glucose, maltose, dextrose, molasses, cane sugar, brown sugar, any type of syrup, agave nectar, etc.   . Avoid snacking in between meals . Avoid foods made with flour o If you are going to eat food made with flour, choose those made with whole-grains; and, minimize your consumption as much as is tolerable . Avoid processed foods o These foods are generally stocked in the middle of the grocery store. Focus on shopping on the perimeter of the grocery.  What to Include . Vegetables o GREEN LEAFY VEGETABLES: Kale, spinach, mustard greens, collard greens, cabbage, broccoli, etc. o OTHER: Asparagus, cauliflower, eggplant, carrots, peas, Brussel sprouts, tomatoes, bell peppers, zucchini, beets, cucumbers, etc. . Grains, seeds, and legumes o Beans: kidney beans, black eyed peas, garbanzo beans, black beans, pinto beans, etc. o Whole, unrefined grains: brown rice, barley, bulgur, oatmeal, etc. . Healthy fats  o Avoid highly processed fats such as vegetable oil o Examples of healthy fats: avocado, olives, virgin olive oil, dark chocolate (?72% Cocoa), nuts (peanuts, almonds, walnuts, cashews, pecans, etc.) . Low - Moderate Intake of Animal Sources of Protein o Meat sources: chicken, turkey, salmon, tuna. Limit to 4 ounces of meat at one time. o Consider limiting dairy sources, but when choosing dairy focus on: PLAIN Greek yogurt, cottage cheese, high-protein milk . Fruit o Choose berries  When to Eat . Intermittent Fasting: o Choosing not to eat for a specific time period, but DO FOCUS ON HYDRATION  when fasting o Multiple Techniques: - Time Restricted Eating: eat 3 meals in a day, each meal lasting no more than 60 minutes, no snacks between meals - 16-18 hour fast: fast for 16 to 18 hours up to 7 days a week. Often suggested to start with 2-3 nonconsecutive days per week.  . Remember the time you sleep is counted as fasting.  . Examples of eating schedule: Fast from 7:00pm-11:00am. Eat between 11:00am-7:00pm.  - 24-hour fast: fast for 24 hours up to every other day. Often suggested to start with 1 day per week . Remember the time you sleep is counted as fasting . Examples of eating schedule:  o Eating day: eat 2-3 meals on your eating day. If doing 2 meals, each meal should last no more than 90 minutes. If doing 3 meals, each meal should last no more than 60 minutes. Finish last meal by 7:00pm. o Fasting day: Fast until 7:00pm.  o IF YOU FEEL UNWELL FOR ANY REASON/IN ANY WAY WHEN FASTING, STOP FASTING BY EATING A NUTRITIOUS SNACK OR LIGHT MEAL o ALWAYS FOCUS ON HYDRATION DURING FASTS - Acceptable Hydration sources: water, broths, tea/coffee (black tea/coffee is best but using a small amount of whole-fat dairy products in coffee/tea is acceptable).  - Poor Hydration Sources: anything with sugar or artificial sweeteners added to it  These recommendations have been developed for patients that are actively receiving medical care from either Dr. Anasha Perfecto or Sarah Gray, DNP, NP-C at [facility]. These recommendations are developed for patients with specific medical conditions and are not meant to be distributed or used by others that are not actively receiving care from either provider listed   above at [facility]. It is not appropriate to participate in the above eating plans without proper medical supervision.   Reference: Fung, J. The obesity code. [place]: [place]; 2016.   

## 2019-06-06 NOTE — Progress Notes (Signed)
Wellness Office Visit  Subjective:  Patient ID: Annette Saunders, female    DOB: 1946/06/02  Age: 73 y.o. MRN: [id number]  CC: This lady comes in for follow-up of hypertension, hypothyroidism, hyperlipidemia, gastroesophageal reflux disease. HPI  She continues to feel very fatigued and her last thyroid function test showed extremely low T3 levels.  She has since been taking desiccated NP thyroid but still feels fatigued. She continues to take antihypertensive medications as prescribed.  She denies any chest pain, palpitations or limb weakness but she does describe dyspnea on exertion.  She has been investigated previously by cardiology and did have an echocardiogram done in December 2019 which showed normal systolic function.  She has not seen a cardiologist since December 2019. As far as her obesity is concerned, she tries very hard to try and lose weight and has maintained weight loss from previous. Past Medical History:  Diagnosis Date  . Anxiety   . Essential hypertension, benign 06/06/2019  . Generalized osteoarthritis   . GERD (gastroesophageal reflux disease)   . Hyperlipidemia   . Hypothyroidism, adult 06/06/2019  . Insomnia   . Malaise and fatigue 06/06/2019  . Obesity (BMI 30.0-34.9) 06/06/2019      Family History  Problem Relation Age of Onset  . Colon cancer Neg Hx     Social History   Social History Narrative   Widow for 29 years-husband died of MI.Lives alone.     Current Meds  Medication Sig  . amLODipine (NORVASC) 5 MG tablet Take 5 mg by mouth daily.  [name] apixaban (ELIQUIS) 5 MG TABS tablet Take 5 mg by mouth 2 (two) times daily.  . Cholecalciferol (VITAMIN D) 125 MCG (5000 UT) CAPS Take 5,000 Units by mouth daily.   [name] dicyclomine (BENTYL) 10 MG capsule Take 1 capsule (10 mg total) by mouth 4 (four) times daily -  before meals and at bedtime. (Patient taking differently: Take 10 mg by mouth 2 (two) times daily. )  . escitalopram (LEXAPRO) 5 MG tablet TAKE 1  TABLET BY MOUTH DAILY  . pantoprazole (PROTONIX) 40 MG tablet Take 1 tablet (40 mg total) by mouth 2 (two) times daily before a meal. 30 minutes before meals  . progesterone (PROMETRIUM) 200 MG capsule Take 600 mg by mouth every evening.   . thyroid (NP THYROID) 120 MG tablet Take 1 tablet by mouth daily. 0.5mg  daily      Nutrition  She does engage in intermittent fasting usually 16 to 18 hours every day. Sleep  She has uninterrupted sleep.  Exercise  No formal exercise. Bio Identical Hormones  Micronized progesterone is being used in this patient for multiple benefits based on studies including protection against uterine cancer, breast cancer, osteoporosis and heart disease. The patient has been counseled regarding side effects, benefits and modes of administration. The patient is agreeable that this therapy is an integral part of her wellness, quality of life and prevention of chronic disease.  Objective:   Today's Vitals: BP 130/70 (BP Location: Left Arm, Patient Position: Sitting, Cuff Size: Normal)   Pulse 68   Temp 97.9 F (36.6 C) (Temporal)   Resp 18   Ht 5\' 4"  (1.626 m)   Wt 193 lb (87.5 kg)   SpO2 96%   BMI 33.13 kg/m  Vitals with BMI 06/06/2019 05/30/2019 05/14/2019  Height 5\' 4"  5\' 1"  5\' 1"   Weight 193 lbs 198 lbs 190 lbs  BMI 33.11 [id number]  Systolic [id number]  Diastolic 70 82 93  Pulse 68 89 69     Physical Exam  She looks systemically well.  Blood pressure is well controlled.  She has lost another 5 pounds in the last week or so according to weights that have been taken at a previous physician's office.  She is alert and orientated without any focal neurological signs.     Assessment   1. Gastroesophageal reflux disease with esophagitis, unspecified whether hemorrhage   2. Essential hypertension, benign   3. Malaise and fatigue   4. Obesity (BMI 30.0-34.9)   5. Hypothyroidism, adult   6. Encounter for hepatitis C screening test for low risk  patient       Tests ordered Orders Placed This Encounter  Procedures  . COMPLETE METABOLIC PANEL WITH GFR  . T3, free  . TSH  . Hepatitis C antibody     Plan: 1. She will continue with PPI for her gastroesophageal reflux disease and I believe she is supposed to have some esophageal dilatations done in the future. 2. Her hypertension is well controlled and she will continue with the same medications. 3. I believe her hypothyroidism is still undertreated and we will check levels today to see if we need to adjust the dose. 4. In terms of weight loss, she will continue to focus on intermittent fasting and eating healthier.  We discussed today prolonged fasting which she can certainly try to engage in, maintaining hydration and salt intake and I discussed this at length. 5. Further recommendations will depend on blood results and she will follow-up in the next several months.     Doree Albee, MD

## 2019-06-07 LAB — COMPLETE METABOLIC PANEL WITH GFR
AG Ratio: 1.2 (calc) (ref 1.0–2.5)
ALT: 13 U/L (ref 6–29)
AST: 19 U/L (ref 10–35)
Albumin: 4.3 g/dL (ref 3.6–5.1)
Alkaline phosphatase (APISO): 69 U/L (ref 37–153)
BUN/Creatinine Ratio: 28 (calc) — ABNORMAL HIGH (ref 6–22)
BUN: 34 mg/dL — ABNORMAL HIGH (ref 7–25)
CO2: 24 mmol/L (ref 20–32)
Calcium: 10.7 mg/dL — ABNORMAL HIGH (ref 8.6–10.4)
Chloride: 102 mmol/L (ref 98–110)
Creat: 1.23 mg/dL — ABNORMAL HIGH (ref 0.60–0.93)
GFR, Est African American: 51 mL/min/{1.73_m2} — ABNORMAL LOW (ref >=60)
GFR, Est Non African American: 44 mL/min/{1.73_m2} — ABNORMAL LOW (ref >=60)
Globulin: 3.6 g/dL (calc) (ref 1.9–3.7)
Glucose, Bld: 105 mg/dL — ABNORMAL HIGH (ref 65–99)
Potassium: 4.7 mmol/L (ref 3.5–5.3)
Sodium: 140 mmol/L (ref 135–146)
Total Bilirubin: 0.5 mg/dL (ref 0.2–1.2)
Total Protein: 7.9 g/dL (ref 6.1–8.1)

## 2019-06-07 LAB — HEPATITIS C ANTIBODY
Hepatitis C Ab: NONREACTIVE
SIGNAL TO CUT-OFF: 0.01 (ref <1.00)

## 2019-06-07 LAB — TSH: TSH: 0.03 mIU/L — ABNORMAL LOW (ref 0.40–4.50)

## 2019-06-07 LAB — T3, FREE: T3, Free: 3.2 pg/mL (ref 2.3–4.2)

## 2019-06-10 ENCOUNTER — Other Ambulatory Visit (INDEPENDENT_AMBULATORY_CARE_PROVIDER_SITE_OTHER): Payer: Self-pay | Admitting: Internal Medicine

## 2019-06-10 ENCOUNTER — Telehealth (INDEPENDENT_AMBULATORY_CARE_PROVIDER_SITE_OTHER): Payer: Self-pay | Admitting: Internal Medicine

## 2019-06-10 NOTE — Telephone Encounter (Signed)
I have sent her a message through my chart addressing all this.  Thanks.

## 2019-06-11 ENCOUNTER — Other Ambulatory Visit: Payer: Self-pay

## 2019-06-11 ENCOUNTER — Encounter: Payer: Self-pay | Admitting: Orthopaedic Surgery

## 2019-06-11 ENCOUNTER — Ambulatory Visit (INDEPENDENT_AMBULATORY_CARE_PROVIDER_SITE_OTHER): Payer: Medicare Other | Admitting: Orthopaedic Surgery

## 2019-06-11 VITALS — BP 125/74 | HR 76 | Ht 64.0 in | Wt 193.0 lb

## 2019-06-11 DIAGNOSIS — M79671 Pain in right foot: Secondary | ICD-10-CM

## 2019-06-11 DIAGNOSIS — G8929 Other chronic pain: Secondary | ICD-10-CM
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
## 2019-06-26 ENCOUNTER — Telehealth (INDEPENDENT_AMBULATORY_CARE_PROVIDER_SITE_OTHER): Payer: Self-pay | Admitting: Internal Medicine

## 2019-06-26 ENCOUNTER — Other Ambulatory Visit: Payer: Self-pay

## 2019-06-26 ENCOUNTER — Other Ambulatory Visit (INDEPENDENT_AMBULATORY_CARE_PROVIDER_SITE_OTHER): Payer: Self-pay | Admitting: Internal Medicine

## 2019-06-26 ENCOUNTER — Telehealth: Payer: Self-pay

## 2019-06-26 ENCOUNTER — Ambulatory Visit (INDEPENDENT_AMBULATORY_CARE_PROVIDER_SITE_OTHER): Payer: Medicare Other | Admitting: Internal Medicine

## 2019-06-26 ENCOUNTER — Encounter (INDEPENDENT_AMBULATORY_CARE_PROVIDER_SITE_OTHER): Payer: Self-pay | Admitting: Internal Medicine

## 2019-06-26 VITALS — BP 130/80 | HR 80 | Ht 62.0 in | Wt 203.0 lb

## 2019-06-26 DIAGNOSIS — K3183 Achlorhydria: Secondary | ICD-10-CM

## 2019-06-26 DIAGNOSIS — R5383 Other fatigue: Secondary | ICD-10-CM | POA: Diagnosis not present

## 2019-06-26 DIAGNOSIS — R5381 Other malaise: Secondary | ICD-10-CM

## 2019-06-26 DIAGNOSIS — I10 Essential (primary) hypertension: Secondary | ICD-10-CM

## 2019-06-26 DIAGNOSIS — E669 Obesity, unspecified: Secondary | ICD-10-CM | POA: Diagnosis not present

## 2019-06-26 DIAGNOSIS — E039 Hypothyroidism, unspecified: Secondary | ICD-10-CM

## 2019-06-26 MED ORDER — THYROID 60 MG PO TABS: 60.0000 mg | ORAL_TABLET | Freq: Every day | ORAL | 3 refills | Status: DC

## 2019-06-26 NOTE — Patient Instructions (Signed)

## 2019-06-26 NOTE — Telephone Encounter (Signed)
Please call patient back and let her know that I have sent a new prescription of NP thyroid 60 mg tablet, take 1 at lunchtime between 12 noon and 2 PM. This is an extra dose in addition to the NP thyroid 120 mg tablet, she takes 1 every morning. Follow-up as scheduled.

## 2019-06-26 NOTE — Telephone Encounter (Signed)
We were holding spot for EGD/-/+DIL w/Propofol w/SLF 07/15/19. Called pt to see if she is still taking Eliquis. She is still on Eliquis and will be having a procedure next week. She wants to wait until next year for EGD. Has OV 09/05/19 w/AB.  Routing to AB as FYI.

## 2019-06-26 NOTE — Telephone Encounter (Signed)
Pt called back to advise she has 180 of Thyroid mp 2gr 120mg 

## 2019-06-26 NOTE — Progress Notes (Signed)
Metrics: Intervention Frequency ACO  Documented Smoking Status Yearly  Screened one or more times in 24 months  Cessation Counseling or  Active cessation medication Past 24 months  Past 24 months   Guideline developer: UpToDate (See UpToDate for funding source) Date Released: 2014       Wellness Office Visit  Subjective:  Patient ID: Annette Saunders, female    DOB: May 21, 1946  Age: 73 y.o. MRN: [id number]  CC: This lady comes in because she is constantly feeling tired and is gaining weight. HPI  She has gained about 10 pounds since the last time she was seen.  I had recommended that she discontinue amlodipine because her blood pressure was becoming too low and she was feeling very lightheaded.  Since stopping amlodipine, she feels better but continues to feel tired.  The last time we did blood work, her renal function was abnormal. In terms of the weight gain, she says that she is hungry all the time and wonders why.  She is eating unhealthy again.  She is not really doing any intermittent fasting like she used to. She is on a PPI for gastroesophageal reflux disease and she is concerned about her B12 levels which I agree that they might be lowered with the use of PPI. Past Medical History:  Diagnosis Date  . Anxiety   . Essential hypertension, benign 06/06/2019  . Generalized osteoarthritis   . GERD (gastroesophageal reflux disease)   . Hyperlipidemia   . Hypothyroidism, adult 06/06/2019  . Insomnia   . Malaise and fatigue 06/06/2019  . Obesity (BMI 30.0-34.9) 06/06/2019      Family History  Problem Relation Age of Onset  . Colon cancer Neg Hx     Social History   Social History Narrative   Widow for 16 years-husband died of MI.Lives alone.   Social History   Tobacco Use  . Smoking status: Never Smoker  . Smokeless tobacco: Never Used  Substance Use Topics  . Alcohol use: No    Alcohol/week: 0.0 standard drinks    Current Meds  Medication Sig  . apixaban (ELIQUIS)  5 MG TABS tablet Take 5 mg by mouth 2 (two) times daily.  . Cholecalciferol (VITAMIN D) 125 MCG (5000 UT) CAPS Take 5,000 Units by mouth daily.   [name] dicyclomine (BENTYL) 10 MG capsule Take 1 capsule (10 mg total) by mouth 4 (four) times daily -  before meals and at bedtime. (Patient taking differently: Take 10 mg by mouth 2 (two) times daily. )  . escitalopram (LEXAPRO) 5 MG tablet TAKE 1 TABLET BY MOUTH DAILY  . pantoprazole (PROTONIX) 40 MG tablet Take 1 tablet (40 mg total) by mouth 2 (two) times daily before a meal. 30 minutes before meals  . progesterone (PROMETRIUM) 200 MG capsule TAKE 3 CAPSULES BY MOUTH EVERY NIGHT  . thyroid (NP THYROID) 120 MG tablet Take 1 tablet by mouth daily.   . [DISCONTINUED] amLODipine (NORVASC) 5 MG tablet Take 5 mg by mouth daily.       Objective:   Today's Vitals: BP 130/80   Pulse 80   Ht 5\' 2"  (1.575 m)   Wt 203 lb (92.1 kg)   BMI 37.13 kg/m  Vitals with BMI 06/26/2019 06/11/2019 06/06/2019  Height 5\' 2"  5\' 4"  5\' 4"   Weight 203 lbs 193 lbs 193 lbs  BMI 37.12 [id number] [id number]  Systolic [id number]  Diastolic 80 74 70  Pulse 80 76 68     Physical Exam  She looks systemically well but she has gained 10 pounds since the last visit.  Her blood pressure, without medications, is actually normal at this time.  She is alert and orientated without any focal neurological signs.   Assessment   1. Essential hypertension, benign   2. Hypothyroidism, adult   3. Obesity (BMI 30.0-34.9)   4. Malaise and fatigue   5. Achlorhydria        Tests ordered Orders Placed This Encounter  Procedures  . COMPLETE METABOLIC PANEL WITH GFR  . B12 and Folate Panel     Plan: 1. Blood work is obtained as above. 2. I have told her to let me know what NP thyroid tablets she has at home because I think we need to increase the dose of thyroid and we can use what medications she has already at home.  She is currently taking NP thyroid 120 mg, 1 tablet in the  morning but I think I would have an additional tablet in the afternoon also.  She will let me know. 3. Further recommendations will depend on blood results and I will see her in about 6 weeks time for follow-up.  Anticipate increasing the thyroid. 4. Today I spent 25 minutes to 30 minutes face-to-face with this patient, more than 50% of the time was involved in discussing nutrition again, reevaluating her symptoms.   No orders of the defined types were placed in this encounter.   Doree Albee, MD

## 2019-06-26 NOTE — Telephone Encounter (Signed)
Noted  

## 2019-06-27 ENCOUNTER — Encounter (INDEPENDENT_AMBULATORY_CARE_PROVIDER_SITE_OTHER): Payer: Self-pay | Admitting: Internal Medicine

## 2019-06-27 LAB — B12 AND FOLATE PANEL
Folate: 13.3 ng/mL
Vitamin B-12: 341 pg/mL (ref 200–1100)

## 2019-06-27 LAB — COMPLETE METABOLIC PANEL WITH GFR
AG Ratio: 1.3 (calc) (ref 1.0–2.5)
ALT: 7 U/L (ref 6–29)
AST: 13 U/L (ref 10–35)
Albumin: 3.8 g/dL (ref 3.6–5.1)
Alkaline phosphatase (APISO): 55 U/L (ref 37–153)
BUN/Creatinine Ratio: 21 (calc) (ref 6–22)
BUN: 27 mg/dL — ABNORMAL HIGH (ref 7–25)
CO2: 23 mmol/L (ref 20–32)
Calcium: 9.7 mg/dL (ref 8.6–10.4)
Chloride: 107 mmol/L (ref 98–110)
Creat: 1.29 mg/dL — ABNORMAL HIGH (ref 0.60–0.93)
GFR, Est African American: 48 mL/min/{1.73_m2} — ABNORMAL LOW (ref >=60)
GFR, Est Non African American: 41 mL/min/{1.73_m2} — ABNORMAL LOW (ref >=60)
Globulin: 2.9 g/dL (calc) (ref 1.9–3.7)
Glucose, Bld: 111 mg/dL — ABNORMAL HIGH (ref 65–99)
Potassium: 4.2 mmol/L (ref 3.5–5.3)
Sodium: 142 mmol/L (ref 135–146)
Total Bilirubin: 0.3 mg/dL (ref 0.2–1.2)
Total Protein: 6.7 g/dL (ref 6.1–8.1)

## 2019-06-27 NOTE — Telephone Encounter (Signed)
Pt called and given new instruction on medication changes. Pt will pick up today.

## 2019-07-01 ENCOUNTER — Ambulatory Visit (INDEPENDENT_AMBULATORY_CARE_PROVIDER_SITE_OTHER): Payer: Medicare Other | Admitting: Internal Medicine

## 2019-07-09 ENCOUNTER — Ambulatory Visit (INDEPENDENT_AMBULATORY_CARE_PROVIDER_SITE_OTHER): Payer: Medicare Other | Admitting: Orthopaedic Surgery

## 2019-07-09 ENCOUNTER — Encounter: Payer: Self-pay | Admitting: Orthopaedic Surgery

## 2019-07-09 ENCOUNTER — Ambulatory Visit: Payer: Medicare Other

## 2019-07-09 ENCOUNTER — Other Ambulatory Visit: Payer: Self-pay

## 2019-07-09 VITALS — BP 140/87 | HR 93 | Ht 61.0 in | Wt 194.0 lb

## 2019-07-09 DIAGNOSIS — M25562 Pain in left knee: Secondary | ICD-10-CM

## 2019-07-09 MED ORDER — HYDROCODONE-ACETAMINOPHEN 5-325 MG PO TABS: ORAL_TABLET | ORAL | 0 refills | Status: DC

## 2019-07-09 NOTE — Progress Notes (Signed)
Patient Annette Saunders, female DOB:1946/04/04, 73 y.o. [id number]  Chief Complaint  Patient presents with  . Knee Pain    left / fall on 07/03/2019     HPI  Annette Saunders is a 73 y.o. female who fell last week and hit her left knee, more over the patella area.  She has had pain since then, more when getting up from sitting position.  She has no redness, no giving way.  She is post total knee several years ago before she moved to [place] from up [place].  She has not had any problems with it before.  She is concerned as it still hurts.     Body mass index is 36.66 kg/m.  ROS  Review of Systems  Constitutional: Positive for activity change.       Patient does not have diabetes Patient has hypertension Patient does not have COPD Patient does not smoke.  HENT: Negative for congestion.   Respiratory: Negative for cough and shortness of breath.   Cardiovascular: Negative for chest pain.  Musculoskeletal: Positive for back pain, gait problem, myalgias and neck pain.  Allergic/Immunologic: Negative for environmental allergies.  Psychiatric/Behavioral: The patient is nervous/anxious.   All other systems reviewed and are negative.   All other systems reviewed and are negative.  The following is a summary of the past history medically, past history surgically, known current medicines, social history and family history.  This information is gathered electronically by the computer from prior information and documentation.  I review this each visit and have found including this information at this point in the chart is beneficial and informative.    Past Medical History:  Diagnosis Date  . Anxiety   . Essential hypertension, benign 06/06/2019  . Generalized osteoarthritis   . GERD (gastroesophageal reflux disease)   . Hyperlipidemia   . Hypothyroidism, adult 06/06/2019  . Insomnia   . Malaise and fatigue 06/06/2019  . Obesity (BMI 30.0-34.9) 06/06/2019    Past Surgical  History:  Procedure Laterality Date  . COLONOSCOPY  10/2014   Dr. Cristine Polio, outside hospital. Colonoscopy performed with Propofol, no polyps, small to medium size internal hemorrhoids noted.   [name] ECTOPIC PREGNANCY SURGERY    . ELBOW FRACTURE SURGERY    . HIATAL HERNIA REPAIR    . JOINT REPLACEMENT     bilateral knee  . TONSILLECTOMY      Family History  Problem Relation Age of Onset  . Colon cancer Neg Hx     Social History Social History   Tobacco Use  . Smoking status: Never Smoker  . Smokeless tobacco: Never Used  Substance Use Topics  . Alcohol use: No    Alcohol/week: 0.0 standard drinks  . Drug use: No    Allergies  Allergen Reactions  . Dilantin [Phenytoin Sodium Extended] Other (See Comments)    Hallucinations  . Doxycycline Hyclate     Blurry vision     Current Outpatient Medications  Medication Sig Dispense Refill  . apixaban (ELIQUIS) 5 MG TABS tablet Take 5 mg by mouth 2 (two) times daily.    . Cholecalciferol (VITAMIN D) 125 MCG (5000 UT) CAPS Take 5,000 Units by mouth daily.     [name] dicyclomine (BENTYL) 10 MG capsule Take 1 capsule (10 mg total) by mouth 4 (four) times daily -  before meals and at bedtime. (Patient taking differently: Take 10 mg by mouth 2 (two) times daily. ) 120 capsule 3  . escitalopram (LEXAPRO) 5 MG tablet TAKE 1 TABLET  BY MOUTH DAILY 90 tablet 0  . pantoprazole (PROTONIX) 40 MG tablet Take 1 tablet (40 mg total) by mouth 2 (two) times daily before a meal. 30 minutes before meals 60 tablet 3  . progesterone (PROMETRIUM) 200 MG capsule TAKE 3 CAPSULES BY MOUTH EVERY NIGHT 90 capsule 3  . thyroid (NP THYROID) 120 MG tablet Take 1 tablet by mouth daily.     [name] thyroid (NP THYROID) 60 MG tablet Take 1 tablet (60 mg total) by mouth daily before breakfast. 30 tablet 3  . HYDROcodone-acetaminophen (NORCO/VICODIN) 5-325 MG tablet One tablet every four hours for pain. 30 tablet 0   No current facility-administered medications for this visit.       Physical Exam  Blood pressure 140/87, pulse 93, height 5\' 1"  (1.549 m), weight 194 lb (88 kg).  Constitutional: overall normal hygiene, normal nutrition, well developed, normal grooming, normal body habitus. Assistive device:none  Musculoskeletal: gait and station Limp left, muscle tone and strength are normal, no tremors or atrophy is present.  .  Neurological: coordination overall normal.  Deep tendon reflex/nerve stretch intact.  Sensation normal.  Cranial nerves II-XII intact.   Skin:   Normal overall no scars, lesions, ulcers or rashes. No psoriasis.  Psychiatric: Alert and oriented x 3.  Recent memory intact, remote memory unclear.  Normal mood and affect. Well groomed.  Good eye contact.  Cardiovascular: overall no swelling, no varicosities, no edema bilaterally, normal temperatures of the legs and arms, no clubbing, cyanosis and good capillary refill.  Lymphatic: palpation is normal.  Left knee has no effusion, well healed midline scar, ROM is full but the area around the patella is tender, no ecchymosis, patella stable, no crepitus, NV intact.  Very slight limp to the left.    All other systems reviewed and are negative   The patient has been educated about the nature of the problem(s) and counseled on treatment options.  The patient appeared to understand what I have discussed and is in agreement with it.  Encounter Diagnosis  Name Primary?  . Acute pain of left knee Yes   X-rays of the left knee were done, reported separately.  PLAN Call if any problems.  Precautions discussed.  Continue current medications.   I have told her to use ice, rest, elevation, BioFreeze to the knee.   I have given pain medicine.  I have reviewed the [facility] web site prior to prescribing narcotic medicine for this patient.    Return to clinic 2 weeks   Electronically Signed Sanjuana Kava, MD 11/10/202011:25 AM

## 2019-07-19 ENCOUNTER — Other Ambulatory Visit (INDEPENDENT_AMBULATORY_CARE_PROVIDER_SITE_OTHER): Payer: Self-pay | Admitting: Internal Medicine

## 2019-07-23 ENCOUNTER — Encounter: Payer: Self-pay | Admitting: Orthopaedic Surgery

## 2019-07-23 ENCOUNTER — Ambulatory Visit (INDEPENDENT_AMBULATORY_CARE_PROVIDER_SITE_OTHER): Payer: Medicare Other | Admitting: Orthopaedic Surgery

## 2019-07-23 ENCOUNTER — Other Ambulatory Visit: Payer: Self-pay

## 2019-07-23 VITALS — Temp 97.3°F | Ht 61.0 in | Wt 194.0 lb

## 2019-07-23 DIAGNOSIS — M25562 Pain in left knee: Secondary | ICD-10-CM

## 2019-07-23 MED ORDER — HYDROCODONE-ACETAMINOPHEN 5-325 MG PO TABS: ORAL_TABLET | ORAL | 0 refills | Status: DC

## 2019-07-23 NOTE — Progress Notes (Signed)
[name], female DOB:11/17/1945, 73 y.o. [id number]  Chief Complaint  Patient presents with  . Knee Pain    Left knee pain    HPI  Annette Saunders is a 73 y.o. female who has pain of the left knee.  She is better.  Her pain is less.  She still has tenderness but not as much as before.  She has no new trauma, no giving way.  She is active.   Body mass index is 36.66 kg/m.  ROS  Review of Systems  Constitutional: Positive for activity change.       Patient does not have diabetes Patient has hypertension Patient does not have COPD Patient does not smoke.  HENT: Negative for congestion.   Respiratory: Negative for cough and shortness of breath.   Cardiovascular: Negative for chest pain.  Musculoskeletal: Positive for back pain, gait problem, myalgias and neck pain.  Allergic/Immunologic: Negative for environmental allergies.  Psychiatric/Behavioral: The patient is nervous/anxious.   All other systems reviewed and are negative.   All other systems reviewed and are negative.  The following is a summary of the past history medically, past history surgically, known current medicines, social history and family history.  This information is gathered electronically by the computer from prior information and documentation.  I review this each visit and have found including this information at this point in the chart is beneficial and informative.    Past Medical History:  Diagnosis Date  . Anxiety   . Essential hypertension, benign 06/06/2019  . Generalized osteoarthritis   . GERD (gastroesophageal reflux disease)   . Hyperlipidemia   . Hypothyroidism, adult 06/06/2019  . Insomnia   . Malaise and fatigue 06/06/2019  . Obesity (BMI 30.0-34.9) 06/06/2019    Past Surgical History:  Procedure Laterality Date  . COLONOSCOPY  10/2014   Dr. Cristine Polio, outside hospital. Colonoscopy performed with Propofol, no polyps, small to medium size internal hemorrhoids noted.   [name]  ECTOPIC PREGNANCY SURGERY    . ELBOW FRACTURE SURGERY    . HIATAL HERNIA REPAIR    . JOINT REPLACEMENT     bilateral knee  . TONSILLECTOMY      Family History  Problem Relation Age of Onset  . Colon cancer Neg Hx     Social History Social History   Tobacco Use  . Smoking status: Never Smoker  . Smokeless tobacco: Never Used  Substance Use Topics  . Alcohol use: No    Alcohol/week: 0.0 standard drinks  . Drug use: No    Allergies  Allergen Reactions  . Dilantin [Phenytoin Sodium Extended] Other (See Comments)    Hallucinations  . Doxycycline Hyclate     Blurry vision     Current Outpatient Medications  Medication Sig Dispense Refill  . apixaban (ELIQUIS) 5 MG TABS tablet Take 5 mg by mouth 2 (two) times daily.    . Cholecalciferol (VITAMIN D) 125 MCG (5000 UT) CAPS Take 5,000 Units by mouth daily.     [name] dicyclomine (BENTYL) 10 MG capsule Take 1 capsule (10 mg total) by mouth 4 (four) times daily -  before meals and at bedtime. (Patient taking differently: Take 10 mg by mouth 2 (two) times daily. ) 120 capsule 3  . escitalopram (LEXAPRO) 10 MG tablet TAKE 1 TABLET BY MOUTH ONCE DAILY 90 tablet 0  . escitalopram (LEXAPRO) 5 MG tablet TAKE 1 TABLET BY MOUTH DAILY 90 tablet 0  . HYDROcodone-acetaminophen (NORCO/VICODIN) 5-325 MG tablet One tablet every four hours for  pain. 30 tablet 0  . pantoprazole (PROTONIX) 40 MG tablet Take 1 tablet (40 mg total) by mouth 2 (two) times daily before a meal. 30 minutes before meals 60 tablet 3  . progesterone (PROMETRIUM) 200 MG capsule TAKE 3 CAPSULES BY MOUTH EVERY NIGHT 90 capsule 3  . thyroid (NP THYROID) 120 MG tablet Take 1 tablet by mouth daily.     [name] thyroid (NP THYROID) 60 MG tablet Take 1 tablet (60 mg total) by mouth daily before breakfast. 30 tablet 3   No current facility-administered medications for this visit.      Physical Exam  Temperature (!) 97.3 F (36.3 C), height 5\' 1"  (1.549 m), weight 194 lb (88  kg).  Constitutional: overall normal hygiene, normal nutrition, well developed, normal grooming, normal body habitus. Assistive device:none  Musculoskeletal: gait and station Limp left slightly, muscle tone and strength are normal, no tremors or atrophy is present.  .  Neurological: coordination overall normal.  Deep tendon reflex/nerve stretch intact.  Sensation normal.  Cranial nerves II-XII intact.   Skin:   Normal overall no scars, lesions, ulcers or rashes. No psoriasis.  Psychiatric: Alert and oriented x 3.  Recent memory intact, remote memory unclear.  Normal mood and affect. Well groomed.  Good eye contact.  Cardiovascular: overall no swelling, no varicosities, no edema bilaterally, normal temperatures of the legs and arms, no clubbing, cyanosis and good capillary refill.  Left knee with slight effusion, crepitus, slight limp, ROM 0 to 110, stable, NV intact.  Lymphatic: palpation is normal.  All other systems reviewed and are negative   The patient has been educated about the nature of the problem(s) and counseled on treatment options.  The patient appeared to understand what I have discussed and is in agreement with it.  Encounter Diagnosis  Name Primary?  . Acute pain of left knee Yes    PLAN Call if any problems.  Precautions discussed.  Continue current medications.   Return to clinic 2 months   I have reviewed the [place] web site prior to prescribing narcotic medicine for this patient.   Electronically Signed Sanjuana Kava, MD 11/24/202010:10 AM

## 2019-08-07 ENCOUNTER — Other Ambulatory Visit: Payer: Self-pay | Admitting: Gastroenterology

## 2019-08-12 ENCOUNTER — Ambulatory Visit (INDEPENDENT_AMBULATORY_CARE_PROVIDER_SITE_OTHER): Payer: Medicare Other | Admitting: Internal Medicine

## 2019-08-14 ENCOUNTER — Telehealth (INDEPENDENT_AMBULATORY_CARE_PROVIDER_SITE_OTHER): Payer: Self-pay

## 2019-08-15 NOTE — Telephone Encounter (Signed)
Pt was given appt phone number for [phone].

## 2019-08-16 ENCOUNTER — Other Ambulatory Visit: Payer: Self-pay

## 2019-08-16 ENCOUNTER — Ambulatory Visit: Payer: Medicare Other | Attending: Internal Medicine

## 2019-08-16 DIAGNOSIS — Z20822 Contact with and (suspected) exposure to covid-19: Secondary | ICD-10-CM

## 2019-08-17 LAB — NOVEL CORONAVIRUS, NAA: SARS-CoV-2, NAA: NOT DETECTED

## 2019-08-20 ENCOUNTER — Ambulatory Visit (INDEPENDENT_AMBULATORY_CARE_PROVIDER_SITE_OTHER): Payer: Medicare Other | Admitting: Internal Medicine

## 2019-08-24 ENCOUNTER — Other Ambulatory Visit (INDEPENDENT_AMBULATORY_CARE_PROVIDER_SITE_OTHER): Payer: Self-pay | Admitting: Internal Medicine

## 2019-08-26 ENCOUNTER — Other Ambulatory Visit (INDEPENDENT_AMBULATORY_CARE_PROVIDER_SITE_OTHER): Payer: Self-pay | Admitting: Internal Medicine

## 2019-08-26 MED ORDER — THYROID 120 MG PO TABS: 120.0000 mg | ORAL_TABLET | Freq: Every day | ORAL | 3 refills | Status: DC

## 2019-09-05 ENCOUNTER — Ambulatory Visit: Payer: Medicare Other | Admitting: Gastroenterology

## 2019-09-09 ENCOUNTER — Encounter (INDEPENDENT_AMBULATORY_CARE_PROVIDER_SITE_OTHER): Payer: Self-pay | Admitting: Internal Medicine

## 2019-09-09 ENCOUNTER — Other Ambulatory Visit: Payer: Self-pay

## 2019-09-09 ENCOUNTER — Ambulatory Visit (INDEPENDENT_AMBULATORY_CARE_PROVIDER_SITE_OTHER): Payer: Medicare Other | Admitting: Internal Medicine

## 2019-09-09 VITALS — BP 130/77 | HR 89 | Temp 97.3°F | Resp 18 | Ht 65.0 in | Wt 202.5 lb

## 2019-09-09 DIAGNOSIS — R5383 Other fatigue: Secondary | ICD-10-CM

## 2019-09-09 DIAGNOSIS — E039 Hypothyroidism, unspecified: Secondary | ICD-10-CM | POA: Diagnosis not present

## 2019-09-09 DIAGNOSIS — I10 Essential (primary) hypertension: Secondary | ICD-10-CM | POA: Diagnosis not present

## 2019-09-09 DIAGNOSIS — R5381 Other malaise: Secondary | ICD-10-CM

## 2019-09-09 MED ORDER — THYROID 120 MG PO TABS: 120.0000 mg | ORAL_TABLET | Freq: Two times a day (BID) | ORAL | 3 refills | Status: DC

## 2019-09-09 NOTE — Progress Notes (Signed)
Metrics: Intervention Frequency ACO  Documented Smoking Status Yearly  Screened one or more times in 24 months  Cessation Counseling or  Active cessation medication Past 24 months  Past 24 months   Guideline developer: UpToDate (See UpToDate for funding source) Date Released: 2014       Wellness Office Visit  Subjective:  Patient ID: Annette Saunders, female    DOB: 11/15/1945  Age: 74 y.o. MRN: [id number]  CC: This lady comes in for follow-up of hypertension, hypothyroidism, obesity, malaise and fatigue. HPI  She is doing reasonably well but her main complaint is 1 of fatigue.  She has gained weight again.  I am not sure how consistent she has been with her diet. Past Medical History:  Diagnosis Date  . Anxiety   . Essential hypertension, benign 06/06/2019  . Generalized osteoarthritis   . GERD (gastroesophageal reflux disease)   . Hyperlipidemia   . Hypothyroidism, adult 06/06/2019  . Insomnia   . Malaise and fatigue 06/06/2019  . Obesity (BMI 30.0-34.9) 06/06/2019      Family History  Problem Relation Age of Onset  . Colon cancer Neg Hx     Social History   Social History Narrative   Widow for 4 years-husband died of MI.Lives alone.   Social History   Tobacco Use  . Smoking status: Never Smoker  . Smokeless tobacco: Never Used  Substance Use Topics  . Alcohol use: No    Alcohol/week: 0.0 standard drinks    Current Meds  Medication Sig  . apixaban (ELIQUIS) 5 MG TABS tablet Take 5 mg by mouth 2 (two) times daily.  . Cholecalciferol (VITAMIN D) 125 MCG (5000 UT) CAPS Take 5,000 Units by mouth daily.   [name] dicyclomine (BENTYL) 10 MG capsule Take 1 capsule (10 mg total) by mouth 2 (two) times daily as needed.  [name] escitalopram (LEXAPRO) 5 MG tablet TAKE 1 TABLET BY MOUTH DAILY (Patient taking differently: Take 5 mg by mouth 2 (two) times daily. )  . pantoprazole (PROTONIX) 40 MG tablet Take 1 tablet (40 mg total) by mouth 2 (two) times daily before a meal. 30 minutes  before meals  . progesterone (PROMETRIUM) 200 MG capsule TAKE 3 CAPSULES BY MOUTH EVERY NIGHT  . thyroid (NP THYROID) 120 MG tablet Take 1 tablet (120 mg total) by mouth 2 (two) times daily.  . [DISCONTINUED] escitalopram (LEXAPRO) 10 MG tablet TAKE 1 TABLET BY MOUTH ONCE DAILY  . [DISCONTINUED] thyroid (NP THYROID) 120 MG tablet Take 1 tablet (120 mg total) by mouth daily.  . [DISCONTINUED] thyroid (NP THYROID) 60 MG tablet Take 1 tablet (60 mg total) by mouth daily before breakfast.      Objective:   Today's Vitals: BP 130/77 (BP Location: Right Arm, Patient Position: Sitting, Cuff Size: Normal)   Pulse 89   Temp (!) 97.3 F (36.3 C) (Temporal)   Resp 18   Ht 5\' 5"  (1.651 m)   Wt 202 lb 8 oz (91.9 kg)   SpO2 98%   BMI 33.70 kg/m  Vitals with BMI 09/09/2019 07/23/2019 07/09/2019  Height 5\' 5"  5\' 1"  5\' 1"   Weight 202 lbs 8 oz 194 lbs 194 lbs  BMI 33.7 0000000 0000000  Systolic [id number]  Diastolic 77 - 87  Pulse 89 - 93     Physical Exam  Her blood pressure is controlled.  She remains obese and she has gained 8 pounds back.  She is alert and orientated without any focal neurological signs.  Assessment   1. Hypothyroidism, adult   2. Essential hypertension, benign   3. Malaise and fatigue       Tests ordered No orders of the defined types were placed in this encounter.    Plan: 1. I reviewed her thyroid function tests on the last visit and I think we can afford to optimize her thyroid further so I have told her to take a dose of NP thyroid 120 mg twice a day now to see if she will tolerate this. 2. In the meantime, she will continue to make sure she is well-hydrated and drink plenty of water. 3. I have given her diet sheet again to try and follow this. 4. I will see her in about a month's time to see how she is doing and we will do blood work again.   Meds ordered this encounter  Medications  . thyroid (NP THYROID) 120 MG tablet    Sig: Take 1 tablet (120 mg  total) by mouth 2 (two) times daily.    Dispense:  60 tablet    Refill:  3    Carliyah Cotterman C Maday Guarino, MD  TIME SPENT : 30 minutes

## 2019-09-09 NOTE — Patient Instructions (Signed)
[facility] Dietary Recommendations for Weight Loss What to Avoid . Avoid added sugars o Often added sugar can be found in processed foods such as many condiments, dry cereals, cakes, cookies, chips, crisps, crackers, candies, sweetened drinks, etc.  o Read labels and AVOID/DECREASE use of foods with the following in their ingredient list: Sugar, fructose, high fructose corn syrup, sucrose, glucose, maltose, dextrose, molasses, cane sugar, brown sugar, any type of syrup, agave nectar, etc.   . Avoid snacking in between meals . Avoid foods made with flour o If you are going to eat food made with flour, choose those made with whole-grains; and, minimize your consumption as much as is tolerable . Avoid processed foods o These foods are generally stocked in the middle of the grocery store. Focus on shopping on the perimeter of the grocery.  . Avoid Meat  o We recommend following a plant-based diet at [facility]. Thus, we recommend avoiding meat as a general rule. Consider eating beans, legumes, eggs, and/or dairy products for regular protein sources o If you plan on eating meat limit to 4 ounces of meat at a time and choose lean options such as Fish, chicken, turkey. Avoid red meat intake such as pork and/or steak What to Include . Vegetables o GREEN LEAFY VEGETABLES: Kale, spinach, mustard greens, collard greens, cabbage, broccoli, etc. o OTHER: Asparagus, cauliflower, eggplant, carrots, peas, Brussel sprouts, tomatoes, bell peppers, zucchini, beets, cucumbers, etc. . Grains, seeds, and legumes o Beans: kidney beans, black eyed peas, garbanzo beans, black beans, pinto beans, etc. o Whole, unrefined grains: brown rice, barley, bulgur, oatmeal, etc. . Healthy fats  o Avoid highly processed fats such as vegetable oil o Examples of healthy fats: avocado, olives, virgin olive oil, dark chocolate (?72% Cocoa), nuts (peanuts, almonds, walnuts, cashews, pecans, etc.) . None to Low  Intake of Animal Sources of Protein o Meat sources: chicken, turkey, salmon, tuna. Limit to 4 ounces of meat at one time. o Consider limiting dairy sources, but when choosing dairy focus on: PLAIN Greek yogurt, cottage cheese, high-protein milk . Fruit o Choose berries  When to Eat . Intermittent Fasting: o Choosing not to eat for a specific time period, but DO FOCUS ON HYDRATION when fasting o Multiple Techniques: - Time Restricted Eating: eat 3 meals in a day, each meal lasting no more than 60 minutes, no snacks between meals - 16-18 hour fast: fast for 16 to 18 hours up to 7 days a week. Often suggested to start with 2-3 nonconsecutive days per week.  . Remember the time you sleep is counted as fasting.  . Examples of eating schedule: Fast from 7:00pm-11:00am. Eat between 11:00am-7:00pm.  - 24-hour fast: fast for 24 hours up to every other day. Often suggested to start with 1 day per week . Remember the time you sleep is counted as fasting . Examples of eating schedule:  o Eating day: eat 2-3 meals on your eating day. If doing 2 meals, each meal should last no more than 90 minutes. If doing 3 meals, each meal should last no more than 60 minutes. Finish last meal by 7:00pm. o Fasting day: Fast until 7:00pm.  o IF YOU FEEL UNWELL FOR ANY REASON/IN ANY WAY WHEN FASTING, STOP FASTING BY EATING A NUTRITIOUS SNACK OR LIGHT MEAL o ALWAYS FOCUS ON HYDRATION DURING FASTS - Acceptable Hydration sources: water, broths, tea/coffee (black tea/coffee is best but using a small amount of whole-fat dairy products in coffee/tea is acceptable).  -   Poor Hydration Sources: anything with sugar or artificial sweeteners added to it  These recommendations have been developed for patients that are actively receiving medical care from either Dr. Zareen Jamison or Sarah Gray, DNP, NP-C at [facility]. These recommendations are developed for patients with specific medical conditions and are not meant to be  distributed or used by others that are not actively receiving care from either provider listed above at [facility]. It is not appropriate to participate in the above eating plans without proper medical supervision.   Reference: Fung, J. The obesity code. [place]: [place]; 2016.   

## 2019-09-12 ENCOUNTER — Ambulatory Visit: Payer: Medicare Other | Admitting: Gastroenterology

## 2019-09-16 NOTE — Progress Notes (Signed)
Referring Provider: Doree Albee, MD Primary Care Physician:  Doree Albee, MD  Primary GI: [facility]   Chief Complaint  Patient presents with  . Gastroesophageal Reflux    sometimes she will have to take a bentyl after eating certain foods    HPI:   Sarahrose Beliles is a 74 y.o. female presenting today with a history of GERD, history of large hiatal hernia repair a few years ago by Dr. Duke Salvia, but we have had difficulty obtaining records. Reportedly had an upper endoscopy by Dr. Cristine Polio several years ago, but this was unable to be retrieved as well. Colonoscopy on file from 2016.BPE April 2019 with prior fundoplication without recurrent hiatal hernia, otherwise normal.  Has to push left side of neck to get food down sometimes. Chronic solid food dysphagia. She wants to hold off on EGD until Covid settles.   Had bad pains in lower abdomen at night for about 2 weeks. No constipation or diarrhea. Nothing relieves until getting up. Will start feeling it towards the morning. Pain feels crampy like a menstrual cycle. No fever or chills. Only occurs during evening and while laying down. No fever or chills. No changes in bowel habits.   Past Medical History:  Diagnosis Date  . Anxiety   . Essential hypertension, benign 06/06/2019  . Generalized osteoarthritis   . GERD (gastroesophageal reflux disease)   . Hyperlipidemia   . Hypothyroidism, adult 06/06/2019  . Insomnia   . Malaise and fatigue 06/06/2019  . Obesity (BMI 30.0-34.9) 06/06/2019    Past Surgical History:  Procedure Laterality Date  . COLONOSCOPY  10/2014   Dr. Cristine Polio, outside hospital. Colonoscopy performed with Propofol, no polyps, small to medium size internal hemorrhoids noted.   [name] ECTOPIC PREGNANCY SURGERY    . ELBOW FRACTURE SURGERY    . HIATAL HERNIA REPAIR    . JOINT REPLACEMENT     bilateral knee  . TONSILLECTOMY      Current Outpatient Medications  Medication Sig Dispense Refill  . Cholecalciferol  (VITAMIN D) 125 MCG (5000 UT) CAPS Take 5,000 Units by mouth daily.     [name] dicyclomine (BENTYL) 10 MG capsule Take 1 capsule (10 mg total) by mouth 2 (two) times daily as needed. (Patient taking differently: Take 10 mg by mouth 2 (two) times daily. ) 120 capsule 1  . escitalopram (LEXAPRO) 5 MG tablet TAKE 1 TABLET BY MOUTH DAILY (Patient taking differently: Take 5 mg by mouth 2 (two) times daily. ) 90 tablet 0  . pantoprazole (PROTONIX) 40 MG tablet Take 1 tablet (40 mg total) by mouth 2 (two) times daily before a meal. 30 minutes before meals 60 tablet 3  . progesterone (PROMETRIUM) 200 MG capsule TAKE 3 CAPSULES BY MOUTH EVERY NIGHT 90 capsule 3  . thyroid (NP THYROID) 120 MG tablet Take 1 tablet (120 mg total) by mouth 2 (two) times daily. 60 tablet 3   No current facility-administered medications for this visit.    Allergies as of 09/17/2019 - Review Complete 09/17/2019  Allergen Reaction Noted  . Dilantin [phenytoin sodium extended] Other (See Comments) 03/28/2019  . Doxycycline hyclate  12/27/2018    Family History  Problem Relation Age of Onset  . Colon cancer Neg Hx     Social History   Socioeconomic History  . Marital status: Widowed    Spouse name: Not on file  . Number of children: Not on file  . Years of education: Not on file  . Highest education  level: Not on file  Occupational History  . Not on file  Tobacco Use  . Smoking status: Never Smoker  . Smokeless tobacco: Never Used  Substance and Sexual Activity  . Alcohol use: No    Alcohol/week: 0.0 standard drinks  . Drug use: No  . Sexual activity: Not on file  Other Topics Concern  . Not on file  Social History Narrative   Widow for 77 years-husband died of MI.Lives alone.   Social Determinants of Health   Financial Resource Strain:   . Difficulty of Paying Living Expenses: Not on file  Food Insecurity:   . Worried About Charity fundraiser in the Last Year: Not on file  . Ran Out of Food in the Last  Year: Not on file  Transportation Needs:   . Lack of Transportation (Medical): Not on file  . Lack of Transportation (Non-Medical): Not on file  Physical Activity:   . Days of Exercise per Week: Not on file  . Minutes of Exercise per Session: Not on file  Stress:   . Feeling of Stress : Not on file  Social Connections:   . Frequency of Communication with Friends and Family: Not on file  . Frequency of Social Gatherings with Friends and Family: Not on file  . Attends Religious Services: Not on file  . Active Member of Clubs or Organizations: Not on file  . Attends Archivist Meetings: Not on file  . Marital Status: Not on file    Review of Systems: Gen: Denies fever, chills, anorexia. Denies fatigue, weakness, weight loss.  CV: Denies chest pain, palpitations, syncope, peripheral edema, and claudication. Resp: Denies dyspnea at rest, cough, wheezing, coughing up blood, and pleurisy. GI: see HPI Derm: Denies rash, itching, dry skin Psych: Denies depression, anxiety, memory loss, confusion. No homicidal or suicidal ideation.  Heme: Denies bruising, bleeding, and enlarged lymph nodes.  Physical Exam: BP (!) 158/85   Pulse 75   Temp 98.2 F (36.8 C) (Oral)   Ht 5' (1.524 m)   Wt 204 lb 6.4 oz (92.7 kg)   BMI 39.92 kg/m  General:   Alert and oriented. No distress noted. Pleasant and cooperative.  Head:  Normocephalic and atraumatic. Eyes:  Conjuctiva clear without scleral icterus. Abdomen:  +BS, soft, non-tender and non-distended. No rebound or guarding. No HSM or masses noted. Msk:  Symmetrical without gross deformities. Normal posture. Extremities:  Without edema. Neurologic:  Alert and  oriented x4 Psych:  Alert and cooperative. Normal mood and affect.  ASSESSMENT: Venis Banke is a 74 y.o. female presenting today with chronic dysphagia, last EGD at outside facility several years ago, BPE on file from April 2019 with prior fundoplication without recurrent  hiatal hernia. GERD overall controlled with occasional flares. Now with lower abdominal pain occurring only while laying down.  Dysphagia: recommend EGD/dilation. She is wanting to hold off on this during [place]. Continue PPI BID. Pepcid prn GERD exacerbation at night.  Lower abdominal pain: declining imaging. Unclear etiology. No changes in bowel habits, rectal bleeding, and seems to be positional as only occurs when laying down at night. She will monitor this and call if worsening or unchanged. Recommend CT if willing.    PLAN:   Continue PPI BID  EGD/dilation when patient is willing  Recommend imaging if persistent abdominal pain  Return in 3 months  Annitta Needs, PhD, [facility]

## 2019-09-17 ENCOUNTER — Encounter: Payer: Self-pay | Admitting: Gastroenterology

## 2019-09-17 ENCOUNTER — Other Ambulatory Visit: Payer: Self-pay

## 2019-09-17 ENCOUNTER — Ambulatory Visit (INDEPENDENT_AMBULATORY_CARE_PROVIDER_SITE_OTHER): Payer: Medicare Other | Admitting: Gastroenterology

## 2019-09-17 VITALS — BP 158/85 | HR 75 | Temp 98.2°F | Ht 60.0 in | Wt 204.4 lb

## 2019-09-17 DIAGNOSIS — R109 Unspecified abdominal pain: Secondary | ICD-10-CM | POA: Insufficient documentation

## 2019-09-17 DIAGNOSIS — K219 Gastro-esophageal reflux disease without esophagitis: Secondary | ICD-10-CM

## 2019-09-17 DIAGNOSIS — R1030 Lower abdominal pain, unspecified: Secondary | ICD-10-CM | POA: Diagnosis not present

## 2019-09-17 NOTE — Patient Instructions (Addendum)
Please let me know if pain worsens!  Continue Protonix twice a day. If needed for nighttime reflux, you can take Pepcid over-the-counter.  I will see you in 3 months!  I enjoyed seeing you again today! As you know, I value our relationship and want to provide genuine, compassionate, and quality care. I welcome your feedback. If you receive a survey regarding your visit,  I greatly appreciate you taking time to fill this out. See you next time!  Annitta Needs, PhD, ANP-BC [facility]

## 2019-09-23 ENCOUNTER — Telehealth (INDEPENDENT_AMBULATORY_CARE_PROVIDER_SITE_OTHER): Payer: Self-pay

## 2019-09-23 NOTE — Telephone Encounter (Signed)
Pt is fasting and not losing weight, but feels really tired, wants to see you today or tomorrow.

## 2019-09-24 ENCOUNTER — Ambulatory Visit: Payer: Medicare Other | Admitting: Orthopaedic Surgery

## 2019-09-26 ENCOUNTER — Ambulatory Visit (INDEPENDENT_AMBULATORY_CARE_PROVIDER_SITE_OTHER): Payer: Medicare Other | Admitting: Internal Medicine

## 2019-09-28 ENCOUNTER — Other Ambulatory Visit: Payer: Self-pay | Admitting: Gastroenterology

## 2019-10-07 ENCOUNTER — Other Ambulatory Visit (INDEPENDENT_AMBULATORY_CARE_PROVIDER_SITE_OTHER): Payer: Self-pay | Admitting: Internal Medicine

## 2019-10-08 ENCOUNTER — Ambulatory Visit (INDEPENDENT_AMBULATORY_CARE_PROVIDER_SITE_OTHER): Payer: Medicare Other | Admitting: Internal Medicine

## 2019-10-08 ENCOUNTER — Encounter (INDEPENDENT_AMBULATORY_CARE_PROVIDER_SITE_OTHER): Payer: Self-pay | Admitting: Internal Medicine

## 2019-10-08 DIAGNOSIS — R112 Nausea with vomiting, unspecified: Secondary | ICD-10-CM

## 2019-10-08 DIAGNOSIS — K591 Functional diarrhea: Secondary | ICD-10-CM | POA: Diagnosis not present

## 2019-10-08 MED ORDER — ONDANSETRON HCL 4 MG PO TABS: 4.0000 mg | ORAL_TABLET | Freq: Three times a day (TID) | ORAL | 0 refills | Status: DC | PRN

## 2019-10-08 NOTE — Progress Notes (Signed)
Metrics: Intervention Frequency ACO  Documented Smoking Status Yearly  Screened one or more times in 24 months  Cessation Counseling or  Active cessation medication Past 24 months  Past 24 months   Guideline developer: UpToDate (See UpToDate for funding source) Date Released: 2014       Wellness Office Visit  Subjective:  Patient ID: Annette Saunders, female    DOB: [birth year]  Age: 74 y.o. MRN: [id number]  CC: This is an audio telemedicine visit with the permission of the patient who is at home and I am in my office.  I was able to easily recognize her voice. Her chief complaint is nausea, vomiting and diarrhea. HPI  The patient has had symptoms for the last 24 hours.  It was her birthday yesterday and she clearly believes that she had dietary indiscretion.  She is tending to feel slightly better today but she still has some diarrhea.  Nausea and vomiting or not a major problem.  She denies any fever.  She has been sneezing for the last 2 to 3 days and one episode of chills.  She is going to have COVID-19 testing in 2 days time. Past Medical History:  Diagnosis Date  . Anxiety   . Essential hypertension, benign 06/06/2019  . Generalized osteoarthritis   . GERD (gastroesophageal reflux disease)   . Hyperlipidemia   . Hypothyroidism, adult 06/06/2019  . Insomnia   . Malaise and fatigue 06/06/2019  . Obesity (BMI 30.0-34.9) 06/06/2019      Family History  Problem Relation Age of Onset  . Colon cancer Neg Hx     Social History   Social History Narrative   Widow for 34 years-husband died of MI.Lives alone.   Social History   Tobacco Use  . Smoking status: Never Smoker  . Smokeless tobacco: Never Used  Substance Use Topics  . Alcohol use: No    Alcohol/week: 0.0 standard drinks    Current Meds  Medication Sig  . Cholecalciferol (VITAMIN D) 125 MCG (5000 UT) CAPS Take 5,000 Units by mouth daily.   [name] dicyclomine (BENTYL) 10 MG capsule Take 1 capsule (10 mg total) by mouth  2 (two) times daily as needed. (Patient taking differently: Take 10 mg by mouth 2 (two) times daily. )  . escitalopram (LEXAPRO) 5 MG tablet TAKE 1 TABLET BY MOUTH DAILY (Patient taking differently: Take 5 mg by mouth 2 (two) times daily. )  . pantoprazole (PROTONIX) 40 MG tablet TAKE 1 TABLET BY MOUTH TWICE DAILY 30 MINUTES BEFORE A MEAL  . progesterone (PROMETRIUM) 200 MG capsule TAKE 3 CAPSULES BY MOUTH EVERY NIGHT  . thyroid (NP THYROID) 120 MG tablet Take 1 tablet (120 mg total) by mouth 2 (two) times daily.       Objective:   Today's Vitals: There were no vitals taken for this visit. Vitals with BMI 10/08/2019 09/17/2019 09/09/2019  Height (No Data) 5\' 0"  5\' 5"   Weight (No Data) 204 lbs 6 oz 202 lbs 8 oz  BMI - [id number]  Systolic (No Data) 0000000 [id number]  Diastolic (No Data) 85 77  Pulse - 75 89     Physical Exam  She appeared to be alert and orientated on the phone and her speech was normal.     Assessment   1. Functional diarrhea   2. Nausea and vomiting, intractability of vomiting not specified, unspecified vomiting type       Tests ordered No orders of the defined types were placed in this encounter.  Plan: 1. It is possible that her gastrointestinal symptoms are dietary in origin.  She also may have viral origin such as COVID-19 or other viruses.  I agree with COVID-19 testing at this time. 2. I will prescribe for her Zofran empirically for her symptoms of nausea and vomiting if these return.  I have told her she can take Imodium over-the-counter as long as she does not have a fever. 3. If her symptoms worsen, she will need to be seen. 4. This phone call lasted 8 minutes and 41 seconds.   Meds ordered this encounter  Medications  . ondansetron (ZOFRAN) 4 MG tablet    Sig: Take 1 tablet (4 mg total) by mouth every 8 (eight) hours as needed for nausea or vomiting.    Dispense:  20 tablet    Refill:  0    August Gosser Luther Parody, MD

## 2019-10-10 ENCOUNTER — Other Ambulatory Visit: Payer: Medicare Other

## 2019-10-15 ENCOUNTER — Ambulatory Visit (INDEPENDENT_AMBULATORY_CARE_PROVIDER_SITE_OTHER): Payer: Medicare Other | Admitting: Orthopaedic Surgery

## 2019-10-15 ENCOUNTER — Other Ambulatory Visit: Payer: Self-pay

## 2019-10-15 ENCOUNTER — Ambulatory Visit: Payer: Medicare Other

## 2019-10-15 ENCOUNTER — Encounter: Payer: Self-pay | Admitting: Orthopaedic Surgery

## 2019-10-15 VITALS — BP 155/127 | HR 111 | Ht 60.0 in | Wt 195.0 lb

## 2019-10-15 DIAGNOSIS — G8929 Other chronic pain: Secondary | ICD-10-CM | POA: Diagnosis not present

## 2019-10-15 DIAGNOSIS — M79671 Pain in right foot: Secondary | ICD-10-CM

## 2019-10-15 NOTE — Progress Notes (Signed)
Patient [id number] Annette Saunders, female DOB:[birth year], 74 y.o. [id number]  Chief Complaint  Patient presents with  . Foot Pain    right foot pain has increased    HPI  Annette Saunders is a 74 y.o. female who has recurrent right foot pain.  She had done well until all of the cold weather.  She has no trauma.  She has pain on the dorsum of the right foot. She has no swelling, no redness.  She uses BioFreeze.   Body mass index is 38.08 kg/m.  ROS  Review of Systems  Constitutional: Positive for activity change.       Patient does not have diabetes Patient has hypertension Patient does not have COPD Patient does not smoke.  HENT: Negative for congestion.   Respiratory: Negative for cough and shortness of breath.   Cardiovascular: Negative for chest pain.  Musculoskeletal: Positive for back pain, gait problem, myalgias and neck pain.  Allergic/Immunologic: Negative for environmental allergies.  Psychiatric/Behavioral: The patient is nervous/anxious.   All other systems reviewed and are negative.   All other systems reviewed and are negative.  The following is a summary of the past history medically, past history surgically, known current medicines, social history and family history.  This information is gathered electronically by the computer from prior information and documentation.  I review this each visit and have found including this information at this point in the chart is beneficial and informative.    Past Medical History:  Diagnosis Date  . Anxiety   . Essential hypertension, benign 06/06/2019  . Generalized osteoarthritis   . GERD (gastroesophageal reflux disease)   . Hyperlipidemia   . Hypothyroidism, adult 06/06/2019  . Insomnia   . Malaise and fatigue 06/06/2019  . Obesity (BMI 30.0-34.9) 06/06/2019    Past Surgical History:  Procedure Laterality Date  . COLONOSCOPY  10/2014   Dr. Cristine Polio, outside hospital. Colonoscopy performed with Propofol, no polyps, small to  medium size internal hemorrhoids noted.   [name] ECTOPIC PREGNANCY SURGERY    . ELBOW FRACTURE SURGERY    . HIATAL HERNIA REPAIR    . JOINT REPLACEMENT     bilateral knee  . TONSILLECTOMY      Family History  Problem Relation Age of Onset  . Colon cancer Neg Hx     Social History Social History   Tobacco Use  . Smoking status: Never Smoker  . Smokeless tobacco: Never Used  Substance Use Topics  . Alcohol use: No    Alcohol/week: 0.0 standard drinks  . Drug use: No    Allergies  Allergen Reactions  . Dilantin [Phenytoin Sodium Extended] Other (See Comments)    Hallucinations  . Doxycycline Hyclate     Blurry vision     Current Outpatient Medications  Medication Sig Dispense Refill  . Cholecalciferol (VITAMIN D) 125 MCG (5000 UT) CAPS Take 5,000 Units by mouth daily.     [name] dicyclomine (BENTYL) 10 MG capsule Take 1 capsule (10 mg total) by mouth 2 (two) times daily as needed. (Patient taking differently: Take 10 mg by mouth 2 (two) times daily. ) 120 capsule 1  . escitalopram (LEXAPRO) 5 MG tablet TAKE 1 TABLET BY MOUTH DAILY (Patient taking differently: Take 5 mg by mouth 2 (two) times daily. ) 90 tablet 0  . ondansetron (ZOFRAN) 4 MG tablet Take 1 tablet (4 mg total) by mouth every 8 (eight) hours as needed for nausea or vomiting. 20 tablet 0  . pantoprazole (PROTONIX) 40 MG tablet TAKE  1 TABLET BY MOUTH TWICE DAILY 30 MINUTES BEFORE A MEAL 60 tablet 3  . progesterone (PROMETRIUM) 200 MG capsule TAKE 3 CAPSULES BY MOUTH EVERY NIGHT 90 capsule 3  . thyroid (NP THYROID) 120 MG tablet Take 1 tablet (120 mg total) by mouth 2 (two) times daily. 60 tablet 3   No current facility-administered medications for this visit.     Physical Exam  Blood pressure (!) 155/127, pulse (!) 111, height 5' (1.524 m), weight 195 lb (88.5 kg).  Constitutional: overall normal hygiene, normal nutrition, well developed, normal grooming, normal body habitus. Assistive  device:none  Musculoskeletal: gait and station Limp right, muscle tone and strength are normal, no tremors or atrophy is present.  .  Neurological: coordination overall normal.  Deep tendon reflex/nerve stretch intact.  Sensation normal.  Cranial nerves II-XII intact.   Skin:   Normal overall no scars, lesions, ulcers or rashes. No psoriasis.  Psychiatric: Alert and oriented x 3.  Recent memory intact, remote memory unclear.  Normal mood and affect. Well groomed.  Good eye contact.  Cardiovascular: overall no swelling, no varicosities, no edema bilaterally, normal temperatures of the legs and arms, no clubbing, cyanosis and good capillary refill.  Lymphatic: palpation is normal.  Right foot has tenderness of the dorsum of the foot but no swelling, no redness.  ROM of ankle is full.  NV intact.  All other systems reviewed and are negative   The patient has been educated about the nature of the problem(s) and counseled on treatment options.  The patient appeared to understand what I have discussed and is in agreement with it.  Encounter Diagnosis  Name Primary?  . Chronic foot pain, right Yes   X-rays were done of the right foot, reported separately.  PLAN Call if any problems.  Precautions discussed.  Continue current medications.   Return to clinic 1 month   Use Aspercreme to the foot.  Exercises toes as I have explained.  Electronically Signed Sanjuana Kava, MD 2/16/202111:09 AM

## 2019-10-16 ENCOUNTER — Encounter (INDEPENDENT_AMBULATORY_CARE_PROVIDER_SITE_OTHER): Payer: Self-pay | Admitting: Nurse Practitioner

## 2019-10-16 ENCOUNTER — Ambulatory Visit (INDEPENDENT_AMBULATORY_CARE_PROVIDER_SITE_OTHER): Payer: Medicare Other | Admitting: Nurse Practitioner

## 2019-10-16 ENCOUNTER — Telehealth (INDEPENDENT_AMBULATORY_CARE_PROVIDER_SITE_OTHER): Payer: Self-pay

## 2019-10-16 VITALS — BP 120/79 | HR 107 | Temp 98.0°F | Ht 60.0 in | Wt 201.2 lb

## 2019-10-16 DIAGNOSIS — R5381 Other malaise: Secondary | ICD-10-CM

## 2019-10-16 DIAGNOSIS — H6690 Otitis media, unspecified, unspecified ear: Secondary | ICD-10-CM | POA: Insufficient documentation

## 2019-10-16 DIAGNOSIS — R0602 Shortness of breath: Secondary | ICD-10-CM

## 2019-10-16 DIAGNOSIS — N289 Disorder of kidney and ureter, unspecified: Secondary | ICD-10-CM | POA: Diagnosis not present

## 2019-10-16 DIAGNOSIS — I10 Essential (primary) hypertension: Secondary | ICD-10-CM

## 2019-10-16 DIAGNOSIS — E559 Vitamin D deficiency, unspecified: Secondary | ICD-10-CM | POA: Diagnosis not present

## 2019-10-16 DIAGNOSIS — Z0000 Encounter for general adult medical examination without abnormal findings: Secondary | ICD-10-CM | POA: Diagnosis not present

## 2019-10-16 DIAGNOSIS — Z712 Person consulting for explanation of examination or test findings: Secondary | ICD-10-CM | POA: Insufficient documentation

## 2019-10-16 DIAGNOSIS — R600 Localized edema: Secondary | ICD-10-CM

## 2019-10-16 DIAGNOSIS — R5383 Other fatigue: Secondary | ICD-10-CM

## 2019-10-16 DIAGNOSIS — N644 Mastodynia: Secondary | ICD-10-CM | POA: Diagnosis not present

## 2019-10-16 DIAGNOSIS — E039 Hypothyroidism, unspecified: Secondary | ICD-10-CM | POA: Diagnosis not present

## 2019-10-16 DIAGNOSIS — F419 Anxiety disorder, unspecified: Secondary | ICD-10-CM

## 2019-10-16 MED ORDER — AMOXICILLIN-POT CLAVULANATE 875-125 MG PO TABS: 1.0000 | ORAL_TABLET | Freq: Two times a day (BID) | ORAL | 0 refills | Status: DC

## 2019-10-16 MED ORDER — BUSPIRONE HCL 5 MG PO TABS: 5.0000 mg | ORAL_TABLET | Freq: Three times a day (TID) | ORAL | 2 refills | Status: DC | PRN

## 2019-10-16 MED ORDER — ESCITALOPRAM OXALATE 10 MG PO TABS: 10.0000 mg | ORAL_TABLET | Freq: Every day | ORAL | 0 refills | Status: DC

## 2019-10-16 NOTE — Assessment & Plan Note (Signed)
She will continue on her thyroid medication we will collect thyroid panel today for further evaluation.

## 2019-10-16 NOTE — Assessment & Plan Note (Signed)
She will be due for tetanus vaccine when she completes the COVID-19 vaccinations.  She is up-to-date with all other recommended health care screenings.

## 2019-10-16 NOTE — Progress Notes (Signed)
Subjective:  Patient ID: Annette Saunders, female    DOB: 12-08-1945  Age: 74 y.o. MRN: [id number]  CC:  Chief Complaint  Patient presents with  . Other    Shortness of breath, health maintenance, breast pain, discuss lab work, left ear pain      HPI  This pleasant 74 year old female positive for medical history of hypertension, hypothyroidism, diverticulosis, fatigue comes in today for her annual Medicare wellness visit, but due to her concerns this has been changed to an office visit.  Shortness of breath: This patient tells me that she has shortness of breath especially with activity.  She denies any chest pain, coughing, wheezing.  She denies orthopnea, paroxysmal nocturnal dyspnea.  She does have bilateral lower extremity edema.  She is wondering if she should follow-up with her cardiologist for stress testing.  Health maintenance: She is due for tetanus vaccine, but is up-to-date with other vaccines.  She is in the process of getting the COVID-19 vaccinations administered.  She is up-to-date with colon cancer screening, she is due for breast cancer screening, she is due for depression screening, she is due for fall screening, she has undergone hepatitis C screening in October 2020 is negative, she is up-to-date with bone density screening.  Breast pain: She also mentioned to me she is been having bilateral breast pain for approximately 1 month.  She denies any skin changes, palpable masses or lumps, nipple inversion, or nipple discharge.  Discussed lab results: She is due to discuss her lab results.  Left ear pain: She also mentions that she has been having left ear pain for approximately 2 to 3 days.  She rates it as a 4 out of 10.  She denies any ear discharge.   Past Medical History:  Diagnosis Date  . Anxiety   . Essential hypertension, benign 06/06/2019  . Generalized osteoarthritis   . GERD (gastroesophageal reflux disease)   . Hyperlipidemia   . Hypothyroidism, adult  06/06/2019  . Insomnia   . Malaise and fatigue 06/06/2019  . Obesity (BMI 30.0-34.9) 06/06/2019      Family History  Problem Relation Age of Onset  . Colon cancer Neg Hx     Social History   Social History Narrative   Widow for 90 years-husband died of MI.Lives alone.   Social History   Tobacco Use  . Smoking status: Never Smoker  . Smokeless tobacco: Never Used  Substance Use Topics  . Alcohol use: No    Alcohol/week: 0.0 standard drinks     Current Meds  Medication Sig  . Cholecalciferol (VITAMIN D) 125 MCG (5000 UT) CAPS Take 5,000 Units by mouth daily.   [name] dicyclomine (BENTYL) 10 MG capsule Take 1 capsule (10 mg total) by mouth 2 (two) times daily as needed. (Patient taking differently: Take 10 mg by mouth 2 (two) times daily. )  . escitalopram (LEXAPRO) 5 MG tablet TAKE 1 TABLET BY MOUTH DAILY (Patient taking differently: Take 5 mg by mouth 2 (two) times daily. )  . ondansetron (ZOFRAN) 4 MG tablet Take 1 tablet (4 mg total) by mouth every 8 (eight) hours as needed for nausea or vomiting.  . pantoprazole (PROTONIX) 40 MG tablet TAKE 1 TABLET BY MOUTH TWICE DAILY 30 MINUTES BEFORE A MEAL  . progesterone (PROMETRIUM) 200 MG capsule TAKE 3 CAPSULES BY MOUTH EVERY NIGHT  . thyroid (NP THYROID) 120 MG tablet Take 1 tablet (120 mg total) by mouth 2 (two) times daily.    ROS:  Review of Systems  Constitutional: Positive for malaise/fatigue. Negative for fever.  HENT: Positive for ear pain. Negative for ear discharge.   Eyes: Negative for blurred vision.  Respiratory: Positive for shortness of breath. Negative for cough and wheezing.   Cardiovascular: Positive for leg swelling. Negative for chest pain, palpitations, orthopnea and PND.  Psychiatric/Behavioral: Negative for depression and suicidal ideas.     Objective:   Today's Vitals: BP 120/79 (BP Location: Right Arm, Patient Position: Sitting, Cuff Size: Normal)   Pulse (!) 107   Temp 98 F (36.7 C) (Temporal)    Ht 5' (1.524 m)   Wt 201 lb 3.2 oz (91.3 kg)   SpO2 94%   BMI 39.29 kg/m  Vitals with BMI 10/16/2019 10/15/2019 10/08/2019  Height '5\' 0"'$  '5\' 0"'$  (No Data)  Weight 201 lbs 3 oz 195 lbs (No Data)  BMI 62.69 48.54 -  Systolic 627 035 (No Data)  Diastolic 79 009 (No Data)  Pulse 107 111 -     Physical Exam Vitals reviewed. Exam conducted with a chaperone present.  Constitutional:      Appearance: Normal appearance.  HENT:     Head: Normocephalic and atraumatic.     Right Ear: Tympanic membrane, ear canal and external ear normal.     Left Ear: Tympanic membrane and external ear normal. Tenderness present. Tympanic membrane is not erythematous.     Ears:     Comments: (+) erythematous ear canal Eyes:     General:        Right eye: No discharge.        Left eye: No discharge.     Extraocular Movements: Extraocular movements intact.     Conjunctiva/sclera: Conjunctivae normal.     Pupils: Pupils are equal, round, and reactive to light.  Neck:     Vascular: No carotid bruit.  Cardiovascular:     Rate and Rhythm: Normal rate and regular rhythm.     Pulses: Normal pulses.     Heart sounds: Normal heart sounds. No murmur.  Pulmonary:     Effort: Pulmonary effort is normal.     Breath sounds: Normal breath sounds.  Chest:     Breasts: Breasts are symmetrical.        Right: Normal.        Left: Normal.    Abdominal:     General: Abdomen is flat. Bowel sounds are normal. There is no distension.     Palpations: Abdomen is soft. There is no mass.     Tenderness: There is no abdominal tenderness.  Musculoskeletal:        General: No tenderness.     Cervical back: Neck supple. No muscular tenderness.     Right lower leg: No edema.     Left lower leg: No edema.  Lymphadenopathy:     Cervical: No cervical adenopathy.     Upper Body:     Right upper body: No supraclavicular adenopathy.     Left upper body: No supraclavicular adenopathy.  Skin:    General: Skin is warm and dry.    Neurological:     General: No focal deficit present.     Mental Status: She is alert and oriented to person, place, and time.     Motor: No weakness.     Gait: Gait normal.  Psychiatric:        Mood and Affect: Mood normal.        Behavior: Behavior normal.  Judgment: Judgment normal.          Assessment   1. Essential hypertension, benign   2. Hypothyroidism, adult   3. Vitamin D deficiency   4. Fatigue, unspecified type   5. Acute otitis media, unspecified otitis media type       Tests ordered Orders Placed This Encounter  Procedures  . Vitamin D, 25-hydroxy  . Lipid Panel  . TSH  . T3, Free  . T4, Free  . CMP with eGFR([facility])     Plan: Please see assessment and plan per problem list below.   Meds ordered this encounter  Medications  . amoxicillin-clavulanate (AUGMENTIN) 875-125 MG tablet    Sig: Take 1 tablet by mouth 2 (two) times daily.    Dispense:  20 tablet    Refill:  0    Order Specific Question:   Supervising Provider    Answer:   Doree Albee [[id number]]    Patient to follow-up in 3 months  Ailene Ards, NP

## 2019-10-16 NOTE — Assessment & Plan Note (Signed)
I will prescribe her a course of Augmentin.

## 2019-10-16 NOTE — Telephone Encounter (Signed)
Annette Saunders is asking for a returned call from you she has a question, please advise?

## 2019-10-16 NOTE — Assessment & Plan Note (Signed)
I will collect metabolic panel for further evaluation of renal insufficiency noted on her last blood work.

## 2019-10-16 NOTE — Patient Instructions (Signed)
Thank you for choosing [facility] as your medical provider! If you have any questions or concerns regarding your health care, please do not hesitate to call our office.  Continue your medications as prescribed.  I will send order for diagnostic mammogram for further evaluation of your bilateral breast tenderness.  Please do not hesitate to call us with any questions or concerns prior to her next appointment.  You will be due for a tetanus shot once you have completed the Covid vaccines.  I will let you know your blood results show via MyChart.  Please follow-up as scheduled in 3 months. We look forward to seeing you again soon!   At [facility] we value your feedback. You may receive a survey about your visit today. Please share your experience as we strive to create trusting relationships with our patients to provide genuine, compassionate, quality care.  We appreciate your understanding and patience as we review any laboratory studies, imaging, and other diagnostic tests that are ordered as we care for you. We do our best to address any and all results in a timely manner. If you do not hear about test results within 1 week, please do not hesitate to contact us. If we referred you to a specialist during your visit or ordered imaging testing, contact the office if you have not been contacted to be scheduled within 1 weeks.  We also encourage the use of MyChart, which contains your medical information for your review as well. If you are not enrolled in this feature, an access code is on this after visit summary for your convenience. Thank you for allowing [place] to be involved in your care.

## 2019-10-16 NOTE — Telephone Encounter (Signed)
I did call this patient back.  She told me that she forgot to discuss her anxiety with me.  I have sent in medications to treat this to her pharmacy.  Thank you.

## 2019-10-16 NOTE — Assessment & Plan Note (Signed)
Blood pressure controlled at this time no changes to management.

## 2019-10-16 NOTE — Assessment & Plan Note (Addendum)
I discussed her lab work results from October with her today.  Will request metabolic panel for further evaluation of renal insufficiency noted on this lab work.

## 2019-10-16 NOTE — Assessment & Plan Note (Signed)
I will refer her to have diagnostic bilateral mammogram for further evaluation.

## 2019-10-16 NOTE — Assessment & Plan Note (Signed)
This has been an ongoing problem for Annette Saunders.  She will continue on her medication as prescribed, but we will collect thyroid panel for further evaluation today.

## 2019-10-16 NOTE — Assessment & Plan Note (Addendum)
I recommended that she undergo cardiac echocardiogram for further evaluation.  She is agreeable to this.  We will try to get insurance authorization and send her for this test.  May refer back to cardiology pending results.

## 2019-10-17 LAB — VITAMIN D 25 HYDROXY (VIT D DEFICIENCY, FRACTURES): Vit D, 25-Hydroxy: 71 ng/mL (ref 30–100)

## 2019-10-17 LAB — COMPLETE METABOLIC PANEL WITH GFR
AG Ratio: 1.2 (calc) (ref 1.0–2.5)
ALT: 12 U/L (ref 6–29)
AST: 18 U/L (ref 10–35)
Albumin: 4.1 g/dL (ref 3.6–5.1)
Alkaline phosphatase (APISO): 78 U/L (ref 37–153)
BUN/Creatinine Ratio: 29 (calc) — ABNORMAL HIGH (ref 6–22)
BUN: 28 mg/dL — ABNORMAL HIGH (ref 7–25)
CO2: 21 mmol/L (ref 20–32)
Calcium: 10.6 mg/dL — ABNORMAL HIGH (ref 8.6–10.4)
Chloride: 105 mmol/L (ref 98–110)
Creat: 0.98 mg/dL — ABNORMAL HIGH (ref 0.60–0.93)
GFR, Est African American: 66 mL/min/{1.73_m2} (ref >=60)
GFR, Est Non African American: 57 mL/min/{1.73_m2} — ABNORMAL LOW (ref >=60)
Globulin: 3.3 g/dL (calc) (ref 1.9–3.7)
Glucose, Bld: 96 mg/dL (ref 65–99)
Potassium: 4.3 mmol/L (ref 3.5–5.3)
Sodium: 139 mmol/L (ref 135–146)
Total Bilirubin: 0.3 mg/dL (ref 0.2–1.2)
Total Protein: 7.4 g/dL (ref 6.1–8.1)

## 2019-10-17 LAB — LIPID PANEL
Cholesterol: 211 mg/dL — ABNORMAL HIGH (ref <200)
HDL: 51 mg/dL (ref >=50)
LDL Cholesterol (Calc): 126 mg/dL (calc) — ABNORMAL HIGH
Non-HDL Cholesterol (Calc): 160 mg/dL (calc) — ABNORMAL HIGH (ref <130)
Total CHOL/HDL Ratio: 4.1 (calc) (ref <5.0)
Triglycerides: 195 mg/dL — ABNORMAL HIGH (ref <150)

## 2019-10-17 LAB — TSH: TSH: <0.01 mIU/L — ABNORMAL LOW (ref 0.40–4.50)

## 2019-10-17 LAB — T4, FREE: Free T4: 2.2 ng/dL — ABNORMAL HIGH (ref 0.8–1.8)

## 2019-10-17 LAB — T3, FREE: T3, Free: 7.7 pg/mL — ABNORMAL HIGH (ref 2.3–4.2)

## 2019-10-22 ENCOUNTER — Encounter (INDEPENDENT_AMBULATORY_CARE_PROVIDER_SITE_OTHER): Payer: Self-pay | Admitting: Internal Medicine

## 2019-10-22 ENCOUNTER — Other Ambulatory Visit (HOSPITAL_COMMUNITY): Payer: Self-pay | Admitting: Nurse Practitioner

## 2019-10-22 ENCOUNTER — Ambulatory Visit (INDEPENDENT_AMBULATORY_CARE_PROVIDER_SITE_OTHER): Payer: Medicare Other | Admitting: Internal Medicine

## 2019-10-22 ENCOUNTER — Other Ambulatory Visit: Payer: Self-pay

## 2019-10-22 DIAGNOSIS — N644 Mastodynia: Secondary | ICD-10-CM

## 2019-10-22 DIAGNOSIS — N898 Other specified noninflammatory disorders of vagina: Secondary | ICD-10-CM | POA: Diagnosis not present

## 2019-10-22 MED ORDER — FLUCONAZOLE 150 MG PO TABS: 150.0000 mg | ORAL_TABLET | Freq: Once | ORAL | 0 refills | Status: AC

## 2019-10-22 NOTE — Progress Notes (Signed)
Metrics: Intervention Frequency ACO  Documented Smoking Status Yearly  Screened one or more times in 24 months  Cessation Counseling or  Active cessation medication Past 24 months  Past 24 months   Guideline developer: UpToDate (See UpToDate for funding source) Date Released: 2014       Wellness Office Visit  Subjective:  Patient ID: Annette Saunders, female    DOB: [birth year]  Age: 74 y.o. MRN: [id number]  CC: This is an audio telemedicine visit with the patient who is at home and I am in my office and with her permission.  I was able to easily recognize her voice. Her chief complaint is vaginal itching  HPI  She apparently went to a bathroom at [facility] and she thinks that she has had symptoms since this time a few days ago.  She denies any dysuria. Past Medical History:  Diagnosis Date  . Anxiety   . Essential hypertension, benign 06/06/2019  . Generalized osteoarthritis   . GERD (gastroesophageal reflux disease)   . Hyperlipidemia   . Hypothyroidism, adult 06/06/2019  . Insomnia   . Malaise and fatigue 06/06/2019  . Obesity (BMI 30.0-34.9) 06/06/2019      Family History  Problem Relation Age of Onset  . Colon cancer Neg Hx     Social History   Social History Narrative   Widow for 80 years-husband died of MI.Lives alone.   Social History   Tobacco Use  . Smoking status: Never Smoker  . Smokeless tobacco: Never Used  Substance Use Topics  . Alcohol use: No    Alcohol/week: 0.0 standard drinks    Current Meds  Medication Sig  . amoxicillin-clavulanate (AUGMENTIN) 875-125 MG tablet Take 1 tablet by mouth 2 (two) times daily.  . busPIRone (BUSPAR) 5 MG tablet Take 1 tablet (5 mg total) by mouth 3 (three) times daily as needed.  . Cholecalciferol (VITAMIN D) 125 MCG (5000 UT) CAPS Take 5,000 Units by mouth daily.   [name] dicyclomine (BENTYL) 10 MG capsule Take 1 capsule (10 mg total) by mouth 2 (two) times daily as needed. (Patient taking differently: Take 10 mg by mouth  2 (two) times daily. )  . escitalopram (LEXAPRO) 10 MG tablet Take 1 tablet (10 mg total) by mouth daily.  . fluconazole (DIFLUCAN) 150 MG tablet Take 1 tablet (150 mg total) by mouth once for 1 dose.  . ondansetron (ZOFRAN) 4 MG tablet Take 1 tablet (4 mg total) by mouth every 8 (eight) hours as needed for nausea or vomiting.  . pantoprazole (PROTONIX) 40 MG tablet TAKE 1 TABLET BY MOUTH TWICE DAILY 30 MINUTES BEFORE A MEAL  . progesterone (PROMETRIUM) 200 MG capsule TAKE 3 CAPSULES BY MOUTH EVERY NIGHT  . thyroid (NP THYROID) 120 MG tablet Take 1 tablet (120 mg total) by mouth 2 (two) times daily.      Objective:   Today's Vitals: There were no vitals taken for this visit. Vitals with BMI 10/22/2019 10/16/2019 10/15/2019  Height (No Data) 5\' 0"  5\' 0"   Weight (No Data) 201 lbs 3 oz 195 lbs  BMI - [id number]  Systolic (No Data) [id number]  Diastolic (No Data) 79 [id number]  Pulse - 107 111     Physical Exam  Her speech is normal on the phone and she appears to be alert and orientated and responds appropriately.     Assessment   1. Vaginal itching       Tests ordered No orders of the defined types were placed in  this encounter.    Plan: 1. I have prescribed Diflucan for her to see if this will help her.  If it does not, she will let me know. 2. This phone call lasted 7 minutes.   Meds ordered this encounter  Medications  . fluconazole (DIFLUCAN) 150 MG tablet    Sig: Take 1 tablet (150 mg total) by mouth once for 1 dose.    Dispense:  1 tablet    Refill:  0    Chardonay Scritchfield Luther Parody, MD

## 2019-10-28 ENCOUNTER — Other Ambulatory Visit: Payer: Self-pay

## 2019-10-28 ENCOUNTER — Ambulatory Visit (HOSPITAL_COMMUNITY)
Admission: RE | Admit: 2019-10-28 | Discharge: 2019-10-28 | Disposition: A | Discharge status: Home / Self Care | Payer: Medicare Other | Source: Ambulatory Visit | Attending: Nurse Practitioner | Admitting: Nurse Practitioner

## 2019-10-28 DIAGNOSIS — R600 Localized edema: Secondary | ICD-10-CM | POA: Diagnosis not present

## 2019-10-28 DIAGNOSIS — R0602 Shortness of breath: Secondary | ICD-10-CM | POA: Insufficient documentation

## 2019-10-28 NOTE — Progress Notes (Signed)
*  PRELIMINARY RESULTS* Echocardiogram 2D Echocardiogram has been performed.  Leavy Cella 10/28/2019, 1:53 PM

## 2019-10-31 ENCOUNTER — Observation Stay (HOSPITAL_COMMUNITY)
Admission: EM | Admit: 2019-10-31 | Discharge: 2019-11-02 | Disposition: A | Discharge status: Home / Self Care | Payer: Medicare Other | Attending: Internal Medicine | Admitting: Internal Medicine

## 2019-10-31 ENCOUNTER — Emergency Department (HOSPITAL_COMMUNITY): Payer: Medicare Other

## 2019-10-31 ENCOUNTER — Observation Stay (HOSPITAL_COMMUNITY): Payer: Medicare Other

## 2019-10-31 ENCOUNTER — Encounter (HOSPITAL_COMMUNITY): Payer: Self-pay | Admitting: Emergency Medicine

## 2019-10-31 ENCOUNTER — Ambulatory Visit (INDEPENDENT_AMBULATORY_CARE_PROVIDER_SITE_OTHER): Payer: Medicare Other | Admitting: Internal Medicine

## 2019-10-31 ENCOUNTER — Other Ambulatory Visit: Payer: Self-pay

## 2019-10-31 ENCOUNTER — Encounter (INDEPENDENT_AMBULATORY_CARE_PROVIDER_SITE_OTHER): Payer: Self-pay | Admitting: Internal Medicine

## 2019-10-31 VITALS — BP 115/80 | HR 72 | Temp 98.2°F | Ht 61.0 in | Wt 199.8 lb

## 2019-10-31 DIAGNOSIS — K56699 Other intestinal obstruction unspecified as to partial versus complete obstruction: Secondary | ICD-10-CM | POA: Insufficient documentation

## 2019-10-31 DIAGNOSIS — K567 Ileus, unspecified: Secondary | ICD-10-CM | POA: Diagnosis not present

## 2019-10-31 DIAGNOSIS — K56609 Unspecified intestinal obstruction, unspecified as to partial versus complete obstruction: Secondary | ICD-10-CM

## 2019-10-31 DIAGNOSIS — F329 Major depressive disorder, single episode, unspecified: Secondary | ICD-10-CM | POA: Insufficient documentation

## 2019-10-31 DIAGNOSIS — Z6837 Body mass index (BMI) 37.0-37.9, adult: Secondary | ICD-10-CM | POA: Insufficient documentation

## 2019-10-31 DIAGNOSIS — E6609 Other obesity due to excess calories: Secondary | ICD-10-CM | POA: Insufficient documentation

## 2019-10-31 DIAGNOSIS — E039 Hypothyroidism, unspecified: Secondary | ICD-10-CM | POA: Insufficient documentation

## 2019-10-31 DIAGNOSIS — R1013 Epigastric pain: Secondary | ICD-10-CM

## 2019-10-31 DIAGNOSIS — R1110 Vomiting, unspecified: Secondary | ICD-10-CM | POA: Diagnosis not present

## 2019-10-31 DIAGNOSIS — Z79899 Other long term (current) drug therapy: Secondary | ICD-10-CM | POA: Insufficient documentation

## 2019-10-31 DIAGNOSIS — R112 Nausea with vomiting, unspecified: Secondary | ICD-10-CM

## 2019-10-31 DIAGNOSIS — I10 Essential (primary) hypertension: Secondary | ICD-10-CM | POA: Diagnosis not present

## 2019-10-31 DIAGNOSIS — F419 Anxiety disorder, unspecified: Secondary | ICD-10-CM | POA: Insufficient documentation

## 2019-10-31 DIAGNOSIS — Z20822 Contact with and (suspected) exposure to covid-19: Secondary | ICD-10-CM | POA: Diagnosis not present

## 2019-10-31 DIAGNOSIS — K219 Gastro-esophageal reflux disease without esophagitis: Secondary | ICD-10-CM | POA: Diagnosis not present

## 2019-10-31 DIAGNOSIS — K5730 Diverticulosis of large intestine without perforation or abscess without bleeding: Secondary | ICD-10-CM | POA: Diagnosis not present

## 2019-10-31 LAB — URINALYSIS, ROUTINE W REFLEX MICROSCOPIC
Bilirubin Urine: NEGATIVE
Glucose, UA: NEGATIVE mg/dL
Hgb urine dipstick: NEGATIVE
Ketones, ur: NEGATIVE mg/dL
Nitrite: NEGATIVE
Protein, ur: NEGATIVE mg/dL
Specific Gravity, Urine: 1.018 (ref 1.005–1.030)
pH: 5 (ref 5.0–8.0)

## 2019-10-31 LAB — COMPREHENSIVE METABOLIC PANEL
ALT: 21 U/L (ref 0–44)
AST: 22 U/L (ref 15–41)
Albumin: 3.8 g/dL (ref 3.5–5.0)
Alkaline Phosphatase: 76 U/L (ref 38–126)
Anion gap: 9 (ref 5–15)
BUN: 18 mg/dL (ref 8–23)
CO2: 23 mmol/L (ref 22–32)
Calcium: 10.5 mg/dL — ABNORMAL HIGH (ref 8.9–10.3)
Chloride: 109 mmol/L (ref 98–111)
Creatinine, Ser: 0.73 mg/dL (ref 0.44–1.00)
GFR calc Af Amer: >60 mL/min (ref >60)
GFR calc non Af Amer: >60 mL/min (ref >60)
Glucose, Bld: 112 mg/dL — ABNORMAL HIGH (ref 70–99)
Potassium: 4.1 mmol/L (ref 3.5–5.1)
Sodium: 141 mmol/L (ref 135–145)
Total Bilirubin: 0.7 mg/dL (ref 0.3–1.2)
Total Protein: 7.7 g/dL (ref 6.5–8.1)

## 2019-10-31 LAB — CBC
HCT: 42 % (ref 36.0–46.0)
Hemoglobin: 13.3 g/dL (ref 12.0–15.0)
MCH: 27.1 pg (ref 26.0–34.0)
MCHC: 31.7 g/dL (ref 30.0–36.0)
MCV: 85.7 fL (ref 80.0–100.0)
Platelets: 334 10*3/uL (ref 150–400)
RBC: 4.9 MIL/uL (ref 3.87–5.11)
RDW: 12.9 % (ref 11.5–15.5)
WBC: 8.2 10*3/uL (ref 4.0–10.5)
nRBC: 0 % (ref 0.0–0.2)

## 2019-10-31 LAB — LIPASE, BLOOD: Lipase: 30 U/L (ref 11–51)

## 2019-10-31 MED ORDER — ONDANSETRON HCL 4 MG PO TABS
4.0000 mg | ORAL_TABLET | Freq: Four times a day (QID) | ORAL | Status: DC | PRN
  Administered 2019-11-01: 4 mg via ORAL
  Filled 2019-10-31: qty 1

## 2019-10-31 MED ORDER — ALUM & MAG HYDROXIDE-SIMETH 200-200-20 MG/5ML PO SUSP
30.0000 mL | Freq: Once | ORAL | Status: AC
  Administered 2019-10-31: 30 mL via ORAL
  Filled 2019-10-31: qty 30

## 2019-10-31 MED ORDER — ACETAMINOPHEN 650 MG RE SUPP: 650.0000 mg | Freq: Four times a day (QID) | RECTAL | Status: DC | PRN

## 2019-10-31 MED ORDER — PANTOPRAZOLE SODIUM 40 MG IV SOLR
40.0000 mg | Freq: Once | INTRAVENOUS | Status: AC
  Administered 2019-10-31: 40 mg via INTRAVENOUS
  Filled 2019-10-31: qty 40

## 2019-10-31 MED ORDER — MORPHINE SULFATE (PF) 4 MG/ML IV SOLN
4.0000 mg | Freq: Once | INTRAVENOUS | Status: AC
  Administered 2019-10-31: 4 mg via INTRAVENOUS
  Filled 2019-10-31: qty 1

## 2019-10-31 MED ORDER — ACETAMINOPHEN 325 MG PO TABS
650.0000 mg | ORAL_TABLET | Freq: Four times a day (QID) | ORAL | Status: DC | PRN
  Administered 2019-11-01 – 2019-11-02 (×2): 650 mg via ORAL
  Filled 2019-10-31 (×2): qty 2

## 2019-10-31 MED ORDER — PANTOPRAZOLE SODIUM 40 MG IV SOLR
40.0000 mg | Freq: Two times a day (BID) | INTRAVENOUS | Status: DC
  Administered 2019-11-01 – 2019-11-02 (×3): 40 mg via INTRAVENOUS
  Filled 2019-10-31 (×3): qty 40

## 2019-10-31 MED ORDER — LIDOCAINE VISCOUS HCL 2 % MT SOLN
15.0000 mL | Freq: Once | OROMUCOSAL | Status: AC
  Administered 2019-10-31: 15 mL via ORAL
  Filled 2019-10-31: qty 15

## 2019-10-31 MED ORDER — FAMOTIDINE IN NACL 20-0.9 MG/50ML-% IV SOLN
20.0000 mg | Freq: Once | INTRAVENOUS | Status: AC
  Administered 2019-10-31: 20 mg via INTRAVENOUS
  Filled 2019-10-31: qty 50

## 2019-10-31 MED ORDER — HYDROMORPHONE HCL 1 MG/ML IJ SOLN
1.0000 mg | INTRAMUSCULAR | Status: DC | PRN
  Administered 2019-10-31 – 2019-11-01 (×2): 1 mg via INTRAVENOUS
  Filled 2019-10-31 (×2): qty 1

## 2019-10-31 MED ORDER — LORAZEPAM 0.5 MG PO TABS: 0.5000 mg | ORAL_TABLET | Freq: Two times a day (BID) | ORAL | 0 refills | Status: DC | PRN

## 2019-10-31 MED ORDER — MORPHINE SULFATE (PF) 4 MG/ML IV SOLN
4.0000 mg | Freq: Once | INTRAVENOUS | Status: AC
  Administered 2019-10-31: 18:00:00 4 mg via INTRAVENOUS
  Filled 2019-10-31: qty 1

## 2019-10-31 MED ORDER — ONDANSETRON HCL 4 MG/2ML IJ SOLN
4.0000 mg | Freq: Once | INTRAMUSCULAR | Status: AC
  Administered 2019-10-31: 4 mg via INTRAVENOUS
  Filled 2019-10-31: qty 2

## 2019-10-31 MED ORDER — ONDANSETRON HCL 4 MG/2ML IJ SOLN
4.0000 mg | Freq: Four times a day (QID) | INTRAMUSCULAR | Status: DC | PRN
  Administered 2019-11-01: 4 mg via INTRAVENOUS
  Filled 2019-10-31: qty 2

## 2019-10-31 MED ORDER — IOHEXOL 300 MG/ML  SOLN
100.0000 mL | Freq: Once | INTRAMUSCULAR | Status: AC | PRN
  Administered 2019-10-31: 100 mL via INTRAVENOUS

## 2019-10-31 MED ORDER — SODIUM CHLORIDE 0.9 % IV SOLN
INTRAVENOUS | Status: DC
  Administered 2019-10-31: 75 mL/h via INTRAVENOUS

## 2019-10-31 MED ORDER — DIPHENHYDRAMINE HCL 50 MG/ML IJ SOLN
12.5000 mg | Freq: Four times a day (QID) | INTRAMUSCULAR | Status: DC | PRN
  Administered 2019-10-31 – 2019-11-01 (×2): 12.5 mg via INTRAVENOUS
  Filled 2019-10-31 (×3): qty 1

## 2019-10-31 MED ORDER — ENOXAPARIN SODIUM 40 MG/0.4ML ~~LOC~~ SOLN
40.0000 mg | SUBCUTANEOUS | Status: DC
  Administered 2019-10-31 – 2019-11-01 (×2): 40 mg via SUBCUTANEOUS
  Filled 2019-10-31 (×2): qty 0.4

## 2019-10-31 MED ORDER — SODIUM CHLORIDE 0.9 % IV BOLUS
500.0000 mL | Freq: Once | INTRAVENOUS | Status: AC
  Administered 2019-10-31: 500 mL via INTRAVENOUS

## 2019-10-31 MED ORDER — HYDRALAZINE HCL 20 MG/ML IJ SOLN: 10.0000 mg | INTRAMUSCULAR | Status: DC | PRN

## 2019-10-31 NOTE — ED Triage Notes (Signed)
Patient complains of abdominal pain with emesis since this morning. Patient has vomited 4 times today.

## 2019-10-31 NOTE — H&P (Signed)
TRH H&P    Patient Demographics:    Annette Saunders, is a 74 y.o. female  MRN: [id number]  DOB - 1946/02/16  Admit Date - 10/31/2019  Referring MD/NP/PA: Kennith Maes  Outpatient Primary MD for the patient is Doree Albee, MD  Patient coming from: Home  Chief complaint-abdominal pain, vomiting   HPI:    Annette Saunders  is a 74 y.o. female, with history of hypertension, hyperlipidemia, anxiety, GERD, hypothyroidism, prior hiatal hernia repair surgery 7 years ago came to the hospital with complaints of abdominal pain which started on 7 AM this morning.  Patient says that she ate cauliflower in garlic, all of oil with mozzarella cheese last night and this morning she felt bloated and started having epigastric pain 8/10 in intensity.  She went to her PCP office where he examined her and ask her to come to ED for further evaluation.  In the ED CT of the abdomen pelvis showed ileus versus early partial SBO.  Patient had a last BM around 1:30 PM which was normal.  She had 4 episodes of vomiting.  Denies any vomiting at this time.  No diarrhea.  No fever or chills.  Denies chest pain or shortness of breath.  General surgery was consulted by ED physician and admission to the hospital was recommended.  No NG tube to be placed at this time as per general surgery recommendation   Review of systems:    In addition to the HPI above,  .  All other systems reviewed and are negative.    Past History of the following :    Past Medical History:  Diagnosis Date  . Anxiety   . Essential hypertension, benign 06/06/2019  . Generalized osteoarthritis   . GERD (gastroesophageal reflux disease)   . Hyperlipidemia   . Hypothyroidism, adult 06/06/2019  . Insomnia   . Malaise and fatigue 06/06/2019  . Obesity (BMI 30.0-34.9) 06/06/2019      Past Surgical History:  Procedure Laterality Date  . COLONOSCOPY  10/2014    Dr. Cristine Polio, outside hospital. Colonoscopy performed with Propofol, no polyps, small to medium size internal hemorrhoids noted.   [name] ECTOPIC PREGNANCY SURGERY    . ELBOW FRACTURE SURGERY    . HIATAL HERNIA REPAIR    . JOINT REPLACEMENT     bilateral knee  . TONSILLECTOMY        Social History:      Social History   Tobacco Use  . Smoking status: Never Smoker  . Smokeless tobacco: Never Used  Substance Use Topics  . Alcohol use: No    Alcohol/week: 0.0 standard drinks       Family History :     Family History  Problem Relation Age of Onset  . Colon cancer Neg Hx       Home Medications:   Prior to Admission medications   Medication Sig Start Date End Date Taking? Authorizing Provider  Ascorbic Acid (VITAMIN C) 1000 MG tablet Take 1,000 mg by mouth daily.    [provider]  Cholecalciferol (  VITAMIN D) 125 MCG (5000 UT) CAPS Take 5,000 Units by mouth daily.     [provider]  dicyclomine (BENTYL) 10 MG capsule Take 1 capsule (10 mg total) by mouth 2 (two) times daily as needed. Patient taking differently: Take 10 mg by mouth 2 (two) times daily.  08/07/19   Erenest Rasher, PA-C  escitalopram (LEXAPRO) 10 MG tablet Take 1 tablet (10 mg total) by mouth daily. 10/16/19   Ailene Ards, NP  LORazepam (ATIVAN) 0.5 MG tablet Take 1 tablet (0.5 mg total) by mouth 2 (two) times daily as needed for anxiety. 10/31/19   Doree Albee, MD  pantoprazole (PROTONIX) 40 MG tablet TAKE 1 TABLET BY MOUTH TWICE DAILY 30 MINUTES BEFORE A MEAL Patient taking differently: Take 40 mg by mouth 2 (two) times daily before a meal.  09/30/19   Harper, Tivis Ringer, PA-C  progesterone (PROMETRIUM) 200 MG capsule TAKE 3 CAPSULES BY MOUTH EVERY NIGHT 10/07/19   Doree Albee, MD  thyroid (NP THYROID) 120 MG tablet Take 1 tablet (120 mg total) by mouth 2 (two) times daily. 09/09/19   Doree Albee, MD     Allergies:     Allergies  Allergen Reactions  . Dilantin [Phenytoin  Sodium Extended] Other (See Comments)    Hallucinations  . Doxycycline Hyclate     Blurry vision      Physical Exam:   Vitals  Blood pressure (!) 161/71, pulse 93, temperature 98 F (36.7 C), temperature source Oral, resp. rate 16, height 5\' 1"  (1.549 m), weight 90.6 kg, SpO2 97 %.  1.  General: Appears in no acute distress  2. Psychiatric: Alert, oriented x3, intact insight and judgment  3. Neurologic: Cranial nerves II through XII grossly intact, motor strength 5/5 in all extremities  4. HEENMT:  Atraumatic normocephalic, extraocular muscles are intact  5. Respiratory : Clear to auscultation bilaterally, no wheezing or crackles auscultated  6. Cardiovascular : S1-S2, regular, no murmur auscultated  7. Gastrointestinal:  Positive epigastric tenderness to palpation, no rigidity or guarding      Data Review:    CBC Recent Labs  Lab 10/31/19 1520  WBC 8.2  HGB 13.3  HCT 42.0  PLT 334  MCV 85.7  MCH 27.1  MCHC 31.7  RDW 12.9   ------------------------------------------------------------------------------------------------------------------  Results for orders placed or performed during the hospital encounter of 10/31/19 (from the past 48 hour(s))  Urinalysis, Routine w reflex microscopic     Status: Abnormal   Collection Time: 10/31/19  2:15 PM  Result Value Ref Range   Color, Urine YELLOW YELLOW   APPearance CLEAR CLEAR   Specific Gravity, Urine 1.018 1.005 - 1.030   pH 5.0 5.0 - 8.0   Glucose, UA NEGATIVE NEGATIVE mg/dL   Hgb urine dipstick NEGATIVE NEGATIVE   Bilirubin Urine NEGATIVE NEGATIVE   Ketones, ur NEGATIVE NEGATIVE mg/dL   Protein, ur NEGATIVE NEGATIVE mg/dL   Nitrite NEGATIVE NEGATIVE   Leukocytes,Ua MODERATE (A) NEGATIVE   RBC / HPF 0-5 0 - 5 RBC/hpf   WBC, UA 0-5 0 - 5 WBC/hpf   Bacteria, UA RARE (A) NONE SEEN   Squamous Epithelial / LPF 0-5 0 - 5   Mucus PRESENT     Comment: Performed at [facility], [address]  [place], [place] [address]  Lipase, blood     Status: None   Collection Time: 10/31/19  3:20 PM  Result Value Ref Range   Lipase 30 11 - 51  U/L    Comment: Performed at [facility], [address], [place], [place] [address]  Comprehensive metabolic panel     Status: Abnormal   Collection Time: 10/31/19  3:20 PM  Result Value Ref Range   Sodium 141 135 - 145 mmol/L   Potassium 4.1 3.5 - 5.1 mmol/L   Chloride 109 98 - 111 mmol/L   CO2 23 22 - 32 mmol/L   Glucose, Bld 112 (H) 70 - 99 mg/dL    Comment: Glucose reference range applies only to samples taken after fasting for at least 8 hours.   BUN 18 8 - 23 mg/dL   Creatinine, Ser 0.73 0.44 - 1.00 mg/dL   Calcium 10.5 (H) 8.9 - 10.3 mg/dL   Total Protein 7.7 6.5 - 8.1 g/dL   Albumin 3.8 3.5 - 5.0 g/dL   AST 22 15 - 41 U/L   ALT 21 0 - 44 U/L   Alkaline Phosphatase 76 38 - 126 U/L   Total Bilirubin 0.7 0.3 - 1.2 mg/dL   GFR calc non Af Amer >60 >60 mL/min   GFR calc Af Amer >60 >60 mL/min   Anion gap 9 5 - 15    Comment: Performed at [facility], [address] [place], [place] [address]  CBC     Status: None   Collection Time: 10/31/19  3:20 PM  Result Value Ref Range   WBC 8.2 4.0 - 10.5 K/uL   RBC 4.90 3.87 - 5.11 MIL/uL   Hemoglobin 13.3 12.0 - 15.0 g/dL   HCT 42.0 36.0 - 46.0 %   MCV 85.7 80.0 - 100.0 fL   MCH 27.1 26.0 - 34.0 pg   MCHC 31.7 30.0 - 36.0 g/dL   RDW 12.9 11.5 - 15.5 %   Platelets 334 150 - 400 K/uL   nRBC 0.0 0.0 - 0.2 %    Comment: Performed at [facility], [address], [place], [place] [address]    Chemistries  Recent Labs  Lab 10/31/19 1520  NA 141  K 4.1  CL 109  CO2 23  GLUCOSE 112*  BUN 18  CREATININE 0.73  CALCIUM 10.5*  AST 22  ALT 21  ALKPHOS 76  BILITOT 0.7   ------------------------------------------------------------------------------------------------------------------   ------------------------------------------------------------------------------------------------------------------ GFR: Estimated Creatinine Clearance: 64.2 mL/min (by C-G formula based on SCr of 0.73 mg/dL). Liver Function Tests: Recent Labs  Lab 10/31/19 1520  AST 22  ALT 21  ALKPHOS 76  BILITOT 0.7  PROT 7.7  ALBUMIN 3.8   Recent Labs  Lab 10/31/19 1520  LIPASE 30    --------------------------------------------------------------------------------------------------------------- Urine analysis:    Component Value Date/Time   COLORURINE YELLOW 10/31/2019 1415   APPEARANCEUR CLEAR 10/31/2019 1415   LABSPEC 1.018 10/31/2019 1415   PHURINE 5.0 10/31/2019 1415   GLUCOSEU NEGATIVE 10/31/2019 1415   HGBUR NEGATIVE 10/31/2019 1415   BILIRUBINUR NEGATIVE 10/31/2019 1415   KETONESUR NEGATIVE 10/31/2019 1415   PROTEINUR NEGATIVE 10/31/2019 1415   NITRITE NEGATIVE 10/31/2019 1415   LEUKOCYTESUR MODERATE (A) 10/31/2019 1415      Imaging Results:    CT Abdomen Pelvis W Contrast  Result Date: 10/31/2019 CLINICAL DATA:  74 year old female with abdominal pain. EXAM: CT ABDOMEN AND PELVIS WITH CONTRAST TECHNIQUE: Multidetector CT imaging of the abdomen and pelvis was performed using the standard protocol following bolus administration of intravenous contrast. CONTRAST:  172mL OMNIPAQUE IOHEXOL 300 MG/ML  SOLN COMPARISON:  CT abdomen pelvis dated 01/25/2019. FINDINGS: Lower chest: There is eventration of the left  hemidiaphragm. Left lung base scarring noted. The visualized right lung base are clear. No intra-abdominal free air or free fluid. Hepatobiliary: The liver is unremarkable. There is mild intrahepatic biliary ductal dilatation. The gallbladder is physiologically distended. No calcified gallstone or pericholecystic fluid. Pancreas: Unremarkable. No pancreatic ductal dilatation or surrounding inflammatory changes. Spleen: Normal in size without focal abnormality. Adrenals/Urinary Tract:  The adrenal glands are unremarkable. There is no hydronephrosis on either side. There is symmetric enhancement and excretion of contrast by both kidneys. The visualized ureters and urinary bladder appear unremarkable. Stomach/Bowel: There is sigmoid diverticulosis without active inflammatory changes. Postsurgical changes at the gastroesophageal junction noted. Mildly dilated jejunal loops measuring up to 3 cm with gradual tapering in the left upper abdomen. Findings may represent a partial/low grade obstruction or segmental ileus. Clinical correlation is recommended. The distal small bowel and the colon are collapsed. The appendix is normal. Vascular/Lymphatic: Mild aortoiliac atherosclerotic disease. The IVC is unremarkable. No portal venous gas. There is no adenopathy. Reproductive: The uterus is anteverted and grossly unremarkable. Bilateral ovarian or paraovarian cysts similar to prior CT. Old calcified granuloma in the pelvis. Other: None Musculoskeletal: Degenerative changes of the spine. Prior left lower rib resections. No acute osseous pathology. IMPRESSION: 1. Findings concerning for an early or partial/low grade proximal small bowel obstruction versus an ileus. Clinical correlation is recommended. 2. Sigmoid diverticulosis. 3. Aortic Atherosclerosis (ICD10-I70.0). Electronically Signed   By: Anner Crete M.D.   On: 10/31/2019 18:22      Assessment & Plan:    Active Problems:   Ileus ([place])   1. Ileus/partial SBO-CT of the abdomen pelvis shows ileus/partial SBO, will keep patient n.p.o.  She is not passing gas at this time but had BM this afternoon at 1:30 PM.  Will start IV normal saline at 75 mL/h.  Follow abdominal x-ray two-view in the morning.  General surgery will see patient in a.m.  Protonix 40 mg IV every 12 hour, Dilaudid 1 mg IV every 4 hours as needed for pain. 2. Hypertension-we will hold oral medications.  Start hydralazine 10 mg IV every 4 hours as needed for BP more than  160/100. 3. Hypothyroidism-we will hold NP thyroid at this time.  Hopefully we can restart her medications in a.m.  If she is going to be n.p.o. more than 24 hours, consider switching to IV Synthroid in a.m.      AM Labs Ordered, also please review Full Orders  Family Communication: Admission, patients condition and plan of care including tests being ordered have been discussed with the patient  who indicate understanding and agree with the plan and Code Status.  Code Status: Full code  Admission status: Observation/Inpatient :The appropriate admission status for this patient is INPATIENT. Inpatient status is judged to be reasonable and necessary in order to provide the required intensity of service to ensure the patient's safety. The patient's presenting symptoms, physical exam findings, and initial radiographic and laboratory data in the context of their chronic comorbidities is felt to place them at high risk for further clinical deterioration. Furthermore, it is not anticipated that the patient will be medically stable for discharge from the hospital within 2 midnights of admission. The following factors support the admission status of inpatient.     The patient's presenting symptoms include abdominal pain, vomiting. The worrisome physical exam findings include epigastric tenderness to palpation. The initial radiographic and laboratory data are worrisome because of abdominal x-ray showing ileus, early partial small bowel obstruction. The chronic co-morbidities  include morbid obesity.       * I certify that at the point of admission it is my clinical judgment that the patient will require inpatient hospital care spanning beyond 2 midnights from the point of admission due to high intensity of service, high risk for further deterioration and high frequency of surveillance required.*  Time spent in minutes : 60 minutes   [employer] [facility] Tashika Goodin M.D

## 2019-10-31 NOTE — ED Provider Notes (Signed)
[facility] EMERGENCY DEPARTMENT Provider Note   CSN: [id number] Arrival date & time: 10/31/19  1405     History Chief Complaint  Patient presents with  . Abdominal Pain    Annette Saunders is a 74 y.o. female with a hx of hypertension, hyperlipidemia, anxiety, GERD, hypothyroidism, & prior surgeries including hiatal hernia repair who presents to the ED with complaints of abdominal pain since 0700 this AM. Patient states she went to bed feeling normally, but then woke up this AM with non-radiating epigastric pain that is a constant 8/10 in severity pain, intermittent worsening to 10/10 in severity without specific alleviating/aggravating factors. Reports associated nausea, belching, & 4 episodes of vomiting. Unable to keep anything down. Last BM was @ 13:30 and was normal. Denies fever, chills, hematemesis, diarrhea, constipation, melena, dysuria, or dyspnea. Had cauliflower in garlic, cheese, & olive oil for dinner last night, had not had anything to eat today.   HPI     Past Medical History:  Diagnosis Date  . Anxiety   . Essential hypertension, benign 06/06/2019  . Generalized osteoarthritis   . GERD (gastroesophageal reflux disease)   . Hyperlipidemia   . Hypothyroidism, adult 06/06/2019  . Insomnia   . Malaise and fatigue 06/06/2019  . Obesity (BMI 30.0-34.9) 06/06/2019    Patient Active Problem List   Diagnosis Date Noted  . Shortness of breath 10/16/2019  . Encounter to discuss test results 10/16/2019  . Renal insufficiency 10/16/2019  . Healthcare maintenance 10/16/2019  . Otitis media 10/16/2019  . Breast pain 10/16/2019  . Lower abdominal pain 09/17/2019  . Essential hypertension, benign 06/06/2019  . Malaise and fatigue 06/06/2019  . Obesity (BMI 30.0-34.9) 06/06/2019  . Hypothyroidism, adult 06/06/2019  . Diarrhea 03/28/2019  . Pain due to total right knee replacement ([place]) 05/21/2018  . Dysphagia 12/14/2017  . GERD (gastroesophageal reflux disease) 10/27/2016      Past Surgical History:  Procedure Laterality Date  . COLONOSCOPY  10/2014   Dr. Cristine Polio, outside hospital. Colonoscopy performed with Propofol, no polyps, small to medium size internal hemorrhoids noted.   [name] ECTOPIC PREGNANCY SURGERY    . ELBOW FRACTURE SURGERY    . HIATAL HERNIA REPAIR    . JOINT REPLACEMENT     bilateral knee  . TONSILLECTOMY       OB History   No obstetric history on file.     Family History  Problem Relation Age of Onset  . Colon cancer Neg Hx     Social History   Tobacco Use  . Smoking status: Never Smoker  . Smokeless tobacco: Never Used  Substance Use Topics  . Alcohol use: No    Alcohol/week: 0.0 standard drinks  . Drug use: No    Home Medications Prior to Admission medications   Medication Sig Start Date End Date Taking? Authorizing Provider  Ascorbic Acid (VITAMIN C) 1000 MG tablet Take 1,000 mg by mouth daily.    [provider]  Cholecalciferol (VITAMIN D) 125 MCG (5000 UT) CAPS Take 5,000 Units by mouth daily.     [provider]  dicyclomine (BENTYL) 10 MG capsule Take 1 capsule (10 mg total) by mouth 2 (two) times daily as needed. Patient taking differently: Take 10 mg by mouth 2 (two) times daily.  08/07/19   Erenest Rasher, PA-C  escitalopram (LEXAPRO) 10 MG tablet Take 1 tablet (10 mg total) by mouth daily. 10/16/19   Ailene Ards, NP  LORazepam (ATIVAN) 0.5 MG tablet Take 1 tablet (  0.5 mg total) by mouth 2 (two) times daily as needed for anxiety. 10/31/19   Doree Albee, MD  pantoprazole (PROTONIX) 40 MG tablet TAKE 1 TABLET BY MOUTH TWICE DAILY 30 MINUTES BEFORE A MEAL 09/30/19   Aliene Altes S, PA-C  progesterone (PROMETRIUM) 200 MG capsule TAKE 3 CAPSULES BY MOUTH EVERY NIGHT 10/07/19   Doree Albee, MD  thyroid (NP THYROID) 120 MG tablet Take 1 tablet (120 mg total) by mouth 2 (two) times daily. 09/09/19   Doree Albee, MD    Allergies    Dilantin [phenytoin sodium extended] and Doxycycline  hyclate  Review of Systems   Review of Systems  Constitutional: Negative for chills and fever.  Respiratory: Negative for shortness of breath.   Cardiovascular: Negative for chest pain.  Gastrointestinal: Positive for abdominal pain, nausea and vomiting. Negative for anal bleeding, blood in stool, constipation and diarrhea.  Genitourinary: Negative for dysuria, vaginal bleeding and vaginal discharge.  Neurological: Negative for syncope, weakness and numbness.  All other systems reviewed and are negative.   Physical Exam Updated Vital Signs BP (!) 126/48 (BP Location: Right Arm)   Pulse (!) 110   Temp 98 F (36.7 C) (Oral)   Resp 16   Ht 5\' 1"  (1.549 m)   Wt 90.6 kg   SpO2 97%   BMI 37.75 kg/m   Physical Exam Vitals and nursing note reviewed.  Constitutional:      General: She is not in acute distress.    Appearance: She is well-developed. She is not toxic-appearing.  HENT:     Head: Normocephalic and atraumatic.  Eyes:     General:        Right eye: No discharge.        Left eye: No discharge.     Conjunctiva/sclera: Conjunctivae normal.  Cardiovascular:     Rate and Rhythm: Normal rate and regular rhythm.     Comments: 2+ symmetric radial & DP pulses.  Pulmonary:     Effort: Pulmonary effort is normal. No respiratory distress.     Breath sounds: Normal breath sounds. No wheezing, rhonchi or rales.  Abdominal:     General: There is no distension.     Palpations: Abdomen is soft.     Tenderness: There is abdominal tenderness in the epigastric area. There is no guarding or rebound.  Musculoskeletal:     Cervical back: Neck supple.  Skin:    General: Skin is warm and dry.     Findings: No rash.  Neurological:     Mental Status: She is alert.     Comments: Clear speech.   Psychiatric:        Behavior: Behavior normal.     ED Results / Procedures / Treatments   Labs (all labs ordered are listed, but only abnormal results are displayed) Labs Reviewed    COMPREHENSIVE METABOLIC PANEL - Abnormal; Notable for the following components:      Result Value   Glucose, Bld 112 (*)    Calcium 10.5 (*)    All other components within normal limits  URINALYSIS, ROUTINE W REFLEX MICROSCOPIC - Abnormal; Notable for the following components:   Leukocytes,Ua MODERATE (*)    Bacteria, UA RARE (*)    All other components within normal limits  URINE CULTURE  SARS CORONAVIRUS 2 (TAT 6-24 HRS)  LIPASE, BLOOD  CBC    EKG None  Radiology CT Abdomen Pelvis W Contrast  Result Date: 10/31/2019 CLINICAL DATA:  74 year old female  with abdominal pain. EXAM: CT ABDOMEN AND PELVIS WITH CONTRAST TECHNIQUE: Multidetector CT imaging of the abdomen and pelvis was performed using the standard protocol following bolus administration of intravenous contrast. CONTRAST:  173mL OMNIPAQUE IOHEXOL 300 MG/ML  SOLN COMPARISON:  CT abdomen pelvis dated 01/25/2019. FINDINGS: Lower chest: There is eventration of the left hemidiaphragm. Left lung base scarring noted. The visualized right lung base are clear. No intra-abdominal free air or free fluid. Hepatobiliary: The liver is unremarkable. There is mild intrahepatic biliary ductal dilatation. The gallbladder is physiologically distended. No calcified gallstone or pericholecystic fluid. Pancreas: Unremarkable. No pancreatic ductal dilatation or surrounding inflammatory changes. Spleen: Normal in size without focal abnormality. Adrenals/Urinary Tract: The adrenal glands are unremarkable. There is no hydronephrosis on either side. There is symmetric enhancement and excretion of contrast by both kidneys. The visualized ureters and urinary bladder appear unremarkable. Stomach/Bowel: There is sigmoid diverticulosis without active inflammatory changes. Postsurgical changes at the gastroesophageal junction noted. Mildly dilated jejunal loops measuring up to 3 cm with gradual tapering in the left upper abdomen. Findings may represent a partial/low  grade obstruction or segmental ileus. Clinical correlation is recommended. The distal small bowel and the colon are collapsed. The appendix is normal. Vascular/Lymphatic: Mild aortoiliac atherosclerotic disease. The IVC is unremarkable. No portal venous gas. There is no adenopathy. Reproductive: The uterus is anteverted and grossly unremarkable. Bilateral ovarian or paraovarian cysts similar to prior CT. Old calcified granuloma in the pelvis. Other: None Musculoskeletal: Degenerative changes of the spine. Prior left lower rib resections. No acute osseous pathology. IMPRESSION: 1. Findings concerning for an early or partial/low grade proximal small bowel obstruction versus an ileus. Clinical correlation is recommended. 2. Sigmoid diverticulosis. 3. Aortic Atherosclerosis (ICD10-I70.0). Electronically Signed   By: Anner Crete M.D.   On: 10/31/2019 18:22    Procedures Procedures (including critical care time)  Medications Ordered in ED Medications - No data to display  ED Course  I have reviewed the triage vital signs and the nursing notes.  Pertinent labs & imaging results that were available during my care of the patient were reviewed by me and considered in my medical decision making (see chart for details).  Clinical Course as of Oct 30 1841  Thu Oct 31, 2019  1722 Briefly this 74 year old female with a history of hiatal hernia that was repaired in the past, presented to the emergency department epigastric pain that she woke up with this morning.  Says she ate a meal of garlic and oil covered cauliflower yesterday night, and says,"I'm still belching from it, it burns in my chest."  She woke up this morning feeling nauseous and has not eaten but vomited a few times.  She reports epigastric discomfort.  She denies any similar symptoms in the past.  She denies any known history of gallstones or biliary colic.  She reports she is passing gas.  She denies any cardiac history, any chest pain or  pressure, any difficulty breathing.  She reports burning discomfort in epigastrium.  On exam she does have epigastric discomfort.  No real focal right upper quadrant tenderness.  Her labs are consistent with acute cholecystitis.  She has no leukocytosis.  She is pending a CT abdomen pelvis.  I suspect this is likely related to her hiatal hernia, or gastritis, or potentially an ulcer.  I have a much lower suspicion for atypical coronary syndrome given this presentation.  We will try to get a cocktail.   [MT]    Clinical Course User  Index [MT] Wyvonnia Dusky, MD   Annette Saunders was evaluated in Emergency Department on 10/31/2019 for the symptoms described in the history of present illness. He/she was evaluated in the context of the global COVID-19 pandemic, which necessitated consideration that the patient might be at risk for infection with the SARS-CoV-2 virus that causes COVID-19. Institutional protocols and algorithms that pertain to the evaluation of patients at risk for COVID-19 are in a state of rapid change based on information released by regulatory bodies including the CDC and federal and state organizations. These policies and algorithms were followed during the patient's care in the ED.  MDM Rules/Calculators/A&P                      Patient presents to the ED with complaints of epigastric abdominal pain with associated nausea and vomiting that began this AM.  Patient is nontoxic, in no apparent distress on initial assessment, vitals without significant abnormality, mild tachycardia improved on my exam.  He is tender to palpation in the epigastric region without peritoneal signs.  She has no chest pain, dyspnea, or exertional type symptoms, epigastric pain is reproducible, do not suspect cardiac etiology currently.  Plan for labs, CT abdomen/pelvis, and symptomatic control.  CBC: No leukocytosis or anemia. CMP: Mildly elevated calcium, receiving fluids.  Renal function preserved.  LFTs  WNL. Lipase: WNL Urinalysis: Moderate leukocytes, rare bacteria, no urinary symptoms, sent for culture.  CT A/P: 1. Findings concerning for an early or partial/low grade proximal small bowel obstruction versus an ileus. Clinical correlation is recommended. 2. Sigmoid diverticulosis. 3. Aortic Atherosclerosis  18:55: RE-EVAL: Patient had BM earlier today, initially had pain improvement s/p morphine, now pain is returning some- additional analgesics ordered. No active vomiting, do not feel patient requires immediate NG tube. Will discuss with general surgery.   19:08: CONSULT: [facility], recommends admit to medicine, will see in the AM.   19:25: CONSULT: Discussed with hospitalist Dr. Darrick Meigs- accepts admission.   Patient updated on results & plan of care & is in agreement.   Findings and plan of care discussed with supervising physician Dr. Langston Masker who has evaluated the patient & is in agreement.    Final Clinical Impression(s) / ED Diagnoses Final diagnoses:  Epigastric pain  Non-intractable vomiting with nausea, unspecified vomiting type    Rx / DC Orders ED Discharge Orders    None       Leafy Kindle 10/31/19 1929    Wyvonnia Dusky, MD 11/01/19 1325

## 2019-10-31 NOTE — Progress Notes (Signed)
Metrics: Intervention Frequency ACO  Documented Smoking Status Yearly  Screened one or more times in 24 months  Cessation Counseling or  Active cessation medication Past 24 months  Past 24 months   Guideline developer: UpToDate (See UpToDate for funding source) Date Released: 2014       Wellness Office Visit  Subjective:  Patient ID: Peightyn Rackow, female    DOB: 1945/11/02  Age: 74 y.o. MRN: [id number]  CC: Epigastric abdominal pain HPI  This lady comes in as an acute visit with epigastric abdominal pain which is intermittent and appears to be spasmodic.  It started this morning.  She had cauliflower yesterday and she thinks this is to blame.  She has not eaten anything this morning.  She has not taken her Protonix nor has she taken her dicyclomine.  She feels nauseous but there is no vomiting.  The epigastric pain does not radiate to her back.  She does not have any chest pain, dyspnea or sweating. Past Medical History:  Diagnosis Date  . Anxiety   . Essential hypertension, benign 06/06/2019  . Generalized osteoarthritis   . GERD (gastroesophageal reflux disease)   . Hyperlipidemia   . Hypothyroidism, adult 06/06/2019  . Insomnia   . Malaise and fatigue 06/06/2019  . Obesity (BMI 30.0-34.9) 06/06/2019      Family History  Problem Relation Age of Onset  . Colon cancer Neg Hx     Social History   Social History Narrative   Widow for 70 years-husband died of MI.Lives alone.   Social History   Tobacco Use  . Smoking status: Never Smoker  . Smokeless tobacco: Never Used  Substance Use Topics  . Alcohol use: No    Alcohol/week: 0.0 standard drinks    Current Meds  Medication Sig  . Ascorbic Acid (VITAMIN C) 1000 MG tablet Take 1,000 mg by mouth daily.  . Cholecalciferol (VITAMIN D) 125 MCG (5000 UT) CAPS Take 5,000 Units by mouth daily.   [name] dicyclomine (BENTYL) 10 MG capsule Take 1 capsule (10 mg total) by mouth 2 (two) times daily as needed. (Patient taking  differently: Take 10 mg by mouth 2 (two) times daily. )  . escitalopram (LEXAPRO) 10 MG tablet Take 1 tablet (10 mg total) by mouth daily.  . pantoprazole (PROTONIX) 40 MG tablet TAKE 1 TABLET BY MOUTH TWICE DAILY 30 MINUTES BEFORE A MEAL  . progesterone (PROMETRIUM) 200 MG capsule TAKE 3 CAPSULES BY MOUTH EVERY NIGHT  . thyroid (NP THYROID) 120 MG tablet Take 1 tablet (120 mg total) by mouth 2 (two) times daily.      Objective:   Today's Vitals: BP 115/80   Pulse 72   Temp 98.2 F (36.8 C)   Ht 5\' 1"  (1.549 m)   Wt 199 lb 12.8 oz (90.6 kg)   BMI 37.75 kg/m  Vitals with BMI 10/31/2019 10/22/2019 10/16/2019  Height 5\' 1"  (No Data) 5\' 0"   Weight 199 lbs 13 oz (No Data) 201 lbs 3 oz  BMI [id number]  Systolic [id number] (No Data) [id number]  Diastolic 80 (No Data) 79  Pulse 72 - 107     Physical Exam   She appears to be in pain.  She is hemodynamically stable.  Abdominal examination shows some tenderness in the epigastric area.  She does not appear to have an acute abdomen by my exam.    Assessment   1. Epigastric pain       Tests ordered No orders of the defined types  were placed in this encounter.    Plan: 1. The epigastric pain may be from gastritis or even a peptic ulcer.  Pancreatitis is much less likely based on her symptoms.  I have told her to go home and make sure she takes the Protonix this morning that she did not take as well as the dicyclomine. 2. In addition, I have prescribed for some Ativan in case this helps her to some degree to relieve muscle spasm and anxiety which she is clearly experiencing at this point in time. 3. I have told her that if none of these measures work and she continues to get the episodic/intermittent/spasmodic epigastric pain, she must go to the emergency room.   Meds ordered this encounter  Medications  . LORazepam (ATIVAN) 0.5 MG tablet    Sig: Take 1 tablet (0.5 mg total) by mouth 2 (two) times daily as needed for anxiety.    Dispense:   30 tablet    Refill:  0    Lamiah Marmol Luther Parody, MD

## 2019-11-01 ENCOUNTER — Observation Stay (HOSPITAL_COMMUNITY): Payer: Medicare Other

## 2019-11-01 DIAGNOSIS — K567 Ileus, unspecified: Principal | ICD-10-CM

## 2019-11-01 DIAGNOSIS — E039 Hypothyroidism, unspecified: Secondary | ICD-10-CM

## 2019-11-01 DIAGNOSIS — Z6837 Body mass index (BMI) 37.0-37.9, adult: Secondary | ICD-10-CM

## 2019-11-01 DIAGNOSIS — F418 Other specified anxiety disorders: Secondary | ICD-10-CM | POA: Diagnosis not present

## 2019-11-01 DIAGNOSIS — E6609 Other obesity due to excess calories: Secondary | ICD-10-CM | POA: Diagnosis not present

## 2019-11-01 DIAGNOSIS — R1013 Epigastric pain: Secondary | ICD-10-CM | POA: Diagnosis not present

## 2019-11-01 DIAGNOSIS — K56609 Unspecified intestinal obstruction, unspecified as to partial versus complete obstruction: Secondary | ICD-10-CM | POA: Diagnosis not present

## 2019-11-01 LAB — COMPREHENSIVE METABOLIC PANEL
ALT: 18 U/L (ref 0–44)
AST: 19 U/L (ref 15–41)
Albumin: 3.2 g/dL — ABNORMAL LOW (ref 3.5–5.0)
Alkaline Phosphatase: 64 U/L (ref 38–126)
Anion gap: 10 (ref 5–15)
BUN: 20 mg/dL (ref 8–23)
CO2: 20 mmol/L — ABNORMAL LOW (ref 22–32)
Calcium: 9.5 mg/dL (ref 8.9–10.3)
Chloride: 111 mmol/L (ref 98–111)
Creatinine, Ser: 0.78 mg/dL (ref 0.44–1.00)
GFR calc Af Amer: >60 mL/min (ref >60)
GFR calc non Af Amer: >60 mL/min (ref >60)
Glucose, Bld: 150 mg/dL — ABNORMAL HIGH (ref 70–99)
Potassium: 4.2 mmol/L (ref 3.5–5.1)
Sodium: 141 mmol/L (ref 135–145)
Total Bilirubin: 0.8 mg/dL (ref 0.3–1.2)
Total Protein: 6.6 g/dL (ref 6.5–8.1)

## 2019-11-01 LAB — CBC
HCT: 38.3 % (ref 36.0–46.0)
Hemoglobin: 12.1 g/dL (ref 12.0–15.0)
MCH: 27.1 pg (ref 26.0–34.0)
MCHC: 31.6 g/dL (ref 30.0–36.0)
MCV: 85.7 fL (ref 80.0–100.0)
Platelets: 293 10*3/uL (ref 150–400)
RBC: 4.47 MIL/uL (ref 3.87–5.11)
RDW: 13 % (ref 11.5–15.5)
WBC: 8.5 10*3/uL (ref 4.0–10.5)
nRBC: 0 % (ref 0.0–0.2)

## 2019-11-01 LAB — SARS CORONAVIRUS 2 (TAT 6-24 HRS): SARS Coronavirus 2: NEGATIVE

## 2019-11-01 MED ORDER — PROMETHAZINE HCL 25 MG/ML IJ SOLN
12.5000 mg | Freq: Once | INTRAMUSCULAR | Status: AC
  Administered 2019-11-01: 12.5 mg via INTRAVENOUS
  Filled 2019-11-01: qty 1

## 2019-11-01 MED ORDER — ZOLPIDEM TARTRATE 5 MG PO TABS
5.0000 mg | ORAL_TABLET | Freq: Once | ORAL | Status: AC
  Administered 2019-11-01: 5 mg via ORAL
  Filled 2019-11-01: qty 1

## 2019-11-01 MED ORDER — METOCLOPRAMIDE HCL 10 MG PO TABS
5.0000 mg | ORAL_TABLET | Freq: Three times a day (TID) | ORAL | Status: DC
  Administered 2019-11-01 – 2019-11-02 (×3): 5 mg via ORAL
  Filled 2019-11-01 (×4): qty 1

## 2019-11-01 MED ORDER — ALUM & MAG HYDROXIDE-SIMETH 200-200-20 MG/5ML PO SUSP
30.0000 mL | Freq: Four times a day (QID) | ORAL | Status: DC | PRN
  Administered 2019-11-01: 30 mL via ORAL
  Filled 2019-11-01: qty 30

## 2019-11-01 MED ORDER — THYROID 30 MG PO TABS
120.0000 mg | ORAL_TABLET | Freq: Two times a day (BID) | ORAL | Status: DC
  Administered 2019-11-01 – 2019-11-02 (×2): 120 mg via ORAL
  Filled 2019-11-01 (×2): qty 4

## 2019-11-01 MED ORDER — LIDOCAINE VISCOUS HCL 2 % MT SOLN
15.0000 mL | Freq: Once | OROMUCOSAL | Status: AC
  Administered 2019-11-01: 15 mL via OROMUCOSAL
  Filled 2019-11-01: qty 15

## 2019-11-01 MED ORDER — ALUM & MAG HYDROXIDE-SIMETH 200-200-20 MG/5ML PO SUSP
30.0000 mL | Freq: Once | ORAL | Status: AC
  Administered 2019-11-01: 30 mL via ORAL
  Filled 2019-11-01: qty 30

## 2019-11-01 MED ORDER — ESCITALOPRAM OXALATE 10 MG PO TABS
10.0000 mg | ORAL_TABLET | Freq: Every day | ORAL | Status: DC
  Filled 2019-11-01: qty 1

## 2019-11-01 NOTE — Consult Note (Signed)
Reason for Consult: Small bowel obstruction Referring Physician: Dr. Elroy Channel Annette Saunders is an 74 y.o. female.  HPI: Annette Saunders is a 74 y.o. female with a past medical Hx significant for GERD and prior surgeries including hiatal hernia repair and ectopic pregnancy removal came into the hospital yesterday morning with acute, non-radiating epigastric pain. Patient reports the pain is a 8/10 intensity and is alleviated post-emesis. Patient reports she has been unable to keep anything down and has not had anything to eat yesterday or today. Her last meal was cauliflower baked in garlic, cheese, and soaked in olive oil. Patient"s last BM was @ 13:30 yesterday without diarrhea or hematochezia. Patient reports nausea and vomiting. Patient denies fevers, chills, or dysuria.  Past Medical History:  Diagnosis Date  . Anxiety   . Essential hypertension, benign 06/06/2019  . Generalized osteoarthritis   . GERD (gastroesophageal reflux disease)   . Hyperlipidemia   . Hypothyroidism, adult 06/06/2019  . Insomnia   . Malaise and fatigue 06/06/2019  . Obesity (BMI 30.0-34.9) 06/06/2019    Past Surgical History:  Procedure Laterality Date  . COLONOSCOPY  10/2014   Dr. Cristine Polio, outside hospital. Colonoscopy performed with Propofol, no polyps, small to medium size internal hemorrhoids noted.   [name] ECTOPIC PREGNANCY SURGERY    . ELBOW FRACTURE SURGERY    . HIATAL HERNIA REPAIR    . JOINT REPLACEMENT     bilateral knee  . TONSILLECTOMY      Family History  Problem Relation Age of Onset  . Colon cancer Neg Hx     Social History:  reports that she has never smoked. She has never used smokeless tobacco. She reports that she does not drink alcohol or use drugs.  Allergies:  Allergies  Allergen Reactions  . Dilantin [Phenytoin Sodium Extended] Other (See Comments)    Hallucinations  . Doxycycline Hyclate     Blurry vision     Medications:  Scheduled: . enoxaparin (LOVENOX) injection  40 mg  Subcutaneous Q24H  . pantoprazole (PROTONIX) IV  40 mg Intravenous Q12H    Results for orders placed or performed during the hospital encounter of 10/31/19 (from the past 48 hour(s))  Urinalysis, Routine w reflex microscopic     Status: Abnormal   Collection Time: 10/31/19  2:15 PM  Result Value Ref Range   Color, Urine YELLOW YELLOW   APPearance CLEAR CLEAR   Specific Gravity, Urine 1.018 1.005 - 1.030   pH 5.0 5.0 - 8.0   Glucose, UA NEGATIVE NEGATIVE mg/dL   Hgb urine dipstick NEGATIVE NEGATIVE   Bilirubin Urine NEGATIVE NEGATIVE   Ketones, ur NEGATIVE NEGATIVE mg/dL   Protein, ur NEGATIVE NEGATIVE mg/dL   Nitrite NEGATIVE NEGATIVE   Leukocytes,Ua MODERATE (A) NEGATIVE   RBC / HPF 0-5 0 - 5 RBC/hpf   WBC, UA 0-5 0 - 5 WBC/hpf   Bacteria, UA RARE (A) NONE SEEN   Squamous Epithelial / LPF 0-5 0 - 5   Mucus PRESENT     Comment: Performed at [facility], [address] [place], [place] [address]  Lipase, blood     Status: None   Collection Time: 10/31/19  3:20 PM  Result Value Ref Range   Lipase 30 11 - 51 U/L    Comment: Performed at [facility], [address] [place], [place] [address]  Comprehensive metabolic panel     Status: Abnormal   Collection Time: 10/31/19  3:20 PM  Result Value Ref Range   Sodium 141  135 - 145 mmol/L   Potassium 4.1 3.5 - 5.1 mmol/L   Chloride 109 98 - 111 mmol/L   CO2 23 22 - 32 mmol/L   Glucose, Bld 112 (H) 70 - 99 mg/dL    Comment: Glucose reference range applies only to samples taken after fasting for at least 8 hours.   BUN 18 8 - 23 mg/dL   Creatinine, Ser 0.73 0.44 - 1.00 mg/dL   Calcium 10.5 (H) 8.9 - 10.3 mg/dL   Total Protein 7.7 6.5 - 8.1 g/dL   Albumin 3.8 3.5 - 5.0 g/dL   AST 22 15 - 41 U/L   ALT 21 0 - 44 U/L   Alkaline Phosphatase 76 38 - 126 U/L   Total Bilirubin 0.7 0.3 - 1.2 mg/dL   GFR calc non Af Amer >60 >60 mL/min   GFR calc Af Amer >60 >60 mL/min   Anion gap 9 5 - 15    Comment: Performed at [facility],  [address], [place], [place] [address]  CBC     Status: None   Collection Time: 10/31/19  3:20 PM  Result Value Ref Range   WBC 8.2 4.0 - 10.5 K/uL   RBC 4.90 3.87 - 5.11 MIL/uL   Hemoglobin 13.3 12.0 - 15.0 g/dL   HCT 42.0 36.0 - 46.0 %   MCV 85.7 80.0 - 100.0 fL   MCH 27.1 26.0 - 34.0 pg   MCHC 31.7 30.0 - 36.0 g/dL   RDW 12.9 11.5 - 15.5 %   Platelets 334 150 - 400 K/uL   nRBC 0.0 0.0 - 0.2 %    Comment: Performed at [facility], [address], [place], [place] [address]  CBC     Status: None   Collection Time: 11/01/19  4:30 AM  Result Value Ref Range   WBC 8.5 4.0 - 10.5 K/uL   RBC 4.47 3.87 - 5.11 MIL/uL   Hemoglobin 12.1 12.0 - 15.0 g/dL   HCT 38.3 36.0 - 46.0 %   MCV 85.7 80.0 - 100.0 fL   MCH 27.1 26.0 - 34.0 pg   MCHC 31.6 30.0 - 36.0 g/dL   RDW 13.0 11.5 - 15.5 %   Platelets 293 150 - 400 K/uL   nRBC 0.0 0.0 - 0.2 %    Comment: Performed at [facility], [address] [place], [place] [address]  Comprehensive metabolic panel     Status: Abnormal   Collection Time: 11/01/19  4:30 AM  Result Value Ref Range   Sodium 141 135 - 145 mmol/L   Potassium 4.2 3.5 - 5.1 mmol/L   Chloride 111 98 - 111 mmol/L   CO2 20 (L) 22 - 32 mmol/L   Glucose, Bld 150 (H) 70 - 99 mg/dL    Comment: Glucose reference range applies only to samples taken after fasting for at least 8 hours.   BUN 20 8 - 23 mg/dL   Creatinine, Ser 0.78 0.44 - 1.00 mg/dL   Calcium 9.5 8.9 - 10.3 mg/dL   Total Protein 6.6 6.5 - 8.1 g/dL   Albumin 3.2 (L) 3.5 - 5.0 g/dL   AST 19 15 - 41 U/L   ALT 18 0 - 44 U/L   Alkaline Phosphatase 64 38 - 126 U/L   Total Bilirubin 0.8 0.3 - 1.2 mg/dL   GFR calc non Af Amer >60 >60 mL/min   GFR calc Af Amer >60 >60 mL/min   Anion gap 10 5 - 15  Comment: Performed at [facility], [address] [place], [place] [address]    CT Abdomen Pelvis W Contrast  Result Date: 10/31/2019 CLINICAL DATA:  74 year old female with abdominal pain. EXAM: CT ABDOMEN AND PELVIS WITH  CONTRAST TECHNIQUE: Multidetector CT imaging of the abdomen and pelvis was performed using the standard protocol following bolus administration of intravenous contrast. CONTRAST:  148mL OMNIPAQUE IOHEXOL 300 MG/ML  SOLN COMPARISON:  CT abdomen pelvis dated 01/25/2019. FINDINGS: Lower chest: There is eventration of the left hemidiaphragm. Left lung base scarring noted. The visualized right lung base are clear. No intra-abdominal free air or free fluid. Hepatobiliary: The liver is unremarkable. There is mild intrahepatic biliary ductal dilatation. The gallbladder is physiologically distended. No calcified gallstone or pericholecystic fluid. Pancreas: Unremarkable. No pancreatic ductal dilatation or surrounding inflammatory changes. Spleen: Normal in size without focal abnormality. Adrenals/Urinary Tract: The adrenal glands are unremarkable. There is no hydronephrosis on either side. There is symmetric enhancement and excretion of contrast by both kidneys. The visualized ureters and urinary bladder appear unremarkable. Stomach/Bowel: There is sigmoid diverticulosis without active inflammatory changes. Postsurgical changes at the gastroesophageal junction noted. Mildly dilated jejunal loops measuring up to 3 cm with gradual tapering in the left upper abdomen. Findings may represent a partial/low grade obstruction or segmental ileus. Clinical correlation is recommended. The distal small bowel and the colon are collapsed. The appendix is normal. Vascular/Lymphatic: Mild aortoiliac atherosclerotic disease. The IVC is unremarkable. No portal venous gas. There is no adenopathy. Reproductive: The uterus is anteverted and grossly unremarkable. Bilateral ovarian or paraovarian cysts similar to prior CT. Old calcified granuloma in the pelvis. Other: None Musculoskeletal: Degenerative changes of the spine. Prior left lower rib resections. No acute osseous pathology. IMPRESSION: 1. Findings concerning for an early or partial/low  grade proximal small bowel obstruction versus an ileus. Clinical correlation is recommended. 2. Sigmoid diverticulosis. 3. Aortic Atherosclerosis (ICD10-I70.0). Electronically Signed   By: Anner Crete M.D.   On: 10/31/2019 18:22    ROS:  Pertinent items are noted in HPI.  Blood pressure (!) 158/97, pulse (!) 106, temperature 97.9 F (36.6 C), temperature source Oral, resp. rate 18, height 5\' 1"  (1.549 m), weight 90.3 kg, SpO2 93 %. Physical Exam: WD/WN White female in NAD. HEENT: Normocephalic, atraumatic. Lungs: CTA with equal breath sounds bilaterally. Heart: RRR, without S3, S4, murmurs. Abdomen: Soft, non-distended, tender to palpation in epigastric and LUQ, but no rigidity. No HSM. No palpable hernias.  CT Scan images personally reviewed.  Assessment/Plan: Impression: Probable gastritis causing ileus. Doubt mechanical bowel obstruction at this time.  Plan: Continue PPI. May advance diet as tolerated.  Fredderick Severance 11/01/2019, 7:29 AM

## 2019-11-01 NOTE — Progress Notes (Signed)
17 - Dr. Darrick Meigs called pertaining to patient sounding wet and having a cough. Lung fields clear at this time. Patient tachypnic. Order to discontinue fluids at this time. Patient placed on 2L oxygen via [place].  0543 - While collecting vital signs, patient began to vomit excessively and shaking. 700 cc of green vomitus in emesis bag at this time. Dr. Darrick Meigs notified of vomiting. Order for phenergan 12.5 mg IV once with readback.   0555 - patient continues to dry hives and continues to feel "a bubble" in her throat. Will continue to monitor patient.

## 2019-11-01 NOTE — Care Management Obs Status (Signed)
[facility] NOTIFICATION   Patient Details  Name: Annette Saunders MRN: [id number] Date of Birth: 1945-11-19   Medicare Observation Status Notification Given:  Yes    Tommy Medal 11/01/2019, 3:37 PM

## 2019-11-01 NOTE — Progress Notes (Signed)
PROGRESS NOTE    Annette Saunders  [id number] DOB: Nov 12, 1945 DOA: 10/31/2019 PCP: Doree Albee, MD     Brief Narrative:  As per H&P written by Dr. Darrick Meigs on 10/31/19 74 y.o. female, with history of hypertension, hyperlipidemia, anxiety, GERD, hypothyroidism, prior hiatal hernia repair surgery 7 years ago came to the hospital with complaints of abdominal pain which started on 7 AM this morning.  Patient says that she ate cauliflower in garlic, all of oil with mozzarella cheese last night and this morning she felt bloated and started having epigastric pain 8/10 in intensity.  She went to her PCP office where he examined her and ask her to come to ED for further evaluation.  In the ED CT of the abdomen pelvis showed ileus versus early partial SBO.  Patient had a last BM around 1:30 PM which was normal.  She had 4 episodes of vomiting.  Denies any vomiting at this time.  No diarrhea.  No fever or chills.  Denies chest pain or shortness of breath.  General surgery was consulted by ED physician and admission to the hospital was recommended.  No NG tube to be placed at this time as per general surgery recommendation.  Assessment & Plan: 1-Ileus/partial SBO vs gastroparesis  -no further vomiting, even still having mild nausea -continue complaining of mid abd pain intermittently (burning in nature, 6/10 in intensity when present, radiating up ito mid chest) -continue conservative management -appreciate general surgery guidance -slowly advance diet -Had BM overnight -Reglan X 4 doses  2-HTN -fairly controlled in the setting of pain. -continue PRN hydralazine -oral antihypertensives agent on hold for now  3-hypothyroidism  -continue thyroid med in am  4-GERD -continue PPI and PRN maalox.  5-class 2 obesity -Body mass index is 37.62 kg/m. -low calorie diet, portion control and increase physical activity discussed with patient.  6-depression/anxiety -when fully tolerating PO's will  resume lexapro and ativan  DVT prophylaxis: Lovenox Code Status: Full Code Family Communication: no family at bedside. Disposition Plan: continue supportive care, advance idet slowly as tolerated, X4 doses of reglan and follow electrolytes. Hopefully home in the next 24 hours.  Consultants:   General surgery  Procedures:   See below for x-ray reports.  Antimicrobials:  Anti-infectives (From admission, onward)   None       Subjective: Afebrile, no CP, no vomiting. Still with intermittent and mild nausea; reports having BM overnight.  Objective: Vitals:   10/31/19 2159 11/01/19 0500 11/01/19 0829 11/01/19 1412  BP: 137/70 (!) 158/97  102/79  Pulse: (!) 104 (!) 106  (!) 119  Resp: 16 18  20   Temp: 98.4 F (36.9 C) 97.9 F (36.6 C)    TempSrc: Oral Oral    SpO2: 92% 93% (!) 85% 93%  Weight: 90.3 kg     Height: 5\' 1"  (1.549 m)       Intake/Output Summary (Last 24 hours) at 11/01/2019 2049 Last data filed at 11/01/2019 1300 Gross per 24 hour  Intake 480 ml  Output 400 ml  Net 80 ml   Filed Weights   10/31/19 1413 10/31/19 2159  Weight: 90.6 kg 90.3 kg    Examination:  General exam: Alert, awake, oriented x 3; reports dry mouth, expressed to be hungry and only intermittent mid epigastric pain; no vomiting and had BM overnight. Respiratory system: Clear to auscultation. Respiratory effort normal. Cardiovascular system:RRR. No murmurs, rubs, gallops. Gastrointestinal system: Abdomen is obese, nondistended, soft and mildly tender to deep palpation in the  mid section. No organomegaly or masses felt. Normal bowel sounds heard. Central nervous system: Alert and oriented. No focal neurological deficits. Extremities: No C/C/E, +pedal pulses Skin: No rashes, no petechiae. Psychiatry: Judgement and insight appear normal. Mood & affect appropriate.     Data Reviewed: I have personally reviewed following labs and imaging studies  CBC: Recent Labs  Lab 10/31/19 1520  11/01/19 0430  WBC 8.2 8.5  HGB 13.3 12.1  HCT 42.0 38.3  MCV 85.7 85.7  PLT 334 0000000   Basic Metabolic Panel: Recent Labs  Lab 10/31/19 1520 11/01/19 0430  NA 141 141  K 4.1 4.2  CL 109 111  CO2 23 20*  GLUCOSE 112* 150*  BUN 18 20  CREATININE 0.73 0.78  CALCIUM 10.5* 9.5   GFR: Estimated Creatinine Clearance: 64.1 mL/min (by C-G formula based on SCr of 0.78 mg/dL).   Liver Function Tests: Recent Labs  Lab 10/31/19 1520 11/01/19 0430  AST 22 19  ALT 21 18  ALKPHOS 76 64  BILITOT 0.7 0.8  PROT 7.7 6.6  ALBUMIN 3.8 3.2*   Recent Labs  Lab 10/31/19 1520  LIPASE 30   Urine analysis:    Component Value Date/Time   COLORURINE YELLOW 10/31/2019 1415   APPEARANCEUR CLEAR 10/31/2019 1415   LABSPEC 1.018 10/31/2019 1415   PHURINE 5.0 10/31/2019 1415   GLUCOSEU NEGATIVE 10/31/2019 1415   HGBUR NEGATIVE 10/31/2019 1415   BILIRUBINUR NEGATIVE 10/31/2019 1415   KETONESUR NEGATIVE 10/31/2019 1415   PROTEINUR NEGATIVE 10/31/2019 1415   NITRITE NEGATIVE 10/31/2019 1415   LEUKOCYTESUR MODERATE (A) 10/31/2019 1415    Recent Results (from the past 240 hour(s))  SARS CORONAVIRUS 2 (TAT 6-24 HRS) Nasopharyngeal Nasopharyngeal Swab     Status: None   Collection Time: 10/31/19  7:10 PM   Specimen: Nasopharyngeal Swab  Result Value Ref Range Status   SARS Coronavirus 2 NEGATIVE NEGATIVE Final    Comment: (NOTE) SARS-CoV-2 target nucleic acids are NOT DETECTED. The SARS-CoV-2 RNA is generally detectable in upper and lower respiratory specimens during the acute phase of infection. Negative results do not preclude SARS-CoV-2 infection, do not rule out co-infections with other pathogens, and should not be used as the sole basis for treatment or other patient management decisions. Negative results must be combined with clinical observations, patient history, and epidemiological information. The expected result is Negative. Fact Sheet for  Patients: SugarRoll.be Fact Sheet for Healthcare Providers: https://www.woods-mathews.com/ This test is not yet approved or cleared by the [place] FDA and  has been authorized for detection and/or diagnosis of SARS-CoV-2 by FDA under an Emergency Use Authorization (EUA). This EUA will remain  in effect (meaning this test can be used) for the duration of the COVID-19 declaration under Section 56 4(b)(1) of the Act, 21 U.S.C. section 360bbb-3(b)(1), unless the authorization is terminated or revoked sooner. Performed at [facility], [place] [address], [place], [place] [address]      Radiology Studies: CT Abdomen Pelvis W Contrast  Result Date: 10/31/2019 CLINICAL DATA:  74 year old female with abdominal pain. EXAM: CT ABDOMEN AND PELVIS WITH CONTRAST TECHNIQUE: Multidetector CT imaging of the abdomen and pelvis was performed using the standard protocol following bolus administration of intravenous contrast. CONTRAST:  163mL OMNIPAQUE IOHEXOL 300 MG/ML  SOLN COMPARISON:  CT abdomen pelvis dated 01/25/2019. FINDINGS: Lower chest: There is eventration of the left hemidiaphragm. Left lung base scarring noted. The visualized right lung base are clear. No intra-abdominal free air or free fluid. Hepatobiliary:  The liver is unremarkable. There is mild intrahepatic biliary ductal dilatation. The gallbladder is physiologically distended. No calcified gallstone or pericholecystic fluid. Pancreas: Unremarkable. No pancreatic ductal dilatation or surrounding inflammatory changes. Spleen: Normal in size without focal abnormality. Adrenals/Urinary Tract: The adrenal glands are unremarkable. There is no hydronephrosis on either side. There is symmetric enhancement and excretion of contrast by both kidneys. The visualized ureters and urinary bladder appear unremarkable. Stomach/Bowel: There is sigmoid diverticulosis without active inflammatory changes. Postsurgical  changes at the gastroesophageal junction noted. Mildly dilated jejunal loops measuring up to 3 cm with gradual tapering in the left upper abdomen. Findings may represent a partial/low grade obstruction or segmental ileus. Clinical correlation is recommended. The distal small bowel and the colon are collapsed. The appendix is normal. Vascular/Lymphatic: Mild aortoiliac atherosclerotic disease. The IVC is unremarkable. No portal venous gas. There is no adenopathy. Reproductive: The uterus is anteverted and grossly unremarkable. Bilateral ovarian or paraovarian cysts similar to prior CT. Old calcified granuloma in the pelvis. Other: None Musculoskeletal: Degenerative changes of the spine. Prior left lower rib resections. No acute osseous pathology. IMPRESSION: 1. Findings concerning for an early or partial/low grade proximal small bowel obstruction versus an ileus. Clinical correlation is recommended. 2. Sigmoid diverticulosis. 3. Aortic Atherosclerosis (ICD10-I70.0). Electronically Signed   By: Anner Crete M.D.   On: 10/31/2019 18:22   DG Abd 2 Views  Result Date: 11/01/2019 CLINICAL DATA:  Small-bowel obstruction EXAM: ABDOMEN - 2 VIEW COMPARISON:  None. FINDINGS: Bowel gas pattern is unremarkable with relative paucity of bowel gas. There is residual contrast within the bladder. No free air. No air-fluid levels. Surgical clips in the epigastric region. IMPRESSION: No evidence of bowel obstruction. Electronically Signed   By: Macy Mis M.D.   On: 11/01/2019 08:57    Scheduled Meds: . enoxaparin (LOVENOX) injection  40 mg Subcutaneous Q24H  . metoCLOPramide  5 mg Oral TID AC  . pantoprazole (PROTONIX) IV  40 mg Intravenous Q12H   Continuous Infusions:   LOS: 0 days    Time spent: 30 minutes.    Barton Dubois, MD [facility] Pager [phone]   11/01/2019, 8:49 PM

## 2019-11-02 DIAGNOSIS — Z6837 Body mass index (BMI) 37.0-37.9, adult: Secondary | ICD-10-CM | POA: Diagnosis not present

## 2019-11-02 DIAGNOSIS — R112 Nausea with vomiting, unspecified: Secondary | ICD-10-CM

## 2019-11-02 DIAGNOSIS — R1110 Vomiting, unspecified: Secondary | ICD-10-CM

## 2019-11-02 DIAGNOSIS — E6609 Other obesity due to excess calories: Secondary | ICD-10-CM | POA: Diagnosis not present

## 2019-11-02 DIAGNOSIS — R1013 Epigastric pain: Secondary | ICD-10-CM

## 2019-11-02 DIAGNOSIS — K219 Gastro-esophageal reflux disease without esophagitis: Secondary | ICD-10-CM | POA: Diagnosis not present

## 2019-11-02 DIAGNOSIS — K56609 Unspecified intestinal obstruction, unspecified as to partial versus complete obstruction: Secondary | ICD-10-CM | POA: Diagnosis not present

## 2019-11-02 DIAGNOSIS — K567 Ileus, unspecified: Secondary | ICD-10-CM | POA: Diagnosis not present

## 2019-11-02 LAB — URINE CULTURE: Culture: 30000 — AB

## 2019-11-02 MED ORDER — GUAIFENESIN-DM 100-10 MG/5ML PO SYRP
5.0000 mL | ORAL_SOLUTION | ORAL | Status: DC | PRN
  Administered 2019-11-02: 5 mL via ORAL
  Filled 2019-11-02: qty 5

## 2019-11-02 MED ORDER — FAMOTIDINE 20 MG PO TABS: 20.0000 mg | ORAL_TABLET | Freq: Every day | ORAL | 1 refills | Status: DC

## 2019-11-02 MED ORDER — SENNOSIDES-DOCUSATE SODIUM 8.6-50 MG PO TABS: 1.0000 | ORAL_TABLET | Freq: Every evening | ORAL | 0 refills | Status: DC | PRN

## 2019-11-02 NOTE — Discharge Summary (Signed)
Physician Discharge Summary  Annette Saunders [id number] DOB: 15-Mar-1946 DOA: 10/31/2019  PCP: Doree Albee, MD  Admit date: 10/31/2019 Discharge date: 11/02/2019  Time spent: 35 minutes  Recommendations for Outpatient Follow-up:  1. Repeat basic metabolic panel to follow across renal function   Discharge Diagnoses:  Active Problems:   Ileus (HCC)   Epigastric pain   Non-intractable vomiting   SBO (small bowel obstruction) (HCC)   Class 2 obesity due to excess calories with body mass index (BMI) of 37.0 to 37.9 in adult   Discharge Condition: Stable and improved.  Patient discharged home with instructions to follow-up with PCP in 2 days.  Code status: full code.  Diet recommendation: Heart healthy/low calorie diet.  Filed Weights   10/31/19 1413 10/31/19 2159  Weight: 90.6 kg 90.3 kg    History of present illness:  As per H&P written by Dr. Darrick Meigs on 10/31/19 74 y.o.female,with history of hypertension, hyperlipidemia, anxiety, GERD, hypothyroidism, prior hiatal hernia repair surgery 7 years ago came to the hospital with complaints of abdominal pain which started on 7 AM this morning. Patient says that she ate cauliflower in garlic, all of oil with mozzarella cheese last night and this morning she felt bloated and started having epigastric pain 8/10 in intensity. She went to her PCP office where he examined her and ask her to come to ED for further evaluation. In the ED CT of the abdomen pelvis showed ileus versus early partial SBO. Patient had a last BM around 1:30 PM which was normal. She had 4 episodes of vomiting. Denies any vomiting at this time. No diarrhea. No fever or chills. Denies chest pain or shortness of breath.  General surgery was consulted by ED physician and admission to the hospital was recommended. No NG tube to be placed at this time as per general surgery recommendation.  Hospital Course:  1-Ileus/partial SBO  -no further vomiting or  nausea -Significant improvement in her abdominal discomfort and tolerating diet at time of discharge. -Patient is passing gas and moving her bowels.  Discharged on Senokot -Outpatient follow-up with PCP to reassess electrolytes with a repeat basic metabolic panel. -Advised to keep yourself well-hydrated and to continue slowly advancing diet into a soft diet. -No surgical intervention needed; appreciate evaluation, assistance and recommendations by general surgery.  2-HTN -fairly controlled in the setting of pain. -Resume home oral antihypertensive regimen at discharge. -Advised to follow heart healthy diet.    3-hypothyroidism  -continue thyroid medication.  4-GERD -continue PPI and nightly Pepcid.  5-class 2 obesity -Body mass index is 37.62 kg/m. -low calorie diet, portion control and increase physical activity discussed with patient.  6-depression/anxiety -No suicidal ideation or hallucination -Continue Lexapro and Ativan.   Procedures: See below for x-ray reports  Consultations:  General surgery.  Discharge Exam: Vitals:   11/02/19 0519 11/02/19 1437  BP: 111/63 116/88  Pulse: 88 97  Resp: 16   Temp: 98.2 F (36.8 C)   SpO2: 97% 90%    General exam: Alert, awake, oriented x 3; reports feeling much better; no further episode of nausea vomiting.  Still with some mild midepigastric pain but significantly improved and expressed moving her bowels.  She has tolerated diet and is ready to go home.   Respiratory system: Clear to auscultation. Respiratory effort normal. Cardiovascular system:RRR. No murmurs, rubs, gallops. Gastrointestinal system: Abdomen is obese, nondistended, soft and mildly tender to deep palpation in the mid section. No organomegaly or masses felt. Normal bowel sounds heard. Central  nervous system: Alert and oriented. No focal neurological deficits. Extremities: No C/C/E, +pedal pulses Skin: No rashes, no petechiae. Psychiatry: Judgement  and insight appear normal. Mood & affect appropriate.    Discharge Instructions   Discharge Instructions    Diet - low sodium heart healthy   Complete by: As directed    Discharge instructions   Complete by: As directed    Take medications as prescribed. Follow heart healthy/soft diet Maintain adequate hydration Arrange follow-up with PCP in 10 days.   Increase activity slowly   Complete by: As directed      Allergies as of 11/02/2019      Reactions   Dilantin [phenytoin Sodium Extended] Other (See Comments)   Hallucinations   Doxycycline Hyclate    Blurry vision       Medication List    TAKE these medications   dicyclomine 10 MG capsule Commonly known as: BENTYL Take 1 capsule (10 mg total) by mouth 2 (two) times daily as needed. What changed: when to take this   ELDERBERRY PO Take 1 tablet by mouth daily.   escitalopram 10 MG tablet Commonly known as: LEXAPRO Take 1 tablet (10 mg total) by mouth daily.   famotidine 20 MG tablet Commonly known as: Pepcid Take 1 tablet (20 mg total) by mouth at bedtime.   pantoprazole 40 MG tablet Commonly known as: PROTONIX TAKE 1 TABLET BY MOUTH TWICE DAILY 30 MINUTES BEFORE A MEAL What changed: See the new instructions.   progesterone 200 MG capsule Commonly known as: PROMETRIUM TAKE 3 CAPSULES BY MOUTH EVERY NIGHT What changed: See the new instructions.   senna-docusate 8.6-50 MG tablet Commonly known as: Senokot-S Take 1 tablet by mouth at bedtime as needed for mild constipation.   thyroid 120 MG tablet Commonly known as: NP Thyroid Take 1 tablet (120 mg total) by mouth 2 (two) times daily.   vitamin C 1000 MG tablet Take 1,000 mg by mouth daily.   Vitamin D 50 MCG (2000 UT) tablet Take 2,000 Units by mouth daily.   zinc gluconate 50 MG tablet Take 50 mg by mouth daily.      Allergies  Allergen Reactions  . Dilantin [Phenytoin Sodium Extended] Other (See Comments)    Hallucinations  . Doxycycline  Hyclate     Blurry vision    Follow-up Information    Doree Albee, MD. Schedule an appointment as soon as possible for a visit in 10 day(s).   Specialty: Internal Medicine Contact information: [place] [address] [phone]        Satira Sark, MD .   Specialty: Cardiology Contact information: [place] [address] [phone]           The results of significant diagnostics from this hospitalization (including imaging, microbiology, ancillary and laboratory) are listed below for reference.    Significant Diagnostic Studies: CT Abdomen Pelvis W Contrast  Result Date: 10/31/2019 CLINICAL DATA:  74 year old female with abdominal pain. EXAM: CT ABDOMEN AND PELVIS WITH CONTRAST TECHNIQUE: Multidetector CT imaging of the abdomen and pelvis was performed using the standard protocol following bolus administration of intravenous contrast. CONTRAST:  187mL OMNIPAQUE IOHEXOL 300 MG/ML  SOLN COMPARISON:  CT abdomen pelvis dated 01/25/2019. FINDINGS: Lower chest: There is eventration of the left hemidiaphragm. Left lung base scarring noted. The visualized right lung base are clear. No intra-abdominal free air or free fluid. Hepatobiliary: The liver is unremarkable. There is mild intrahepatic biliary ductal  dilatation. The gallbladder is physiologically distended. No calcified gallstone or pericholecystic fluid. Pancreas: Unremarkable. No pancreatic ductal dilatation or surrounding inflammatory changes. Spleen: Normal in size without focal abnormality. Adrenals/Urinary Tract: The adrenal glands are unremarkable. There is no hydronephrosis on either side. There is symmetric enhancement and excretion of contrast by both kidneys. The visualized ureters and urinary bladder appear unremarkable. Stomach/Bowel: There is sigmoid diverticulosis without active inflammatory changes. Postsurgical changes at the gastroesophageal junction noted. Mildly dilated  jejunal loops measuring up to 3 cm with gradual tapering in the left upper abdomen. Findings may represent a partial/low grade obstruction or segmental ileus. Clinical correlation is recommended. The distal small bowel and the colon are collapsed. The appendix is normal. Vascular/Lymphatic: Mild aortoiliac atherosclerotic disease. The IVC is unremarkable. No portal venous gas. There is no adenopathy. Reproductive: The uterus is anteverted and grossly unremarkable. Bilateral ovarian or paraovarian cysts similar to prior CT. Old calcified granuloma in the pelvis. Other: None Musculoskeletal: Degenerative changes of the spine. Prior left lower rib resections. No acute osseous pathology. IMPRESSION: 1. Findings concerning for an early or partial/low grade proximal small bowel obstruction versus an ileus. Clinical correlation is recommended. 2. Sigmoid diverticulosis. 3. Aortic Atherosclerosis (ICD10-I70.0). Electronically Signed   By: Anner Crete M.D.   On: 10/31/2019 18:22   DG Abd 2 Views  Result Date: 11/01/2019 CLINICAL DATA:  Small-bowel obstruction EXAM: ABDOMEN - 2 VIEW COMPARISON:  None. FINDINGS: Bowel gas pattern is unremarkable with relative paucity of bowel gas. There is residual contrast within the bladder. No free air. No air-fluid levels. Surgical clips in the epigastric region. IMPRESSION: No evidence of bowel obstruction. Electronically Signed   By: Macy Mis M.D.   On: 11/01/2019 08:57   DG Foot Complete Right  Result Date: 10/15/2019 Clinical:  Pain dorsum right foot X-rays were done of the right hip, three views. There is good alignment of the right foot. No fracture noted. Bone quality is good. Impression:  Negative right foot, no acute findings. Electronically Signed Sanjuana Kava, MD 2/16/202110:48 AM   ECHOCARDIOGRAM COMPLETE  Result Date: 10/28/2019    ECHOCARDIOGRAM REPORT   Patient Name:   AVEAH COURTOIS Date of Exam: 10/28/2019 Medical Rec #:  [id number]       Height:        60.0 in Accession #:    [id number]      Weight:       201.2 lb Date of Birth:  November 07, 1945        BSA:          1.871 m Patient Age:    37 years        BP:           149/83 mmHg Patient Gender: F               HR:           90 bpm. Exam Location:  [facility] Procedure: 2D Echo Indications:    Shortness of breath [786.05.ICD-9-CM]  History:        Patient has prior history of Echocardiogram examinations, most                 recent 08/07/2018. Risk Factors:Hypertension, Non-Smoker and                 Dyslipidemia. GERD, Shortness of Breath.  Sonographer:    Leavy Cella RDCS (AE) Referring Phys: [place]  1. Left ventricular ejection fraction, by estimation, is 60  to 65%. The left ventricle has normal function. The left ventricle has no regional wall motion abnormalities. There is mild left ventricular hypertrophy. Left ventricular diastolic parameters are consistent with Grade I diastolic dysfunction (impaired relaxation).  2. Right ventricular systolic function is normal. The right ventricular size is normal. There is normal pulmonary artery systolic pressure. The estimated right ventricular systolic pressure is 0000000 mmHg.  3. Left atrial size was upper normal.  4. The mitral valve is grossly normal. Trivial mitral valve regurgitation.  5. The aortic valve is tricuspid. Aortic valve regurgitation is not visualized. Mild aortic valve sclerosis is present, with no evidence of aortic valve stenosis.  6. The inferior vena cava is normal in size with greater than 50% respiratory variability, suggesting right atrial pressure of 3 mmHg. FINDINGS  Left Ventricle: Left ventricular ejection fraction, by estimation, is 60 to 65%. The left ventricle has normal function. The left ventricle has no regional wall motion abnormalities. The left ventricular internal cavity size was normal in size. There is  mild left ventricular hypertrophy. Left ventricular diastolic parameters are consistent with Grade I  diastolic dysfunction (impaired relaxation). Right Ventricle: The right ventricular size is normal. No increase in right ventricular wall thickness. Right ventricular systolic function is normal. There is normal pulmonary artery systolic pressure. The tricuspid regurgitant velocity is 2.55 m/s, and  with an assumed right atrial pressure of 3 mmHg, the estimated right ventricular systolic pressure is 0000000 mmHg. Left Atrium: Left atrial size was upper normal. Right Atrium: Right atrial size was normal in size. Pericardium: There is no evidence of pericardial effusion. Presence of pericardial fat pad. Mitral Valve: The mitral valve is grossly normal. Trivial mitral valve regurgitation. Tricuspid Valve: The tricuspid valve is grossly normal. Tricuspid valve regurgitation is trivial. Aortic Valve: The aortic valve is tricuspid. Aortic valve regurgitation is not visualized. Mild aortic valve sclerosis is present, with no evidence of aortic valve stenosis. Pulmonic Valve: The pulmonic valve was grossly normal. Pulmonic valve regurgitation is not visualized. Aorta: The aortic root is normal in size and structure. Venous: The inferior vena cava is normal in size with greater than 50% respiratory variability, suggesting right atrial pressure of 3 mmHg. IAS/Shunts: No atrial level shunt detected by color flow Doppler.  LEFT VENTRICLE PLAX 2D LVIDd:         3.19 cm  Diastology LVIDs:         1.79 cm  LV e' lateral:   9.25 cm/s LV PW:         0.92 cm  LV E/e' lateral: 6.8 LV IVS:        1.21 cm  LV e' medial:    6.20 cm/s LVOT diam:     1.90 cm  LV E/e' medial:  10.1 LVOT Area:     2.84 cm  RIGHT VENTRICLE RV S prime:     13.80 cm/s LEFT ATRIUM             Index       RIGHT ATRIUM           Index LA diam:        3.00 cm 1.60 cm/m  RA Area:     11.60 cm LA Vol (A2C):   54.1 ml 28.91 ml/m RA Volume:   27.40 ml  14.64 ml/m LA Vol (A4C):   55.3 ml 29.55 ml/m LA Biplane Vol: 55.0 ml 29.39 ml/m   AORTA Ao Root diam: 3.10 cm  MITRAL VALVE  TRICUSPID VALVE MV Area (PHT): 2.73 cm     TR Peak grad:   26.0 mmHg MV Decel Time: 278 msec     TR Vmax:        255.00 cm/s MV E velocity: 62.60 cm/s MV A velocity: 104.00 cm/s  SHUNTS MV E/A ratio:  0.60         Systemic Diam: 1.90 cm Rozann Lesches MD Electronically signed by Rozann Lesches MD Signature Date/Time: 10/28/2019/3:06:10 PM    Final     Microbiology: Recent Results (from the past 240 hour(s))  Urine culture     Status: Abnormal   Collection Time: 10/31/19  2:15 PM   Specimen: Urine, Random  Result Value Ref Range Status   Specimen Description   Final    URINE, RANDOM Performed at [facility], [address], [place], [place] [address]    Special Requests   Final    NONE Performed at [facility], [address] [place], [place] [address]    Culture (A)  Final    30,000 COLONIES/mL MULTIPLE SPECIES PRESENT, SUGGEST RECOLLECTION   Report Status 11/02/2019 FINAL  Final  SARS CORONAVIRUS 2 (TAT 6-24 HRS) Nasopharyngeal Nasopharyngeal Swab     Status: None   Collection Time: 10/31/19  7:10 PM   Specimen: Nasopharyngeal Swab  Result Value Ref Range Status   SARS Coronavirus 2 NEGATIVE NEGATIVE Final    Comment: (NOTE) SARS-CoV-2 target nucleic acids are NOT DETECTED. The SARS-CoV-2 RNA is generally detectable in upper and lower respiratory specimens during the acute phase of infection. Negative results do not preclude SARS-CoV-2 infection, do not rule out co-infections with other pathogens, and should not be used as the sole basis for treatment or other patient management decisions. Negative results must be combined with clinical observations, patient history, and epidemiological information. The expected result is Negative. Fact Sheet for Patients: SugarRoll.be Fact Sheet for Healthcare Providers: https://www.woods-mathews.com/ This test is not yet approved or cleared by the [place] FDA and   has been authorized for detection and/or diagnosis of SARS-CoV-2 by FDA under an Emergency Use Authorization (EUA). This EUA will remain  in effect (meaning this test can be used) for the duration of the COVID-19 declaration under Section 56 4(b)(1) of the Act, 21 U.S.C. section 360bbb-3(b)(1), unless the authorization is terminated or revoked sooner. Performed at [facility], [place] [address], [place], [place] [address]      Labs: Basic Metabolic Panel: Recent Labs  Lab 10/31/19 1520 11/01/19 0430  NA 141 141  K 4.1 4.2  CL 109 111  CO2 23 20*  GLUCOSE 112* 150*  BUN 18 20  CREATININE 0.73 0.78  CALCIUM 10.5* 9.5   Liver Function Tests: Recent Labs  Lab 10/31/19 1520 11/01/19 0430  AST 22 19  ALT 21 18  ALKPHOS 76 64  BILITOT 0.7 0.8  PROT 7.7 6.6  ALBUMIN 3.8 3.2*   Recent Labs  Lab 10/31/19 1520  LIPASE 30   CBC: Recent Labs  Lab 10/31/19 1520 11/01/19 0430  WBC 8.2 8.5  HGB 13.3 12.1  HCT 42.0 38.3  MCV 85.7 85.7  PLT 334 293    Signed:  Barton Dubois MD.  [facility] 11/02/2019, 3:59 PM

## 2019-11-02 NOTE — Progress Notes (Signed)
Nsg Discharge Note  Admit Date:  10/31/2019 Discharge date: 11/02/2019   [name] to be D/C'dHome per MD order.  AVS completed.  Patient/caregiver able to verbalize understanding.  Discharge Medication: Allergies as of 11/02/2019      Reactions   Dilantin [phenytoin Sodium Extended] Other (See Comments)   Hallucinations   Doxycycline Hyclate    Blurry vision       Medication List    TAKE these medications   dicyclomine 10 MG capsule Commonly known as: BENTYL Take 1 capsule (10 mg total) by mouth 2 (two) times daily as needed. What changed: when to take this   ELDERBERRY PO Take 1 tablet by mouth daily.   escitalopram 10 MG tablet Commonly known as: LEXAPRO Take 1 tablet (10 mg total) by mouth daily.   famotidine 20 MG tablet Commonly known as: Pepcid Take 1 tablet (20 mg total) by mouth at bedtime.   pantoprazole 40 MG tablet Commonly known as: PROTONIX TAKE 1 TABLET BY MOUTH TWICE DAILY 30 MINUTES BEFORE A MEAL What changed: See the new instructions.   progesterone 200 MG capsule Commonly known as: PROMETRIUM TAKE 3 CAPSULES BY MOUTH EVERY NIGHT What changed: See the new instructions.   senna-docusate 8.6-50 MG tablet Commonly known as: Senokot-S Take 1 tablet by mouth at bedtime as needed for mild constipation.   thyroid 120 MG tablet Commonly known as: NP Thyroid Take 1 tablet (120 mg total) by mouth 2 (two) times daily.   vitamin C 1000 MG tablet Take 1,000 mg by mouth daily.   Vitamin D 50 MCG (2000 UT) tablet Take 2,000 Units by mouth daily.   zinc gluconate 50 MG tablet Take 50 mg by mouth daily.       Discharge Assessment: Vitals:   11/02/19 1437 11/02/19 1636  BP: 116/88 (!) 114/55  Pulse: 97 93  Resp:    Temp:  97.6 F (36.4 C)  SpO2: 90% 94%   Skin clean, dry and intact without evidence of skin break down, no evidence of skin tears noted. IV catheter discontinued intact. Site without signs and symptoms of complications - no  redness or edema noted at insertion site, patient denies c/o pain - only slight tenderness at site.  Dressing with slight pressure applied.  D/c Instructions-Education: Discharge instructions given to patient/family with verbalized understanding. D/c education completed with patient/family including follow up instructions, medication list, d/c activities limitations if indicated, with other d/c instructions as indicated by MD - patient able to verbalize understanding, all questions fully answered. Patient instructed to return to ED, call 911, or call MD for any changes in condition.  Patient escorted via [place], and D/C home via private auto.  Berton Bon, RN 11/02/2019 4:47 PM

## 2019-11-02 NOTE — Progress Notes (Signed)
Subjective: Has had watery diarrhea, feels much better.  Had one episode of vomiting of some clear liquid  Objective: Vital signs in last 24 hours: Temp:  [98 F (36.7 C)-98.2 F (36.8 C)] 98.2 F (36.8 C) (03/06 0519) Pulse Rate:  [88-119] 88 (03/06 0519) Resp:  [16-20] 16 (03/06 0519) BP: (102-111)/(54-79) 111/63 (03/06 0519) SpO2:  [89 %-97 %] 97 % (03/06 0519) Last BM Date: 11/01/19  Intake/Output from previous day: 03/05 0701 - 03/06 0700 In: 480 [P.O.:480] Out: -  Intake/Output this shift: Total I/O In: 360 [P.O.:360] Out: -   General appearance: alert, cooperative and no distress GI: soft, non-tender; bowel sounds normal; no masses,  no organomegaly  Lab Results:  Recent Labs    10/31/19 1520 11/01/19 0430  WBC 8.2 8.5  HGB 13.3 12.1  HCT 42.0 38.3  PLT 334 293   BMET Recent Labs    10/31/19 1520 11/01/19 0430  NA 141 141  K 4.1 4.2  CL 109 111  CO2 23 20*  GLUCOSE 112* 150*  BUN 18 20  CREATININE 0.73 0.78  CALCIUM 10.5* 9.5   PT/INR No results for input(s): LABPROT, INR in the last 72 hours.  Studies/Results: CT Abdomen Pelvis W Contrast  Result Date: 10/31/2019 CLINICAL DATA:  74 year old female with abdominal pain. EXAM: CT ABDOMEN AND PELVIS WITH CONTRAST TECHNIQUE: Multidetector CT imaging of the abdomen and pelvis was performed using the standard protocol following bolus administration of intravenous contrast. CONTRAST:  176mL OMNIPAQUE IOHEXOL 300 MG/ML  SOLN COMPARISON:  CT abdomen pelvis dated 01/25/2019. FINDINGS: Lower chest: There is eventration of the left hemidiaphragm. Left lung base scarring noted. The visualized right lung base are clear. No intra-abdominal free air or free fluid. Hepatobiliary: The liver is unremarkable. There is mild intrahepatic biliary ductal dilatation. The gallbladder is physiologically distended. No calcified gallstone or pericholecystic fluid. Pancreas: Unremarkable. No pancreatic ductal dilatation or  surrounding inflammatory changes. Spleen: Normal in size without focal abnormality. Adrenals/Urinary Tract: The adrenal glands are unremarkable. There is no hydronephrosis on either side. There is symmetric enhancement and excretion of contrast by both kidneys. The visualized ureters and urinary bladder appear unremarkable. Stomach/Bowel: There is sigmoid diverticulosis without active inflammatory changes. Postsurgical changes at the gastroesophageal junction noted. Mildly dilated jejunal loops measuring up to 3 cm with gradual tapering in the left upper abdomen. Findings may represent a partial/low grade obstruction or segmental ileus. Clinical correlation is recommended. The distal small bowel and the colon are collapsed. The appendix is normal. Vascular/Lymphatic: Mild aortoiliac atherosclerotic disease. The IVC is unremarkable. No portal venous gas. There is no adenopathy. Reproductive: The uterus is anteverted and grossly unremarkable. Bilateral ovarian or paraovarian cysts similar to prior CT. Old calcified granuloma in the pelvis. Other: None Musculoskeletal: Degenerative changes of the spine. Prior left lower rib resections. No acute osseous pathology. IMPRESSION: 1. Findings concerning for an early or partial/low grade proximal small bowel obstruction versus an ileus. Clinical correlation is recommended. 2. Sigmoid diverticulosis. 3. Aortic Atherosclerosis (ICD10-I70.0). Electronically Signed   By: Anner Crete M.D.   On: 10/31/2019 18:22   DG Abd 2 Views  Result Date: 11/01/2019 CLINICAL DATA:  Small-bowel obstruction EXAM: ABDOMEN - 2 VIEW COMPARISON:  None. FINDINGS: Bowel gas pattern is unremarkable with relative paucity of bowel gas. There is residual contrast within the bladder. No free air. No air-fluid levels. Surgical clips in the epigastric region. IMPRESSION: No evidence of bowel obstruction. Electronically Signed   By: Addison Lank.D.  On: 11/01/2019 08:57     Anti-infectives: Anti-infectives (From admission, onward)   None      Assessment/Plan: Imp:  Partial bowel obstruction, resolved.  Will advance to regular diet.  Patient may be discharged if tolerates diet.  No need for surgical intervention at this time.  LOS: 0 days    Aviva Signs 11/02/2019

## 2019-11-02 NOTE — Progress Notes (Signed)
Discharge instructions provided to patient.  Pt verbalized understanding of f/u appts and medications.  Iv removed from rt arm with cath intact. No distress noted. Left unit via w/c

## 2019-11-04 ENCOUNTER — Ambulatory Visit (INDEPENDENT_AMBULATORY_CARE_PROVIDER_SITE_OTHER): Payer: Medicare Other | Admitting: Internal Medicine

## 2019-11-05 ENCOUNTER — Ambulatory Visit (HOSPITAL_COMMUNITY): Payer: Medicare Other

## 2019-11-05 ENCOUNTER — Encounter (HOSPITAL_COMMUNITY): Payer: Medicare Other

## 2019-11-06 ENCOUNTER — Encounter (INDEPENDENT_AMBULATORY_CARE_PROVIDER_SITE_OTHER): Payer: Self-pay | Admitting: Internal Medicine

## 2019-11-06 ENCOUNTER — Other Ambulatory Visit: Payer: Self-pay

## 2019-11-06 ENCOUNTER — Ambulatory Visit (INDEPENDENT_AMBULATORY_CARE_PROVIDER_SITE_OTHER): Payer: Medicare Other | Admitting: Internal Medicine

## 2019-11-06 VITALS — BP 120/79 | HR 55 | Temp 98.7°F | Ht 61.0 in | Wt 196.2 lb

## 2019-11-06 DIAGNOSIS — R05 Cough: Secondary | ICD-10-CM

## 2019-11-06 DIAGNOSIS — R1013 Epigastric pain: Secondary | ICD-10-CM

## 2019-11-06 DIAGNOSIS — R059 Cough, unspecified: Secondary | ICD-10-CM

## 2019-11-06 MED ORDER — ACETAMINOPHEN-CODEINE #3 300-30 MG PO TABS: 1.0000 | ORAL_TABLET | ORAL | 0 refills | Status: DC | PRN

## 2019-11-06 NOTE — Progress Notes (Signed)
Metrics: Intervention Frequency ACO  Documented Smoking Status Yearly  Screened one or more times in 24 months  Cessation Counseling or  Active cessation medication Past 24 months  Past 24 months   Guideline developer: UpToDate (See UpToDate for funding source) Date Released: 2014       Wellness Office Visit  Subjective:  Patient ID: Annette Saunders, female    DOB: 11-30-1945  Age: 74 y.o. MRN: [id number]  CC: This is a follow-up appointment from patient's hospitalization.  The patient was discharged from the hospital on November 02, 2019, 4 days ago.  She was admitted because of epigastric pain.  HPI  I have seen her in the office with the above symptoms and it appeared to me that she was in quite severe pain.  At that time, she did not have any  vomiting, but later on she did so she went to the emergency room.  She was treated conservatively with IV fluids and n.p.o.  She did not have an NG tube inserted.  Abdominal films seem to imply partial small bowel obstruction.  This resolved without needing any surgery and she was seen by the surgeons while she was in the hospital.  She is now feeling much improved without any major problems.  She has no nausea, vomiting, epigastric pain.  She does have some dry cough which she says is helped by codeine.  This was her experience in the past. Past Medical History:  Diagnosis Date  . Anxiety   . Essential hypertension, benign 06/06/2019  . Generalized osteoarthritis   . GERD (gastroesophageal reflux disease)   . Hyperlipidemia   . Hypothyroidism, adult 06/06/2019  . Insomnia   . Malaise and fatigue 06/06/2019  . Obesity (BMI 30.0-34.9) 06/06/2019      Family History  Problem Relation Age of Onset  . Colon cancer Neg Hx     Social History   Social History Narrative   Widow for 77 years-husband died of MI.Lives alone.   Social History   Tobacco Use  . Smoking status: Never Smoker  . Smokeless tobacco: Never Used  Substance Use Topics  .  Alcohol use: No    Alcohol/week: 0.0 standard drinks    Current Meds  Medication Sig  . Ascorbic Acid (VITAMIN C) 1000 MG tablet Take 1,000 mg by mouth daily.  . Cholecalciferol (VITAMIN D) 50 MCG (2000 UT) tablet Take 2,000 Units by mouth daily.   [name] dicyclomine (BENTYL) 10 MG capsule Take 1 capsule (10 mg total) by mouth 2 (two) times daily as needed. (Patient taking differently: Take 10 mg by mouth 2 (two) times daily. )  . ELDERBERRY PO Take 1 tablet by mouth daily.  [name] escitalopram (LEXAPRO) 10 MG tablet Take 1 tablet (10 mg total) by mouth daily.  . famotidine (PEPCID) 20 MG tablet Take 1 tablet (20 mg total) by mouth at bedtime.  . pantoprazole (PROTONIX) 40 MG tablet TAKE 1 TABLET BY MOUTH TWICE DAILY 30 MINUTES BEFORE A MEAL (Patient taking differently: Take 40 mg by mouth 2 (two) times daily before a meal. )  . progesterone (PROMETRIUM) 200 MG capsule TAKE 3 CAPSULES BY MOUTH EVERY NIGHT (Patient taking differently: Take 600 mg by mouth daily. )  . senna-docusate (SENOKOT-S) 8.6-50 MG tablet Take 1 tablet by mouth at bedtime as needed for mild constipation.  [name] thyroid (NP THYROID) 120 MG tablet Take 1 tablet (120 mg total) by mouth 2 (two) times daily.  [name] zinc gluconate 50 MG tablet Take 50  mg by mouth daily.       Objective:   Today's Vitals: BP 120/79 (BP Location: Right Arm, Patient Position: Sitting, Cuff Size: Normal)   Pulse (!) 55   Temp 98.7 F (37.1 C) (Temporal)   Wt 196 lb 3.2 oz (89 kg)   SpO2 (!) 80%   BMI 37.07 kg/m  Vitals with BMI 11/06/2019 11/02/2019 11/02/2019  Height - - -  Weight 196 lbs 3 oz - -  BMI 123456 - -  Systolic 123456 [id number]  Diastolic 79 55 88  Pulse 55 93 97     Physical Exam   He looks systemically well.  She does not appear to be in any distress at the present time.    Assessment   1. Epigastric pain   2. Cough       Tests ordered No orders of the defined types were placed in this encounter.    Plan: 1. I have sent a  prescription for Tylenol with codeine for cough. 2. I am glad that the partial small bowel obstruction seems to have resolved now.  Her electrolytes were normal when she was discharged from the hospital.  I do not see the need to repeat them now. 3. She will follow-up as scheduled.   Meds ordered this encounter  Medications  . acetaminophen-codeine (TYLENOL #3) 300-30 MG tablet    Sig: Take 1 tablet by mouth every 4 (four) hours as needed for moderate pain.    Dispense:  30 tablet    Refill:  0    Juliya Magill Luther Parody, MD

## 2019-11-08 DIAGNOSIS — Z23 Encounter for immunization: Secondary | ICD-10-CM | POA: Diagnosis not present

## 2019-11-25 ENCOUNTER — Other Ambulatory Visit (INDEPENDENT_AMBULATORY_CARE_PROVIDER_SITE_OTHER): Payer: Self-pay | Admitting: Internal Medicine

## 2019-11-26 ENCOUNTER — Ambulatory Visit (HOSPITAL_COMMUNITY)
Admission: RE | Admit: 2019-11-26 | Discharge: 2019-11-26 | Disposition: A | Discharge status: Home / Self Care | Payer: Medicare Other | Source: Ambulatory Visit | Attending: Nurse Practitioner | Admitting: Nurse Practitioner

## 2019-11-26 ENCOUNTER — Ambulatory Visit: Payer: Medicare Other | Admitting: Orthopaedic Surgery

## 2019-11-26 ENCOUNTER — Other Ambulatory Visit: Payer: Self-pay

## 2019-11-26 DIAGNOSIS — R928 Other abnormal and inconclusive findings on diagnostic imaging of breast: Secondary | ICD-10-CM | POA: Diagnosis not present

## 2019-11-26 DIAGNOSIS — N644 Mastodynia: Secondary | ICD-10-CM

## 2019-12-18 ENCOUNTER — Ambulatory Visit: Payer: Medicare Other | Admitting: Gastroenterology

## 2019-12-20 ENCOUNTER — Ambulatory Visit
Admission: EM | Admit: 2019-12-20 | Discharge: 2019-12-20 | Disposition: A | Discharge status: Home / Self Care | Payer: Medicare Other | Attending: Emergency Medicine | Admitting: Emergency Medicine

## 2019-12-20 ENCOUNTER — Ambulatory Visit (INDEPENDENT_AMBULATORY_CARE_PROVIDER_SITE_OTHER): Payer: Medicare Other

## 2019-12-20 ENCOUNTER — Other Ambulatory Visit: Payer: Self-pay

## 2019-12-20 DIAGNOSIS — M79641 Pain in right hand: Secondary | ICD-10-CM

## 2019-12-20 DIAGNOSIS — M19041 Primary osteoarthritis, right hand: Secondary | ICD-10-CM | POA: Diagnosis not present

## 2019-12-20 DIAGNOSIS — M79644 Pain in right finger(s): Secondary | ICD-10-CM

## 2019-12-20 DIAGNOSIS — M7989 Other specified soft tissue disorders: Secondary | ICD-10-CM | POA: Diagnosis not present

## 2019-12-20 DIAGNOSIS — W5501XA Bitten by cat, initial encounter: Secondary | ICD-10-CM

## 2019-12-20 DIAGNOSIS — S61258A Open bite of other finger without damage to nail, initial encounter: Secondary | ICD-10-CM

## 2019-12-20 MED ORDER — AMOXICILLIN-POT CLAVULANATE 875-125 MG PO TABS: 1.0000 | ORAL_TABLET | Freq: Two times a day (BID) | ORAL | 0 refills | Status: DC

## 2019-12-20 MED ORDER — ACETAMINOPHEN 500 MG PO TABS: 500.0000 mg | ORAL_TABLET | Freq: Four times a day (QID) | ORAL | 0 refills | Status: DC | PRN

## 2019-12-20 NOTE — ED Triage Notes (Signed)
Pt presents with cat bite to right hand, swelling noted. Happened this morning, no concerns of rabies

## 2019-12-20 NOTE — ED Provider Notes (Signed)
[facility]    CSN: [id number] Arrival date & time: 12/20/19  1627      History   Chief Complaint Chief Complaint  Patient presents with  . Animal Bite    HPI Annette Saunders is a 74 y.o. female.   Who presented to the urgent care with a complaint of cat bite to the right hand that occurred this morning.  Reported redness and swelling.  The cat [place] and she has been feeding  him for a year.  She was trying to pick the cat up and it bite her hand.  Localizes the bite to her right hand.  Does not know the cat Immunization status.  Denies previous hx of animal bite.  Denies fever, chills, nausea, vomiting, headache, dizziness, weakness, fatigue, rash, or abdominal pain.  Tetanus immunization is up-to-date  The history is provided by the patient. The history is limited by a language barrier. No language interpreter was used.  Animal Bite   Past Medical History:  Diagnosis Date  . Anxiety   . Essential hypertension, benign 06/06/2019  . Generalized osteoarthritis   . GERD (gastroesophageal reflux disease)   . Hyperlipidemia   . Hypothyroidism, adult 06/06/2019  . Insomnia   . Malaise and fatigue 06/06/2019  . Obesity (BMI 30.0-34.9) 06/06/2019    Patient Active Problem List   Diagnosis Date Noted  . Epigastric pain   . Non-intractable vomiting   . SBO (small bowel obstruction) ([place])   . Class 2 obesity due to excess calories with body mass index (BMI) of 37.0 to 37.9 in adult   . Ileus ([place]) 10/31/2019  . Shortness of breath 10/16/2019  . Encounter to discuss test results 10/16/2019  . Renal insufficiency 10/16/2019  . Healthcare maintenance 10/16/2019  . Otitis media 10/16/2019  . Breast pain 10/16/2019  . Lower abdominal pain 09/17/2019  . Essential hypertension, benign 06/06/2019  . Malaise and fatigue 06/06/2019  . Obesity (BMI 30.0-34.9) 06/06/2019  . Hypothyroidism, adult 06/06/2019  . Diarrhea 03/28/2019  . Pain due to total right knee  replacement ([place]) 05/21/2018  . Dysphagia 12/14/2017  . GERD (gastroesophageal reflux disease) 10/27/2016    Past Surgical History:  Procedure Laterality Date  . COLONOSCOPY  10/2014   Dr. Cristine Polio, outside hospital. Colonoscopy performed with Propofol, no polyps, small to medium size internal hemorrhoids noted.   [name] ECTOPIC PREGNANCY SURGERY    . ELBOW FRACTURE SURGERY    . HIATAL HERNIA REPAIR    . JOINT REPLACEMENT     bilateral knee  . TONSILLECTOMY      OB History   No obstetric history on file.      Home Medications    Prior to Admission medications   Medication Sig Start Date End Date Taking? Authorizing Provider  acetaminophen (TYLENOL) 500 MG tablet Take 1 tablet (500 mg total) by mouth every 6 (six) hours as needed. 12/20/19   Emmagrace Runkel, Darrelyn Hillock, FNP  acetaminophen-codeine (TYLENOL #3) 300-30 MG tablet Take 1 tablet by mouth every 4 (four) hours as needed for moderate pain. 11/06/19   Doree Albee, MD  amoxicillin-clavulanate (AUGMENTIN) 875-125 MG tablet Take 1 tablet by mouth every 12 (twelve) hours. 12/20/19   Moet Mikulski, Darrelyn Hillock, FNP  Ascorbic Acid (VITAMIN C) 1000 MG tablet Take 1,000 mg by mouth daily.    [provider]  Cholecalciferol (VITAMIN D) 50 MCG (2000 UT) tablet Take 2,000 Units by mouth daily.     [provider]  dicyclomine (  BENTYL) 10 MG capsule Take 1 capsule (10 mg total) by mouth 2 (two) times daily as needed. Patient taking differently: Take 10 mg by mouth 2 (two) times daily.  08/07/19   Erenest Rasher, PA-C  ELDERBERRY PO Take 1 tablet by mouth daily.    [provider]  escitalopram (LEXAPRO) 10 MG tablet Take 1 tablet (10 mg total) by mouth daily. 10/16/19   Ailene Ards, NP  famotidine (PEPCID) 20 MG tablet Take 1 tablet (20 mg total) by mouth at bedtime. 11/02/19 11/01/20  Barton Dubois, MD  pantoprazole (PROTONIX) 40 MG tablet TAKE 1 TABLET BY MOUTH TWICE DAILY 30 MINUTES BEFORE A MEAL Patient taking differently:  Take 40 mg by mouth 2 (two) times daily before a meal.  09/30/19   Harper, Tivis Ringer, PA-C  progesterone (PROMETRIUM) 200 MG capsule TAKE 3 CAPSULES BY MOUTH EVERY NIGHT Patient taking differently: Take 600 mg by mouth daily.  10/07/19   Doree Albee, MD  senna-docusate (SENOKOT-S) 8.6-50 MG tablet Take 1 tablet by mouth at bedtime as needed for mild constipation. 11/02/19   Barton Dubois, MD  thyroid (NP THYROID) 120 MG tablet Take 1 tablet (120 mg total) by mouth 2 (two) times daily. 09/09/19   Doree Albee, MD  zinc gluconate 50 MG tablet Take 50 mg by mouth daily.    [provider]    Family History Family History  Problem Relation Age of Onset  . Colon cancer Neg Hx     Social History Social History   Tobacco Use  . Smoking status: Never Smoker  . Smokeless tobacco: Never Used  Substance Use Topics  . Alcohol use: No    Alcohol/week: 0.0 standard drinks  . Drug use: No     Allergies   Dilantin [phenytoin sodium extended] and Doxycycline hyclate   Review of Systems Review of Systems  Constitutional: Negative.   Respiratory: Negative.   Cardiovascular: Negative.   Skin: Positive for color change and wound.  All other systems reviewed and are negative.    Physical Exam Triage Vital Signs ED Triage Vitals  Enc Vitals Group     BP 12/20/19 1638 124/82     Pulse Rate 12/20/19 1638 (!) 125     Resp 12/20/19 1638 18     Temp 12/20/19 1638 98.2 F (36.8 C)     Temp src --      SpO2 12/20/19 1638 95 %     Weight --      Height --      Head Circumference --      Peak Flow --      Pain Score 12/20/19 1635 8     Pain Loc --      Pain Edu? --      Excl. in [place]? --    No data found.  Updated Vital Signs BP 124/82   Pulse (!) 125   Temp 98.2 F (36.8 C)   Resp 18   SpO2 95%   Visual Acuity Right Eye Distance:   Left Eye Distance:   Bilateral Distance:    Right Eye Near:   Left Eye Near:    Bilateral Near:     Physical Exam Vitals and  nursing note reviewed.  Constitutional:      General: She is not in acute distress.    Appearance: Normal appearance. She is normal weight. She is not ill-appearing, toxic-appearing or diaphoretic.  Cardiovascular:     Rate and Rhythm: Normal  rate and regular rhythm.     Pulses: Normal pulses.     Heart sounds: Normal heart sounds. No murmur. No friction rub. No gallop.   Pulmonary:     Effort: Pulmonary effort is normal. No respiratory distress.     Breath sounds: Normal breath sounds. No stridor. No wheezing, rhonchi or rales.  Chest:     Chest wall: No tenderness.  Skin:    General: Skin is warm.     Coloration: Skin is not pale.     Findings: Erythema and wound present. No lesion or rash.  Neurological:     Mental Status: She is alert.      UC Treatments / Results  Labs (all labs ordered are listed, but only abnormal results are displayed) Labs Reviewed - No data to display  EKG   Radiology DG Hand Complete Right  Result Date: 12/20/2019 CLINICAL DATA:  Right index finger pain and swelling following a cat bite this morning. EXAM: RIGHT HAND - COMPLETE 3+ VIEW COMPARISON:  None. FINDINGS: Mild spur formation involving the 2nd, 3rd and 4th the IP joints. This is mildly fragmented at the dorsal aspect of the 2nd D IP joint with corticated margins. Minimal 1st IP joint degenerative spur formation. No fracture, dislocation, soft tissue gas or radiopaque foreign body. IMPRESSION: No acute abnormality. Mild degenerative changes, as described above. Electronically Signed   By: Claudie Revering M.D.   On: 12/20/2019 17:22    Procedures Procedures (including critical care time)  Medications Ordered in UC Medications - No data to display  Initial Impression / Assessment and Plan / UC Course  I have reviewed the triage vital signs and the nursing notes.  Pertinent labs & imaging results that were available during my care of the patient were reviewed by me and considered in my  medical decision making (see chart for details).    Patient is stable for discharge.  Augmentin will be prescribed.  Was advised to go to ED  for rabies immunization.  Return or go to ED for worsening of symptoms.  Final Clinical Impressions(s) / UC Diagnoses   Final diagnoses:  Cat bite, initial encounter  Hand pain, right     Discharge Instructions     Wash with warm water and mild soap Augmentin prescribed.  Take as directed and to completion Follow up with PCP if symptoms persists Was advised to go to ED for rabies immunization Return or go to the ED if you have any new or worsening symptoms such as increasing redness, swelling, drainage, fever, chills, nausea, chest pain, SOB, etc...     ED Prescriptions    Medication Sig Dispense Auth. Provider   amoxicillin-clavulanate (AUGMENTIN) 875-125 MG tablet Take 1 tablet by mouth every 12 (twelve) hours. 14 tablet Ellinor Test, Darrelyn Hillock, FNP   acetaminophen (TYLENOL) 500 MG tablet Take 1 tablet (500 mg total) by mouth every 6 (six) hours as needed. 30 tablet Breion Novacek, Darrelyn Hillock, FNP     PDMP not reviewed this encounter.   Emerson Monte, [place] 12/20/19 1731

## 2019-12-20 NOTE — Discharge Instructions (Addendum)
Wash with warm water and mild soap Augmentin prescribed.  Take as directed and to completion Follow up with PCP if symptoms persists Was advised to go to ED for rabies immunization Return or go to the ED if you have any new or worsening symptoms such as increasing redness, swelling, drainage, fever, chills, nausea, chest pain, SOB, etc..[name]

## 2019-12-23 DIAGNOSIS — D492 Neoplasm of unspecified behavior of bone, soft tissue, and skin: Secondary | ICD-10-CM | POA: Diagnosis not present

## 2019-12-23 DIAGNOSIS — C44319 Basal cell carcinoma of skin of other parts of face: Secondary | ICD-10-CM | POA: Diagnosis not present

## 2019-12-23 DIAGNOSIS — L308 Other specified dermatitis: Secondary | ICD-10-CM | POA: Diagnosis not present

## 2019-12-23 DIAGNOSIS — W5501XA Bitten by cat, initial encounter: Secondary | ICD-10-CM | POA: Diagnosis not present

## 2019-12-23 DIAGNOSIS — L720 Epidermal cyst: Secondary | ICD-10-CM | POA: Diagnosis not present

## 2019-12-29 ENCOUNTER — Other Ambulatory Visit (INDEPENDENT_AMBULATORY_CARE_PROVIDER_SITE_OTHER): Payer: Self-pay | Admitting: Internal Medicine

## 2020-01-11 ENCOUNTER — Ambulatory Visit
Admission: EM | Admit: 2020-01-11 | Discharge: 2020-01-11 | Disposition: A | Discharge status: Home / Self Care | Payer: Medicare Other | Attending: Emergency Medicine | Admitting: Emergency Medicine

## 2020-01-11 DIAGNOSIS — H66002 Acute suppurative otitis media without spontaneous rupture of ear drum, left ear: Secondary | ICD-10-CM | POA: Diagnosis not present

## 2020-01-11 DIAGNOSIS — Z20822 Contact with and (suspected) exposure to covid-19: Secondary | ICD-10-CM | POA: Diagnosis not present

## 2020-01-11 DIAGNOSIS — J029 Acute pharyngitis, unspecified: Secondary | ICD-10-CM | POA: Diagnosis not present

## 2020-01-11 LAB — POCT RAPID STREP A (OFFICE): Rapid Strep A Screen: NEGATIVE

## 2020-01-11 MED ORDER — AMOXICILLIN 500 MG PO CAPS: 500.0000 mg | ORAL_CAPSULE | Freq: Two times a day (BID) | ORAL | 0 refills | Status: DC

## 2020-01-11 MED ORDER — BENZONATATE 100 MG PO CAPS: 100.0000 mg | ORAL_CAPSULE | Freq: Three times a day (TID) | ORAL | 0 refills | Status: DC

## 2020-01-11 MED ORDER — PREDNISONE 20 MG PO TABS: 20.0000 mg | ORAL_TABLET | Freq: Two times a day (BID) | ORAL | 0 refills | Status: DC

## 2020-01-11 MED ORDER — KETOROLAC TROMETHAMINE 60 MG/2ML IM SOLN
60.0000 mg | Freq: Once | INTRAMUSCULAR | Status: AC
  Administered 2020-01-11: 60 mg via INTRAMUSCULAR

## 2020-01-11 MED ORDER — IBUPROFEN 800 MG PO TABS: 800.0000 mg | ORAL_TABLET | Freq: Two times a day (BID) | ORAL | 0 refills | Status: DC | PRN

## 2020-01-11 MED ORDER — DEXAMETHASONE SODIUM PHOSPHATE 10 MG/ML IJ SOLN
10.0000 mg | Freq: Once | INTRAMUSCULAR | Status: AC
  Administered 2020-01-11: 10 mg via INTRAMUSCULAR

## 2020-01-11 MED ORDER — PREDNISONE 20 MG PO TABS: 20.0000 mg | ORAL_TABLET | Freq: Two times a day (BID) | ORAL | 0 refills | Status: AC

## 2020-01-11 NOTE — ED Triage Notes (Signed)
Pt presents with c/o sore throat. Hurts to swallow and also tender to touch

## 2020-01-11 NOTE — Discharge Instructions (Addendum)
Steroid and toradol shot given in office Strep throat negative. Culture sent.  WE will follow up with you regarding abnormal results COVID testing ordered.  It will take between 2-5 days for test results.  Someone will contact you regarding abnormal results.    In the meantime: You should remain isolated in your home for 10 days from symptom onset AND greater than 72 hours after symptoms resolution (absence of fever without the use of fever-reducing medication and improvement in respiratory symptoms), whichever is longer Get plenty of rest and push fluids Tessalon Perles prescribed for cough Amoxicillin prescribed.  Take as directed and to completion Ibuprofen and prednisone prescribed for sore throat.  DO NOT TAKE ibuprofen with lexapro as this may cause GI upset and potential bleed Use OTC zyrtec for nasal congestion, runny nose, and/or sore throat Use OTC flonase for nasal congestion and runny nose Use OTC medications like ibuprofen or tylenol as needed fever or pain Call or go to the ED if you have any new or worsening symptoms such as fever, cough, shortness of breath, chest tightness, chest pain, turning blue, changes in mental status, etc..[name]

## 2020-01-11 NOTE — ED Provider Notes (Signed)
[place] [facility]   [id number] 01/11/20 Arrival Time: [id number]   CC: COVID symptoms  SUBJECTIVE: History from: patient.  Annette Saunders is a 74 y.o. female who presents with ear pain, runny nose, sore throat, cough, and fatigue x 5 days, worsened over the last day or two.  Denies sick exposure to COVID, flu or strep.  Does admit to recent travel to [place] to visit grand children.  Had both COVID vaccines.  Has tried OTC medications without relief.  Symptoms are made worse with swallowing, but tolerating own liquids and secretions.  Denies previous symptoms in the past.   Denies fever, chills, sinus pain, SOB, wheezing, chest pain, nausea, changes in bowel or bladder habits.     ROS: As per HPI.  All other pertinent ROS negative.     Past Medical History:  Diagnosis Date  . Anxiety   . Essential hypertension, benign 06/06/2019  . Generalized osteoarthritis   . GERD (gastroesophageal reflux disease)   . Hyperlipidemia   . Hypothyroidism, adult 06/06/2019  . Insomnia   . Malaise and fatigue 06/06/2019  . Obesity (BMI 30.0-34.9) 06/06/2019   Past Surgical History:  Procedure Laterality Date  . COLONOSCOPY  10/2014   Dr. Cristine Polio, outside hospital. Colonoscopy performed with Propofol, no polyps, small to medium size internal hemorrhoids noted.   [name] ECTOPIC PREGNANCY SURGERY    . ELBOW FRACTURE SURGERY    . HIATAL HERNIA REPAIR    . JOINT REPLACEMENT     bilateral knee  . TONSILLECTOMY     Allergies  Allergen Reactions  . Dilantin [Phenytoin Sodium Extended] Other (See Comments)    Hallucinations  . Doxycycline Hyclate     Blurry vision    No current facility-administered medications on file prior to encounter.   Current Outpatient Medications on File Prior to Encounter  Medication Sig Dispense Refill  . acetaminophen (TYLENOL) 500 MG tablet Take 1 tablet (500 mg total) by mouth every 6 (six) hours as needed. 30 tablet 0  . acetaminophen-codeine (TYLENOL #3) 300-30 MG tablet Take 1  tablet by mouth every 4 (four) hours as needed for moderate pain. 30 tablet 0  . Ascorbic Acid (VITAMIN C) 1000 MG tablet Take 1,000 mg by mouth daily.    . Cholecalciferol (VITAMIN D) 50 MCG (2000 UT) tablet Take 2,000 Units by mouth daily.     [name] dicyclomine (BENTYL) 10 MG capsule Take 1 capsule (10 mg total) by mouth 2 (two) times daily as needed. (Patient taking differently: Take 10 mg by mouth 2 (two) times daily. ) 120 capsule 1  . ELDERBERRY PO Take 1 tablet by mouth daily.    [name] escitalopram (LEXAPRO) 10 MG tablet Take 1 tablet (10 mg total) by mouth daily. 90 tablet 0  . famotidine (PEPCID) 20 MG tablet Take 1 tablet (20 mg total) by mouth at bedtime. 30 tablet 1  . pantoprazole (PROTONIX) 40 MG tablet TAKE 1 TABLET BY MOUTH TWICE DAILY 30 MINUTES BEFORE A MEAL (Patient taking differently: Take 40 mg by mouth 2 (two) times daily before a meal. ) 60 tablet 3  . progesterone (PROMETRIUM) 200 MG capsule TAKE 3 CAPSULES BY MOUTH EVERY NIGHT (Patient taking differently: Take 600 mg by mouth daily. ) 90 capsule 3  . senna-docusate (SENOKOT-S) 8.6-50 MG tablet Take 1 tablet by mouth at bedtime as needed for mild constipation. 40 tablet 0  . thyroid (NP THYROID) 120 MG tablet Take 1 tablet (120 mg total) by mouth 2 (two) times daily. [place]  tablet 3  . zinc gluconate 50 MG tablet Take 50 mg by mouth daily.     Social History   Socioeconomic History  . Marital status: Widowed    Spouse name: Not on file  . Number of children: Not on file  . Years of education: Not on file  . Highest education level: Not on file  Occupational History  . Occupation: retired  Tobacco Use  . Smoking status: Never Smoker  . Smokeless tobacco: Never Used  Substance and Sexual Activity  . Alcohol use: No    Alcohol/week: 0.0 standard drinks  . Drug use: No  . Sexual activity: Not on file  Other Topics Concern  . Not on file  Social History Narrative   Widow for 46 years-husband died of MI.Lives alone.   Social  Determinants of Health   Financial Resource Strain:   . Difficulty of Paying Living Expenses:   Food Insecurity:   . Worried About Charity fundraiser in the Last Year:   . Arboriculturist in the Last Year:   Transportation Needs:   . Film/video editor (Medical):   [name] Lack of Transportation (Non-Medical):   Physical Activity:   . Days of Exercise per Week:   . Minutes of Exercise per Session:   Stress:   . Feeling of Stress :   Social Connections:   . Frequency of Communication with Friends and Family:   . Frequency of Social Gatherings with Friends and Family:   . Attends Religious Services:   . Active Member of Clubs or Organizations:   . Attends Archivist Meetings:   [name] Marital Status:   Intimate Partner Violence:   . Fear of Current or Ex-Partner:   . Emotionally Abused:   [name] Physically Abused:   . Sexually Abused:    Family History  Problem Relation Age of Onset  . Colon cancer Neg Hx     OBJECTIVE:  Vitals:   01/11/20 1006  BP: (!) 143/68  Pulse: 93  Resp: 20  Temp: 98.2 F (36.8 C)  SpO2: 99%     General appearance: alert; appears fatigued, but nontoxic; speaking in full sentences and tolerating own secretions HEENT: NCAT; Ears: EACs clear, RT TM pearly gray, LT TM erythematous; Eyes: PERRL.  EOM grossly intact. Nose: nares patent without rhinorrhea, Throat: oropharynx clear, tonsils non erythematous or enlarged, uvula midline  Neck: supple without LAD Lungs: unlabored respirations, symmetrical air entry; cough: mild; no respiratory distress; CTAB Heart: regular rate and rhythm.   Skin: warm and dry Psychological: alert and cooperative; normal mood and affect  LABS:  Results for orders placed or performed during the hospital encounter of 01/11/20 (from the past 24 hour(s))  POCT rapid strep A     Status: None   Collection Time: 01/11/20 10:14 AM  Result Value Ref Range   Rapid Strep A Screen Negative Negative     ASSESSMENT &  PLAN:  1. Sore throat   2. Non-recurrent acute suppurative otitis media of left ear without spontaneous rupture of tympanic membrane   3. Suspected COVID-19 virus infection     Meds ordered this encounter  Medications  . amoxicillin (AMOXIL) 500 MG capsule    Sig: Take 1 capsule (500 mg total) by mouth 2 (two) times daily for 10 days.    Dispense:  20 capsule    Refill:  0    Order Specific Question:   Supervising Provider    Answer:  Blanchie Serve SUE [id number]  . benzonatate (TESSALON) 100 MG capsule    Sig: Take 1 capsule (100 mg total) by mouth every 8 (eight) hours.    Dispense:  21 capsule    Refill:  0    Order Specific Question:   Supervising Provider    Answer:   Raylene Everts [id number]  . predniSONE (DELTASONE) 20 MG tablet    Sig: Take 1 tablet (20 mg total) by mouth 2 (two) times daily with a meal for 5 days.    Dispense:  10 tablet    Refill:  0    Order Specific Question:   Supervising Provider    Answer:   Raylene Everts [id number]  . ibuprofen (ADVIL) 800 MG tablet    Sig: Take 1 tablet (800 mg total) by mouth 2 (two) times daily as needed for moderate pain.    Dispense:  21 tablet    Refill:  0    Order Specific Question:   Supervising Provider    Answer:   Raylene Everts [id number]    Steroid and toradol shot given in office Strep throat negative. Culture sent.  WE will follow up with you regarding abnormal results COVID testing ordered.  It will take between 2-5 days for test results.  Someone will contact you regarding abnormal results.    In the meantime: You should remain isolated in your home for 10 days from symptom onset AND greater than 72 hours after symptoms resolution (absence of fever without the use of fever-reducing medication and improvement in respiratory symptoms), whichever is longer Get plenty of rest and push fluids Tessalon Perles prescribed for cough Amoxicillin prescribed.  Take as directed and to completion Ibuprofen  and prednisone prescribed for sore throat.  DO NOT TAKE ibuprofen with lexapro as this may cause GI upset and potential bleed Use OTC zyrtec for nasal congestion, runny nose, and/or sore throat Use OTC flonase for nasal congestion and runny nose Use OTC medications like ibuprofen or tylenol as needed fever or pain Call or go to the ED if you have any new or worsening symptoms such as fever, cough, shortness of breath, chest tightness, chest pain, turning blue, changes in mental status, etc...   Reviewed expectations re: course of current medical issues. Questions answered. Outlined signs and symptoms indicating need for more acute intervention. Patient verbalized understanding. After Visit Summary given.         Lestine Box, PA-C 01/11/20 1048

## 2020-01-12 LAB — NOVEL CORONAVIRUS, NAA: SARS-CoV-2, NAA: NOT DETECTED

## 2020-01-12 LAB — SARS-COV-2, NAA 2 DAY TAT

## 2020-01-13 LAB — CULTURE, GROUP A STREP (THRC)

## 2020-01-17 ENCOUNTER — Ambulatory Visit: Payer: Medicare Other | Admitting: Gastroenterology

## 2020-01-20 ENCOUNTER — Other Ambulatory Visit: Payer: Self-pay

## 2020-01-20 ENCOUNTER — Encounter (INDEPENDENT_AMBULATORY_CARE_PROVIDER_SITE_OTHER): Payer: Self-pay | Admitting: Nurse Practitioner

## 2020-01-20 ENCOUNTER — Ambulatory Visit (INDEPENDENT_AMBULATORY_CARE_PROVIDER_SITE_OTHER): Payer: Medicare Other | Admitting: Nurse Practitioner

## 2020-01-20 ENCOUNTER — Telehealth (INDEPENDENT_AMBULATORY_CARE_PROVIDER_SITE_OTHER): Payer: Self-pay | Admitting: Nurse Practitioner

## 2020-01-20 ENCOUNTER — Encounter: Payer: Self-pay | Admitting: Nurse Practitioner

## 2020-01-20 VITALS — BP 100/70 | HR 54 | Temp 98.9°F | Ht 61.0 in | Wt 200.2 lb

## 2020-01-20 DIAGNOSIS — R5383 Other fatigue: Secondary | ICD-10-CM

## 2020-01-20 DIAGNOSIS — R0602 Shortness of breath: Secondary | ICD-10-CM

## 2020-01-20 DIAGNOSIS — N949 Unspecified condition associated with female genital organs and menstrual cycle: Secondary | ICD-10-CM

## 2020-01-20 DIAGNOSIS — R82998 Other abnormal findings in urine: Secondary | ICD-10-CM

## 2020-01-20 DIAGNOSIS — J029 Acute pharyngitis, unspecified: Secondary | ICD-10-CM | POA: Diagnosis not present

## 2020-01-20 DIAGNOSIS — H6690 Otitis media, unspecified, unspecified ear: Secondary | ICD-10-CM | POA: Diagnosis not present

## 2020-01-20 LAB — POCT URINALYSIS DIPSTICK
Glucose, UA: NEGATIVE
Nitrite, UA: NEGATIVE
Protein, UA: NEGATIVE
Spec Grav, UA: 1.02 (ref 1.010–1.025)
Urobilinogen, UA: 0.2 E.U./dL
pH, UA: 5 (ref 5.0–8.0)

## 2020-01-20 MED ORDER — FLUCONAZOLE 150 MG PO TABS: 150.0000 mg | ORAL_TABLET | Freq: Once | ORAL | 0 refills | Status: AC

## 2020-01-20 MED ORDER — SULFAMETHOXAZOLE-TRIMETHOPRIM 800-160 MG PO TABS: 1.0000 | ORAL_TABLET | Freq: Two times a day (BID) | ORAL | 0 refills | Status: DC

## 2020-01-20 MED ORDER — FLUCONAZOLE 150 MG PO TABS: 150.0000 mg | ORAL_TABLET | Freq: Once | ORAL | 0 refills | Status: DC

## 2020-01-20 NOTE — Progress Notes (Signed)
Subjective:  Patient ID: Annette Saunders, female    DOB: Nov 09, 1945  Age: 74 y.o. MRN: [id number]  CC:  Chief Complaint  Patient presents with  . Other    Fatigue, bilateral otitis media, pharyngitis, shortness of breath, vaginal burning      HPI  This patient arrives today for office visit for the above.  Fatigue: She has had longstanding chronic fatigue which have been treating with NP thyroid off label.  She is currently taking 120 mg of NP thyroid daily, but is concerned because she heard about a recall on the medication.  She tells me that her fatigue continues and is quite bothersome for her.  She has already undergone cardiac echocardiogram which did not show any remarkable abnormalities.  Last blood work collected about 6 weeks ago showed normal renal function as well as that she is not anemic.  Bilateral otitis media/pharyngitis: She was seen in urgent care approximately 9 days ago for ear pain and throat pain.  She was tested for strep and Covid both of which came back negative.  She was started on amoxicillin for her bilateral ear infections, was also given steroid shots, told to take Tessalon Perles, and ibuprofen as needed.  She tells me she stopped her antibiotic after taking for a couple of days, because she did not see any symptom improvement.  Tells me today, she has noticed that her symptoms are better despite stopping the antibiotic.  Shortness of breath: She has had shortness of breath in the past and she has been evaluated for this, but tells me today that she is no longer experiencing any shortness of breath.  Vaginal burning: She is having some vaginal burning and itching, she denies any urinary frequency, dysuria, or hematuria.   Past Medical History:  Diagnosis Date  . Anxiety   . Essential hypertension, benign 06/06/2019  . Generalized osteoarthritis   . GERD (gastroesophageal reflux disease)   . Hyperlipidemia   . Hypothyroidism, adult 06/06/2019  .  Insomnia   . Malaise and fatigue 06/06/2019  . Obesity (BMI 30.0-34.9) 06/06/2019      Family History  Problem Relation Age of Onset  . Colon cancer Neg Hx     Social History   Social History Narrative   Widow for 51 years-husband died of MI.Lives alone.   Social History   Tobacco Use  . Smoking status: Never Smoker  . Smokeless tobacco: Never Used  Substance Use Topics  . Alcohol use: No    Alcohol/week: 0.0 standard drinks     Current Meds  Medication Sig  . Ascorbic Acid (VITAMIN C) 1000 MG tablet Take 1,000 mg by mouth daily.  . Cholecalciferol (VITAMIN D) 50 MCG (2000 UT) tablet Take 2,000 Units by mouth daily.   [name] dicyclomine (BENTYL) 10 MG capsule Take 1 capsule (10 mg total) by mouth 2 (two) times daily as needed. (Patient taking differently: Take 10 mg by mouth 2 (two) times daily. )  . ELDERBERRY PO Take 1 tablet by mouth daily.  [name] escitalopram (LEXAPRO) 10 MG tablet Take 1 tablet (10 mg total) by mouth daily.  . famotidine (PEPCID) 20 MG tablet Take 1 tablet (20 mg total) by mouth at bedtime.  [name] ibuprofen (ADVIL) 800 MG tablet Take 1 tablet (800 mg total) by mouth 2 (two) times daily as needed for moderate pain.  . pantoprazole (PROTONIX) 40 MG tablet TAKE 1 TABLET BY MOUTH TWICE DAILY 30 MINUTES BEFORE A MEAL (Patient taking differently: Take  40 mg by mouth 2 (two) times daily before a meal. )  . progesterone (PROMETRIUM) 200 MG capsule TAKE 3 CAPSULES BY MOUTH EVERY NIGHT (Patient taking differently: Take 600 mg by mouth daily. )  . senna-docusate (SENOKOT-S) 8.6-50 MG tablet Take 1 tablet by mouth at bedtime as needed for mild constipation.  [name] thyroid (NP THYROID) 120 MG tablet Take 1 tablet (120 mg total) by mouth 2 (two) times daily. (Patient taking differently: Take 120 mg by mouth daily before breakfast. )  . zinc gluconate 50 MG tablet Take 50 mg by mouth daily.  . [DISCONTINUED] acetaminophen-codeine (TYLENOL #3) 300-30 MG tablet Take 1 tablet by mouth  every 4 (four) hours as needed for moderate pain.    ROS:  Review of Systems  Constitutional: Negative for chills and fever.  Respiratory: Negative for cough and shortness of breath.   Cardiovascular: Negative for chest pain and palpitations.  Genitourinary: Negative for frequency, hematuria and urgency.       (+) vaginal burning  Neurological: Negative for dizziness and headaches.     Objective:   Today's Vitals: BP 100/70 (BP Location: Left Arm, Patient Position: Sitting, Cuff Size: Normal)   Pulse (!) 54   Temp 98.9 F (37.2 C) (Temporal)   Ht '5\' 1"'$  (1.549 m)   Wt 200 lb 3.2 oz (90.8 kg)   SpO2 97%   BMI 37.83 kg/m  Vitals with BMI 01/20/2020 01/11/2020 12/20/2019  Height '5\' 1"'$  - -  Weight 200 lbs 3 oz - -  BMI 82.99 - -  Systolic 371 696 789  Diastolic 70 68 82  Pulse 54 93 125     Physical Exam Vitals reviewed.  Constitutional:      General: She is not in acute distress.    Appearance: Normal appearance.  HENT:     Head: Normocephalic and atraumatic.     Right Ear: Tympanic membrane and ear canal normal. No swelling or tenderness. No middle ear effusion. Tympanic membrane is not erythematous or bulging.     Left Ear: Tympanic membrane and ear canal normal. No swelling or tenderness.  No middle ear effusion. Tympanic membrane is not erythematous or bulging.     Mouth/Throat:     Mouth: Mucous membranes are moist. No oral lesions.     Pharynx: Uvula midline. No pharyngeal swelling, oropharyngeal exudate or posterior oropharyngeal erythema.     Tonsils: No tonsillar exudate.  Neck:     Vascular: No carotid bruit.  Cardiovascular:     Rate and Rhythm: Normal rate and regular rhythm.     Pulses: Normal pulses.     Heart sounds: Normal heart sounds.  Pulmonary:     Effort: Pulmonary effort is normal.     Breath sounds: Normal breath sounds.  Genitourinary:    Comments: Vaginal exam not conducted today as I do not have necessary equipment to perform internal  vaginal exam at this office. Lymphadenopathy:     Cervical: No cervical adenopathy.  Skin:    General: Skin is warm and dry.  Neurological:     General: No focal deficit present.     Mental Status: She is alert and oriented to person, place, and time.  Psychiatric:        Mood and Affect: Mood normal.        Behavior: Behavior normal.        Judgment: Judgment normal.          Assessment and Plan   1. Vaginal burning  2. Fatigue, unspecified type   3. Acute otitis media, unspecified otitis media type   4. Shortness of breath   5. Pharyngitis, unspecified etiology   6. Urine leukocytes      Plan: 1., 6.  POC urinalysis did show some leukocytes, I am thinking she may have vaginal yeast infection as well.  I will prescribe her Diflucan empirically and will send her prescription for Bactrim for UTI.  I will send her urine off for culture.  She is encouraged that me know if her symptoms persist or do not improve.  2.  We will collect blood work today for further evaluation.  We did consider getting a vitamin B12 level, however insurance is giving [place] kickback.  Thus I let her know she could consider trialing an over-the-counter vitamin B12 supplement, I recommend she take 500 mcg/day by mouth if she is can try this.  I told her if she does not feel any improvement in her energy levels within a couple weeks she should stop the supplement.  She tells me she understands.  I did also discussed that sometimes etiology of fatigue can be difficult to determine.  She tells me she understands.  We will await blood work results before making further recommendations.  3.,  5.  This seems to be resolving if not resolved based on the fact that her symptoms are improving and physical exam did not show any signs of infection at this time.  I think she probably had a viral infection, she was encouraged to me know if the symptoms persist, worsen, or return.  No further treatment will be recommended at  this time.  4.  Resolved for the time being.  No further work-up will be completed at this time.  Tests ordered Orders Placed This Encounter  Procedures  . TSH  . T3, Free  . T4, Free  . CMP with eGFR([facility])  . Urinalysis with Culture Reflex  . POC Urinalysis Dipstick      Meds ordered this encounter  Medications  . DISCONTD: fluconazole (DIFLUCAN) 150 MG tablet    Sig: Take 1 tablet (150 mg total) by mouth once for 1 dose.    Dispense:  1 tablet    Refill:  0    Order Specific Question:   Supervising Provider    Answer:   Anastasio Champion, NIMISH C [[id number]]  . fluconazole (DIFLUCAN) 150 MG tablet    Sig: Take 1 tablet (150 mg total) by mouth once for 1 dose.    Dispense:  1 tablet    Refill:  0    Order Specific Question:   Supervising Provider    Answer:   Anastasio Champion, NIMISH C [[id number]]  . sulfamethoxazole-trimethoprim (BACTRIM DS) 800-160 MG tablet    Sig: Take 1 tablet by mouth 2 (two) times daily.    Dispense:  6 tablet    Refill:  0    Order Specific Question:   Supervising Provider    Answer:   Doree Albee [[id number]]    Patient to follow-up in 3 months or sooner as needed.  Ailene Ards, NP

## 2020-01-20 NOTE — Telephone Encounter (Signed)
Annette Saunders, please call patient and let her know that her urine did show some white blood cells which could represent a urinary tract infection.  I have sent an antibiotic called Bactrim to her pharmacy.  I am also can send her urine off for culture.  She should take her Diflucan as prescribed, and I would also recommend that she take Bactrim 1 tablet by mouth twice a day for 3 days for possible UTI.  If her urine culture comes back showing that we need to change to a different antibiotic I will let her know.  The culture often will take 48 to 72 hours to result.  Please warn her of side effects from Bactrim which can include sensitivity to sunlight and increased risk of sun burn, GI upset, and rarely a serious life-threatening rash, thus if she develops a rash she should stop the medication right away, she should make sure to use sunscreen while in the sun, and consider taking her antibiotic with food to protect her stomach.  If she has any questions please let me know.

## 2020-01-20 NOTE — Telephone Encounter (Signed)
Annette Saunders is aware of the recommendations

## 2020-01-22 ENCOUNTER — Other Ambulatory Visit (INDEPENDENT_AMBULATORY_CARE_PROVIDER_SITE_OTHER): Payer: Self-pay | Admitting: Nurse Practitioner

## 2020-01-22 DIAGNOSIS — R059 Cough, unspecified: Secondary | ICD-10-CM

## 2020-01-22 LAB — COMPLETE METABOLIC PANEL WITH GFR
AG Ratio: 1.3 (calc) (ref 1.0–2.5)
ALT: 12 U/L (ref 6–29)
AST: 14 U/L (ref 10–35)
Albumin: 3.5 g/dL — ABNORMAL LOW (ref 3.6–5.1)
Alkaline phosphatase (APISO): 89 U/L (ref 37–153)
BUN: 16 mg/dL (ref 7–25)
CO2: 24 mmol/L (ref 20–32)
Calcium: 9.5 mg/dL (ref 8.6–10.4)
Chloride: 108 mmol/L (ref 98–110)
Creat: 0.87 mg/dL (ref 0.60–0.93)
GFR, Est African American: 77 mL/min/{1.73_m2} (ref >=60)
GFR, Est Non African American: 66 mL/min/{1.73_m2} (ref >=60)
Globulin: 2.8 g/dL (calc) (ref 1.9–3.7)
Glucose, Bld: 98 mg/dL (ref 65–99)
Potassium: 5 mmol/L (ref 3.5–5.3)
Sodium: 140 mmol/L (ref 135–146)
Total Bilirubin: 0.3 mg/dL (ref 0.2–1.2)
Total Protein: 6.3 g/dL (ref 6.1–8.1)

## 2020-01-22 LAB — URINE CULTURE

## 2020-01-22 LAB — URINALYSIS W MICROSCOPIC + REFLEX CULTURE
Bacteria, UA: NONE SEEN /HPF
Bilirubin Urine: NEGATIVE
Glucose, UA: NEGATIVE
Hgb urine dipstick: NEGATIVE
Hyaline Cast: NONE SEEN /LPF
Ketones, ur: NEGATIVE
Nitrites, Initial: NEGATIVE
Protein, ur: NEGATIVE
RBC / HPF: NONE SEEN /HPF (ref 0–2)
Specific Gravity, Urine: 1.018 (ref 1.001–1.03)
pH: 6 (ref 5.0–8.0)

## 2020-01-22 LAB — T4, FREE: Free T4: 1.1 ng/dL (ref 0.8–1.8)

## 2020-01-22 LAB — TSH: TSH: 0.01 mIU/L — ABNORMAL LOW (ref 0.40–4.50)

## 2020-01-22 LAB — CULTURE INDICATED

## 2020-01-22 LAB — T3, FREE: T3, Free: 2.7 pg/mL (ref 2.3–4.2)

## 2020-01-22 MED ORDER — BENZONATATE 100 MG PO CAPS: 100.0000 mg | ORAL_CAPSULE | Freq: Two times a day (BID) | ORAL | 0 refills | Status: DC | PRN

## 2020-01-27 ENCOUNTER — Other Ambulatory Visit: Payer: Self-pay | Admitting: Gastroenterology

## 2020-01-29 ENCOUNTER — Telehealth (INDEPENDENT_AMBULATORY_CARE_PROVIDER_SITE_OTHER): Payer: Self-pay | Admitting: Internal Medicine

## 2020-01-29 ENCOUNTER — Other Ambulatory Visit (INDEPENDENT_AMBULATORY_CARE_PROVIDER_SITE_OTHER): Payer: Self-pay | Admitting: Internal Medicine

## 2020-01-29 NOTE — Telephone Encounter (Signed)
I already sent this today to the [facility] on [address].  Thanks.

## 2020-02-07 ENCOUNTER — Other Ambulatory Visit: Payer: Self-pay | Admitting: Gastroenterology

## 2020-02-12 ENCOUNTER — Ambulatory Visit (INDEPENDENT_AMBULATORY_CARE_PROVIDER_SITE_OTHER): Payer: Medicare Other | Admitting: Gastroenterology

## 2020-02-12 ENCOUNTER — Other Ambulatory Visit: Payer: Self-pay

## 2020-02-12 ENCOUNTER — Encounter: Payer: Self-pay | Admitting: Gastroenterology

## 2020-02-12 VITALS — BP 153/93 | HR 91 | Temp 96.9°F | Ht 61.0 in | Wt 201.6 lb

## 2020-02-12 DIAGNOSIS — K219 Gastro-esophageal reflux disease without esophagitis: Secondary | ICD-10-CM

## 2020-02-12 MED ORDER — FAMOTIDINE 20 MG PO TABS: 20.0000 mg | ORAL_TABLET | Freq: Every day | ORAL | 3 refills | Status: DC

## 2020-02-12 NOTE — Patient Instructions (Addendum)
Let's stop the dicyclomine for now. Call me if you have abdominal cramping with looser stool. We will try something else out if needed.   Continue Protonix twice a day, and I have sent in Pepcid in evenings as needed.   We will see you in 6 months!   I enjoyed seeing you again today! As you know, I value our relationship and want to provide genuine, compassionate, and quality care. I welcome your feedback. If you receive a survey regarding your visit,  I greatly appreciate you taking time to fill this out. See you next time!  Annitta Needs, PhD, ANP-BC [facility]

## 2020-02-12 NOTE — Progress Notes (Signed)
Referring Provider: Doree Albee, MD Primary Care Physician:  Doree Albee, MD  Primary GI: previously Dr. Oneida Alar   Chief Complaint  Patient presents with  . Follow-up    Jerrye Bushy, seems much better    HPI:   Annette Saunders is a 74 y.o. female presenting today with a history of GERD, dysphagia, last EGD several years ago at outside facility but unable to obtain. Issues with dysphagia intermittently; BPE April 2019 with prior fundoplication without recurrent hiatal hernia, otherwise normal.In interim from last visit, she was hospitalized March 2021 with ileus vs SBO. Managed conservatively.   Protonix BID. Pepcid in evening. No dysphagia. Every once in awhile lower lateral abdominal discomfort. Chronic. Mostly at nights when relaxed. No constipation or diarrhea. No overt GI bleeding. No dysphagia. Overall doing much better. She has been taking dicyclomine.   Past Medical History:  Diagnosis Date  . Anxiety   . Essential hypertension, benign 06/06/2019  . Generalized osteoarthritis   . GERD (gastroesophageal reflux disease)   . Hyperlipidemia   . Hypothyroidism, adult 06/06/2019  . Insomnia   . Malaise and fatigue 06/06/2019  . Obesity (BMI 30.0-34.9) 06/06/2019    Past Surgical History:  Procedure Laterality Date  . COLONOSCOPY  10/2014   Dr. Cristine Polio, outside hospital. Colonoscopy performed with Propofol, no polyps, small to medium size internal hemorrhoids noted.   [name] ECTOPIC PREGNANCY SURGERY    . ELBOW FRACTURE SURGERY    . HIATAL HERNIA REPAIR    . JOINT REPLACEMENT     bilateral knee  . TONSILLECTOMY      Current Outpatient Medications  Medication Sig Dispense Refill  . Ascorbic Acid (VITAMIN C) 1000 MG tablet Take 1,000 mg by mouth daily.    . Cholecalciferol (VITAMIN D) 50 MCG (2000 UT) tablet Take 2,000 Units by mouth daily.     [name] dicyclomine (BENTYL) 10 MG capsule TAKE 1 CAPSULE(10 MG) BY MOUTH TWICE DAILY AS NEEDED 120 capsule 1  . ELDERBERRY PO Take 1  tablet by mouth daily.    [name] escitalopram (LEXAPRO) 10 MG tablet Take 1 tablet (10 mg total) by mouth daily. 90 tablet 0  . pantoprazole (PROTONIX) 40 MG tablet TAKE 1 TABLET BY MOUTH TWICE DAILY 30 MINUTES BEFORE A MEAL 60 tablet 5  . progesterone (PROMETRIUM) 200 MG capsule TAKE 3 CAPSULES BY MOUTH EVERY NIGHT 90 capsule 3  . thyroid (NP THYROID) 120 MG tablet Take 1 tablet (120 mg total) by mouth 2 (two) times daily. (Patient taking differently: Take 120 mg by mouth daily before breakfast. ) 60 tablet 3  . zinc gluconate 50 MG tablet Take 50 mg by mouth daily.    . famotidine (PEPCID) 20 MG tablet Take 1 tablet (20 mg total) by mouth at bedtime. 90 tablet 3   No current facility-administered medications for this visit.    Allergies as of 02/12/2020 - Review Complete 02/12/2020  Allergen Reaction Noted  . Dilantin [phenytoin sodium extended] Other (See Comments) 03/28/2019  . Doxycycline hyclate  12/27/2018    Family History  Problem Relation Age of Onset  . Colon cancer Neg Hx     Social History   Socioeconomic History  . Marital status: Widowed    Spouse name: Not on file  . Number of children: Not on file  . Years of education: Not on file  . Highest education level: Not on file  Occupational History  . Occupation: retired  Tobacco Use  . Smoking status: Never Smoker  .  Smokeless tobacco: Never Used  Vaping Use  . Vaping Use: Never assessed  Substance and Sexual Activity  . Alcohol use: No    Alcohol/week: 0.0 standard drinks  . Drug use: No  . Sexual activity: Not on file  Other Topics Concern  . Not on file  Social History Narrative   Widow for 41 years-husband died of MI.Lives alone.   Social Determinants of Health   Financial Resource Strain:   . Difficulty of Paying Living Expenses:   Food Insecurity:   . Worried About Charity fundraiser in the Last Year:   . Arboriculturist in the Last Year:   Transportation Needs:   . Film/video editor  (Medical):   [name] Lack of Transportation (Non-Medical):   Physical Activity:   . Days of Exercise per Week:   . Minutes of Exercise per Session:   Stress:   . Feeling of Stress :   Social Connections:   . Frequency of Communication with Friends and Family:   . Frequency of Social Gatherings with Friends and Family:   . Attends Religious Services:   . Active Member of Clubs or Organizations:   . Attends Archivist Meetings:   [name] Marital Status:     Review of Systems: Gen: Denies fever, chills, anorexia. Denies fatigue, weakness, weight loss.  CV: Denies chest pain, palpitations, syncope, peripheral edema, and claudication. Resp: Denies dyspnea at rest, cough, wheezing, coughing up blood, and pleurisy. GI: see HPI Derm: Denies rash, itching, dry skin Psych: Denies depression, anxiety, memory loss, confusion. No homicidal or suicidal ideation.  Heme: Denies bruising, bleeding, and enlarged lymph nodes.  Physical Exam: BP (!) 153/93   Pulse 91   Temp (!) 96.9 F (36.1 C) (Temporal)   Ht 5\' 1"  (1.549 m)   Wt 201 lb 9.6 oz (91.4 kg)   BMI 38.09 kg/m  General:   Alert and oriented. No distress noted. Pleasant and cooperative.  Head:  Normocephalic and atraumatic. Eyes:  Conjuctiva clear without scleral icterus. Mouth:  Mask in place Abdomen:  +BS, soft, non-tender and non-distended. No rebound or guarding. No HSM or masses noted. Msk:  Symmetrical without gross deformities. Normal posture. Extremities:  Without edema. Neurologic:  Alert and  oriented x4 Psych:  Alert and cooperative. Normal mood and affect.  ASSESSMENT: Annette Saunders is a 74 y.o. female presenting today with history of chronic GERD, intermittent dysphagia, with recent hospitalization March 2021 for ileus, possible SBO that was managed conservatively.  GERD has been well managed with Protonix BID, and dysphagia has resolved.   She has noted vague lower abdominal discomfort bilaterally that is only  noted when laying still; no concerning findings on exam. This is chronic and has no other associated factors. CT on file from hospitalization in March 2021. Will monitor for now.  I have asked her to stop dicyclomine for now. She will call if any recurrent frequent stools, which she has had in the past.    PLAN:  Discontinue dicyclomine  PPI BID  Return in 6 months   Annitta Needs, PhD, [facility]

## 2020-02-17 NOTE — Progress Notes (Signed)
Cc'ed to pcp °

## 2020-02-18 ENCOUNTER — Telehealth: Payer: Self-pay | Admitting: Internal Medicine

## 2020-02-18 NOTE — Telephone Encounter (Signed)
PATIENT CALLED TO TELL ANNA THAT HER SIDES HURT AND IS HAVING A LITTLE DIARRHEA AGAIN

## 2020-02-19 NOTE — Telephone Encounter (Signed)
Called lmom

## 2020-02-19 NOTE — Telephone Encounter (Signed)
Called pt verified name and dob Pt stated she is having bilateral flank pain. She states it has been going on for about 5 years but it has gotten a little worse and so has the diarrhea since she was taken off of dicyclomine. She denies any blood in stool or fever but states her diarrhea is about 2 times a day and it is worse at night. Pt states it has gotten so bad she thought about going to the er last night but she wants to know is she can start taking the dicyclomine or if something else can be sent into her pharmacy

## 2020-02-19 NOTE — Telephone Encounter (Signed)
Called pt notified her of providers recommendations. Pt stated she will take dicyclomine monitor for constipation

## 2020-02-19 NOTE — Telephone Encounter (Signed)
Yes, she can take dicyclomine as needed but monitor for constipation. Thank her for calling, as we were just seeing how she would do without it. If pain worsens, please call.

## 2020-03-24 ENCOUNTER — Encounter: Payer: Self-pay | Admitting: Gastroenterology

## 2020-03-24 ENCOUNTER — Ambulatory Visit (INDEPENDENT_AMBULATORY_CARE_PROVIDER_SITE_OTHER): Payer: Medicare Other | Admitting: Gastroenterology

## 2020-03-24 ENCOUNTER — Encounter: Payer: Self-pay | Admitting: *Deleted

## 2020-03-24 ENCOUNTER — Other Ambulatory Visit: Payer: Self-pay

## 2020-03-24 VITALS — BP 149/85 | HR 105 | Temp 97.9°F | Ht 61.0 in | Wt 198.2 lb

## 2020-03-24 DIAGNOSIS — R1310 Dysphagia, unspecified: Secondary | ICD-10-CM | POA: Diagnosis not present

## 2020-03-24 DIAGNOSIS — R1030 Lower abdominal pain, unspecified: Secondary | ICD-10-CM | POA: Diagnosis not present

## 2020-03-24 MED ORDER — ESOMEPRAZOLE MAGNESIUM 40 MG PO CPDR: 40.0000 mg | DELAYED_RELEASE_CAPSULE | Freq: Every day | ORAL | 3 refills | Status: DC

## 2020-03-24 NOTE — Patient Instructions (Signed)
Let's stop Protonix. I have sent in Nexium to take 30 minutes before eating on an empty stomach.  We are arranging a CT scan to further evaluate your pain.  We have also arranged an endoscopy with dilation in the future by Dr. Abbey Chatters.  Let's keep the appointment in December as planned to see you back!  I enjoyed seeing you again today! As you know, I value our relationship and want to provide genuine, compassionate, and quality care. I welcome your feedback. If you receive a survey regarding your visit,  I greatly appreciate you taking time to fill this out. See you next time!  Annitta Needs, PhD, ANP-BC [facility]

## 2020-03-24 NOTE — Progress Notes (Signed)
Referring Provider: Doree Albee, MD Primary Care Physician:  Doree Albee, MD Primary GI: Dr. Abbey Chatters  Chief Complaint  Patient presents with  . Abdominal Pain    right and left lower  . Dysphagia    HPI:   Annette Saunders is a 74 y.o. female presenting today with a history of GERD, dysphagia, last last EGD several years ago at outside facility but unable to obtain. Issues with dysphagia intermittently; BPE April 2019 with prior fundoplication without recurrent hiatal hernia, otherwise normal.March 2021 with ileus vs SBO.   Bilateral lower quadrant abdominal pain intermittently, mainly at night notices this. Comes and goes. Sometimes during the day. Night at worse. Urinates at night then pain relieved. Bentyl BID. Unable to come off of this. No constipation or diarrhea. BM daily.   Sometimes food gets stuck. Feels globus sensation. Difficulty with steak. Nexium helps her. Taking Protonix BID.   Past Medical History:  Diagnosis Date  . Anxiety   . Essential hypertension, benign 06/06/2019  . Generalized osteoarthritis   . GERD (gastroesophageal reflux disease)   . Hyperlipidemia   . Hypothyroidism, adult 06/06/2019  . Insomnia   . Malaise and fatigue 06/06/2019  . Obesity (BMI 30.0-34.9) 06/06/2019    Past Surgical History:  Procedure Laterality Date  . COLONOSCOPY  10/2014   Dr. Cristine Polio, outside hospital. Colonoscopy performed with Propofol, no polyps, small to medium size internal hemorrhoids noted.   [name] ECTOPIC PREGNANCY SURGERY    . ELBOW FRACTURE SURGERY    . HIATAL HERNIA REPAIR    . JOINT REPLACEMENT     bilateral knee  . TONSILLECTOMY      Current Outpatient Medications  Medication Sig Dispense Refill  . Ascorbic Acid (VITAMIN C) 1000 MG tablet Take 1,000 mg by mouth daily.    . Cholecalciferol (VITAMIN D) 50 MCG (2000 UT) tablet Take 2,000 Units by mouth daily.     [name] dicyclomine (BENTYL) 10 MG capsule TAKE 1 CAPSULE(10 MG) BY MOUTH TWICE DAILY AS  NEEDED 120 capsule 1  . ELDERBERRY PO Take 1 tablet by mouth daily.    [name] escitalopram (LEXAPRO) 10 MG tablet Take 1 tablet (10 mg total) by mouth daily. 90 tablet 0  . famotidine (PEPCID) 20 MG tablet Take 1 tablet (20 mg total) by mouth at bedtime. 90 tablet 3  . pantoprazole (PROTONIX) 40 MG tablet TAKE 1 TABLET BY MOUTH TWICE DAILY 30 MINUTES BEFORE A MEAL 60 tablet 5  . progesterone (PROMETRIUM) 200 MG capsule TAKE 3 CAPSULES BY MOUTH EVERY NIGHT 90 capsule 3  . thyroid (NP THYROID) 120 MG tablet Take 1 tablet (120 mg total) by mouth 2 (two) times daily. 60 tablet 3  . zinc gluconate 50 MG tablet Take 50 mg by mouth daily.     No current facility-administered medications for this visit.    Allergies as of 03/24/2020 - Review Complete 03/24/2020  Allergen Reaction Noted  . Dilantin [phenytoin sodium extended] Other (See Comments) 03/28/2019  . Doxycycline hyclate  12/27/2018    Family History  Problem Relation Age of Onset  . Colon cancer Neg Hx     Social History   Socioeconomic History  . Marital status: Widowed    Spouse name: Not on file  . Number of children: Not on file  . Years of education: Not on file  . Highest education level: Not on file  Occupational History  . Occupation: retired  Tobacco Use  . Smoking status: Never Smoker  .  Smokeless tobacco: Never Used  Vaping Use  . Vaping Use: Never assessed  Substance and Sexual Activity  . Alcohol use: No    Alcohol/week: 0.0 standard drinks  . Drug use: No  . Sexual activity: Not on file  Other Topics Concern  . Not on file  Social History Narrative   Widow for 52 years-husband died of MI.Lives alone.   Social Determinants of Health   Financial Resource Strain:   . Difficulty of Paying Living Expenses:   Food Insecurity:   . Worried About Charity fundraiser in the Last Year:   . Arboriculturist in the Last Year:   Transportation Needs:   . Film/video editor (Medical):   [name] Lack of Transportation  (Non-Medical):   Physical Activity:   . Days of Exercise per Week:   . Minutes of Exercise per Session:   Stress:   . Feeling of Stress :   Social Connections:   . Frequency of Communication with Friends and Family:   . Frequency of Social Gatherings with Friends and Family:   . Attends Religious Services:   . Active Member of Clubs or Organizations:   . Attends Archivist Meetings:   [name] Marital Status:     Review of Systems: Gen: Denies fever, chills, anorexia. Denies fatigue, weakness, weight loss.  CV: Denies chest pain, palpitations, syncope, peripheral edema, and claudication. Resp: Denies dyspnea at rest, cough, wheezing, coughing up blood, and pleurisy. GI: see HPI Derm: Denies rash, itching, dry skin Psych: Denies depression, anxiety, memory loss, confusion. No homicidal or suicidal ideation.  Heme: Denies bruising, bleeding, and enlarged lymph nodes.  Physical Exam: BP (!) 149/85   Pulse 105   Temp 97.9 F (36.6 C) (Oral)   Ht 5\' 1"  (1.549 m)   Wt 198 lb 3.2 oz (89.9 kg)   BMI 37.45 kg/m  General:   Alert and oriented. No distress noted. Pleasant and cooperative.  Head:  Normocephalic and atraumatic. Eyes:  Conjuctiva clear without scleral icterus. Mouth:  Mask in place Abdomen:  +BS, soft, mild TTP lower abdomen bilaterally and non-distended. No rebound or guarding. No HSM or masses noted. Msk:  Symmetrical without gross deformities. Normal posture. Extremities:  Without edema. Neurologic:  Alert and  oriented x4 Psych:  Alert and cooperative. Normal mood and affect.  ASSESSMENT: Annette Saunders is a 74 y.o. female presenting today in follow-up with history of GERD, dysphagia, reporting lower abdominal pain of unclear etiology and persistent globus sensation with solid food dysphagia.   Lower abdominal pain: improvement with Bentyl BID historically and has been able to wean off of this; denying any constipation. Will update imaging with CT. Unclear  etiology. Query musculoskeletal\  Dysphagia with globus sensation: history of fundoplication in the past, last EGD several years ago but have been unable to obtain from outside facility. Will pursue EGD/dilation in the future. Nexium has worked better than Protonix historically.    PLAN:   Proceed with upper endoscopy/dilation in the near future with Dr. Abbey Chatters. The risks, benefits, and alternatives have been discussed in detail with patient. They have stated understanding and desire to proceed.   CT abd/pelvis with contrast  Stop Protonix. Prescription for Nexium sent to pharmacy  Return in December as planned   Annitta Needs, PhD, [facility] Gastro[facility]

## 2020-04-01 NOTE — Progress Notes (Signed)
CC'ED TO PCP 

## 2020-04-03 DIAGNOSIS — R1030 Lower abdominal pain, unspecified: Secondary | ICD-10-CM | POA: Diagnosis not present

## 2020-04-04 LAB — BASIC METABOLIC PANEL WITH GFR
BUN/Creatinine Ratio: 34 (calc) — ABNORMAL HIGH (ref 6–22)
BUN: 29 mg/dL — ABNORMAL HIGH (ref 7–25)
CO2: 23 mmol/L (ref 20–32)
Calcium: 10.2 mg/dL (ref 8.6–10.4)
Chloride: 109 mmol/L (ref 98–110)
Creat: 0.85 mg/dL (ref 0.60–0.93)
GFR, Est African American: 79 mL/min/{1.73_m2} (ref >=60)
GFR, Est Non African American: 68 mL/min/{1.73_m2} (ref >=60)
Glucose, Bld: 108 mg/dL — ABNORMAL HIGH (ref 65–99)
Potassium: 5.1 mmol/L (ref 3.5–5.3)
Sodium: 142 mmol/L (ref 135–146)

## 2020-04-06 ENCOUNTER — Telehealth: Payer: Self-pay

## 2020-04-06 NOTE — Telephone Encounter (Signed)
Called pt, EGD/DIL w/Prop w/Dr. Abbey Chatters scheduled for 05/25/20 at 11:15am. Orders entered. COVID test 05/22/20 at 10:00am. Appt letter mailed with procedure instructions.

## 2020-04-07 DIAGNOSIS — S90862A Insect bite (nonvenomous), left foot, initial encounter: Secondary | ICD-10-CM | POA: Diagnosis not present

## 2020-04-07 DIAGNOSIS — L02821 Furuncle of head [any part, except face]: Secondary | ICD-10-CM | POA: Diagnosis not present

## 2020-04-07 DIAGNOSIS — L700 Acne vulgaris: Secondary | ICD-10-CM | POA: Diagnosis not present

## 2020-04-07 DIAGNOSIS — L905 Scar conditions and fibrosis of skin: Secondary | ICD-10-CM | POA: Diagnosis not present

## 2020-04-07 DIAGNOSIS — B9689 Other specified bacterial agents as the cause of diseases classified elsewhere: Secondary | ICD-10-CM | POA: Diagnosis not present

## 2020-04-07 DIAGNOSIS — Z85828 Personal history of other malignant neoplasm of skin: Secondary | ICD-10-CM | POA: Diagnosis not present

## 2020-04-07 DIAGNOSIS — Z08 Encounter for follow-up examination after completed treatment for malignant neoplasm: Secondary | ICD-10-CM | POA: Diagnosis not present

## 2020-04-09 ENCOUNTER — Other Ambulatory Visit (INDEPENDENT_AMBULATORY_CARE_PROVIDER_SITE_OTHER): Payer: Self-pay | Admitting: Internal Medicine

## 2020-04-09 DIAGNOSIS — F419 Anxiety disorder, unspecified: Secondary | ICD-10-CM

## 2020-04-15 ENCOUNTER — Other Ambulatory Visit (HOSPITAL_COMMUNITY): Payer: Medicare Other

## 2020-04-20 IMAGING — MG DIGITAL DIAGNOSTIC BILAT W/ TOMO W/ CAD
8 series · 8 of 24 positions shown · non-contrast
Comparison: Previous exam(s).

CLINICAL DATA: 74-year-old patient due for annual examination. She
has recently had some breast pain, but that resolved. She has no
current complaints.

EXAM:
DIGITAL DIAGNOSTIC BILATERAL MAMMOGRAM WITH CAD AND TOMO

[L CC synth-2D]
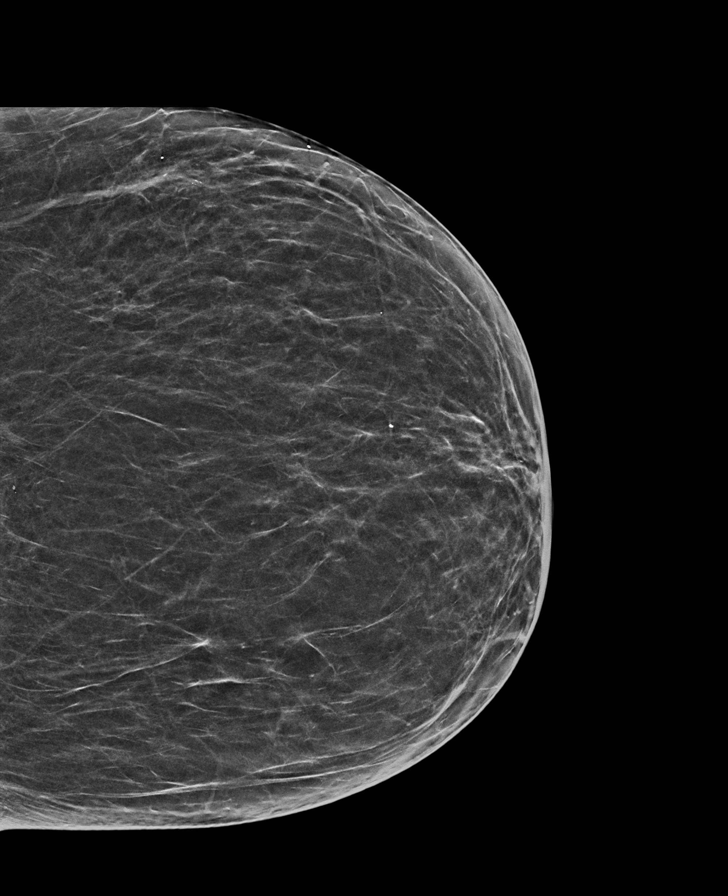

[R CC synth-2D]
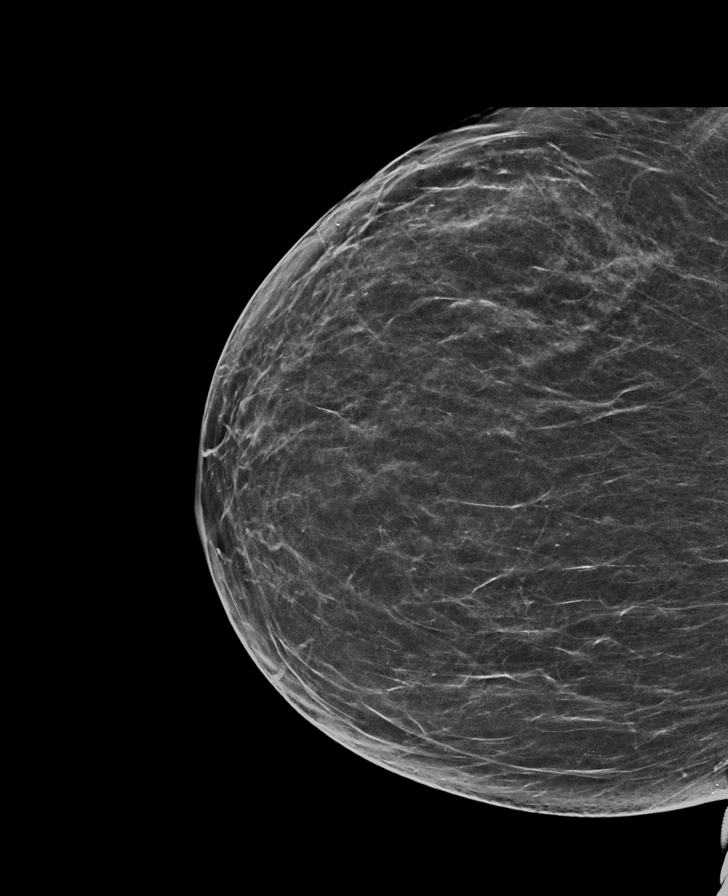

[R MLO synth-2D]
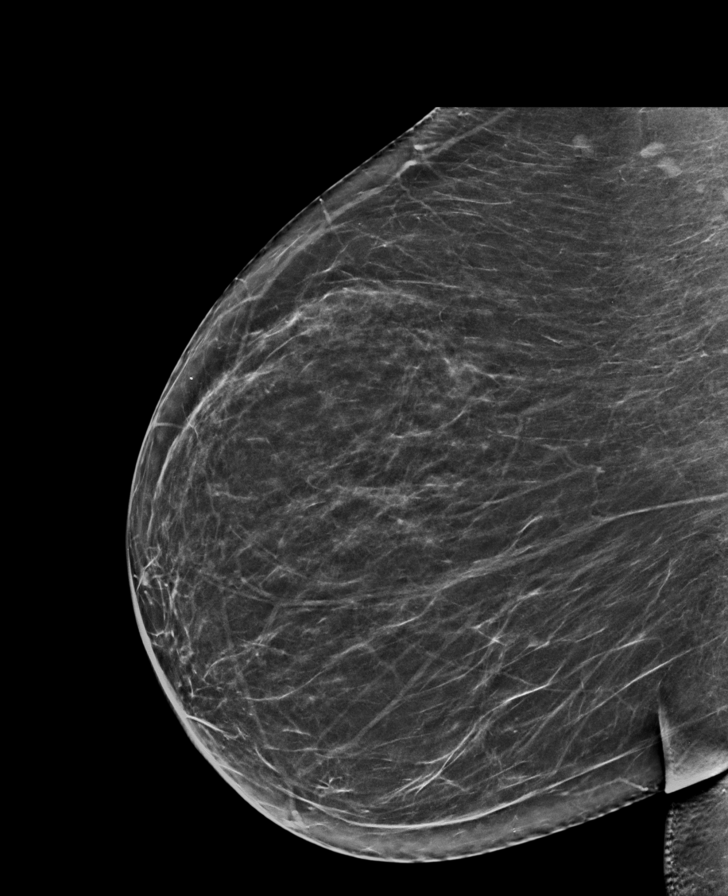

[L MLO synth-2D]
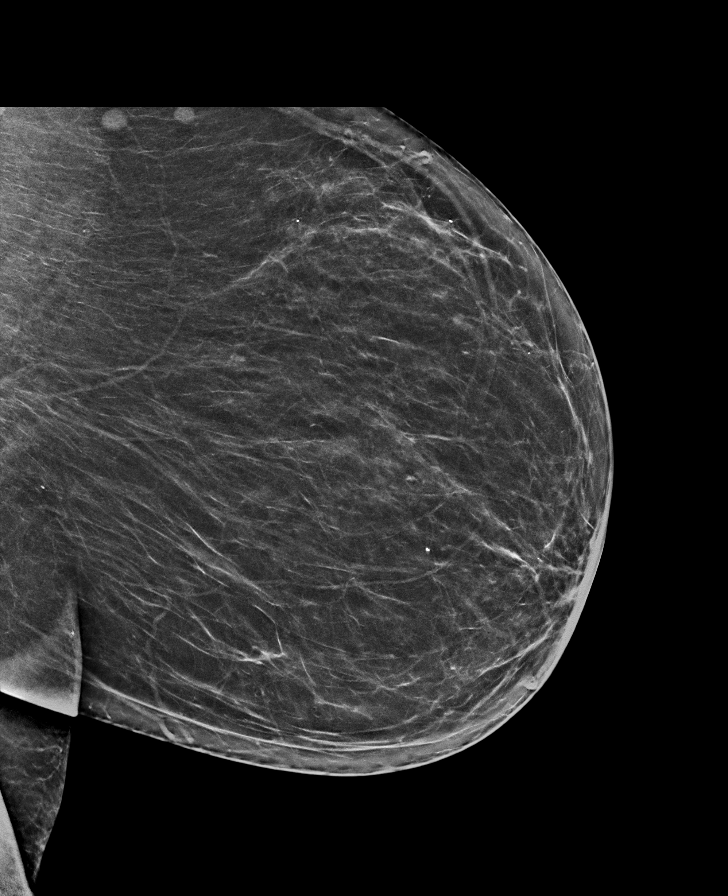

[L MLO tomo · tomo slice 34/67.0]
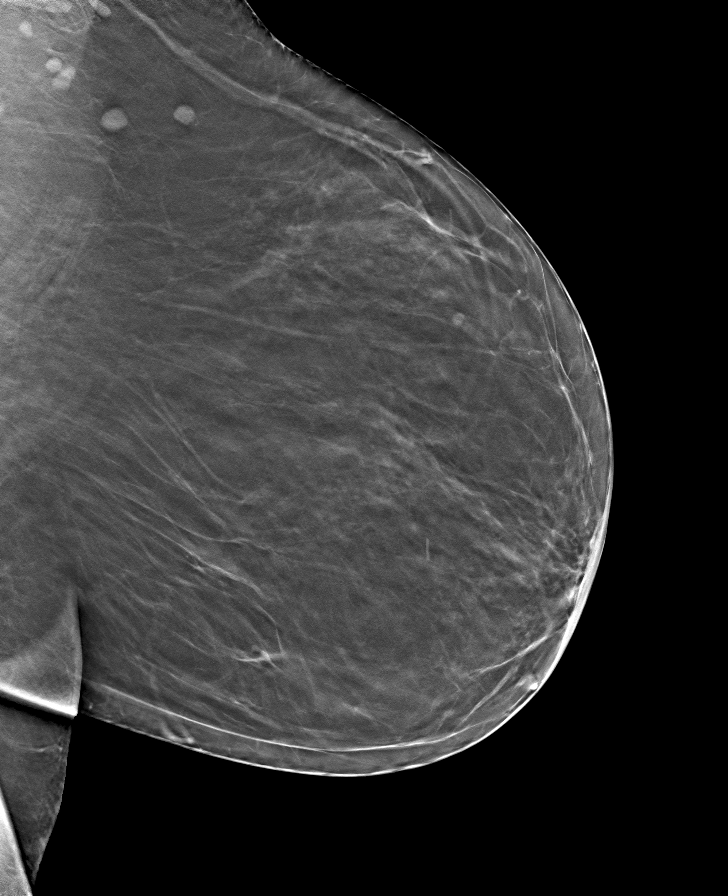

[L CC tomo · tomo slice 31/61.0]
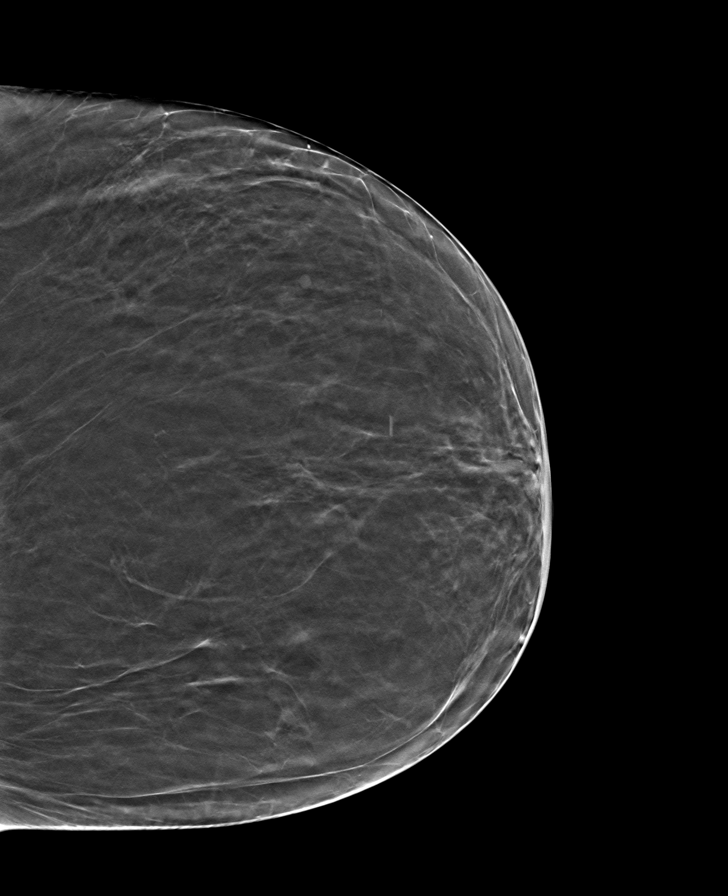

[R CC tomo · tomo slice 34/67.0]
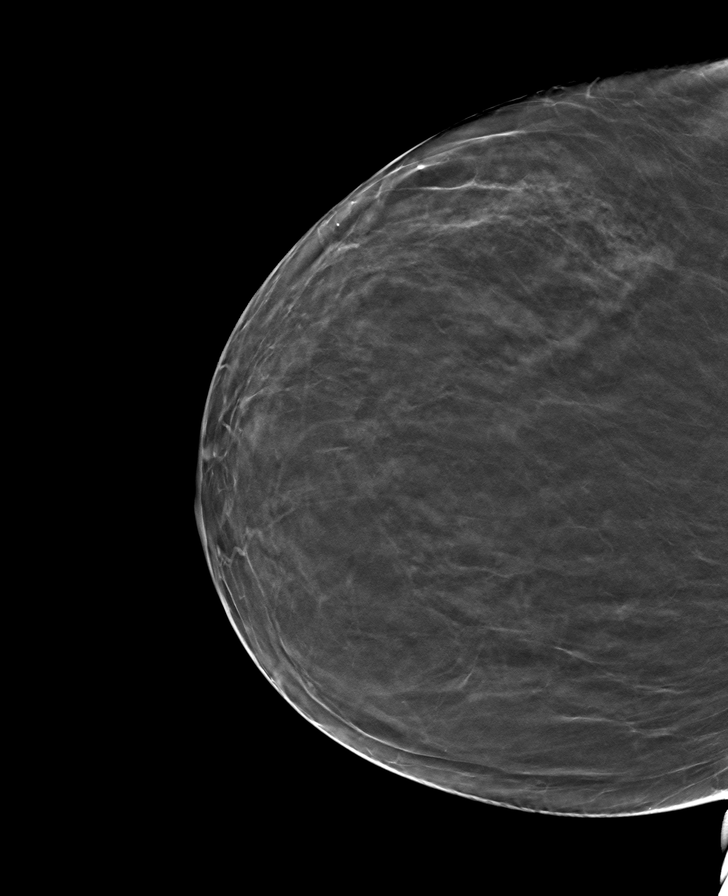

[R MLO tomo · tomo slice 34/67.0]
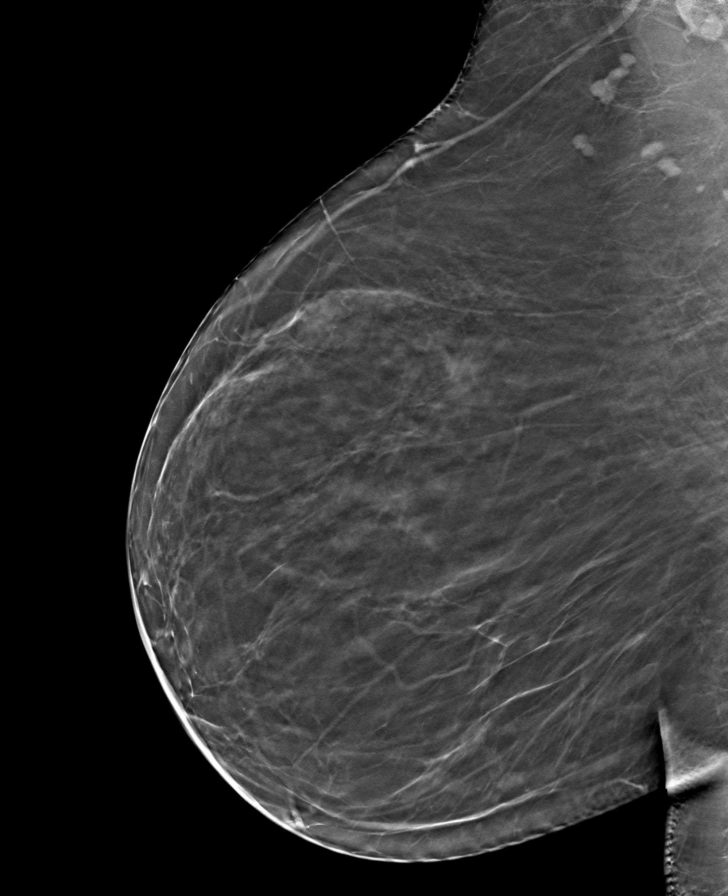

[8 of 24 positions shown; findings below may reference images not displayed]

ACR Breast Density Category b: There are scattered areas of
fibroglandular density.
FINDINGS: No mass, architectural distortion, or suspicious microcalcification
is identified to suggest malignancy in either breast.

Mammographic images were processed with CAD.
IMPRESSION: No evidence of malignancy in either breast.

RECOMMENDATION:
Screening mammogram in one year.(Code:[id number])

I have discussed the findings and recommendations with the patient.
If applicable, a reminder letter will be sent to the patient
regarding the next appointment.

BI-RADS CATEGORY  1: Negative.

## 2020-04-21 ENCOUNTER — Ambulatory Visit (INDEPENDENT_AMBULATORY_CARE_PROVIDER_SITE_OTHER): Payer: Medicare Other | Admitting: Internal Medicine

## 2020-04-22 ENCOUNTER — Encounter (INDEPENDENT_AMBULATORY_CARE_PROVIDER_SITE_OTHER): Payer: Self-pay

## 2020-04-23 ENCOUNTER — Telehealth (INDEPENDENT_AMBULATORY_CARE_PROVIDER_SITE_OTHER): Payer: Medicare Other | Admitting: Internal Medicine

## 2020-04-23 ENCOUNTER — Ambulatory Visit
Admission: EM | Admit: 2020-04-23 | Discharge: 2020-04-23 | Disposition: A | Discharge status: Home / Self Care | Payer: Medicare Other | Attending: Emergency Medicine | Admitting: Emergency Medicine

## 2020-04-23 DIAGNOSIS — J069 Acute upper respiratory infection, unspecified: Secondary | ICD-10-CM

## 2020-04-23 DIAGNOSIS — R6889 Other general symptoms and signs: Secondary | ICD-10-CM | POA: Diagnosis not present

## 2020-04-23 DIAGNOSIS — J029 Acute pharyngitis, unspecified: Secondary | ICD-10-CM

## 2020-04-23 DIAGNOSIS — H9203 Otalgia, bilateral: Secondary | ICD-10-CM

## 2020-04-23 MED ORDER — DEXAMETHASONE SODIUM PHOSPHATE 10 MG/ML IJ SOLN
10.0000 mg | Freq: Once | INTRAMUSCULAR | Status: AC
  Administered 2020-04-23: 10 mg via INTRAMUSCULAR

## 2020-04-23 MED ORDER — PREDNISONE 20 MG PO TABS: 20.0000 mg | ORAL_TABLET | Freq: Two times a day (BID) | ORAL | 0 refills | Status: AC

## 2020-04-23 NOTE — ED Provider Notes (Signed)
[facility]   [id number] 04/23/20 Arrival Time: 1021   CC: COVID symptoms  SUBJECTIVE: History from: patient.  Annette Saunders is a 74 y.o. female who presents with ear pain, pressure, nasal congestion, sneezing, PND, sore throat, hoarse voice, and mild cough x 3 days.  Denies sick exposure to COVID, flu or strep.  Has NOT tried OTC medications.  Symptoms are made worse at night.  Reports previous symptoms in the past with viral URI.   Denies fever, chills, SOB, wheezing, chest pain, nausea, changes in bowel or bladder habits.     ROS: As per HPI.  All other pertinent ROS negative.     Past Medical History:  Diagnosis Date  . Anxiety   . Essential hypertension, benign 06/06/2019  . Generalized osteoarthritis   . GERD (gastroesophageal reflux disease)   . Hyperlipidemia   . Hypothyroidism, adult 06/06/2019  . Insomnia   . Malaise and fatigue 06/06/2019  . Obesity (BMI 30.0-34.9) 06/06/2019   Past Surgical History:  Procedure Laterality Date  . COLONOSCOPY  10/2014   Dr. Cristine Polio, outside hospital. Colonoscopy performed with Propofol, no polyps, small to medium size internal hemorrhoids noted.   [name] ECTOPIC PREGNANCY SURGERY    . ELBOW FRACTURE SURGERY    . HIATAL HERNIA REPAIR    . JOINT REPLACEMENT     bilateral knee  . TONSILLECTOMY     Allergies  Allergen Reactions  . Dilantin [Phenytoin Sodium Extended] Other (See Comments)    Hallucinations if large amounts  . Doxycycline Hyclate     Blurry vision    No current facility-administered medications on file prior to encounter.   Current Outpatient Medications on File Prior to Encounter  Medication Sig Dispense Refill  . Ascorbic Acid (VITAMIN C) 1000 MG tablet Take 1,000 mg by mouth daily.    . Cholecalciferol (VITAMIN D) 50 MCG (2000 UT) tablet Take 2,000 Units by mouth daily.     [name] dicyclomine (BENTYL) 10 MG capsule TAKE 1 CAPSULE(10 MG) BY MOUTH TWICE DAILY AS NEEDED 120 capsule 1  . ELDERBERRY PO Take 1  tablet by mouth daily.    [name] escitalopram (LEXAPRO) 10 MG tablet TAKE 1 TABLET BY MOUTH ONCE DAILY 90 tablet 0  . esomeprazole (NEXIUM) 40 MG capsule Take 1 capsule (40 mg total) by mouth daily. 30 minutes before meal 90 capsule 3  . famotidine (PEPCID) 20 MG tablet Take 1 tablet (20 mg total) by mouth at bedtime. 90 tablet 3  . pantoprazole (PROTONIX) 40 MG tablet TAKE 1 TABLET BY MOUTH TWICE DAILY 30 MINUTES BEFORE A MEAL 60 tablet 5  . progesterone (PROMETRIUM) 200 MG capsule TAKE 3 CAPSULES BY MOUTH EVERY NIGHT 90 capsule 3  . thyroid (NP THYROID) 120 MG tablet Take 1 tablet (120 mg total) by mouth 2 (two) times daily. 60 tablet 3  . zinc gluconate 50 MG tablet Take 50 mg by mouth daily.     Social History   Socioeconomic History  . Marital status: Widowed    Spouse name: Not on file  . Number of children: Not on file  . Years of education: Not on file  . Highest education level: Not on file  Occupational History  . Occupation: retired  Tobacco Use  . Smoking status: Never Smoker  . Smokeless tobacco: Never Used  Vaping Use  . Vaping Use: Never assessed  Substance and Sexual Activity  . Alcohol use: No    Alcohol/week: 0.0 standard drinks  . Drug use: No  .  Sexual activity: Not on file  Other Topics Concern  . Not on file  Social History Narrative   Widow for 26 years-husband died of MI.Lives alone.   Social Determinants of Health   Financial Resource Strain:   . Difficulty of Paying Living Expenses: Not on file  Food Insecurity:   . Worried About Charity fundraiser in the Last Year: Not on file  . Ran Out of Food in the Last Year: Not on file  Transportation Needs:   . Lack of Transportation (Medical): Not on file  . Lack of Transportation (Non-Medical): Not on file  Physical Activity:   . Days of Exercise per Week: Not on file  . Minutes of Exercise per Session: Not on file  Stress:   . Feeling of Stress : Not on file  Social Connections:   . Frequency of  Communication with Friends and Family: Not on file  . Frequency of Social Gatherings with Friends and Family: Not on file  . Attends Religious Services: Not on file  . Active Member of Clubs or Organizations: Not on file  . Attends Archivist Meetings: Not on file  . Marital Status: Not on file  Intimate Partner Violence:   . Fear of Current or Ex-Partner: Not on file  . Emotionally Abused: Not on file  . Physically Abused: Not on file  . Sexually Abused: Not on file   Family History  Problem Relation Age of Onset  . Colon cancer Neg Hx     OBJECTIVE:  Vitals:   04/23/20 1039  BP: 122/78  Pulse: 94  Resp: 20  Temp: 97.9 F (36.6 C)  SpO2: 96%     General appearance: alert; appears mildly fatigued, but nontoxic; speaking in full sentences and tolerating own secretions; voice hoarse HEENT: NCAT; Ears: EACs clear, TMs pearly gray; Eyes: PERRL.  EOM grossly intact. Nose: nares patent without rhinorrhea, Throat: oropharynx clear, tonsils non erythematous or enlarged, uvula midline  Neck: supple without LAD Lungs: unlabored respirations, symmetrical air entry; cough: mild; no respiratory distress; CTAB Heart: regular rate and rhythm.   Skin: warm and dry Psychological: alert and cooperative; normal mood and affect   ASSESSMENT & PLAN:  1. Viral URI with cough   2. Sore throat   3. Ear pain, bilateral     Meds ordered this encounter  Medications  . predniSONE (DELTASONE) 20 MG tablet    Sig: Take 1 tablet (20 mg total) by mouth 2 (two) times daily with a meal for 5 days.    Dispense:  10 tablet    Refill:  0    Order Specific Question:   Supervising Provider    Answer:   Raylene Everts [[id number]]  . dexamethasone (DECADRON) injection 10 mg   COVID testing ordered.  It will take between 2-5 days for test results.  Someone will contact you regarding abnormal results.    In the meantime: You should remain isolated in your home for 10 days from symptom  onset AND greater than 72 hours after symptoms resolution (absence of fever without the use of fever-reducing medication and improvement in respiratory symptoms), whichever is longer Get plenty of rest and push fluids Steroid shot given Prednisone prescribed.  Take as directed and to completion Use OTC zyrtec for nasal congestion, runny nose, and/or sore throat Use OTC flonase for nasal congestion and runny nose Use medications daily for symptom relief Use OTC medications like ibuprofen or tylenol as needed fever or pain  Call or go to the ED if you have any new or worsening symptoms such as fever, worsening cough, shortness of breath, chest tightness, chest pain, turning blue, changes in mental status, etc...   Reviewed expectations re: course of current medical issues. Questions answered. Outlined signs and symptoms indicating need for more acute intervention. Patient verbalized understanding. After Visit Summary given.         Lestine Box, PA-C 04/23/20 1058

## 2020-04-23 NOTE — Discharge Instructions (Signed)
COVID testing ordered.  It will take between 2-5 days for test results.  Someone will contact you regarding abnormal results.    In the meantime: You should remain isolated in your home for 10 days from symptom onset AND greater than 72 hours after symptoms resolution (absence of fever without the use of fever-reducing medication and improvement in respiratory symptoms), whichever is longer Get plenty of rest and push fluids Steroid shot given Prednisone prescribed.  Take as directed and to completion Use OTC zyrtec for nasal congestion, runny nose, and/or sore throat Use OTC flonase for nasal congestion and runny nose Use medications daily for symptom relief Use OTC medications like ibuprofen or tylenol as needed fever or pain Call or go to the ED if you have any new or worsening symptoms such as fever, worsening cough, shortness of breath, chest tightness, chest pain, turning blue, changes in mental status, etc..[name]

## 2020-04-23 NOTE — ED Triage Notes (Signed)
Pt presents with ear and throat pain, also has cough

## 2020-04-24 ENCOUNTER — Other Ambulatory Visit (INDEPENDENT_AMBULATORY_CARE_PROVIDER_SITE_OTHER): Payer: Self-pay | Admitting: Internal Medicine

## 2020-04-24 LAB — NOVEL CORONAVIRUS, NAA: SARS-CoV-2, NAA: NOT DETECTED

## 2020-04-24 LAB — SARS-COV-2, NAA 2 DAY TAT

## 2020-04-26 ENCOUNTER — Other Ambulatory Visit (INDEPENDENT_AMBULATORY_CARE_PROVIDER_SITE_OTHER): Payer: Self-pay | Admitting: Internal Medicine

## 2020-04-30 ENCOUNTER — Telehealth: Payer: Self-pay | Admitting: Internal Medicine

## 2020-04-30 NOTE — Telephone Encounter (Signed)
Informed endo scheduler to cancel procedure.   FYI to Roseanne Kaufman NP.

## 2020-04-30 NOTE — Telephone Encounter (Signed)
Pt wants to cancel her procedure on 05/25/2020 with Dr Abbey Chatters and will reschedule after the holidays.

## 2020-05-01 ENCOUNTER — Other Ambulatory Visit: Payer: Self-pay | Admitting: Gastroenterology

## 2020-05-07 ENCOUNTER — Telehealth: Payer: Self-pay | Admitting: Internal Medicine

## 2020-05-07 DIAGNOSIS — K591 Functional diarrhea: Secondary | ICD-10-CM

## 2020-05-07 NOTE — Telephone Encounter (Signed)
Tried to call pt- NA-LMOM to return call.

## 2020-05-07 NOTE — Telephone Encounter (Signed)
Pt called to say that she is having a CT done and wanted to know if she could see Roseanne Kaufman, NP the following Monday. I told her that Vicente Males rounds on Mondays and her next available in the office will be the end of October. She then tells me that she has lost bowel control in her car and all she's eaten today was a scrambled egg around 11am. She is asking if we can prescribed something to help her. She uses [facility] on [address]. Please advise. [phone]

## 2020-05-08 ENCOUNTER — Telehealth: Payer: Self-pay | Admitting: Internal Medicine

## 2020-05-08 NOTE — Telephone Encounter (Signed)
See other phone note, Angie spoke with the pt.

## 2020-05-08 NOTE — Telephone Encounter (Signed)
Tried to call pt- NA 

## 2020-05-08 NOTE — Telephone Encounter (Signed)
PATIENT RETURNED CALL  

## 2020-05-08 NOTE — Telephone Encounter (Signed)
Spoke with pt.  She said that she was having diarrhea in the past and it stopped for awhile and has come back now.  She says anything she eats goes straight through her.  She is having watery yellow stools 2 to 3 times daily for past 3 days.  She took Imodium this morning so she feels ok for the moment.  She said that yesterday it was so bad that she lost control of her bowels in her car.  She said water makes her stomach feel bubbly.  Routing to Walden Field, NP in absence of Roseanne Kaufman, NP.

## 2020-05-08 NOTE — Telephone Encounter (Signed)
Lmom for pt to call me back. 

## 2020-05-08 NOTE — Telephone Encounter (Signed)
Patient returned call after closing 05/07/20, please call back

## 2020-05-08 NOTE — Telephone Encounter (Signed)
Tried to call pt back.  LMOM

## 2020-05-11 ENCOUNTER — Encounter (HOSPITAL_COMMUNITY): Payer: Medicare Other

## 2020-05-14 ENCOUNTER — Ambulatory Visit (INDEPENDENT_AMBULATORY_CARE_PROVIDER_SITE_OTHER): Payer: Medicare Other | Admitting: Nurse Practitioner

## 2020-05-14 NOTE — Telephone Encounter (Signed)
Spoke to pt and she is having diarrhea in the mornings.  She is scheduled to have CT scan tomorrow.  She is continuing to have side pain.  She said that she is not taking Bentyl.  She said that she may end up needing something prescribed for abd pain if no results after CT scan tomorrow.  She said that she would like to go have labs drawn on Monday after her doctor's appointment.

## 2020-05-14 NOTE — Telephone Encounter (Signed)
I've looked back through the past few OV and don't see any recent treatment for diarrhea. She is on Bentyl for abdominal pain.  Is she still taking bentyl?  Give new (ish) onset diarrhea, I'd like to check stool studies to make sure she hasn't picked up something. If these are normal we can further address. She may need a sooner f/u office visit with Vicente Males if she persists with diarrhea.

## 2020-05-14 NOTE — Addendum Note (Signed)
Addended by: Gordy Levan, Areej Tayler A on: 05/14/2020 03:00 PM   Modules accepted: Orders

## 2020-05-15 ENCOUNTER — Other Ambulatory Visit: Payer: Self-pay

## 2020-05-15 ENCOUNTER — Ambulatory Visit (HOSPITAL_COMMUNITY)
Admission: RE | Admit: 2020-05-15 | Discharge: 2020-05-15 | Disposition: A | Discharge status: Home / Self Care | Payer: Medicare Other | Source: Ambulatory Visit | Attending: Gastroenterology | Admitting: Gastroenterology

## 2020-05-15 DIAGNOSIS — R1030 Lower abdominal pain, unspecified: Secondary | ICD-10-CM | POA: Insufficient documentation

## 2020-05-15 DIAGNOSIS — R109 Unspecified abdominal pain: Secondary | ICD-10-CM | POA: Diagnosis not present

## 2020-05-15 LAB — POCT I-STAT CREATININE: Creatinine, Ser: 0.9 mg/dL (ref 0.44–1.00)

## 2020-05-15 MED ORDER — IOHEXOL 300 MG/ML  SOLN
100.0000 mL | Freq: Once | INTRAMUSCULAR | Status: AC | PRN
  Administered 2020-05-15: 100 mL via INTRAVENOUS

## 2020-05-15 NOTE — Telephone Encounter (Signed)
Will await CT findings. Needs stool studies as well.

## 2020-05-15 NOTE — Telephone Encounter (Signed)
Noted  

## 2020-05-19 ENCOUNTER — Telehealth: Payer: Self-pay | Admitting: Internal Medicine

## 2020-05-19 NOTE — Telephone Encounter (Signed)
Pt returning call. [phone]

## 2020-05-19 NOTE — Telephone Encounter (Signed)
Spoke with pt and she is aware of Roseanne Kaufman, NP's recommendations.

## 2020-05-19 NOTE — Progress Notes (Signed)
Spoke to pt.  She is currently taking Bentyl.  She was advised to take this up to 4 times per day if needed.  Pt voiced understanding.

## 2020-05-22 ENCOUNTER — Other Ambulatory Visit (HOSPITAL_COMMUNITY): Payer: Medicare Other

## 2020-05-25 ENCOUNTER — Ambulatory Visit (HOSPITAL_COMMUNITY): Admit: 2020-05-25 | Payer: Medicare Other

## 2020-05-25 ENCOUNTER — Encounter (HOSPITAL_COMMUNITY): Payer: Self-pay

## 2020-05-25 SURGERY — ESOPHAGOGASTRODUODENOSCOPY (EGD) WITH PROPOFOL
Anesthesia: Monitor Anesthesia Care

## 2020-05-28 ENCOUNTER — Ambulatory Visit (INDEPENDENT_AMBULATORY_CARE_PROVIDER_SITE_OTHER): Payer: Medicare Other | Admitting: Nurse Practitioner

## 2020-06-03 ENCOUNTER — Ambulatory Visit: Payer: Medicare Other | Admitting: Orthopaedic Surgery

## 2020-06-03 ENCOUNTER — Telehealth: Payer: Self-pay | Admitting: Orthopaedic Surgery

## 2020-06-03 NOTE — Telephone Encounter (Signed)
A painful total joint is a big concern.  I could see her but she will need to be seen by doctor who does surgery.  I can have her seen at [facility] or [facility] if she wants.  She will have to go there eventually.

## 2020-06-03 NOTE — Telephone Encounter (Signed)
Noted. Called patient to offer appointment with Dr Luna Glasgow tomorrow - she is checking her schedule.

## 2020-06-03 NOTE — Telephone Encounter (Signed)
Patient called to relay recurrence of right knee pain. States Dr Luna Glasgow is aware that she had total knee replacement (on both knees) in approximately 2008. States she had previously been referred to [facility] for the right knee, for what she said has to do with knee replacement"slipping" - states she did not like the provider she had seen there.  Patient said she wants to be seen in [place] rather than [place] or [place], and knows that Dr Luna Glasgow no longer does surgery.  Please advise.

## 2020-06-05 NOTE — Telephone Encounter (Signed)
Patient called back; scheduled appointment.

## 2020-06-05 NOTE — Telephone Encounter (Signed)
No further response from patient. Called back to patient, again offered appointment - reached voice mail, left message.

## 2020-06-09 ENCOUNTER — Other Ambulatory Visit: Payer: Self-pay

## 2020-06-09 ENCOUNTER — Ambulatory Visit (INDEPENDENT_AMBULATORY_CARE_PROVIDER_SITE_OTHER): Payer: Medicare Other | Admitting: Nurse Practitioner

## 2020-06-09 ENCOUNTER — Encounter (INDEPENDENT_AMBULATORY_CARE_PROVIDER_SITE_OTHER): Payer: Self-pay | Admitting: Nurse Practitioner

## 2020-06-09 VITALS — BP 124/84 | HR 60 | Temp 97.7°F | Ht 61.0 in | Wt 192.8 lb

## 2020-06-09 DIAGNOSIS — Z23 Encounter for immunization: Secondary | ICD-10-CM | POA: Diagnosis not present

## 2020-06-09 DIAGNOSIS — I10 Essential (primary) hypertension: Secondary | ICD-10-CM | POA: Diagnosis not present

## 2020-06-09 DIAGNOSIS — R5383 Other fatigue: Secondary | ICD-10-CM

## 2020-06-09 DIAGNOSIS — E559 Vitamin D deficiency, unspecified: Secondary | ICD-10-CM | POA: Diagnosis not present

## 2020-06-09 DIAGNOSIS — E039 Hypothyroidism, unspecified: Secondary | ICD-10-CM

## 2020-06-09 NOTE — Progress Notes (Signed)
Subjective:  Patient ID: Annette Saunders, female    DOB: 11-Jul-1946  Age: 74 y.o. MRN: [id number]  CC:  Chief Complaint  Patient presents with  . Follow-up  . Fatigue  . Hypothyroidism  . Hypertension  . Other    Vitamin D deficiency      HPI  This patient arrives today for the above.  Fatigue/Hypothyroidism: She continues to have fatigue.  She is on NP thyroid for treatment of her hypothyroidism.  She is due for blood work today.  Upon further questioning today she does tell me that she does not feel rested when she first wakes up, she has never been told that she snores or has apneic episodes while sleeping.  She is never undergone sleep study.  She has undergone cardiac echocardiogram within the last year and it was essentially normal.  Last CBC collected earlier this year did not show any anemia.  Hypertension:  She is no longer on antihypertensive to control her blood pressure.  She is diet and lifestyle controlled currently.   Vitamin D deficiency: She continues on her vitamin D3 supplement is due to have serum level checked today.  Past Medical History:  Diagnosis Date  . Anxiety   . Essential hypertension, benign 06/06/2019  . Generalized osteoarthritis   . GERD (gastroesophageal reflux disease)   . Hyperlipidemia   . Hypothyroidism, adult 06/06/2019  . Insomnia   . Malaise and fatigue 06/06/2019  . Obesity (BMI 30.0-34.9) 06/06/2019      Family History  Problem Relation Age of Onset  . Colon cancer Neg Hx     Social History   Social History Narrative   Widow for 36 years-husband died of MI.Lives alone.   Social History   Tobacco Use  . Smoking status: Never Smoker  . Smokeless tobacco: Never Used  Substance Use Topics  . Alcohol use: No    Alcohol/week: 0.0 standard drinks     Current Meds  Medication Sig  . Ascorbic Acid (VITAMIN C) 1000 MG tablet Take 1,000 mg by mouth daily.  [name] augmented betamethasone dipropionate (DIPROLENE-AF) 0.05 % cream  Apply topically.  . Cholecalciferol (VITAMIN D) 50 MCG (2000 UT) tablet Take 2,000 Units by mouth daily.   . clindamycin (CLEOCIN T) 1 % lotion Apply topically 2 (two) times daily as needed.  . dicyclomine (BENTYL) 10 MG capsule TAKE 1 CAPSULE(10 MG) BY MOUTH TWICE DAILY AS NEEDED  . ELDERBERRY PO Take 1 tablet by mouth daily.  [name] escitalopram (LEXAPRO) 10 MG tablet TAKE 1 TABLET BY MOUTH ONCE DAILY  . esomeprazole (NEXIUM) 40 MG capsule Take 1 capsule (40 mg total) by mouth daily. 30 minutes before meal  . famotidine (PEPCID) 20 MG tablet Take 1 tablet (20 mg total) by mouth at bedtime.  . NP THYROID 120 MG tablet TAKE 1 TABLET(120 MG) BY MOUTH TWICE DAILY  . pantoprazole (PROTONIX) 40 MG tablet TAKE 1 TABLET BY MOUTH TWICE DAILY 30 MINUTES BEFORE A MEAL  . progesterone (PROMETRIUM) 200 MG capsule TAKE 3 CAPSULES BY MOUTH EVERY NIGHT  . zinc gluconate 50 MG tablet Take 50 mg by mouth daily.    ROS:  Review of Systems  Constitutional: Positive for malaise/fatigue. Negative for fever.  Respiratory: Negative.   Cardiovascular: Negative.   Neurological: Negative.      Objective:   Today's Vitals: BP 124/84   Pulse 60   Temp 97.7 F (36.5 C) (Temporal)   Ht $R'5\' 1"'Eb$  (1.549 m)   Wt  192 lb 12.8 oz (87.5 kg)   SpO2 93%   BMI 36.43 kg/m  Vitals with BMI 06/09/2020 04/23/2020 03/24/2020  Height $Remov'5\' 1"'QJkGLW$  - $'5\' 1"'d$   Weight 192 lbs 13 oz - 198 lbs 3 oz  BMI 34.19 - 37.90  Systolic 240 973 532  Diastolic 84 78 85  Pulse 60 94 105     Physical Exam Vitals reviewed.  Constitutional:      General: She is not in acute distress.    Appearance: Normal appearance.  HENT:     Head: Normocephalic and atraumatic.  Neck:     Vascular: No carotid bruit.  Cardiovascular:     Rate and Rhythm: Normal rate and regular rhythm.     Pulses: Normal pulses.     Heart sounds: Normal heart sounds.  Pulmonary:     Effort: Pulmonary effort is normal.     Breath sounds: Normal breath sounds.  Skin:     General: Skin is warm and dry.  Neurological:     General: No focal deficit present.     Mental Status: She is alert and oriented to person, place, and time.  Psychiatric:        Mood and Affect: Mood normal.        Behavior: Behavior normal.        Judgment: Judgment normal.          Assessment and Plan   1. Hypothyroidism, adult   2. Vitamin D deficiency   3. Fatigue, unspecified type   4. Needs flu shot   5. Essential hypertension, benign      Plan: 1.,  3.  We will collect blood for for further evaluation today for now she will continue on her NP thyroid.  Also discussed possibly sending her for sleep study test to rule out sleep apnea.  She would like to hold off on this for now.  I did discuss that sleep apnea can be associated with significant complications if left untreated, but she would like to hold off for now and let me know when she is ready to have this study completed. 2.  We will check vitamin D serum level today for further evaluation. 4.  We will administer high-dose flu shot today. 5.  Blood pressure comfortable today we will not restart on any antihypertensives at this time.   Tests ordered Orders Placed This Encounter  Procedures  . Flu vaccine HIGH DOSE PF (Fluzone High dose)  . TSH  . T3, Free  . T4, Free  . Vitamin D, 25-hydroxy  . CMP with eGFR([facility])  . CBC with Differential/Platelets      No orders of the defined types were placed in this encounter.   Patient to follow-up in 3 months or sooner as needed  Ailene Ards, NP

## 2020-06-10 LAB — CBC WITH DIFFERENTIAL/PLATELET
Absolute Monocytes: 608 cells/uL (ref 200–950)
Basophils Absolute: 38 cells/uL (ref 0–200)
Basophils Relative: 0.5 %
Eosinophils Absolute: 220 cells/uL (ref 15–500)
Eosinophils Relative: 2.9 %
HCT: 41.4 % (ref 35.0–45.0)
Hemoglobin: 13.4 g/dL (ref 11.7–15.5)
Lymphs Abs: 3367 cells/uL (ref 850–3900)
MCH: 26.9 pg — ABNORMAL LOW (ref 27.0–33.0)
MCHC: 32.4 g/dL (ref 32.0–36.0)
MCV: 83.1 fL (ref 80.0–100.0)
MPV: 10.1 fL (ref 7.5–12.5)
Monocytes Relative: 8 %
Neutro Abs: 3367 cells/uL (ref 1500–7800)
Neutrophils Relative %: 44.3 %
Platelets: 393 10*3/uL (ref 140–400)
RBC: 4.98 10*6/uL (ref 3.80–5.10)
RDW: 12.8 % (ref 11.0–15.0)
Total Lymphocyte: 44.3 %
WBC: 7.6 10*3/uL (ref 3.8–10.8)

## 2020-06-10 LAB — COMPLETE METABOLIC PANEL WITH GFR
AG Ratio: 1.1 (calc) (ref 1.0–2.5)
ALT: 19 U/L (ref 6–29)
AST: 22 U/L (ref 10–35)
Albumin: 4.2 g/dL (ref 3.6–5.1)
Alkaline phosphatase (APISO): 107 U/L (ref 37–153)
BUN: 24 mg/dL (ref 7–25)
CO2: 22 mmol/L (ref 20–32)
Calcium: 10.8 mg/dL — ABNORMAL HIGH (ref 8.6–10.4)
Chloride: 108 mmol/L (ref 98–110)
Creat: 0.87 mg/dL (ref 0.60–0.93)
GFR, Est African American: 77 mL/min/{1.73_m2} (ref >=60)
GFR, Est Non African American: 66 mL/min/{1.73_m2} (ref >=60)
Globulin: 3.7 g/dL (calc) (ref 1.9–3.7)
Glucose, Bld: 115 mg/dL — ABNORMAL HIGH (ref 65–99)
Potassium: 4.3 mmol/L (ref 3.5–5.3)
Sodium: 143 mmol/L (ref 135–146)
Total Bilirubin: 0.4 mg/dL (ref 0.2–1.2)
Total Protein: 7.9 g/dL (ref 6.1–8.1)

## 2020-06-10 LAB — T3, FREE: T3, Free: 5.8 pg/mL — ABNORMAL HIGH (ref 2.3–4.2)

## 2020-06-10 LAB — VITAMIN D 25 HYDROXY (VIT D DEFICIENCY, FRACTURES): Vit D, 25-Hydroxy: 51 ng/mL (ref 30–100)

## 2020-06-10 LAB — TSH: TSH: <0.01 mIU/L — ABNORMAL LOW (ref 0.40–4.50)

## 2020-06-10 LAB — T4, FREE: Free T4: 1.6 ng/dL (ref 0.8–1.8)

## 2020-06-11 ENCOUNTER — Ambulatory Visit: Payer: Medicare Other

## 2020-06-11 ENCOUNTER — Ambulatory Visit (INDEPENDENT_AMBULATORY_CARE_PROVIDER_SITE_OTHER): Payer: Medicare Other | Admitting: Orthopaedic Surgery

## 2020-06-11 ENCOUNTER — Other Ambulatory Visit: Payer: Self-pay

## 2020-06-11 ENCOUNTER — Encounter: Payer: Self-pay | Admitting: Orthopaedic Surgery

## 2020-06-11 VITALS — BP 138/100 | HR 87 | Ht 61.0 in | Wt 192.0 lb

## 2020-06-11 DIAGNOSIS — M25561 Pain in right knee: Secondary | ICD-10-CM

## 2020-06-11 DIAGNOSIS — Z96651 Presence of right artificial knee joint: Secondary | ICD-10-CM

## 2020-06-11 DIAGNOSIS — G8929 Other chronic pain: Secondary | ICD-10-CM

## 2020-06-11 NOTE — Progress Notes (Signed)
Patient Annette Saunders, female DOB:09/05/1945, 74 y.o. [id number]  Chief Complaint  Patient presents with  . Knee Pain    right knee pain, s/p TKA, fell multiple times several years ago    HPI  Annette Saunders is a 74 y.o. female who has a painful total knee on the right.  She had the knee done in [place] many years ago and has done well until the last few weeks.  She has pain in the evenings after climbing steps.  She has no redness, no trauma, no swelling.  Some days she does not have any pain.  She says Tylenol helps but she is concerned about the pain.   Body mass index is 36.28 kg/m.  ROS  Review of Systems  Constitutional: Positive for activity change.       Patient does not have diabetes Patient has hypertension Patient does not have COPD Patient does not smoke.  HENT: Negative for congestion.   Respiratory: Negative for cough and shortness of breath.   Cardiovascular: Negative for chest pain.  Musculoskeletal: Positive for back pain, gait problem, myalgias and neck pain.  Allergic/Immunologic: Negative for environmental allergies.  Psychiatric/Behavioral: The patient is nervous/anxious.   All other systems reviewed and are negative.   All other systems reviewed and are negative.  The following is a summary of the past history medically, past history surgically, known current medicines, social history and family history.  This information is gathered electronically by the computer from prior information and documentation.  I review this each visit and have found including this information at this point in the chart is beneficial and informative.    Past Medical History:  Diagnosis Date  . Anxiety   . Essential hypertension, benign 06/06/2019  . Generalized osteoarthritis   . GERD (gastroesophageal reflux disease)   . Hyperlipidemia   . Hypothyroidism, adult 06/06/2019  . Insomnia   . Malaise and fatigue 06/06/2019  . Obesity (BMI 30.0-34.9) 06/06/2019     Past Surgical History:  Procedure Laterality Date  . COLONOSCOPY  10/2014   Dr. Cristine Polio, outside hospital. Colonoscopy performed with Propofol, no polyps, small to medium size internal hemorrhoids noted.   [name] ECTOPIC PREGNANCY SURGERY    . ELBOW FRACTURE SURGERY    . HIATAL HERNIA REPAIR    . JOINT REPLACEMENT     bilateral knee  . TONSILLECTOMY      Family History  Problem Relation Age of Onset  . Colon cancer Neg Hx     Social History Social History   Tobacco Use  . Smoking status: Never Smoker  . Smokeless tobacco: Never Used  Vaping Use  . Vaping Use: Never assessed  Substance Use Topics  . Alcohol use: No    Alcohol/week: 0.0 standard drinks  . Drug use: No    Allergies  Allergen Reactions  . Dilantin [Phenytoin Sodium Extended] Other (See Comments)    Hallucinations if large amounts  . Doxycycline Hyclate     Blurry vision     Current Outpatient Medications  Medication Sig Dispense Refill  . Ascorbic Acid (VITAMIN C) 1000 MG tablet Take 1,000 mg by mouth daily.    [name] augmented betamethasone dipropionate (DIPROLENE-AF) 0.05 % cream Apply topically.    . Cholecalciferol (VITAMIN D) 50 MCG (2000 UT) tablet Take 2,000 Units by mouth daily.     . clindamycin (CLEOCIN T) 1 % lotion Apply topically 2 (two) times daily as needed.    . dicyclomine (BENTYL) 10 MG capsule TAKE 1 CAPSULE(10  MG) BY MOUTH TWICE DAILY AS NEEDED 120 capsule 1  . ELDERBERRY PO Take 1 tablet by mouth daily.    [name] escitalopram (LEXAPRO) 10 MG tablet TAKE 1 TABLET BY MOUTH ONCE DAILY 90 tablet 0  . esomeprazole (NEXIUM) 40 MG capsule Take 1 capsule (40 mg total) by mouth daily. 30 minutes before meal 90 capsule 3  . famotidine (PEPCID) 20 MG tablet Take 1 tablet (20 mg total) by mouth at bedtime. 90 tablet 3  . NP THYROID 120 MG tablet TAKE 1 TABLET(120 MG) BY MOUTH TWICE DAILY 60 tablet 3  . pantoprazole (PROTONIX) 40 MG tablet TAKE 1 TABLET BY MOUTH TWICE DAILY 30 MINUTES BEFORE A MEAL 60  tablet 5  . progesterone (PROMETRIUM) 200 MG capsule TAKE 3 CAPSULES BY MOUTH EVERY NIGHT 90 capsule 3  . zinc gluconate 50 MG tablet Take 50 mg by mouth daily.     No current facility-administered medications for this visit.     Physical Exam  Blood pressure (!) 138/100, pulse 87, height 5\' 1"  (1.549 m), weight 192 lb (87.1 kg).  Constitutional: overall normal hygiene, normal nutrition, well developed, normal grooming, normal body habitus. Assistive device:none  Musculoskeletal: gait and station Limp none, muscle tone and strength are normal, no tremors or atrophy is present.  .  Neurological: coordination overall normal.  Deep tendon reflex/nerve stretch intact.  Sensation normal.  Cranial nerves II-XII intact.   Skin:   Normal overall no scars, lesions, ulcers or rashes. No psoriasis.  Psychiatric: Alert and oriented x 3.  Recent memory intact, remote memory unclear.  Normal mood and affect. Well groomed.  Good eye contact.  Cardiovascular: overall no swelling, no varicosities, no edema bilaterally, normal temperatures of the legs and arms, no clubbing, cyanosis and good capillary refill.  Lymphatic: palpation is normal.  Right knee has well healed scar, full ROM, no crepitus, no effusion, no redness, stable, NV intact.  Gait is good.  All other systems reviewed and are negative   The patient has been educated about the nature of the problem(s) and counseled on treatment options.  The patient appeared to understand what I have discussed and is in agreement with it.  Encounter Diagnosis  Name Primary?  . Chronic knee pain after total replacement of right knee joint Yes   X-rays were done of the right knee, reported separately.  PLAN Call if any problems.  Precautions discussed.  Continue current medications.   Return to clinic 2 weeks   She had labs done yesterday by her family doctor and CBC was normal.  Electrolytes were normal.  I will have her get sed rate and  c-reactive protein.  Return in two weeks.  Electronically Signed Sanjuana Kava, MD 10/14/202111:29 AM

## 2020-06-17 ENCOUNTER — Ambulatory Visit: Payer: Medicare Other | Admitting: Orthopaedic Surgery

## 2020-06-18 ENCOUNTER — Telehealth: Payer: Self-pay | Admitting: Internal Medicine

## 2020-06-18 NOTE — Telephone Encounter (Signed)
Spoke to pt.  She says she is doing good on Dicyclomine.  She was taking 4 a day but is now taking 3 a day.  She isn't having any side pain.  Wants to know if we can send in another RX for Dicyclomine.  [facility] [address]

## 2020-06-18 NOTE — Telephone Encounter (Signed)
Pt has a question about her medication. She said Roseanne Kaufman, NP had increased it and she was running low and will need a refill if she was suppose to continue taking it. Dicyclomine ?  She uses [facility] on [address]. Please advise. [phone]

## 2020-06-19 MED ORDER — DICYCLOMINE HCL 10 MG PO CAPS: 10.0000 mg | ORAL_CAPSULE | Freq: Three times a day (TID) | ORAL | 1 refills | Status: DC

## 2020-06-19 NOTE — Addendum Note (Signed)
Addended by: Annitta Needs on: 06/19/2020 12:20 PM   Modules accepted: Orders

## 2020-06-19 NOTE — Telephone Encounter (Signed)
Completed.

## 2020-06-22 ENCOUNTER — Other Ambulatory Visit (INDEPENDENT_AMBULATORY_CARE_PROVIDER_SITE_OTHER): Payer: Self-pay | Admitting: Internal Medicine

## 2020-06-22 DIAGNOSIS — F419 Anxiety disorder, unspecified: Secondary | ICD-10-CM

## 2020-06-23 DIAGNOSIS — M25561 Pain in right knee: Secondary | ICD-10-CM | POA: Diagnosis not present

## 2020-06-23 DIAGNOSIS — G8929 Other chronic pain: Secondary | ICD-10-CM | POA: Diagnosis not present

## 2020-06-23 DIAGNOSIS — Z96651 Presence of right artificial knee joint: Secondary | ICD-10-CM | POA: Diagnosis not present

## 2020-06-24 ENCOUNTER — Telehealth: Payer: Self-pay | Admitting: Orthopaedic Surgery

## 2020-06-24 LAB — C-REACTIVE PROTEIN: CRP: 4.9 mg/L (ref <8.0)

## 2020-06-24 LAB — SEDIMENTATION RATE: Sed Rate: 56 mm/h — ABNORMAL HIGH (ref 0–30)

## 2020-06-24 NOTE — Telephone Encounter (Signed)
Called back to patient; elects in-person visit tomorrow. Done

## 2020-06-24 NOTE — Telephone Encounter (Signed)
Patient called to relay she has read her lab results via [place].  Patient's originally scheduled appointment is Tuesday, 06/30/20, and she is asking if she may have her appointment tomorrow, 06/25/20 instead. May patient have a virtual/telephone visit today or tomorrow?  Please advise.

## 2020-06-24 NOTE — Telephone Encounter (Signed)
She can have visit tomorrow, virtual or in person.

## 2020-06-25 ENCOUNTER — Other Ambulatory Visit: Payer: Self-pay

## 2020-06-25 ENCOUNTER — Encounter: Payer: Self-pay | Admitting: Orthopaedic Surgery

## 2020-06-25 ENCOUNTER — Ambulatory Visit (INDEPENDENT_AMBULATORY_CARE_PROVIDER_SITE_OTHER): Payer: Medicare Other | Admitting: Orthopaedic Surgery

## 2020-06-25 DIAGNOSIS — G8929 Other chronic pain: Secondary | ICD-10-CM

## 2020-06-25 DIAGNOSIS — Z96651 Presence of right artificial knee joint: Secondary | ICD-10-CM | POA: Diagnosis not present

## 2020-06-25 DIAGNOSIS — M25561 Pain in right knee: Secondary | ICD-10-CM | POA: Diagnosis not present

## 2020-06-25 NOTE — Progress Notes (Signed)
Patient Annette Saunders, female DOB:Aug 20, 1946, 74 y.o. [id number]  Chief Complaint  Patient presents with   Knee Pain    go over labs today    HPI  Nature Annette Saunders is a 74 y.o. female who has pain of right knee post total knee replacement.  She had labs done and the sed rate was elevated to 56, other labs were normal.  She was excited with the sed rate and called to see me to explain what this meant.  I have told her that the sed rate is elevated but she has underlying arthritis in other joints and that it is nonspecific by itself alone, but with the other labs I do not feel her total knee is infected.  She wants to see Dr. Maureen Ralphs for further evaluation.  He has asked her to get her records. She had the total knee in [place].  She has called them but has not received the records yet.  I told her to call them again and after she has the records physically in hand, then we can call and get her seen. She is agreeable to this.   There is no height or weight on file to calculate BMI.  ROS  Review of Systems  Constitutional: Positive for activity change.       Patient does not have diabetes Patient has hypertension Patient does not have COPD Patient does not smoke.  HENT: Negative for congestion.   Respiratory: Negative for cough and shortness of breath.   Cardiovascular: Negative for chest pain.  Musculoskeletal: Positive for back pain, gait problem, myalgias and neck pain.  Allergic/Immunologic: Negative for environmental allergies.  Psychiatric/Behavioral: The patient is nervous/anxious.   All other systems reviewed and are negative.   All other systems reviewed and are negative.  The following is a summary of the past history medically, past history surgically, known current medicines, social history and family history.  This information is gathered electronically by the computer from prior information and documentation.  I review this each visit and have found including  this information at this point in the chart is beneficial and informative.    Past Medical History:  Diagnosis Date   Anxiety    Essential hypertension, benign 06/06/2019   Generalized osteoarthritis    GERD (gastroesophageal reflux disease)    Hyperlipidemia    Hypothyroidism, adult 06/06/2019   Insomnia    Malaise and fatigue 06/06/2019   Obesity (BMI 30.0-34.9) 06/06/2019    Past Surgical History:  Procedure Laterality Date   COLONOSCOPY  10/2014   Dr. Cristine Polio, outside hospital. Colonoscopy performed with Propofol, no polyps, small to medium size internal hemorrhoids noted.    ECTOPIC PREGNANCY SURGERY     ELBOW FRACTURE SURGERY     HIATAL HERNIA REPAIR     JOINT REPLACEMENT     bilateral knee   TONSILLECTOMY      Family History  Problem Relation Age of Onset   Colon cancer Neg Hx     Social History Social History   Tobacco Use   Smoking status: Never Smoker   Smokeless tobacco: Never Used  Scientific laboratory technician Use: Never assessed  Substance Use Topics   Alcohol use: No    Alcohol/week: 0.0 standard drinks   Drug use: No    Allergies  Allergen Reactions   Dilantin [Phenytoin Sodium Extended] Other (See Comments)    Hallucinations if large amounts   Doxycycline Hyclate     Blurry vision     Current Outpatient  Medications  Medication Sig Dispense Refill   Ascorbic Acid (VITAMIN C) 1000 MG tablet Take 1,000 mg by mouth daily.     augmented betamethasone dipropionate (DIPROLENE-AF) 0.05 % cream Apply topically.     Cholecalciferol (VITAMIN D) 50 MCG (2000 UT) tablet Take 2,000 Units by mouth daily.      clindamycin (CLEOCIN T) 1 % lotion Apply topically 2 (two) times daily as needed.     dicyclomine (BENTYL) 10 MG capsule Take 1 capsule (10 mg total) by mouth 4 (four) times daily -  before meals and at bedtime. For cramping as needed 120 capsule 1   ELDERBERRY PO Take 1 tablet by mouth daily.     escitalopram (LEXAPRO) 10 MG tablet  TAKE 1 TABLET BY MOUTH ONCE DAILY 90 tablet 0   esomeprazole (NEXIUM) 40 MG capsule Take 1 capsule (40 mg total) by mouth daily. 30 minutes before meal 90 capsule 3   famotidine (PEPCID) 20 MG tablet Take 1 tablet (20 mg total) by mouth at bedtime. 90 tablet 3   NP THYROID 120 MG tablet TAKE 1 TABLET(120 MG) BY MOUTH TWICE DAILY 60 tablet 3   pantoprazole (PROTONIX) 40 MG tablet TAKE 1 TABLET BY MOUTH TWICE DAILY 30 MINUTES BEFORE A MEAL 60 tablet 5   progesterone (PROMETRIUM) 200 MG capsule TAKE 3 CAPSULES BY MOUTH EVERY NIGHT 90 capsule 3   zinc gluconate 50 MG tablet Take 50 mg by mouth daily.     No current facility-administered medications for this visit.     Physical Exam  There were no vitals taken for this visit.  Constitutional: overall normal hygiene, normal nutrition, well developed, normal grooming, normal body habitus. Assistive device:none  Musculoskeletal: gait and station Limp slightly to right, muscle tone and strength are normal, no tremors or atrophy is present.  .  Neurological: coordination overall normal.  Deep tendon reflex/nerve stretch intact.  Sensation normal.  Cranial nerves II-XII intact.   Skin:   Normal overall no scars, lesions, ulcers or rashes. No psoriasis.  Psychiatric: Alert and oriented x 3.  Recent memory intact, remote memory unclear.  Normal mood and affect. Well groomed.  Good eye contact.  Cardiovascular: overall no swelling, no varicosities, no edema bilaterally, normal temperatures of the legs and arms, no clubbing, cyanosis and good capillary refill.  Lymphatic: palpation is normal.  Right knee has tenderness but no redness, no effusion and very good ROM.  Slight limp to the right.  All other systems reviewed and are negative   The patient has been educated about the nature of the problem(s) and counseled on treatment options.  The patient appeared to understand what I have discussed and is in agreement with it.  Encounter  Diagnosis  Name Primary?   Chronic knee pain after total replacement of right knee joint Yes    PLAN Call if any problems.  Precautions discussed.  Continue current medications.   Return to clinic to get records first.   Electronically Signed Sanjuana Kava, MD 10/28/202111:14 AM

## 2020-06-30 ENCOUNTER — Ambulatory Visit: Payer: Medicare Other | Admitting: Orthopaedic Surgery

## 2020-07-13 ENCOUNTER — Telehealth: Payer: Self-pay | Admitting: Orthopaedic Surgery

## 2020-07-13 NOTE — Telephone Encounter (Signed)
Patient called to ask if Dr Luna Glasgow would order pain medication due to right knee pain - last had Hydrocodone-acetaminophen/Norco/Vicodin 5-325 mg  - [employer], [address] [place]

## 2020-07-14 MED ORDER — HYDROCODONE-ACETAMINOPHEN 5-325 MG PO TABS: ORAL_TABLET | ORAL | 0 refills | Status: DC

## 2020-07-21 DIAGNOSIS — Z23 Encounter for immunization: Secondary | ICD-10-CM | POA: Diagnosis not present

## 2020-08-05 ENCOUNTER — Other Ambulatory Visit: Payer: Self-pay | Admitting: Family Medicine

## 2020-08-05 ENCOUNTER — Ambulatory Visit (INDEPENDENT_AMBULATORY_CARE_PROVIDER_SITE_OTHER): Payer: Medicare Other | Admitting: Nurse Practitioner

## 2020-08-05 ENCOUNTER — Other Ambulatory Visit: Payer: Self-pay

## 2020-08-05 ENCOUNTER — Telehealth: Payer: Self-pay | Admitting: Orthopaedic Surgery

## 2020-08-05 ENCOUNTER — Encounter (INDEPENDENT_AMBULATORY_CARE_PROVIDER_SITE_OTHER): Payer: Self-pay | Admitting: Nurse Practitioner

## 2020-08-05 VITALS — BP 128/76 | HR 51 | Temp 97.7°F | Ht 61.0 in | Wt 196.6 lb

## 2020-08-05 DIAGNOSIS — D1723 Benign lipomatous neoplasm of skin and subcutaneous tissue of right leg: Secondary | ICD-10-CM | POA: Diagnosis not present

## 2020-08-05 DIAGNOSIS — M5442 Lumbago with sciatica, left side: Secondary | ICD-10-CM | POA: Diagnosis not present

## 2020-08-05 DIAGNOSIS — D179 Benign lipomatous neoplasm, unspecified: Secondary | ICD-10-CM

## 2020-08-05 MED ORDER — MELOXICAM 7.5 MG PO TABS: 7.5000 mg | ORAL_TABLET | Freq: Every day | ORAL | 1 refills | Status: DC

## 2020-08-05 MED ORDER — CYCLOBENZAPRINE HCL 5 MG PO TABS: 5.0000 mg | ORAL_TABLET | Freq: Three times a day (TID) | ORAL | 1 refills | Status: DC | PRN

## 2020-08-05 NOTE — Telephone Encounter (Signed)
Done

## 2020-08-05 NOTE — Patient Instructions (Addendum)
Do not mix meloxicam with any other NSAIDs (aleve, ibuprofen, advile, motrin, etc). Do not take more than 15mg  in 24 hours. Do not exceed 3,000mg  of tylenol in 24 hours, but you may take the meloxicam and the tylenol at the same time. Cyclobenzaprine is a muscle relaxer and make you tired. Make sure you do not drive or operate any heavy machinery while taking this medication.   Vegetarian Eating Information Many people may prefer vegetarian eating for religious, environmental, or personal reasons. These diets are often lower in calories, salt, sugar, cholesterol, and saturated and trans fats. Vegetarian eating provides significant health benefits. People who eat a vegetarian diet often have lower rates of:  Obesity.  Diabetes.  Breast and colon cancers.  Cardiovascular and gallbladder diseases. What are the types of vegetarian eating? Cyclobenzaprine tablets What is this medicine? CYCLOBENZAPRINE (sye kloe BEN za preen) is a muscle relaxer. It is used to treat muscle pain, spasms, and stiffness. This medicine may be used for other purposes; ask your health care provider or pharmacist if you have questions. COMMON BRAND NAME(S): Fexmid, Flexeril What should I tell my health care provider before I take this medicine? They need to know if you have any of these conditions:  heart disease, irregular heartbeat, or previous heart attack  liver disease  thyroid problem  an unusual or allergic reaction to cyclobenzaprine, tricyclic antidepressants, lactose, other medicines, foods, dyes, or preservatives  pregnant or trying to get pregnant  breast-feeding How should I use this medicine? Take this medicine by mouth with a glass of water. Follow the directions on the prescription label. If this medicine upsets your stomach, take it with food or milk. Take your medicine at regular intervals. Do not take it more often than directed. Talk to your pediatrician regarding the use of this medicine in  children. Special care may be needed. Overdosage: If you think you have taken too much of this medicine contact a poison control center or emergency room at once. NOTE: This medicine is only for you. Do not share this medicine with others. What if I miss a dose? If you miss a dose, take it as soon as you can. If it is almost time for your next dose, take only that dose. Do not take double or extra doses. What may interact with this medicine? Do not take this medicine with any of the following medications:  MAOIs like Carbex, Eldepryl, Marplan, Nardil, and Parnate  narcotic medicines for cough  safinamide This medicine may also interact with the following medications:  alcohol  bupropion  antihistamines for allergy, cough and cold  certain medicines for anxiety or sleep  certain medicines for bladder problems like oxybutynin, tolterodine  certain medicines for depression like amitriptyline, fluoxetine, sertraline  certain medicines for Parkinson's disease like benztropine, trihexyphenidyl  certain medicines for seizures like phenobarbital, primidone  certain medicines for stomach problems like dicyclomine, hyoscyamine  certain medicines for travel sickness like scopolamine  general anesthetics like halothane, isoflurane, methoxyflurane, propofol  ipratropium  local anesthetics like lidocaine, pramoxine, tetracaine  medicines that relax muscles for surgery  narcotic medicines for pain  phenothiazines like chlorpromazine, mesoridazine, prochlorperazine, thioridazine  verapamil This list may not describe all possible interactions. Give your health care provider a list of all the medicines, herbs, non-prescription drugs, or dietary supplements you use. Also tell them if you smoke, drink alcohol, or use illegal drugs. Some items may interact with your medicine. What should I watch for while using this medicine? Tell  your doctor or health care professional if your symptoms  do not start to get better or if they get worse. You may get drowsy or dizzy. Do not drive, use machinery, or do anything that needs mental alertness until you know how this medicine affects you. Do not stand or sit up quickly, especially if you are an older patient. This reduces the risk of dizzy or fainting spells. Alcohol may interfere with the effect of this medicine. Avoid alcoholic drinks. If you are taking another medicine that also causes drowsiness, you may have more side effects. Give your health care provider a list of all medicines you use. Your doctor will tell you how much medicine to take. Do not take more medicine than directed. Call emergency for help if you have problems breathing or unusual sleepiness. Your mouth may get dry. Chewing sugarless gum or sucking hard candy, and drinking plenty of water may help. Contact your doctor if the problem does not go away or is severe. What side effects may I notice from receiving this medicine? Side effects that you should report to your doctor or health care professional as soon as possible:  allergic reactions like skin rash, itching or hives, swelling of the face, lips, or tongue  breathing problems  chest pain  fast, irregular heartbeat  hallucinations  seizures  unusually weak or tired Side effects that usually do not require medical attention (report to your doctor or health care professional if they continue or are bothersome):  headache  nausea, vomiting This list may not describe all possible side effects. Call your doctor for medical advice about side effects. You may report side effects to FDA at 1-800-FDA-1088. Where should I keep my medicine? Keep out of the reach of children. Store at room temperature between 15 and 30 degrees C (59 and 86 degrees F). Keep container tightly closed. Throw away any unused medicine after the expiration date. NOTE: This sheet is a summary. It may not cover all possible information. If  you have questions about this medicine, talk to your doctor, pharmacist, or health care provider.  2020 Elsevier/Gold Standard (2018-07-18 12:49:26)  Vegetarian eating includes dietary choices that focus on eating mostly vegetables and fruit, grains, beans, nuts, and seeds. There are several different types of vegetarian eating. Talk with a diet and nutrition specialist (dietitian) about what type of vegetarian diet is best for you. Lacto-ovo vegetarian  Recommended foods: fruits and vegetables, milk and dairy, eggs, grains, soy and vegetable protein, beans, nuts, and seeds.  Foods to avoid: meat, poultry, seafood, animal-based broths and gravies, and gelatin. Lacto-vegetarian  Recommended foods: fruits and vegetables, milk and dairy, grains, soy and vegetable protein, beans, nuts, and seeds.  Foods to avoid: meat, poultry, seafood, animal-based broths and gravies, gelatin, and eggs. Vegan  Recommended foods: fruits and vegetables, grains, soy and vegetable protein, beans, nuts, and seeds.  Foods to avoid: meat, poultry, seafood, animal-based broths and gravies, gelatin, eggs, milk and dairy, and honey. What do I need to know about vegetarian eating? All vegetarian diets restrict proteins that come from animals. Foods that come from animals have important nutrients, such as protein, fats, vitamins, and minerals. It is important to get these nutrients from other types of foods. If you think you may not be getting the right nutrients, or if you do not eat any animal products, talk with your health care provider or dietitian about taking supplements. A dietitian can help determine your individual nutrient needs. What are tips for  following this plan? Eat a diet that includes a variety of fruits, vegetables, whole grains, and protein sources. This is important to make sure you get enough of the following nutrients: Protein Healthy protein sources include:  Eggs, milk, and cheese. Soy  products. Tofu, tempeh, and textured vegetable protein (TVP). Quinoa. Hemp seeds. Other protein sources include:  Beans, such as black beans or kidney beans. Other legumes, such as lentils and split peas. Nuts, such as almonds, [place] nuts, and pecans. Seeds, such as sunflower seeds. To get the most benefit from plant-based proteins, combine two or more sources of plant protein with whole grains in one dish. Examples include beans and rice, almond butter on bread, or sunflower seeds on noodles. Vitamin B12 Sources of vitamin B12 include:  Cheese and eggs. Breakfast cereals and other prepared products that have vitamin B12 added (fortified products). If you eat a vegan diet, ask your health care provider or dietitian about taking a B12 supplement. Vitamin D Good sources of vitamin D include:  Egg yolks. Fortified dairy products. Fortified orange juice. Mushrooms. Cereals with added vitamin D. Another way of getting vitamin D is to spend 10 minutes each day in the sun. This helps your body make its own vitamin D. Depending on your age, you may need to take a vitamin D supplement. Talk with your health care provider or dietitian about how much vitamin D you need in a supplement. Iron Healthy sources of iron include:  Dark, leafy greens. Nuts. Beans. Grain products that are fortified with iron, such as cereals. Tofu, tempeh, soybeans, and quinoa. To get the most iron from plant-based foods:  Eat iron-containing plant-based foods with vitamin C. For example, squeeze fresh lemon juice over cooked greens like kale, chard, or spinach, or have a glass of orange juice with your meals.  Avoid eating dairy products, coffee, or tea with iron-containing foods. Omega-3 fatty acids Good sources of omega-3 fatty acids include:  Walnuts. Flax seeds, canola oil, soybean oil, and tofu. Avocados. Olives and olive oil. Foods with added omega-3 fatty acids, such as eggs, milk, and juices. Calcium Good sources  of calcium include:  Dairy products. Fortified non-dairy milk. Fortified tofu. Dark, leafy greens, such as kale, bok choy, Chinese cabbage, collard greens and mustard greens. Broccoli. Okra. Fortified breakfast cereals and fruit juices. Figs. Zinc Good sources of zinc include:  Pumpkin seeds. Legumes, such as chickpeas, kidney beans, and green peas. Wheat germ, whole grains, and fortified cereals. Mushrooms. Spinach and kale. Milk and dairy foods. Dark chocolate. Summary  Vegetarian eating is a choice made by people who prefer vegetarian eating for religious, environmental, or personal reasons. These diets can provide significant health benefits.  There are several types of vegetarian diets, but all restrict proteins that come from animals.  It is important to make sure that you are getting enough nutrients, including protein, vitamin B12, vitamin D, iron, omega-3 fatty acids, calcium, and zinc from your diet.  If you think you are not getting the right nutrients or if you do not eat any animal products, talk with your health care provider or dietitian. This information is not intended to replace advice given to you by your health care provider. Make sure you discuss any questions you have with your health care provider. Document Revised: 10/18/2016 Document Reviewed: 10/18/2016 Elsevier Patient Education  [place].  Meloxicam tablets What is this medicine? MELOXICAM (mel OX i cam) is a non-steroidal anti-inflammatory drug (NSAID). It is used to reduce swelling  and to treat pain. It may be used for osteoarthritis, rheumatoid arthritis, or juvenile rheumatoid arthritis. This medicine may be used for other purposes; ask your health care provider or pharmacist if you have questions. COMMON BRAND NAME(S): Mobic What should I tell my health care provider before I take this medicine? They need to know if you have any of these conditions:  bleeding disorders  cigarette  smoker  coronary artery bypass graft (CABG) surgery within the past 2 weeks  drink more than 3 alcohol-containing drinks per day  heart disease  high blood pressure  history of stomach bleeding  kidney disease  liver disease  lung or breathing disease, like asthma  stomach or intestine problems  an unusual or allergic reaction to meloxicam, aspirin, other NSAIDs, other medicines, foods, dyes, or preservatives  pregnant or trying to get pregnant  breast-feeding How should I use this medicine? Take this medicine by mouth with a full glass of water. Follow the directions on the prescription label. You can take it with or without food. If it upsets your stomach, take it with food. Take your medicine at regular intervals. Do not take it more often than directed. Do not stop taking except on your doctor's advice. A special MedGuide will be given to you by the pharmacist with each prescription and refill. Be sure to read this information carefully each time. Talk to your pediatrician regarding the use of this medicine in children. While this drug may be prescribed for selected conditions, precautions do apply. Patients over 11 years old may have a stronger reaction and need a smaller dose. Overdosage: If you think you have taken too much of this medicine contact a poison control center or emergency room at once. NOTE: This medicine is only for you. Do not share this medicine with others. What if I miss a dose? If you miss a dose, take it as soon as you can. If it is almost time for your next dose, take only that dose. Do not take double or extra doses. What may interact with this medicine? Do not take this medicine with any of the following medications:  cidofovir  ketorolac This medicine may also interact with the following medications:  aspirin and aspirin-like medicines  certain medicines for blood pressure, heart disease, irregular heart beat  certain medicines for  depression, anxiety, or psychotic disturbances  certain medicines that treat or prevent blood clots like warfarin, enoxaparin, dalteparin, apixaban, dabigatran, rivaroxaban  cyclosporine  diuretics  fluconazole  lithium  methotrexate  other NSAIDs, medicines for pain and inflammation, like ibuprofen and naproxen  pemetrexed This list may not describe all possible interactions. Give your health care provider a list of all the medicines, herbs, non-prescription drugs, or dietary supplements you use. Also tell them if you smoke, drink alcohol, or use illegal drugs. Some items may interact with your medicine. What should I watch for while using this medicine? Tell your doctor or healthcare provider if your symptoms do not start to get better or if they get worse. This medicine may cause serious skin reactions. They can happen weeks to months after starting the medicine. Contact your healthcare provider right away if you notice fevers or flu-like symptoms with a rash. The rash may be red or purple and then turn into blisters or peeling of the skin. Or, you might notice a red rash with swelling of the face, lips or lymph nodes in your neck or under your arms. Do not take other medicines that contain  aspirin, ibuprofen, or naproxen with this medicine. Side effects such as stomach upset, nausea, or ulcers may be more likely to occur. Many medicines available without a prescription should not be taken with this medicine. This medicine can cause ulcers and bleeding in the stomach and intestines at any time during treatment. This can happen with no warning and may cause death. There is increased risk with taking this medicine for a long time. Smoking, drinking alcohol, older age, and poor health can also increase risks. Call your doctor right away if you have stomach pain or blood in your vomit or stool. This medicine does not prevent heart attack or stroke. In fact, this medicine may increase the chance  of a heart attack or stroke. The chance may increase with longer use of this medicine and in people who have heart disease. If you take aspirin to prevent heart attack or stroke, talk with your doctor or healthcare provider. What side effects may I notice from receiving this medicine? Side effects that you should report to your doctor or health care professional as soon as possible:  allergic reactions like skin rash, itching or hives, swelling of the face, lips, or tongue  nausea, vomiting  redness, blistering, peeling, or loosening of the skin, including inside the mouth  signs and symptoms of a blood clot such as breathing problems; changes in vision; chest pain; severe, sudden headache; pain, swelling, warmth in the leg; trouble speaking; sudden numbness or weakness of the face, arm, or leg  signs and symptoms of bleeding such as bloody or black, tarry stools; red or dark-brown urine; spitting up blood or brown material that looks like coffee grounds; red spots on the skin; unusual bruising or bleeding from the eye, gums, or nose  signs and symptoms of liver injury like dark yellow or brown urine; general ill feeling or flu-like symptoms; light-colored stools; loss of appetite; nausea; right upper belly pain; unusually weak or tired; yellowing of the eyes or skin  signs and symptoms of stroke like changes in vision; confusion; trouble speaking or understanding; severe headaches; sudden numbness or weakness of the face, arm, or leg; trouble walking; dizziness; loss of balance or coordination Side effects that usually do not require medical attention (report to your doctor or health care professional if they continue or are bothersome):  constipation  diarrhea  gas This list may not describe all possible side effects. Call your doctor for medical advice about side effects. You may report side effects to FDA at 1-800-FDA-1088. Where should I keep my medicine? Keep out of the reach of  children. Store at room temperature between 15 and 30 degrees C (59 and 86 degrees F). Throw away any unused medicine after the expiration date. NOTE: This sheet is a summary. It may not cover all possible information. If you have questions about this medicine, talk to your doctor, pharmacist, or health care provider.  2020 Elsevier/Gold Standard (2018-11-14 11:21:28)

## 2020-08-05 NOTE — Telephone Encounter (Signed)
Patient came to office earlier this morning with question about her records request. She has received a request form from [place] provider and [place], which she has signed.  We will complete and fax for records to come to our clinic for Dr Luna Glasgow to review. Patient then also asked about scheduling an appointment for tomorrow when Dr Luna Glasgow is in clinic.  Offered 9:40am, however, prior to completing entering the appointment add on, patient called back and relayed she is seeing her primary care, Dr Anastasio Champion, today for this medical issue (sciatica pain). Thanked [place] for helping her.

## 2020-08-05 NOTE — Progress Notes (Signed)
Subjective:  Patient ID: Annette Saunders, female    DOB: [birth year]  Age: 74 y.o. MRN: [id number]  CC:  Chief Complaint  Patient presents with  . Sciatica    Left leg pain from left buttock radiating into left outer thigh, Started Sunday      HPI  This patient arrives today for an acute visit the above.  She comes in today complaining of left buttock pain that seems to be shooting down her leg sometimes radiates to the front of her thigh. Walking seems to worsen the pain. She has tried Tylenol and hydrocodone without any relief in pain. She tells me the pain is 10/10 in intensity. She tells me she has had similar symptoms on the other side of her body and was told it was sciatica in the past. She has had imaging of her low back back in 2019 which did show degenerative changes in the lumbar spine. She has not tried anything ibuprofen or other over-the-counter treatments to treat the pain.  Past Medical History:  Diagnosis Date  . Anxiety   . Essential hypertension, benign 06/06/2019  . Generalized osteoarthritis   . GERD (gastroesophageal reflux disease)   . Hyperlipidemia   . Hypothyroidism, adult 06/06/2019  . Insomnia   . Malaise and fatigue 06/06/2019  . Obesity (BMI 30.0-34.9) 06/06/2019      Family History  Problem Relation Age of Onset  . Colon cancer Neg Hx     Social History   Social History Narrative   Widow for 15 years-husband died of MI.Lives alone.   Social History   Tobacco Use  . Smoking status: Never Smoker  . Smokeless tobacco: Never Used  Substance Use Topics  . Alcohol use: No    Alcohol/week: 0.0 standard drinks     Current Meds  Medication Sig  . Ascorbic Acid (VITAMIN C) 1000 MG tablet Take 1,000 mg by mouth daily.  [name] augmented betamethasone dipropionate (DIPROLENE-AF) 0.05 % cream Apply topically.  . Cholecalciferol (VITAMIN D) 50 MCG (2000 UT) tablet Take 2,000 Units by mouth daily.   . clindamycin (CLEOCIN T) 1 % lotion Apply  topically 2 (two) times daily as needed.  . dicyclomine (BENTYL) 10 MG capsule Take 1 capsule (10 mg total) by mouth 4 (four) times daily -  before meals and at bedtime. For cramping as needed  . ELDERBERRY PO Take 1 tablet by mouth daily.  [name] escitalopram (LEXAPRO) 10 MG tablet TAKE 1 TABLET BY MOUTH ONCE DAILY  . esomeprazole (NEXIUM) 40 MG capsule Take 1 capsule (40 mg total) by mouth daily. 30 minutes before meal  . NP THYROID 120 MG tablet TAKE 1 TABLET(120 MG) BY MOUTH TWICE DAILY  . progesterone (PROMETRIUM) 200 MG capsule TAKE 3 CAPSULES BY MOUTH EVERY NIGHT  . zinc gluconate 50 MG tablet Take 50 mg by mouth daily.    ROS:  Review of Systems  Constitutional: Negative for fever and weight loss.  Gastrointestinal:       (-) incontinence of bowel  Genitourinary: Negative for dysuria and hematuria.       (-) incontinence of bladder   Musculoskeletal: Positive for back pain and joint pain.  Neurological: Negative for dizziness and headaches.     Objective:   Today's Vitals: BP 128/76   Pulse (!) 51   Temp 97.7 F (36.5 C) (Temporal)   Ht 5\' 1"  (1.549 m)   Wt 196 lb 9.6 oz (89.2 kg)   SpO2 97%   BMI  37.15 kg/m  Vitals with BMI 08/05/2020 06/11/2020 06/09/2020  Height 5\' 1"  5\' 1"  5\' 1"   Weight 196 lbs 10 oz 192 lbs 192 lbs 13 oz  BMI 37.17 52.7 78.24  Systolic 235 361 443  Diastolic 76 154 84  Pulse 51 87 60     Physical Exam Vitals reviewed.  Constitutional:      Appearance: Normal appearance.  Musculoskeletal:     Cervical back: Normal.     Thoracic back: Normal.     Lumbar back: Normal.  Neurological:     Mental Status: She is alert.     Motor: Motor function is intact.     Gait: Gait abnormal (antalgic).  Psychiatric:        Behavior: Behavior is cooperative.          Assessment and Plan   1. Acute left-sided low back pain with left-sided sciatica      Plan: 1. Rx for meloxicam and cyclobenzaprine sent to patient's pharmacy. She was cautioned  not to combine meloxicam with any other NSAIDs. Drug interaction checker did flag for interaction between meloxicam and escitalopram. She was notified of this interaction was told that if she has any abdominal pain or notices any blood in her stool that she is to stop the meloxicam and let me know. She tells me she understands. She is also encouraged to use Tylenol with meloxicam and cyclobenzaprine. She did request use of steroids, however recommended that she first try the meloxicam, Tylenol, and cyclobenzaprine and if pain persists will consider adding steroids to her treatment regimen. She is agreeable to this plan.   Tests ordered No orders of the defined types were placed in this encounter.     Meds ordered this encounter  Medications  . meloxicam (MOBIC) 7.5 MG tablet    Sig: Take 1 tablet (7.5 mg total) by mouth daily.    Dispense:  30 tablet    Refill:  1    Order Specific Question:   Supervising Provider    Answer:   Hurshel Party C [[id number]]  . cyclobenzaprine (FLEXERIL) 5 MG tablet    Sig: Take 1 tablet (5 mg total) by mouth 3 (three) times daily as needed for muscle spasms.    Dispense:  30 tablet    Refill:  1    Order Specific Question:   Supervising Provider    Answer:   Doree Albee [[id number]]    Patient to follow-up as scheduled next month or sooner as needed.  Ailene Ards, NP

## 2020-08-09 ENCOUNTER — Other Ambulatory Visit: Payer: Self-pay

## 2020-08-09 ENCOUNTER — Ambulatory Visit
Admission: EM | Admit: 2020-08-09 | Discharge: 2020-08-09 | Disposition: A | Discharge status: Home / Self Care | Payer: Medicare Other | Attending: Family Medicine | Admitting: Family Medicine

## 2020-08-09 DIAGNOSIS — M5432 Sciatica, left side: Secondary | ICD-10-CM | POA: Diagnosis not present

## 2020-08-09 MED ORDER — KETOROLAC TROMETHAMINE 30 MG/ML IJ SOLN
30.0000 mg | Freq: Once | INTRAMUSCULAR | Status: AC
  Administered 2020-08-09: 15:00:00 30 mg via INTRAMUSCULAR

## 2020-08-09 MED ORDER — DEXAMETHASONE SODIUM PHOSPHATE 10 MG/ML IJ SOLN
10.0000 mg | Freq: Once | INTRAMUSCULAR | Status: AC
  Administered 2020-08-09: 15:00:00 10 mg via INTRAMUSCULAR

## 2020-08-09 NOTE — ED Triage Notes (Signed)
Pt presents with c/o low back pain that began last week. Was seen by PCP and given meloxicam and cyclobenzaprine with no relief

## 2020-08-10 ENCOUNTER — Other Ambulatory Visit (INDEPENDENT_AMBULATORY_CARE_PROVIDER_SITE_OTHER): Payer: Self-pay | Admitting: Internal Medicine

## 2020-08-10 ENCOUNTER — Telehealth (INDEPENDENT_AMBULATORY_CARE_PROVIDER_SITE_OTHER): Payer: Self-pay

## 2020-08-10 MED ORDER — PREDNISONE 20 MG PO TABS
40.0000 mg | ORAL_TABLET | Freq: Every day | ORAL | 1 refills | Status: DC
Start: 2020-08-10 — End: 2020-09-10

## 2020-08-10 NOTE — Telephone Encounter (Signed)
Patient called and stated that the Flexeril that Sarah sent in for her Sciatica did not help. Patient had to go to Urgent Care and they gave her 2 injections and they helped her left leg but the pain is still severe in her left buttock area and the patient is having difficulty walking and getting up and down from a sitting position.   Patient is requesting for Dr. Anastasio Champion to send her in something for the discomfort.

## 2020-08-10 NOTE — Telephone Encounter (Signed)
Called patient and gave her the message. Patient stated that she will pick up prescription and let us know if it does not help. Patient verbalized an understanding.

## 2020-08-10 NOTE — Telephone Encounter (Signed)
I have sent a prescription for prednisone to her [facility].  Let see if this will help her.  If it does not, she needs to definitely be seen next week or earlier.

## 2020-08-13 ENCOUNTER — Telehealth (INDEPENDENT_AMBULATORY_CARE_PROVIDER_SITE_OTHER): Payer: Self-pay | Admitting: Internal Medicine

## 2020-08-13 ENCOUNTER — Other Ambulatory Visit: Payer: Self-pay

## 2020-08-13 ENCOUNTER — Ambulatory Visit (INDEPENDENT_AMBULATORY_CARE_PROVIDER_SITE_OTHER): Payer: Medicare Other | Admitting: Internal Medicine

## 2020-08-13 ENCOUNTER — Encounter (INDEPENDENT_AMBULATORY_CARE_PROVIDER_SITE_OTHER): Payer: Self-pay | Admitting: Internal Medicine

## 2020-08-13 ENCOUNTER — Ambulatory Visit: Payer: Medicare Other | Admitting: Gastroenterology

## 2020-08-13 VITALS — BP 152/89 | HR 108 | Temp 97.2°F | Resp 19 | Ht 61.0 in | Wt 196.0 lb

## 2020-08-13 DIAGNOSIS — M545 Low back pain, unspecified: Secondary | ICD-10-CM

## 2020-08-13 MED ORDER — GABAPENTIN 300 MG PO CAPS: 300.0000 mg | ORAL_CAPSULE | Freq: Three times a day (TID) | ORAL | 3 refills | Status: DC

## 2020-08-13 NOTE — Progress Notes (Signed)
Metrics: Intervention Frequency ACO  Documented Smoking Status Yearly  Screened one or more times in 24 months  Cessation Counseling or  Active cessation medication Past 24 months  Past 24 months   Guideline developer: UpToDate (See UpToDate for funding source) Date Released: 2014       Wellness Office Visit  Subjective:  Patient ID: Annette Saunders, female    DOB: 13-Jul-1946  Age: 74 y.o. MRN: [id number]  CC: Left buttock pain HPI  This lady presents with low back pain which is now transition to left buttock pain radiating anteriorly to the left groin.  She had seen Judson Roch approximately 8 days ago with similar complaints.  She was prescribed a combination of meloxicam and cyclobenzaprine with no success whatsoever.  Subsequently, I prescribed course of prednisone, again with no relief. April 2019, she had low back pain and an MRI lumbar spine was done which was abnormal.  She eventually saw Dr. Carloyn Manner, neurosurgeon in [place], who apparently told her that no surgery was needed.  I remember at that time I prescribed her gabapentin and she says that the pain did improve. The patient denies any weakness of her legs, she denies any urinary or bowel symptoms that are concerning. Past Medical History:  Diagnosis Date  . Anxiety   . Essential hypertension, benign 06/06/2019  . Generalized osteoarthritis   . GERD (gastroesophageal reflux disease)   . Hyperlipidemia   . Hypothyroidism, adult 06/06/2019  . Insomnia   . Malaise and fatigue 06/06/2019  . Obesity (BMI 30.0-34.9) 06/06/2019   Past Surgical History:  Procedure Laterality Date  . COLONOSCOPY  10/2014   Dr. Cristine Polio, outside hospital. Colonoscopy performed with Propofol, no polyps, small to medium size internal hemorrhoids noted.   [name] ECTOPIC PREGNANCY SURGERY    . ELBOW FRACTURE SURGERY    . HIATAL HERNIA REPAIR    . JOINT REPLACEMENT     bilateral knee  . TONSILLECTOMY       Family History  Problem Relation Age of Onset  . Colon  cancer Neg Hx     Social History   Social History Narrative   Widow for 50 years-husband died of MI.Lives alone.   Social History   Tobacco Use  . Smoking status: Never Smoker  . Smokeless tobacco: Never Used  Substance Use Topics  . Alcohol use: No    Alcohol/week: 0.0 standard drinks    Current Meds  Medication Sig  . Ascorbic Acid (VITAMIN C) 1000 MG tablet Take 1,000 mg by mouth daily.  [name] augmented betamethasone dipropionate (DIPROLENE-AF) 0.05 % cream Apply topically.  . Cholecalciferol (VITAMIN D) 50 MCG (2000 UT) tablet Take 2,000 Units by mouth daily.   . clindamycin (CLEOCIN T) 1 % lotion Apply topically 2 (two) times daily as needed.  . cyclobenzaprine (FLEXERIL) 5 MG tablet Take 1 tablet (5 mg total) by mouth 3 (three) times daily as needed for muscle spasms.  [name] dicyclomine (BENTYL) 10 MG capsule Take 1 capsule (10 mg total) by mouth 4 (four) times daily -  before meals and at bedtime. For cramping as needed  . ELDERBERRY PO Take 1 tablet by mouth daily.  [name] escitalopram (LEXAPRO) 10 MG tablet TAKE 1 TABLET BY MOUTH ONCE DAILY  . esomeprazole (NEXIUM) 40 MG capsule Take 1 capsule (40 mg total) by mouth daily. 30 minutes before meal  . meloxicam (MOBIC) 7.5 MG tablet Take 1 tablet (7.5 mg total) by mouth daily.  . NP THYROID 120 MG tablet TAKE 1 TABLET(120  MG) BY MOUTH TWICE DAILY  . predniSONE (DELTASONE) 20 MG tablet Take 2 tablets (40 mg total) by mouth daily with breakfast.  . progesterone (PROMETRIUM) 200 MG capsule TAKE 3 CAPSULES BY MOUTH EVERY NIGHT  . zinc gluconate 50 MG tablet Take 50 mg by mouth daily.      Depression screen [facility] 2/9 11/06/2019 10/16/2019 10/16/2019 06/06/2019  Decreased Interest 0 0 0 0  Down, Depressed, Hopeless 0 0 0 0  PHQ - 2 Score 0 0 0 0     Objective:   Today's Vitals: BP (!) 152/89 (BP Location: Left Arm, Patient Position: Sitting, Cuff Size: Normal)   Pulse (!) 108   Temp (!) 97.2 F (36.2 C) (Temporal)   Resp 19   Ht 5'  1" (1.549 m)   Wt 196 lb (88.9 kg)   SpO2 99%   BMI 37.03 kg/m  Vitals with BMI 08/13/2020 08/09/2020 08/05/2020  Height 5\' 1"  - 5\' 1"   Weight 196 lbs - 196 lbs 10 oz  BMI 57.01 - 77.93  Systolic 903 009 233  Diastolic 89 82 76  Pulse 007 93 51     Physical Exam  She does not appear to be in acute pain at the present time.  However when she does walk she clearly has some pain.  There were no obvious focal neurological signs.     Assessment   1. Acute left-sided low back pain, unspecified whether sciatica present       Tests ordered No orders of the defined types were placed in this encounter.    Plan: 1. I wonder whether this represents more of a L2/L3 lesion/radiculopathy and appears to be consistent with nerve pain.  I will try her on gabapentin and she will let me know how she does early next week.  I have also told her that should she get difficulty in walking because her legs become weak, she has urinary problems, she has bowel problems, she must go to the emergency room as this may be an emergent situation.  The patient verbalized understanding. 2. If she does not improve in the next several weeks, I will then order an MRI lumbar spine as things may have gotten worse since April 2019.   Meds ordered this encounter  Medications  . gabapentin (NEURONTIN) 300 MG capsule    Sig: Take 1 capsule (300 mg total) by mouth 3 (three) times daily.    Dispense:  90 capsule    Refill:  3    Keishla Oyer Luther Parody, MD

## 2020-08-13 NOTE — Telephone Encounter (Signed)
I guess we can see her at 5:15 today.  Thanks.

## 2020-08-17 ENCOUNTER — Telehealth (INDEPENDENT_AMBULATORY_CARE_PROVIDER_SITE_OTHER): Payer: Self-pay | Admitting: Internal Medicine

## 2020-08-17 NOTE — Telephone Encounter (Signed)
Okay, please make her an appointment to be seen by either me or Judson Roch this week.  Thanks.

## 2020-08-17 NOTE — Telephone Encounter (Signed)
Called patient and she said that she doubled up on the Gabapentin and it is not helping her and she is asking if there is something different or if she needs an MRI?  Please advise. Patient states the pain is really bothering her.

## 2020-08-17 NOTE — Telephone Encounter (Signed)
Called patient and LMOVM to return call  Left a detailed voice message for patient and gave her the message from Dr. Anastasio Champion and for her to call back if she has any questions.

## 2020-08-17 NOTE — Telephone Encounter (Signed)
Okay, tell her to double the dose of gabapentin.  We talked about this on the last visit.  If she has questions, let me know again.

## 2020-08-18 ENCOUNTER — Other Ambulatory Visit: Payer: Self-pay

## 2020-08-18 ENCOUNTER — Ambulatory Visit (INDEPENDENT_AMBULATORY_CARE_PROVIDER_SITE_OTHER): Payer: Medicare Other | Admitting: Nurse Practitioner

## 2020-08-18 ENCOUNTER — Ambulatory Visit: Payer: Medicare Other | Admitting: General Surgery

## 2020-08-18 ENCOUNTER — Encounter (INDEPENDENT_AMBULATORY_CARE_PROVIDER_SITE_OTHER): Payer: Self-pay | Admitting: Nurse Practitioner

## 2020-08-18 ENCOUNTER — Encounter: Payer: Self-pay | Admitting: General Surgery

## 2020-08-18 ENCOUNTER — Telehealth (INDEPENDENT_AMBULATORY_CARE_PROVIDER_SITE_OTHER): Payer: Self-pay | Admitting: Nurse Practitioner

## 2020-08-18 ENCOUNTER — Ambulatory Visit (INDEPENDENT_AMBULATORY_CARE_PROVIDER_SITE_OTHER): Payer: Medicare Other | Admitting: General Surgery

## 2020-08-18 VITALS — BP 130/76 | HR 41 | Temp 98.1°F | Resp 18 | Ht 61.0 in | Wt 200.0 lb

## 2020-08-18 VITALS — BP 136/87 | HR 110 | Temp 98.2°F | Resp 14 | Ht 61.0 in | Wt 200.0 lb

## 2020-08-18 DIAGNOSIS — M5416 Radiculopathy, lumbar region: Secondary | ICD-10-CM

## 2020-08-18 DIAGNOSIS — M7918 Myalgia, other site: Secondary | ICD-10-CM | POA: Diagnosis not present

## 2020-08-18 DIAGNOSIS — D179 Benign lipomatous neoplasm, unspecified: Secondary | ICD-10-CM | POA: Insufficient documentation

## 2020-08-18 DIAGNOSIS — N3090 Cystitis, unspecified without hematuria: Secondary | ICD-10-CM

## 2020-08-18 LAB — POCT URINALYSIS DIPSTICK
Bilirubin, UA: NEGATIVE
Blood, UA: POSITIVE
Glucose, UA: NEGATIVE
Nitrite, UA: NEGATIVE
Protein, UA: POSITIVE — AB
Spec Grav, UA: 1.025 (ref 1.010–1.025)
Urobilinogen, UA: 0.2 E.U./dL
pH, UA: 6 (ref 5.0–8.0)

## 2020-08-18 MED ORDER — CIPROFLOXACIN HCL 250 MG PO TABS
250.0000 mg | ORAL_TABLET | Freq: Two times a day (BID) | ORAL | 0 refills | Status: DC
Start: 2020-08-18 — End: 2020-09-10

## 2020-08-18 MED ORDER — TRAMADOL HCL 50 MG PO TABS: 50.0000 mg | ORAL_TABLET | Freq: Three times a day (TID) | ORAL | 0 refills | Status: AC | PRN

## 2020-08-18 NOTE — Progress Notes (Signed)
Subjective:  Patient ID: Annette Saunders, female    DOB: 1946/05/22  Age: 74 y.o. MRN: [id number]  CC:  Chief Complaint  Patient presents with  . Back Pain      HPI  This patient arrives today for the above.  Symptoms and originally started approximately 15 days ago.  Symptoms are mostly located in her left buttock and radiate into her groin.  The pain waxes and wanes but gets up to 10 out of 10 in intensity.  She describes as an ache.  Walking makes the pain worse.  She has tried steroids, 600 mg of gabapentin 3 times a day, and Flexeril with no improvement in her pain.  She does not remember any traumatic events that has elicited the pain, however noticed that when she got a bed one morning.  She does have a history of osteopenia.  She denies any weakness, numbness, or saddle paresthesias.  She denies any new urinary or bowel incontinence.  She denies any lesions or wounds of the skin to her left buttock.  She also mentioned upon leaving the office today that she is been having some dysuria.  She like to have her urine checked for UTI.  Past Medical History:  Diagnosis Date  . Anxiety   . Essential hypertension, benign 06/06/2019  . Generalized osteoarthritis   . GERD (gastroesophageal reflux disease)   . Hyperlipidemia   . Hypothyroidism, adult 06/06/2019  . Insomnia   . Malaise and fatigue 06/06/2019  . Obesity (BMI 30.0-34.9) 06/06/2019      Family History  Problem Relation Age of Onset  . Colon cancer Neg Hx     Social History   Social History Narrative   Widow for 34 years-husband died of MI.Lives alone.   Social History   Tobacco Use  . Smoking status: Never Smoker  . Smokeless tobacco: Never Used  Substance Use Topics  . Alcohol use: No    Alcohol/week: 0.0 standard drinks     Current Meds  Medication Sig  . Ascorbic Acid (VITAMIN C) 1000 MG tablet Take 1,000 mg by mouth daily.  [name] augmented betamethasone dipropionate (DIPROLENE-AF) 0.05 % cream  Apply topically.  . Cholecalciferol (VITAMIN D) 50 MCG (2000 UT) tablet Take 2,000 Units by mouth daily.   . clindamycin (CLEOCIN T) 1 % lotion Apply topically 2 (two) times daily as needed.  . cyclobenzaprine (FLEXERIL) 5 MG tablet Take 1 tablet (5 mg total) by mouth 3 (three) times daily as needed for muscle spasms.  [name] dicyclomine (BENTYL) 10 MG capsule Take 1 capsule (10 mg total) by mouth 4 (four) times daily -  before meals and at bedtime. For cramping as needed  . ELDERBERRY PO Take 1 tablet by mouth daily.  [name] escitalopram (LEXAPRO) 10 MG tablet TAKE 1 TABLET BY MOUTH ONCE DAILY  . esomeprazole (NEXIUM) 40 MG capsule Take 1 capsule (40 mg total) by mouth daily. 30 minutes before meal  . gabapentin (NEURONTIN) 300 MG capsule Take 1 capsule (300 mg total) by mouth 3 (three) times daily.  . meloxicam (MOBIC) 7.5 MG tablet Take 1 tablet (7.5 mg total) by mouth daily.  . NP THYROID 120 MG tablet TAKE 1 TABLET(120 MG) BY MOUTH TWICE DAILY  . predniSONE (DELTASONE) 20 MG tablet Take 2 tablets (40 mg total) by mouth daily with breakfast.  . progesterone (PROMETRIUM) 200 MG capsule TAKE 3 CAPSULES BY MOUTH EVERY NIGHT  . zinc gluconate 50 MG tablet Take 50 mg by mouth  daily.    ROS:  See HPI   Objective:   Today's Vitals: BP 130/76 (BP Location: Left Arm, Patient Position: Sitting, Cuff Size: Normal)   Pulse (!) 41   Temp 98.1 F (36.7 C) (Temporal)   Resp 18   Ht 5\' 1"  (1.549 m)   Wt 200 lb (90.7 kg)   SpO2 95%   BMI 37.79 kg/m  Vitals with BMI 08/18/2020 08/18/2020 08/13/2020  Height 5\' 1"  5\' 1"  5\' 1"   Weight 200 lbs 200 lbs 196 lbs  BMI 37.81 56.31 49.70  Systolic 263 785 885  Diastolic 76 87 89  Pulse 41 110 108     Physical Exam Musculoskeletal:     Cervical back: Normal.     Thoracic back: Normal.     Lumbar back: Normal. Negative right straight leg raise test and negative left straight leg raise test.  Skin:    General: Skin is warm and dry.     Comments: No rash  or wound noted to left buttock  Neurological:     Mental Status: She is alert.     Sensory: Sensation is intact.     Motor: Motor function is intact.     Coordination: Coordination is intact.     Gait: Gait abnormal (antalgic gait).  Psychiatric:        Mood and Affect: Mood normal.        Behavior: Behavior normal.        Thought Content: Thought content normal.        Judgment: Judgment normal.          Assessment and Plan   1. Buttock pain   2. Cystitis   3. Lumbar radiculopathy      Plan: 1.,  3.  We will order tramadol that she can take as needed for her severe pain.  We will also order x-ray of hip, pelvis, and lumbar spine.  We will attempt to order CT scan as well for further evaluation.  Etiology uncertain at this time.  Concern for occult fracture due to the severity of her pain and the fact that she has not been responding to any treatments thus far.  She was again told that if her sensation or strength changes in her legs or if she starts to experience new bowel or bladder incontinence that she needs to go to the emergency department.  Tells me she understands. 2.  Point-of-care urinalysis does show signs of urinary tract infection.  We will treat her empirically with ciprofloxacin, will send urine out for culture.   Tests ordered Orders Placed This Encounter  Procedures  . DG Hip Unilat W OR W/O Pelvis 2-3 Views Left  . DG Lumbar Spine Complete  . CT Lumbar Spine Wo Contrast  . Urinalysis with Culture Reflex  . POCT urinalysis dipstick      Meds ordered this encounter  Medications  . traMADol (ULTRAM) 50 MG tablet    Sig: Take 1 tablet (50 mg total) by mouth every 8 (eight) hours as needed for up to 5 days.    Dispense:  15 tablet    Refill:  0    Order Specific Question:   Supervising Provider    Answer:   Hurshel Party C [[id number]]  . ciprofloxacin (CIPRO) 250 MG tablet    Sig: Take 1 tablet (250 mg total) by mouth 2 (two) times daily.    Dispense:   10 tablet    Refill:  0    Order  Specific Question:   Supervising Provider    Answer:   Wilson Singer [[id number]]    Patient to follow-up in 3 weeks or sooner based on imaging results and response to pain medication.  Elenore Paddy, NP

## 2020-08-18 NOTE — Telephone Encounter (Signed)
Patient called back and I scheduled her for today at 4:30pm with Judson Roch. Patient verbalized an understanding.

## 2020-08-18 NOTE — Telephone Encounter (Addendum)
CT ordered today for further eval of low back pain. Please attempt to get this approved via insurance

## 2020-08-18 NOTE — Progress Notes (Signed)
[facility] History and Physical  Reason for Referral: ? Lipoma on right thigh  Referring Physician:  Dr. Anastasio Champion  Chief Complaint    New Patient (Initial Visit)      Annette Saunders is a 74 y.o. female.  HPI: Annette Saunders is a 75 yo who comes in with reported lump on her right thigh that has been there for years but is causing her more discomfort. She is currently being worked up for left leg pain and no improvement with gabapentin thought to be related to Sciatica.  She says that the area on the right thigh seems to be getting larger. It has never been infected or drained. She says it is tender in nature. She wants to get it removed since it is seems to be growing.   Past Medical History:  Diagnosis Date  . Anxiety   . Essential hypertension, benign 06/06/2019  . Generalized osteoarthritis   . GERD (gastroesophageal reflux disease)   . Hyperlipidemia   . Hypothyroidism, adult 06/06/2019  . Insomnia   . Malaise and fatigue 06/06/2019  . Obesity (BMI 30.0-34.9) 06/06/2019    Past Surgical History:  Procedure Laterality Date  . COLONOSCOPY  10/2014   Dr. Cristine Polio, outside hospital. Colonoscopy performed with Propofol, no polyps, small to medium size internal hemorrhoids noted.   [name] ECTOPIC PREGNANCY SURGERY    . ELBOW FRACTURE SURGERY    . HIATAL HERNIA REPAIR    . JOINT REPLACEMENT     bilateral knee  . TONSILLECTOMY      Family History  Problem Relation Age of Onset  . Colon cancer Neg Hx     Social History   Tobacco Use  . Smoking status: Never Smoker  . Smokeless tobacco: Never Used  Substance Use Topics  . Alcohol use: No    Alcohol/week: 0.0 standard drinks  . Drug use: No    Medications: I have reviewed the patient's current medications. Allergies as of 08/18/2020      Reactions   Dilantin [phenytoin Sodium Extended] Other (See Comments)   Hallucinations if large amounts   Doxycycline Hyclate    Blurry vision       Medication List        Accurate as of August 18, 2020  3:08 PM. If you have any questions, ask your nurse or doctor.        augmented betamethasone dipropionate 0.05 % cream Commonly known as: DIPROLENE-AF Apply topically.   clindamycin 1 % lotion Commonly known as: CLEOCIN T Apply topically 2 (two) times daily as needed.   cyclobenzaprine 5 MG tablet Commonly known as: FLEXERIL Take 1 tablet (5 mg total) by mouth 3 (three) times daily as needed for muscle spasms.   dicyclomine 10 MG capsule Commonly known as: BENTYL Take 1 capsule (10 mg total) by mouth 4 (four) times daily -  before meals and at bedtime. For cramping as needed   ELDERBERRY PO Take 1 tablet by mouth daily.   escitalopram 10 MG tablet Commonly known as: LEXAPRO TAKE 1 TABLET BY MOUTH ONCE DAILY   esomeprazole 40 MG capsule Commonly known as: NexIUM Take 1 capsule (40 mg total) by mouth daily. 30 minutes before meal   gabapentin 300 MG capsule Commonly known as: NEURONTIN Take 1 capsule (300 mg total) by mouth 3 (three) times daily.   meloxicam 7.5 MG tablet Commonly known as: MOBIC Take 1 tablet (7.5 mg total) by mouth daily.   NP Thyroid 120 MG tablet Generic drug: thyroid  TAKE 1 TABLET(120 MG) BY MOUTH TWICE DAILY °  °predniSONE 20 MG tablet °Commonly known as: DELTASONE °Take 2 tablets (40 mg total) by mouth daily with breakfast. °  °progesterone 200 MG capsule °Commonly known as: PROMETRIUM °TAKE 3 CAPSULES BY MOUTH EVERY NIGHT °  °vitamin C 1000 MG tablet °Take 1,000 mg by mouth daily. °  °Vitamin D 50 MCG (2000 UT) tablet °Take 2,000 Units by mouth daily. °  °zinc gluconate 50 MG tablet °Take 50 mg by mouth daily. °  °  ° ° ° °ROS:  °A comprehensive review of systems was negative except for: Gastrointestinal: positive for reflux symptoms °Musculoskeletal: positive for back pain, neck pain and joint pain, left leg pain, right thigh lump ° °Blood pressure 136/87, pulse (!) 110, temperature 98.2 °F (36.8 °C), temperature  source Oral, resp. rate 14, height 5' 1" (1.549 m), weight 200 lb (90.7 kg), SpO2 98 %. °Physical Exam °Vitals reviewed.  °HENT:  °   Head: Normocephalic.  °   Nose: Nose normal.  °   Mouth/Throat:  °   Mouth: Mucous membranes are moist.  °Eyes:  °   Extraocular Movements: Extraocular movements intact.  °Cardiovascular:  °   Rate and Rhythm: Normal rate and regular rhythm.  °Pulmonary:  °   Effort: Pulmonary effort is normal.  °   Breath sounds: Normal breath sounds.  °Abdominal:  °   General: There is no distension.  °   Palpations: Abdomen is soft.  °   Tenderness: There is no abdominal tenderness.  °Musculoskeletal:     °   General: No swelling.  °   Cervical back: Normal range of motion.  °   Comments: Right inner thigh, mid thigh with about 2cm superficial mass, tender, no central pit or sign of infection  °Skin: °   General: Skin is warm.  °Neurological:  °   General: No focal deficit present.  °   Mental Status: She is alert and oriented to person, place, and time.  °Psychiatric:     °   Mood and Affect: Mood normal.     °   Behavior: Behavior normal.     °   Thought Content: Thought content normal.     °   Judgment: Judgment normal.  ° ° ° °Results: °None  ° °Assessment & Plan:  °Annette Saunders is a 74 y.o. female with a superficial mass on the right leg possibly a lipoma versus a cyst. It has been there for years but is getting larger. She wants it removed. She is also getting worked up for her left leg pain that is separate by her PCP.  ° °Discussed excision and risk of bleeding, infection, recurrence, finding something unexpected. °Discussed doing this with local at the hospital in the procedure room to have better cautery access.  ° °All questions were answered to the satisfaction of the patient. ° ° ° ° °Kahari Critzer C Kharis Lapenna °08/18/2020, 3:08 PM  °

## 2020-08-18 NOTE — Telephone Encounter (Signed)
This encounter was created in error - please disregard.

## 2020-08-18 NOTE — Telephone Encounter (Signed)
Called patient and LMOVM to return call  Left a detailed voice message to let patient know to call back and schedule an appointment with Sarah or Dr. Anastasio Champion for this week.

## 2020-08-18 NOTE — Patient Instructions (Signed)
Lipoma  A lipoma is a noncancerous (benign) tumor that is made up of fat cells. This is a very common type of soft-tissue growth. Lipomas are usually found under the skin (subcutaneous). They may occur in any tissue of the body that contains fat. Common areas for lipomas to appear include the back, arms, shoulders, buttocks, and thighs. Lipomas grow slowly, and they are usually painless. Most lipomas do not cause problems and do not require treatment. What are the causes? The cause of this condition is not known. What increases the risk? You are more likely to develop this condition if:  You are 40-60 years old.  You have a family history of lipomas. What are the signs or symptoms? A lipoma usually appears as a small, round bump under the skin. In most cases, the lump will:  Feel soft or rubbery.  Not cause pain or other symptoms. However, if a lipoma is located in an area where it pushes on nerves, it can become painful or cause other symptoms. How is this diagnosed? A lipoma can usually be diagnosed with a physical exam. You may also have tests to confirm the diagnosis and to rule out other conditions. Tests may include:  Imaging tests, such as a CT scan or an MRI.  Removal of a tissue sample to be looked at under a microscope (biopsy). How is this treated? Treatment for this condition depends on the size of the lipoma and whether it is causing any symptoms.  For small lipomas that are not causing problems, no treatment is needed.  If a lipoma is bigger or it causes problems, surgery may be done to remove the lipoma. Lipomas can also be removed to improve appearance. Most often, the procedure is done after applying a medicine that numbs the area (local anesthetic).  Liposuction may be done to reduce the size of the lipoma before it is removed through surgery, or it may be done to remove the lipoma. Lipomas are removed with this method in order to limit incision size and scarring. A  liposuction tube is inserted through a small incision into the lipoma, and the contents of the lipoma are removed through the tube with suction. Follow these instructions at home:  Watch your lipoma for any changes.  Keep all follow-up visits as told by your health care provider. This is important. Contact a health care provider if:  Your lipoma becomes larger or hard.  Your lipoma becomes painful, red, or increasingly swollen. These could be signs of infection or a more serious condition. Get help right away if:  You develop tingling or numbness in an area near the lipoma. This could indicate that the lipoma is causing nerve damage. Summary  A lipoma is a noncancerous tumor that is made up of fat cells.  Most lipomas do not cause problems and do not require treatment.  If a lipoma is bigger or it causes problems, surgery may be done to remove the lipoma.  Contact a health care provider if your lipoma becomes larger or hard, or if it becomes painful, red, or increasingly swollen. Pain, redness, and swelling could be signs of infection or a more serious condition. This information is not intended to replace advice given to you by your health care provider. Make sure you discuss any questions you have with your health care provider. Document Revised: 04/01/2019 Document Reviewed: 04/01/2019 Elsevier Patient Education  2020 [employer].   Lipoma Removal  Lipoma removal is a surgical procedure to remove a   lipoma, which is a noncancerous (benign) tumor that is made up of fat cells. Most lipomas are small and painless and do not require treatment. They can form in many areas of the body but are most common under the skin of the back, arms, shoulders, buttocks, and thighs. You may need lipoma removal if you have a lipoma that is large, growing, or causing discomfort. Lipoma removal may also be done for cosmetic reasons. Tell a health care provider about:  Any allergies you have.  All  medicines you are taking, including vitamins, herbs, eye drops, creams, and over-the-counter medicines.  Any problems you or family members have had with anesthetic medicines.  Any blood disorders you have.  Any surgeries you have had.  Any medical conditions you have.  Whether you are pregnant or may be pregnant. What are the risks? Generally, this is a safe procedure. However, problems may occur, including:  Infection.  Bleeding.  Scarring.  Allergic reactions to medicines.  Damage to nearby structures or organs, such as damage to nerves or blood vessels near the lipoma. Medicines Ask your health care provider about:  Changing or stopping your regular medicines. This is especially important if you are taking diabetes medicines or blood thinners.  Taking medicines such as aspirin and ibuprofen. These medicines can thin your blood. Do not take these medicines unless your health care provider tells you to take them.  Taking over-the-counter medicines, vitamins, herbs, and supplements. General instructions  You will have a physical exam. Your health care provider will check the size of the lipoma and whether it can be moved easily.  You may have a biopsy and imaging tests, such as X-rays, a CT scan, and an MRI.  Do not use any products that contain nicotine or tobacco for at least 4 weeks before the procedure. These products include cigarettes, e-cigarettes, and chewing tobacco. If you need help quitting, ask your health care provider.  Ask your health care provider: ? How your surgery site will be marked. ? What steps will be taken to help prevent infection. These may include:  Washing skin with a germ-killing soap.  Taking antibiotic medicine.  Plan to have someone take you home from the hospital or clinic.  If you will be going home right after the procedure, plan to have someone with you for 24 hours. What happens during the procedure?   An IV will be inserted  into one of your veins.  You will be given one or more of the following: ? A medicine to help you relax (sedative). ? A medicine to numb the area (local anesthetic). ? A medicine to make you fall asleep (general anesthetic). ? A medicine that is injected into an area of your body to numb everything below the injection site (regional anesthetic).  An incision will be made over the lipoma or very near the lipoma. The incision may be made in a natural skin line or crease.  Tissues, nerves, and blood vessels near the lipoma will be moved out of the way.  The lipoma and the capsule that surrounds it will be separated from the surrounding tissues.  The lipoma will be removed.  The incision may be closed with stitches (sutures).  A bandage (dressing) will be placed over the incision. The procedure may vary among health care providers and hospitals. What happens after the procedure?  Your blood pressure, heart rate, breathing rate, and blood oxygen level will be monitored until you leave the hospital or clinic.    If you were prescribed an antibiotic medicine, use it as told by your health care provider. Do not stop using the antibiotic even if you start to feel better.  If you were given a sedative during the procedure, it can affect you for several hours. Do not drive or operate machinery until your health care provider says that it is safe. Summary  Before the procedure, follow instructions from your health care provider about eating and drinking, and changing or stopping your regular medicines. This is especially important if you are taking diabetes medicines or blood thinners.  After the lipoma is removed, the incision may be closed with stitches (sutures) and covered with a bandage (dressing).  If you were given a sedative during the procedure, it can affect you for several hours. Do not drive or operate machinery until your health care provider says that it is safe. This information is  not intended to replace advice given to you by your health care provider. Make sure you discuss any questions you have with your health care provider. Document Revised: 04/01/2019 Document Reviewed: 04/01/2019 Elsevier Patient Education  2020 Elsevier Inc.   

## 2020-08-18 NOTE — Patient Instructions (Signed)
Try to take the tramadol (ultram) and the cyclobenzaprine (flexeril) 2 hours apart from each other to prevent over sedation.  Tramadol tablets What is this medicine? TRAMADOL (TRA ma dole) is a pain reliever. It is used to treat moderate to severe pain in adults. This medicine may be used for other purposes; ask your health care provider or pharmacist if you have questions. COMMON BRAND NAME(S): Ultram What should I tell my health care provider before I take this medicine? They need to know if you have any of these conditions:  brain tumor  depression  drug abuse or addiction  head injury  if you frequently drink alcohol containing drinks  kidney disease or trouble passing urine  liver disease  lung disease, asthma, or breathing problems  seizures or epilepsy  suicidal thoughts, plans, or attempt; a previous suicide attempt by you or a family member  an unusual or allergic reaction to tramadol, codeine, other medicines, foods, dyes, or preservatives  pregnant or trying to get pregnant  breast-feeding How should I use this medicine? Take this medicine by mouth with a full glass of water. Follow the directions on the prescription label. You can take it with or without food. If it upsets your stomach, take it with food. Do not take your medicine more often than directed. A special MedGuide will be given to you by the pharmacist with each prescription and refill. Be sure to read this information carefully each time. Talk to your pediatrician regarding the use of this medicine in children. Special care may be needed. Overdosage: If you think you have taken too much of this medicine contact a poison control center or emergency room at once. NOTE: This medicine is only for you. Do not share this medicine with others. What if I miss a dose? If you miss a dose, take it as soon as you can. If it is almost time for your next dose, take only that dose. Do not take double or extra  doses. What may interact with this medicine? Do not take this medication with any of the following medicines:  MAOIs like Carbex, Eldepryl, Marplan, Nardil, and Parnate This medicine may also interact with the following medications:  alcohol  antihistamines for allergy, cough and cold  certain medicines for anxiety or sleep  certain medicines for depression like amitriptyline, fluoxetine, sertraline  certain medicines for migraine headache like almotriptan, eletriptan, frovatriptan, naratriptan, rizatriptan, sumatriptan, zolmitriptan  certain medicines for seizures like carbamazepine, oxcarbazepine, phenobarbital, primidone  certain medicines that treat or prevent blood clots like warfarin  digoxin  furazolidone  general anesthetics like halothane, isoflurane, methoxyflurane, propofol  linezolid  local anesthetics like lidocaine, pramoxine, tetracaine  medicines that relax muscles for surgery  other narcotic medicines for pain or cough  phenothiazines like chlorpromazine, mesoridazine, prochlorperazine, thioridazine  procarbazine This list may not describe all possible interactions. Give your health care provider a list of all the medicines, herbs, non-prescription drugs, or dietary supplements you use. Also tell them if you smoke, drink alcohol, or use illegal drugs. Some items may interact with your medicine. What should I watch for while using this medicine? Tell your health care provider if your pain does not go away, if it gets worse, or if you have new or a different type of pain. You may develop tolerance to this drug. Tolerance means that you will need a higher dose of the drug for pain relief. Tolerance is normal and is expected if you take this drug for a long  time. Do not suddenly stop taking your drug because you may develop a severe reaction. Your body becomes used to the drug. This does NOT mean you are addicted. Addiction is a behavior related to getting and  using a drug for a nonmedical reason. If you have pain, you have a medical reason to take pain drug. Your health care provider will tell you how much drug to take. If your health care provider wants you to stop the drug, the dose will be slowly lowered over time to avoid any side effects. If you take other drugs that also cause drowsiness like other narcotic pain drugs, benzodiazepines, or other drugs for sleep, you may have more side effects. Give your health care provider a list of all drugs you use. He or she will tell you how much drug to take. Do not take more drug than directed. Get emergency help right away if you have trouble breathing or are unusually tired or sleepy. Talk to your health care provider about naloxone and how to get it. Naloxone is an emergency drug used for an opioid overdose. An overdose can happen if you take too much opioid. It can also happen if an opioid is taken with some other drugs or substances, like alcohol. Know the symptoms of an overdose, like trouble breathing, unusually tired or sleepy, or not being able to respond or wake up. Make sure to tell caregivers and close contacts where it is stored. Make sure they know how to use it. After naloxone is given, you must get emergency help right away. Naloxone is a temporary treatment. Repeat doses may be needed. This drug may cause serious skin reactions. They can happen weeks to months after starting the drug. Contact your health care provider right away if you notice fevers or flu-like symptoms with a rash. The rash may be red or purple and then turn into blisters or peeling of the skin. Or, you might notice a red rash with swelling of the face, lips, or lymph nodes in your neck or under your arms. You may get drowsy or dizzy. Do not drive, use machinery, or do anything that needs mental alertness until you know how this drug affects you. Do not stand up or sit up quickly, especially if you are an older patient. This reduces the  risk of dizzy or fainting spells. Alcohol may interfere with the effect of this drug. Avoid alcoholic drinks. This drug will cause constipation. If you do not have a bowel movement for 3 days, call your health care provider. Your mouth may get dry. Chewing sugarless gum or sucking hard candy and drinking plenty of water may help. Contact your health care provider if the problem does not go away or is severe. What side effects may I notice from receiving this medicine? Side effects that you should report to your doctor or health care professional as soon as possible:  allergic reactions like skin rash, itching or hives, swelling of the face, lips, or tongue  breathing problems  confusion  redness, blistering, peeling or loosening of the skin, including inside the mouth  seizures  signs and symptoms of low blood pressure like dizziness; feeling faint or lightheaded, falls; unusually weak or tired  trouble passing urine or change in the amount of urine Side effects that usually do not require medical attention (report to your doctor or health care professional if they continue or are bothersome):  constipation  dry mouth  nausea, vomiting  tiredness This list may  not describe all possible side effects. Call your doctor for medical advice about side effects. You may report side effects to FDA at 1-800-FDA-1088. Where should I keep my medicine? Keep out of the reach of children. This medicine may cause accidental overdose and death if it taken by other adults, children, or pets. Mix any unused medicine with a substance like cat litter or coffee grounds. Then throw the medicine away in a sealed container like a sealed bag or a coffee can with a lid. Do not use the medicine after the expiration date. Store at room temperature between 15 and 30 degrees C (59 and 86 degrees F). NOTE: This sheet is a summary. It may not cover all possible information. If you have questions about this medicine,  talk to your doctor, pharmacist, or health care provider.  2020 Elsevier/Gold Standard (2019-03-25 13:08:25)

## 2020-08-19 ENCOUNTER — Telehealth (INDEPENDENT_AMBULATORY_CARE_PROVIDER_SITE_OTHER): Payer: Self-pay | Admitting: Nurse Practitioner

## 2020-08-19 ENCOUNTER — Ambulatory Visit (HOSPITAL_COMMUNITY)
Admission: RE | Admit: 2020-08-19 | Discharge: 2020-08-19 | Disposition: A | Discharge status: Home / Self Care | Payer: Medicare Other | Source: Ambulatory Visit | Attending: Nurse Practitioner | Admitting: Nurse Practitioner

## 2020-08-19 DIAGNOSIS — M545 Low back pain, unspecified: Secondary | ICD-10-CM | POA: Diagnosis not present

## 2020-08-19 DIAGNOSIS — M7918 Myalgia, other site: Secondary | ICD-10-CM

## 2020-08-19 DIAGNOSIS — M25552 Pain in left hip: Secondary | ICD-10-CM | POA: Diagnosis not present

## 2020-08-19 NOTE — Telephone Encounter (Signed)
I called patient to discuss her x-ray results with her.  I recommended that she try taking her tramadol in addition to Advil and Tylenol as needed for pain medicine as she tells me that her pain is still pretty severe.  I did discuss the x-ray results and patient's pain with Dr. Anastasio Champion and recommendation is to offer referral to physical rehabilitative medicine, patient tells me she is scheduled for surgical procedure next week and would like to hold off on referral to rehabilitative medicine at this time.  Will try to touch base with her again in approximately 2 weeks to see how she is doing and possibly refer her at that time.

## 2020-08-19 NOTE — Telephone Encounter (Signed)
Pt is set for 1/6 @7pm  [facility].

## 2020-08-20 ENCOUNTER — Telehealth (INDEPENDENT_AMBULATORY_CARE_PROVIDER_SITE_OTHER): Payer: Self-pay

## 2020-08-20 NOTE — Telephone Encounter (Signed)
Patient called back and stated that she was not having burning with urination anymore and she thought the antibiotics are working.  I advised the patient to check her pulse and she got a reading of 76.  Sending as Juluis Rainier.

## 2020-08-20 NOTE — H&P (Signed)
[facility] History and Physical  Reason for Referral: ? Lipoma on right thigh  Referring Physician:  Dr. Anastasio Champion  Chief Complaint    New Patient (Initial Visit)      Annette Saunders is a 74 y.o. female.  HPI: Annette Saunders is a 74 yo who comes in with reported lump on her right thigh that has been there for years but is causing her more discomfort. She is currently being worked up for left leg pain and no improvement with gabapentin thought to be related to Sciatica.  She says that the area on the right thigh seems to be getting larger. It has never been infected or drained. She says it is tender in nature. She wants to get it removed since it is seems to be growing.   Past Medical History:  Diagnosis Date   Anxiety    Essential hypertension, benign 06/06/2019   Generalized osteoarthritis    GERD (gastroesophageal reflux disease)    Hyperlipidemia    Hypothyroidism, adult 06/06/2019   Insomnia    Malaise and fatigue 06/06/2019   Obesity (BMI 30.0-34.9) 06/06/2019    Past Surgical History:  Procedure Laterality Date   COLONOSCOPY  10/2014   Dr. Cristine Polio, outside hospital. Colonoscopy performed with Propofol, no polyps, small to medium size internal hemorrhoids noted.    ECTOPIC PREGNANCY SURGERY     ELBOW FRACTURE SURGERY     HIATAL HERNIA REPAIR     JOINT REPLACEMENT     bilateral knee   TONSILLECTOMY      Family History  Problem Relation Age of Onset   Colon cancer Neg Hx     Social History   Tobacco Use   Smoking status: Never Smoker   Smokeless tobacco: Never Used  Substance Use Topics   Alcohol use: No    Alcohol/week: 0.0 standard drinks   Drug use: No    Medications: I have reviewed the patient's current medications. Allergies as of 08/18/2020      Reactions   Dilantin [phenytoin Sodium Extended] Other (See Comments)   Hallucinations if large amounts   Doxycycline Hyclate    Blurry vision       Medication List        Accurate as of August 18, 2020  3:08 PM. If you have any questions, ask your nurse or doctor.        augmented betamethasone dipropionate 0.05 % cream Commonly known as: DIPROLENE-AF Apply topically.   clindamycin 1 % lotion Commonly known as: CLEOCIN T Apply topically 2 (two) times daily as needed.   cyclobenzaprine 5 MG tablet Commonly known as: FLEXERIL Take 1 tablet (5 mg total) by mouth 3 (three) times daily as needed for muscle spasms.   dicyclomine 10 MG capsule Commonly known as: BENTYL Take 1 capsule (10 mg total) by mouth 4 (four) times daily -  before meals and at bedtime. For cramping as needed   ELDERBERRY PO Take 1 tablet by mouth daily.   escitalopram 10 MG tablet Commonly known as: LEXAPRO TAKE 1 TABLET BY MOUTH ONCE DAILY   esomeprazole 40 MG capsule Commonly known as: NexIUM Take 1 capsule (40 mg total) by mouth daily. 30 minutes before meal   gabapentin 300 MG capsule Commonly known as: NEURONTIN Take 1 capsule (300 mg total) by mouth 3 (three) times daily.   meloxicam 7.5 MG tablet Commonly known as: MOBIC Take 1 tablet (7.5 mg total) by mouth daily.   NP Thyroid 120 MG tablet Generic drug: thyroid  TAKE 1 TABLET(120 MG) BY MOUTH TWICE DAILY   predniSONE 20 MG tablet Commonly known as: DELTASONE Take 2 tablets (40 mg total) by mouth daily with breakfast.   progesterone 200 MG capsule Commonly known as: PROMETRIUM TAKE 3 CAPSULES BY MOUTH EVERY NIGHT   vitamin C 1000 MG tablet Take 1,000 mg by mouth daily.   Vitamin D 50 MCG (2000 UT) tablet Take 2,000 Units by mouth daily.   zinc gluconate 50 MG tablet Take 50 mg by mouth daily.        ROS:  A comprehensive review of systems was negative except for: Gastrointestinal: positive for reflux symptoms Musculoskeletal: positive for back pain, neck pain and joint pain, left leg pain, right thigh lump  Blood pressure 136/87, pulse (!) 110, temperature 98.2 F (36.8 C), temperature  source Oral, resp. rate 14, height 5\' 1"  (1.549 m), weight 200 lb (90.7 kg), SpO2 98 %. Physical Exam Vitals reviewed.  HENT:     Head: Normocephalic.     Nose: Nose normal.     Mouth/Throat:     Mouth: Mucous membranes are moist.  Eyes:     Extraocular Movements: Extraocular movements intact.  Cardiovascular:     Rate and Rhythm: Normal rate and regular rhythm.  Pulmonary:     Effort: Pulmonary effort is normal.     Breath sounds: Normal breath sounds.  Abdominal:     General: There is no distension.     Palpations: Abdomen is soft.     Tenderness: There is no abdominal tenderness.  Musculoskeletal:        General: No swelling.     Cervical back: Normal range of motion.     Comments: Right inner thigh, mid thigh with about 2cm superficial mass, tender, no central pit or sign of infection  Skin:    General: Skin is warm.  Neurological:     General: No focal deficit present.     Mental Status: She is alert and oriented to person, place, and time.  Psychiatric:        Mood and Affect: Mood normal.        Behavior: Behavior normal.        Thought Content: Thought content normal.        Judgment: Judgment normal.     Results: None   Assessment & Plan:  Annette Saunders is a 74 y.o. female with a superficial mass on the right leg possibly a lipoma versus a cyst. It has been there for years but is getting larger. She wants it removed. She is also getting worked up for her left leg pain that is separate by her PCP.   Discussed excision and risk of bleeding, infection, recurrence, finding something unexpected. Discussed doing this with local at the hospital in the procedure room to have better cautery access.   All questions were answered to the satisfaction of the patient.     Virl Cagey 08/18/2020, 3:08 PM

## 2020-08-20 NOTE — Telephone Encounter (Signed)
Perfect, sounds great. Still waiting on urine culture. I will call the patient if urine culture shows resistant bacteria to cipro. Thank you.

## 2020-08-21 LAB — URINALYSIS W MICROSCOPIC + REFLEX CULTURE
Bilirubin Urine: NEGATIVE
Crystals: NONE SEEN /HPF
Glucose, UA: NEGATIVE
Hgb urine dipstick: NEGATIVE
Ketones, ur: NEGATIVE
Nitrites, Initial: NEGATIVE
Protein, ur: NEGATIVE
RBC / HPF: NONE SEEN /HPF (ref 0–2)
Specific Gravity, Urine: 1.018 (ref 1.001–1.03)
pH: 5.5 (ref 5.0–8.0)

## 2020-08-21 LAB — URINE CULTURE

## 2020-08-21 LAB — CULTURE INDICATED

## 2020-08-25 ENCOUNTER — Other Ambulatory Visit (INDEPENDENT_AMBULATORY_CARE_PROVIDER_SITE_OTHER): Payer: Self-pay | Admitting: Internal Medicine

## 2020-08-26 ENCOUNTER — Encounter (HOSPITAL_COMMUNITY)
Admission: RE | Disposition: A | Discharge status: Home / Self Care | Payer: Self-pay | Source: Home / Self Care | Attending: General Surgery

## 2020-08-26 ENCOUNTER — Ambulatory Visit (HOSPITAL_COMMUNITY)
Admission: RE | Admit: 2020-08-26 | Discharge: 2020-08-26 | Disposition: A | Discharge status: Home / Self Care | Payer: Medicare Other | Attending: General Surgery | Admitting: General Surgery

## 2020-08-26 DIAGNOSIS — D1723 Benign lipomatous neoplasm of skin and subcutaneous tissue of right leg: Secondary | ICD-10-CM | POA: Diagnosis not present

## 2020-08-26 DIAGNOSIS — M5442 Lumbago with sciatica, left side: Secondary | ICD-10-CM

## 2020-08-26 DIAGNOSIS — Z9109 Other allergy status, other than to drugs and biological substances: Secondary | ICD-10-CM | POA: Diagnosis not present

## 2020-08-26 DIAGNOSIS — Z79899 Other long term (current) drug therapy: Secondary | ICD-10-CM | POA: Insufficient documentation

## 2020-08-26 DIAGNOSIS — Z96653 Presence of artificial knee joint, bilateral: Secondary | ICD-10-CM | POA: Diagnosis not present

## 2020-08-26 DIAGNOSIS — Z881 Allergy status to other antibiotic agents status: Secondary | ICD-10-CM | POA: Diagnosis not present

## 2020-08-26 DIAGNOSIS — R2241 Localized swelling, mass and lump, right lower limb: Secondary | ICD-10-CM

## 2020-08-26 HISTORY — PX: LIPOMA EXCISION: SHX5283

## 2020-08-26 SURGERY — MINOR EXCISION LIPOMA
Anesthesia: LOCAL | Laterality: Right

## 2020-08-26 MED ORDER — HYDROCODONE-ACETAMINOPHEN 5-325 MG PO TABS: 1.0000 | ORAL_TABLET | ORAL | 0 refills | Status: DC | PRN

## 2020-08-26 MED ORDER — LIDOCAINE HCL (PF) 1 % IJ SOLN
INTRAMUSCULAR | Status: AC
  Filled 2020-08-26: qty 30

## 2020-08-26 MED ORDER — CYCLOBENZAPRINE HCL 5 MG PO TABS: 5.0000 mg | ORAL_TABLET | Freq: Three times a day (TID) | ORAL | 0 refills | Status: DC | PRN

## 2020-08-26 MED ORDER — LIDOCAINE HCL (PF) 1 % IJ SOLN
INTRAMUSCULAR | Status: DC | PRN
  Administered 2020-08-26: 8 mL

## 2020-08-26 SURGICAL SUPPLY — 15 items
DERMABOND ADVANCED (GAUZE/BANDAGES/DRESSINGS) ×1
DERMABOND ADVANCED .7 DNX12 (GAUZE/BANDAGES/DRESSINGS) ×1 IMPLANT
DRSG TEGADERM 2-3/8X2-3/4 SM (GAUZE/BANDAGES/DRESSINGS) IMPLANT
ELECT NEEDLE TIP 2.8 STRL (NEEDLE) ×2 IMPLANT
ELECT REM PT RETURN 9FT ADLT (ELECTROSURGICAL) ×2
ELECTRODE REM PT RTRN 9FT ADLT (ELECTROSURGICAL) ×1 IMPLANT
NS IRRIG 1000ML POUR BTL (IV SOLUTION) ×2 IMPLANT
PENCIL HANDSWITCHING (ELECTRODE) ×2 IMPLANT
SPONGE GAUZE 2X2 8PLY STRL LF (GAUZE/BANDAGES/DRESSINGS) ×2 IMPLANT
SUT ETHILON 3 0 FSL (SUTURE) IMPLANT
SUT MNCRL AB 4-0 PS2 18 (SUTURE) ×2 IMPLANT
SUT PROLENE 4 0 PS 2 18 (SUTURE) IMPLANT
SUT VIC AB 3-0 SH 27 (SUTURE) ×1
SUT VIC AB 3-0 SH 27X BRD (SUTURE) ×1 IMPLANT
TOWEL OR 17X26 4PK STRL BLUE (TOWEL DISPOSABLE) ×2 IMPLANT

## 2020-08-26 NOTE — Discharge Instructions (Signed)
Discharge Instructions:  Common Complaints: Pain at the incision site is common.  Bruising is also common.   Diet/ Activity: Diet as tolerated. Shower per your regular routine daily.  Do not take hot showers. Take warm showers that are less than 10 minutes. Walk everyday for at least 15-20 minutes. Deep cough and move around every 1-2 hours in the first few days after surgery.  Limit excessive movement, lifting > 10 lbs, stretching with the limb if there is an incision on your arm/armpit or leg.   Limit stretching, pulling on your incision if it is located on other parts of your body.  Do not pick at the dermabond glue on your incision sites. This glue film will remain in place for 1-2 weeks and will start to peel off.  Do not place lotions or balms on your incision unless instructed to specifically by Dr. Henreitta Leber.   Medication: Take tylenol and ibuprofen as needed for pain control, alternating every 4-6 hours.  Take Hydrocodone or breakthrough pain every 4 hours.  Do not take your flexeril at the same time as your pain medication.  Take Colace for constipation related to narcotic pain medication. If you do not have a bowel movement in 2 days, take Miralax over the counter.  Drink plenty of water to also prevent constipation.   Contact Information: If you have questions or concerns, please call our office, [phone], Monday- Thursday 8AM-5PM and Friday 8AM-12Noon.  If it is after hours or on the weekend, please call [facility] Main Number, [phone], and ask to speak to the surgeon on call for Dr. Henreitta Leber at [facility].

## 2020-08-26 NOTE — Progress Notes (Signed)
[facility]  Excision done. Norco for breakthrough pain. Patient's PCP not in town and gave 1 time refill flexeril she uses for right hip pain.  Algis Greenhouse, MD [facility] [address] [place], [place] [address] [phone] (office)

## 2020-08-26 NOTE — Op Note (Signed)
[facility] Operative Note  08/26/20  Preoperative Diagnosis: Right leg lipoma    Postoperative Diagnosis: Same   Procedure(s) Performed: Excision of mass right thigh 2cm    Surgeon: Leatrice Jewels. Henreitta Leber, MD   Assistants: No qualified resident was available    Anesthesia: 1% lidocaine    Specimens: Lipoma    Estimated Blood Loss: Minimal   Wound Class:Clean   Operative Indications: Annette Saunders  Is a 74 yo with a mass area on the right inner thigh. This is growing and causing her discomfort. Clinically it feels like a lipoma. We discussed excision and risk of bleeding, infection, recurrence, and finding something other than a lipoma on pathology.  Findings: Globular appearing fat, consistent with lipoma    Procedure: The patient was taken to the procedure room and placed semi upright with the right leg frog legged.  The right leg was prepared and draped in the usual sterile fashion.   Lidocaine 1% was injected into the area. An incision was made over the mass and carried down into the adipose tissue. A globular area of fat was excised with sharp and blunt dissection using scissors. The lipomatous lesion was about 2cm in size. The cavity was made hemostatic. Vircyl 3-0 interrupts closed the dermal layer and a subcuticular 4-0 Monocryl closed the skin. Dermabond was placed.   Final inspection revealed acceptable hemostasis. All counts were correct at the end of the case. The patient tolerated the procedure well.    Annette Greenhouse, MD [facility] [address], [place] [address] [phone] (office)

## 2020-08-26 NOTE — Interval H&P Note (Signed)
History and Physical Interval Note:  08/26/2020 10:35 AM  Annette Saunders  has presented today for surgery, with the diagnosis of Lipoma, thigh.  The various methods of treatment have been discussed with the patient and family. After consideration of risks, benefits and other options for treatment, the patient has consented to  Procedure(s): MINOR EXCISION LIPOMA;THIGH (Right) as a surgical intervention.  The patient's history has been reviewed, patient examined, no change in status, stable for surgery.  I have reviewed the patient's chart and labs.  Questions were answered to the patient's satisfaction.    No changes. Area marked.  Lucretia Roers

## 2020-08-31 ENCOUNTER — Encounter (HOSPITAL_COMMUNITY): Payer: Self-pay | Admitting: General Surgery

## 2020-08-31 LAB — SURGICAL PATHOLOGY

## 2020-09-03 ENCOUNTER — Ambulatory Visit (HOSPITAL_COMMUNITY): Payer: Medicare Other

## 2020-09-05 ENCOUNTER — Other Ambulatory Visit (INDEPENDENT_AMBULATORY_CARE_PROVIDER_SITE_OTHER): Payer: Self-pay | Admitting: Nurse Practitioner

## 2020-09-05 DIAGNOSIS — M5442 Lumbago with sciatica, left side: Secondary | ICD-10-CM

## 2020-09-08 ENCOUNTER — Telehealth (INDEPENDENT_AMBULATORY_CARE_PROVIDER_SITE_OTHER): Payer: Medicare Other | Admitting: General Surgery

## 2020-09-08 DIAGNOSIS — Z08 Encounter for follow-up examination after completed treatment for malignant neoplasm: Secondary | ICD-10-CM | POA: Diagnosis not present

## 2020-09-08 DIAGNOSIS — B029 Zoster without complications: Secondary | ICD-10-CM | POA: Diagnosis not present

## 2020-09-08 DIAGNOSIS — D179 Benign lipomatous neoplasm, unspecified: Secondary | ICD-10-CM

## 2020-09-08 DIAGNOSIS — Z85828 Personal history of other malignant neoplasm of skin: Secondary | ICD-10-CM | POA: Diagnosis not present

## 2020-09-08 NOTE — Telephone Encounter (Signed)
[facility]  FINAL MICROSCOPIC DIAGNOSIS:   A. SOFT TISSUE, LIPOMA, EXCISION:  - Angiolipoma.   Reassured it is a benign process.   Incision is healing without issues.    Curlene Labrum, MD [facility] [address] [place], [place] [address] [phone] (office)

## 2020-09-10 ENCOUNTER — Ambulatory Visit (INDEPENDENT_AMBULATORY_CARE_PROVIDER_SITE_OTHER): Payer: Medicare Other | Admitting: Internal Medicine

## 2020-09-10 ENCOUNTER — Other Ambulatory Visit: Payer: Self-pay

## 2020-09-10 ENCOUNTER — Encounter (INDEPENDENT_AMBULATORY_CARE_PROVIDER_SITE_OTHER): Payer: Self-pay | Admitting: Internal Medicine

## 2020-09-10 VITALS — BP 126/72 | HR 49 | Temp 98.1°F | Ht 61.0 in | Wt 203.4 lb

## 2020-09-10 DIAGNOSIS — M5416 Radiculopathy, lumbar region: Secondary | ICD-10-CM

## 2020-09-10 DIAGNOSIS — E669 Obesity, unspecified: Secondary | ICD-10-CM

## 2020-09-10 DIAGNOSIS — E559 Vitamin D deficiency, unspecified: Secondary | ICD-10-CM

## 2020-09-10 DIAGNOSIS — E039 Hypothyroidism, unspecified: Secondary | ICD-10-CM

## 2020-09-10 MED ORDER — NP THYROID 60 MG PO TABS
60.0000 mg | ORAL_TABLET | Freq: Every day | ORAL | 3 refills | Status: DC
Start: 2020-09-10 — End: 2020-12-09

## 2020-09-10 MED ORDER — SAXENDA 18 MG/3ML ~~LOC~~ SOPN
0.6000 mg | PEN_INJECTOR | Freq: Every day | SUBCUTANEOUS | 1 refills | Status: DC
Start: 2020-09-10 — End: 2020-10-15

## 2020-09-10 NOTE — Progress Notes (Signed)
Metrics: Intervention Frequency ACO  Documented Smoking Status Yearly  Screened one or more times in 24 months  Cessation Counseling or  Active cessation medication Past 24 months  Past 24 months   Guideline developer: UpToDate (See UpToDate for funding source) Date Released: 2014       Wellness Office Visit  Subjective:  Patient ID: Annette Saunders, female    DOB: 1946/08/12  Age: 75 y.o. MRN: [id number]  CC: This lady comes in for follow-up of hypothyroidism, back pain and radiculopathy, obesity, vitamin D deficiency. HPI Thankfully, her back pain is much improved with the use of Flexeril. She continues on desiccated NP thyroid. She wonders whether we could increase the dose as she feels that she has still less energy. She is also concerned about her obesity and wonders if there is anything we can do about this.  Past Medical History:  Diagnosis Date  . Anxiety   . Essential hypertension, benign 06/06/2019  . Generalized osteoarthritis   . GERD (gastroesophageal reflux disease)   . Hyperlipidemia   . Hypothyroidism, adult 06/06/2019  . Insomnia   . Malaise and fatigue 06/06/2019  . Obesity (BMI 30.0-34.9) 06/06/2019   Past Surgical History:  Procedure Laterality Date  . COLONOSCOPY  10/2014   Dr. Cristine Polio, outside hospital. Colonoscopy performed with Propofol, no polyps, small to medium size internal hemorrhoids noted.   [name] ECTOPIC PREGNANCY SURGERY    . ELBOW FRACTURE SURGERY    . HIATAL HERNIA REPAIR    . JOINT REPLACEMENT     bilateral knee  . LIPOMA EXCISION Right 08/26/2020   Procedure: MINOR EXCISION LIPOMA;THIGH;  Surgeon: Virl Cagey, MD;  Location: [facility];  Service: General;  Laterality: Right;  . TONSILLECTOMY       Family History  Problem Relation Age of Onset  . Colon cancer Neg Hx     Social History   Social History Narrative   Widow for 55 years-husband died of MI.Lives alone.   Social History   Tobacco Use  . Smoking status: Never Smoker  .  Smokeless tobacco: Never Used  Substance Use Topics  . Alcohol use: No    Alcohol/week: 0.0 standard drinks    Current Meds  Medication Sig  . Ascorbic Acid (VITAMIN C) 1000 MG tablet Take 1,000 mg by mouth daily.  [name] augmented betamethasone dipropionate (DIPROLENE-AF) 0.05 % cream Apply topically.  . Cholecalciferol (VITAMIN D) 50 MCG (2000 UT) tablet Take 2,000 Units by mouth daily.   . clindamycin (CLEOCIN T) 1 % lotion Apply topically 2 (two) times daily as needed.  . cyclobenzaprine (FLEXERIL) 5 MG tablet TAKE 1 TABLET(5 MG) BY MOUTH THREE TIMES DAILY AS NEEDED FOR MUSCLE SPASMS  . dicyclomine (BENTYL) 10 MG capsule Take 1 capsule (10 mg total) by mouth 4 (four) times daily -  before meals and at bedtime. For cramping as needed  . ELDERBERRY PO Take 1 tablet by mouth daily.  [name] escitalopram (LEXAPRO) 10 MG tablet TAKE 1 TABLET BY MOUTH ONCE DAILY  . esomeprazole (NEXIUM) 40 MG capsule Take 1 capsule (40 mg total) by mouth daily. 30 minutes before meal  . gabapentin (NEURONTIN) 300 MG capsule Take 1 capsule (300 mg total) by mouth 3 (three) times daily.  . Liraglutide -Weight Management (SAXENDA) 18 MG/3ML SOPN Inject 0.6 mg into the skin daily.  . meloxicam (MOBIC) 7.5 MG tablet Take 1 tablet (7.5 mg total) by mouth daily.  . NP THYROID 120 MG tablet TAKE 1 TABLET(120 MG) BY MOUTH  TWICE DAILY  . NP THYROID 60 MG tablet Take 1 tablet (60 mg total) by mouth daily before breakfast.  . progesterone (PROMETRIUM) 200 MG capsule TAKE 3 CAPSULES BY MOUTH EVERY NIGHT  . zinc gluconate 50 MG tablet Take 50 mg by mouth daily.  . [DISCONTINUED] predniSONE (DELTASONE) 20 MG tablet Take 2 tablets (40 mg total) by mouth daily with breakfast.      Depression screen [facility] 2/9 09/10/2020 11/06/2019 10/16/2019 10/16/2019 06/06/2019  Decreased Interest 0 0 0 0 0  Down, Depressed, Hopeless 0 0 0 0 0  PHQ - 2 Score 0 0 0 0 0  Altered sleeping 0 - - - -  Tired, decreased energy 0 - - - -  Change in appetite  0 - - - -  Feeling bad or failure about yourself  0 - - - -  Trouble concentrating 0 - - - -  Moving slowly or fidgety/restless 0 - - - -  Suicidal thoughts 0 - - - -  PHQ-9 Score 0 - - - -  Difficult doing work/chores Not difficult at all - - - -     Objective:   Today's Vitals: BP 126/72   Pulse (!) 49   Temp 98.1 F (36.7 C) (Temporal)   Ht 5\' 1"  (1.549 m)   Wt 203 lb 6.4 oz (92.3 kg)   SpO2 98%   BMI 38.43 kg/m  Vitals with BMI 09/10/2020 08/18/2020 08/18/2020  Height 5\' 1"  5\' 1"  5\' 1"   Weight 203 lbs 6 oz 200 lbs 200 lbs  BMI 38.45 25.00 37.04  Systolic 888 916 945  Diastolic 72 76 87  Pulse 49 41 110     Physical Exam  She remains obese and she has gained some weight since the last time she was seen. Blood pressure is excellent.     Assessment   1. Hypothyroidism, adult   2. Lumbar radiculopathy   3. Vitamin D deficiency   4. Obesity (BMI 30-39.9)       Tests ordered No orders of the defined types were placed in this encounter.    Plan: 1. I think we can further optimize her thyroid so she will take NP thyroid 120 mg and then another NP thyroid 60 mg later in the day. I have sent a new prescription to reflect this change. 2. As far as her obesity is concerned we can try her on Saxenda and hopefully will be approved by her insurance company. She also really needs to be consistent with nutrition as well and she is aware of this. 3. Follow-up in about 6 weeks to see how she is doing.   Meds ordered this encounter  Medications  . Liraglutide -Weight Management (SAXENDA) 18 MG/3ML SOPN    Sig: Inject 0.6 mg into the skin daily.    Dispense:  3 mL    Refill:  1  . NP THYROID 60 MG tablet    Sig: Take 1 tablet (60 mg total) by mouth daily before breakfast.    Dispense:  30 tablet    Refill:  3    Kwadwo Taras Luther Parody, MD

## 2020-09-16 ENCOUNTER — Ambulatory Visit (HOSPITAL_COMMUNITY): Payer: Medicare Other

## 2020-09-18 ENCOUNTER — Ambulatory Visit: Payer: Medicare Other | Admitting: Gastroenterology

## 2020-09-21 ENCOUNTER — Other Ambulatory Visit: Payer: Self-pay

## 2020-09-21 ENCOUNTER — Ambulatory Visit (INDEPENDENT_AMBULATORY_CARE_PROVIDER_SITE_OTHER): Payer: Medicare Other | Admitting: Internal Medicine

## 2020-09-21 ENCOUNTER — Telehealth (INDEPENDENT_AMBULATORY_CARE_PROVIDER_SITE_OTHER): Payer: Self-pay

## 2020-09-21 ENCOUNTER — Encounter (INDEPENDENT_AMBULATORY_CARE_PROVIDER_SITE_OTHER): Payer: Self-pay | Admitting: Internal Medicine

## 2020-09-21 VITALS — BP 128/78 | HR 102 | Temp 98.2°F | Ht 61.0 in | Wt 199.6 lb

## 2020-09-21 DIAGNOSIS — R1031 Right lower quadrant pain: Secondary | ICD-10-CM | POA: Diagnosis not present

## 2020-09-21 DIAGNOSIS — M5442 Lumbago with sciatica, left side: Secondary | ICD-10-CM

## 2020-09-21 MED ORDER — DEXAMETHASONE SODIUM PHOSPHATE 4 MG/ML IJ SOLN
4.0000 mg | Freq: Once | INTRAMUSCULAR | Status: AC
Start: 2020-09-21 — End: 2020-09-21
  Administered 2020-09-21: 4 mg via INTRAMUSCULAR

## 2020-09-21 MED ORDER — CYCLOBENZAPRINE HCL 10 MG PO TABS: 10.0000 mg | ORAL_TABLET | Freq: Three times a day (TID) | ORAL | 1 refills | Status: DC | PRN

## 2020-09-21 MED ORDER — THYROID 120 MG PO TABS: 120.0000 mg | ORAL_TABLET | Freq: Two times a day (BID) | ORAL | 3 refills | Status: DC

## 2020-09-21 NOTE — Telephone Encounter (Signed)
Patient called and she stated that she has not been able to get the following medication yet:  Liraglutide -Weight Management (Christian) 18 MG/3ML SOPN   Patient thought that there was paperwork to fill out for this?  Please advise.

## 2020-09-21 NOTE — Telephone Encounter (Signed)
Nellie, can you look into this?  I am sure it will need preauthorization.

## 2020-09-21 NOTE — Progress Notes (Signed)
Metrics: Intervention Frequency ACO  Documented Smoking Status Yearly  Screened one or more times in 24 months  Cessation Counseling or  Active cessation medication Past 24 months  Past 24 months   Guideline developer: UpToDate (See UpToDate for funding source) Date Released: 2014       Wellness Office Visit  Subjective:  Patient ID: Annette Saunders, female    DOB: 1946/05/06  Age: 75 y.o. MRN: [id number]  CC: Right groin sprain. HPI  This patient comes in for an acute visit.  Couple of days ago she was moving her bed by herself and sprained her right groin area.  She is able to walk but it is very painful and tender.  She has been using cyclobenzaprine as a muscle relaxant at the dose of 5 mg but this does not seem to help her. She also had questions regarding her NP thyroid.  She has been taking NP thyroid 120 mg twice a day and I also prescribed NP thyroid 60 mg tablet.  This was my confusion.  I thought she was taking NP thyroid 60 mg in the morning.  However, she has been tolerant of these medications without any problems in the past. Past Medical History:  Diagnosis Date  . Anxiety   . Essential hypertension, benign 06/06/2019  . Generalized osteoarthritis   . GERD (gastroesophageal reflux disease)   . Hyperlipidemia   . Hypothyroidism, adult 06/06/2019  . Insomnia   . Malaise and fatigue 06/06/2019  . Obesity (BMI 30.0-34.9) 06/06/2019   Past Surgical History:  Procedure Laterality Date  . COLONOSCOPY  10/2014   Dr. Cristine Polio, outside hospital. Colonoscopy performed with Propofol, no polyps, small to medium size internal hemorrhoids noted.   [name] ECTOPIC PREGNANCY SURGERY    . ELBOW FRACTURE SURGERY    . HIATAL HERNIA REPAIR    . JOINT REPLACEMENT     bilateral knee  . LIPOMA EXCISION Right 08/26/2020   Procedure: MINOR EXCISION LIPOMA;THIGH;  Surgeon: Virl Cagey, MD;  Location: [facility];  Service: General;  Laterality: Right;  . TONSILLECTOMY       Family History   Problem Relation Age of Onset  . Colon cancer Neg Hx     Social History   Social History Narrative   Widow for 42 years-husband died of MI.Lives alone.   Social History   Tobacco Use  . Smoking status: Never Smoker  . Smokeless tobacco: Never Used  Substance Use Topics  . Alcohol use: No    Alcohol/week: 0.0 standard drinks    Current Meds  Medication Sig  . Ascorbic Acid (VITAMIN C) 1000 MG tablet Take 1,000 mg by mouth daily.  [name] augmented betamethasone dipropionate (DIPROLENE-AF) 0.05 % cream Apply topically.  . Cholecalciferol (VITAMIN D) 50 MCG (2000 UT) tablet Take 2,000 Units by mouth daily.   . clindamycin (CLEOCIN T) 1 % lotion Apply topically 2 (two) times daily as needed.  . dicyclomine (BENTYL) 10 MG capsule Take 1 capsule (10 mg total) by mouth 4 (four) times daily -  before meals and at bedtime. For cramping as needed  . ELDERBERRY PO Take 1 tablet by mouth daily.  [name] escitalopram (LEXAPRO) 10 MG tablet TAKE 1 TABLET BY MOUTH ONCE DAILY  . esomeprazole (NEXIUM) 40 MG capsule Take 1 capsule (40 mg total) by mouth daily. 30 minutes before meal  . gabapentin (NEURONTIN) 300 MG capsule Take 1 capsule (300 mg total) by mouth 3 (three) times daily.  . meloxicam (MOBIC) 7.5 MG tablet  Take 1 tablet (7.5 mg total) by mouth daily.  . NP THYROID 60 MG tablet Take 1 tablet (60 mg total) by mouth daily before breakfast.  . progesterone (PROMETRIUM) 200 MG capsule TAKE 3 CAPSULES BY MOUTH EVERY NIGHT  . zinc gluconate 50 MG tablet Take 50 mg by mouth daily.  . [DISCONTINUED] cyclobenzaprine (FLEXERIL) 5 MG tablet TAKE 1 TABLET(5 MG) BY MOUTH THREE TIMES DAILY AS NEEDED FOR MUSCLE SPASMS  . [DISCONTINUED] NP THYROID 120 MG tablet TAKE 1 TABLET(120 MG) BY MOUTH TWICE DAILY      Depression screen [facility] 2/9 09/10/2020 11/06/2019 10/16/2019 10/16/2019 06/06/2019  Decreased Interest 0 0 0 0 0  Down, Depressed, Hopeless 0 0 0 0 0  PHQ - 2 Score 0 0 0 0 0  Altered sleeping 0 - - - -   Tired, decreased energy 0 - - - -  Change in appetite 0 - - - -  Feeling bad or failure about yourself  0 - - - -  Trouble concentrating 0 - - - -  Moving slowly or fidgety/restless 0 - - - -  Suicidal thoughts 0 - - - -  PHQ-9 Score 0 - - - -  Difficult doing work/chores Not difficult at all - - - -     Objective:   Today's Vitals: BP 128/78   Pulse (!) 102   Temp 98.2 F (36.8 C) (Temporal)   Ht 5\' 1"  (1.549 m)   Wt 199 lb 9.6 oz (90.5 kg)   SpO2 98%   BMI 37.71 kg/m  Vitals with BMI 09/21/2020 09/10/2020 08/18/2020  Height 5\' 1"  5\' 1"  5\' 1"   Weight 199 lbs 10 oz 203 lbs 6 oz 200 lbs  BMI 37.73 02.58 52.77  Systolic 824 235 361  Diastolic 78 72 76  Pulse 443 49 41     Physical Exam    She looks systemically well.  She has lost about 4 pounds since the last visit.  She is in mild pain in the right groin area.   Assessment   1. Right inguinal pain   2. Acute left-sided low back pain with left-sided sciatica       Tests ordered No orders of the defined types were placed in this encounter.    Plan: 1. She has been given dexamethasone 4 mg intramuscular in the office today to help anti-inflammatory effects of the right groin sprain and I have given her a new prescription with a higher dose of cyclobenzaprine and hopefully she will tolerate this. 2. As far as the thyroid is concerned, she is willing to try NP thyroid 120 mg twice a day with NP thyroid 60 mg tablet in the middle of the day to see if she will tolerate this and optimize her thyroid and decrease insulin resistance.  We are working on trying to get approval for [place] with [employer]. 3. Follow-up is scheduled.   Meds ordered this encounter  Medications  . thyroid (NP THYROID) 120 MG tablet    Sig: Take 1 tablet (120 mg total) by mouth in the morning and at bedtime.    Dispense:  60 tablet    Refill:  3    ZERO refills remain on this prescription. Your patient is requesting advance  approval of refills for this medication to [facility]  . cyclobenzaprine (FLEXERIL) 10 MG tablet    Sig: Take 1 tablet (10 mg total) by mouth 3 (three) times daily as needed for  muscle spasms.    Dispense:  30 tablet    Refill:  1    Micayla Brathwaite Luther Parody, MD

## 2020-09-21 NOTE — Addendum Note (Signed)
Addended by: Anibal Henderson on: 09/21/2020 04:28 PM   Modules accepted: Orders

## 2020-09-22 ENCOUNTER — Telehealth (INDEPENDENT_AMBULATORY_CARE_PROVIDER_SITE_OTHER): Payer: Self-pay | Admitting: Internal Medicine

## 2020-09-22 ENCOUNTER — Encounter (HOSPITAL_COMMUNITY): Payer: Self-pay

## 2020-09-22 ENCOUNTER — Emergency Department (HOSPITAL_COMMUNITY)
Admission: EM | Admit: 2020-09-22 | Discharge: 2020-09-22 | Disposition: A | Discharge status: Home / Self Care | Payer: Medicare Other | Attending: Emergency Medicine | Admitting: Emergency Medicine

## 2020-09-22 ENCOUNTER — Other Ambulatory Visit: Payer: Self-pay

## 2020-09-22 DIAGNOSIS — Z96653 Presence of artificial knee joint, bilateral: Secondary | ICD-10-CM | POA: Insufficient documentation

## 2020-09-22 DIAGNOSIS — Z79899 Other long term (current) drug therapy: Secondary | ICD-10-CM | POA: Insufficient documentation

## 2020-09-22 DIAGNOSIS — R1030 Lower abdominal pain, unspecified: Secondary | ICD-10-CM | POA: Diagnosis present

## 2020-09-22 DIAGNOSIS — E039 Hypothyroidism, unspecified: Secondary | ICD-10-CM | POA: Diagnosis not present

## 2020-09-22 DIAGNOSIS — I10 Essential (primary) hypertension: Secondary | ICD-10-CM | POA: Insufficient documentation

## 2020-09-22 DIAGNOSIS — M5416 Radiculopathy, lumbar region: Secondary | ICD-10-CM | POA: Diagnosis not present

## 2020-09-22 MED ORDER — HYDROMORPHONE HCL 2 MG/ML IJ SOLN
2.0000 mg | Freq: Once | INTRAMUSCULAR | Status: AC
  Administered 2020-09-22: 2 mg via INTRAMUSCULAR
  Filled 2020-09-22: qty 1

## 2020-09-22 MED ORDER — HYDROCODONE-ACETAMINOPHEN 5-325 MG PO TABS: 2.0000 | ORAL_TABLET | ORAL | 0 refills | Status: DC | PRN

## 2020-09-22 MED ORDER — PREDNISONE 20 MG PO TABS: 20.0000 mg | ORAL_TABLET | Freq: Two times a day (BID) | ORAL | 0 refills | Status: DC

## 2020-09-22 NOTE — Telephone Encounter (Signed)
Okay, thanks

## 2020-09-22 NOTE — ED Triage Notes (Signed)
Presents for complaints of right groin pain that began Saturday 09/19/20 but got worse last night. Reports she moved her bed which is what exacerbated the pain. Also vomited 3 times last night.

## 2020-09-22 NOTE — ED Triage Notes (Signed)
Also repots got an IM injection yesterday for pain from PCP. Unknown of exactly what she was given

## 2020-09-22 NOTE — ED Provider Notes (Signed)
[facility] EMERGENCY DEPARTMENT Provider Note   CSN: [id number] Arrival date & time: 09/22/20  7062     History Chief Complaint  Patient presents with  . Groin Pain    Right sided groin pain   . Emesis    Annette Saunders is a 75 y.o. female.  HPI Patient here for evaluation of right-sided groin pain.  She saw her PCP yesterday and was diagnosed with musculoskeletal discomfort and treated with IM dexamethasone, with increased dosing of cyclobenzaprine.  It was felt that she might have muscle strain of the groin, as well as sciatica, right-sided.  She reports onset of this problem several days ago after moving some furniture.  She had some vomiting during the night, last night but otherwise has been eating well.  She denies fever, chills, following, numbness or weakness.  She does not have chronic ongoing back pain.  There are no other known modifying factors.    Past Medical History:  Diagnosis Date  . Anxiety   . Essential hypertension, benign 06/06/2019  . Generalized osteoarthritis   . GERD (gastroesophageal reflux disease)   . Hyperlipidemia   . Hypothyroidism, adult 06/06/2019  . Insomnia   . Malaise and fatigue 06/06/2019  . Obesity (BMI 30.0-34.9) 06/06/2019    Patient Active Problem List   Diagnosis Date Noted  . Benign lipomatous neoplasm of skin, subcu of right leg   . Lipoma 08/18/2020  . Fatigue 06/09/2020  . Vitamin D deficiency 06/09/2020  . Epigastric pain   . Non-intractable vomiting   . SBO (small bowel obstruction) ([place])   . Class 2 obesity due to excess calories with body mass index (BMI) of 37.0 to 37.9 in adult   . Ileus ([place]) 10/31/2019  . Shortness of breath 10/16/2019  . Encounter to discuss test results 10/16/2019  . Renal insufficiency 10/16/2019  . Healthcare maintenance 10/16/2019  . Otitis media 10/16/2019  . Breast pain 10/16/2019  . Abdominal pain 09/17/2019  . Essential hypertension, benign 06/06/2019  . Malaise and fatigue 06/06/2019   . Obesity (BMI 30.0-34.9) 06/06/2019  . Hypothyroidism, adult 06/06/2019  . Diarrhea 03/28/2019  . Pain due to total right knee replacement ([place]) 05/21/2018  . Dysphagia 12/14/2017  . GERD (gastroesophageal reflux disease) 10/27/2016    Past Surgical History:  Procedure Laterality Date  . COLONOSCOPY  10/2014   Dr. Cristine Polio, outside hospital. Colonoscopy performed with Propofol, no polyps, small to medium size internal hemorrhoids noted.   [name] ECTOPIC PREGNANCY SURGERY    . ELBOW FRACTURE SURGERY    . HIATAL HERNIA REPAIR    . JOINT REPLACEMENT     bilateral knee  . LIPOMA EXCISION Right 08/26/2020   Procedure: MINOR EXCISION LIPOMA;THIGH;  Surgeon: Virl Cagey, MD;  Location: [facility];  Service: General;  Laterality: Right;  . TONSILLECTOMY       OB History   No obstetric history on file.     Family History  Problem Relation Age of Onset  . Colon cancer Neg Hx     Social History   Tobacco Use  . Smoking status: Never Smoker  . Smokeless tobacco: Never Used  Substance Use Topics  . Alcohol use: No    Alcohol/week: 0.0 standard drinks  . Drug use: No    Home Medications Prior to Admission medications   Medication Sig Start Date End Date Taking? Authorizing Provider  HYDROcodone-acetaminophen (NORCO/VICODIN) 5-325 MG tablet Take 2 tablets by mouth every 4 (four) hours as needed. 09/22/20  Yes Eulis Foster,  Vira Agar, MD  predniSONE (DELTASONE) 20 MG tablet Take 1 tablet (20 mg total) by mouth 2 (two) times daily. 09/22/20  Yes Daleen Bo, MD  Ascorbic Acid (VITAMIN C) 1000 MG tablet Take 1,000 mg by mouth daily.    [provider]  augmented betamethasone dipropionate (DIPROLENE-AF) 0.05 % cream Apply topically. 04/07/20   [provider]  Cholecalciferol (VITAMIN D) 50 MCG (2000 UT) tablet Take 2,000 Units by mouth daily.     [provider]  clindamycin (CLEOCIN T) 1 % lotion Apply topically 2 (two) times daily as needed. 02/27/20   [provider]  cyclobenzaprine (FLEXERIL) 10 MG tablet Take 1 tablet (10 mg total) by mouth 3 (three) times daily as needed for muscle spasms. 09/21/20   Doree Albee, MD  dicyclomine (BENTYL) 10 MG capsule Take 1 capsule (10 mg total) by mouth 4 (four) times daily -  before meals and at bedtime. For cramping as needed 06/19/20   Annitta Needs, NP  ELDERBERRY PO Take 1 tablet by mouth daily.    [provider]  escitalopram (LEXAPRO) 10 MG tablet TAKE 1 TABLET BY MOUTH ONCE DAILY 06/22/20   Hurshel Party C, MD  esomeprazole (NEXIUM) 40 MG capsule Take 1 capsule (40 mg total) by mouth daily. 30 minutes before meal 03/24/20   Annitta Needs, NP  gabapentin (NEURONTIN) 300 MG capsule Take 1 capsule (300 mg total) by mouth 3 (three) times daily. 08/13/20   Doree Albee, MD  Liraglutide -Weight Management (SAXENDA) 18 MG/3ML SOPN Inject 0.6 mg into the skin daily. Patient not taking: Reported on 09/21/2020 09/10/20   Doree Albee, MD  meloxicam (MOBIC) 7.5 MG tablet Take 1 tablet (7.5 mg total) by mouth daily. 08/05/20   Ailene Ards, NP  NP THYROID 60 MG tablet Take 1 tablet (60 mg total) by mouth daily before breakfast. 09/10/20   Hurshel Party C, MD  progesterone (PROMETRIUM) 200 MG capsule TAKE 3 CAPSULES BY MOUTH EVERY NIGHT 08/25/20   Doree Albee, MD  thyroid (NP THYROID) 120 MG tablet Take 1 tablet (120 mg total) by mouth in the morning and at bedtime. 09/21/20   Doree Albee, MD  zinc gluconate 50 MG tablet Take 50 mg by mouth daily.    [provider]    Allergies    Dilantin [phenytoin sodium extended] and Doxycycline hyclate  Review of Systems   Review of Systems  All other systems reviewed and are negative.   Physical Exam Updated Vital Signs BP (!) 175/99 (BP Location: Right Arm)   Pulse 93   Temp 98.1 F (36.7 C) (Oral)   Resp 16   Ht 5\' 1"  (1.549 m)   Wt 90.3 kg   SpO2 100%   BMI 37.60 kg/m   Physical Exam Vitals and nursing  note reviewed.  Constitutional:      General: She is in acute distress (Uncomfortable, standing up leaning against the counter).     Appearance: She is well-developed and well-nourished. She is obese. She is not ill-appearing, toxic-appearing or diaphoretic.  HENT:     Head: Normocephalic and atraumatic.     Right Ear: External ear normal.     Left Ear: External ear normal.  Eyes:     Extraocular Movements: EOM normal.     Conjunctiva/sclera: Conjunctivae normal.     Pupils: Pupils are equal, round, and reactive to light.  Neck:     Trachea: Phonation normal.  Cardiovascular:  Rate and Rhythm: Normal rate.  Pulmonary:     Effort: Pulmonary effort is normal.  Chest:     Chest wall: No bony tenderness.  Abdominal:     General: There is no distension.     Palpations: Abdomen is soft.     Tenderness: There is no abdominal tenderness.  Musculoskeletal:        General: Normal range of motion.     Cervical back: Normal range of motion and neck supple.     Comments: Slight right-sided limp with walking.  Skin:    General: Skin is warm, dry and intact.  Neurological:     Mental Status: She is alert and oriented to person, place, and time.     Cranial Nerves: No cranial nerve deficit.     Sensory: No sensory deficit.     Motor: No abnormal muscle tone.     Coordination: Coordination normal.  Psychiatric:        Mood and Affect: Mood and affect and mood normal.        Behavior: Behavior normal.        Thought Content: Thought content normal.        Judgment: Judgment normal.     ED Results / Procedures / Treatments   Labs (all labs ordered are listed, but only abnormal results are displayed) Labs Reviewed - No data to display  EKG None  Radiology No results found.  Procedures Procedures   Medications Ordered in ED Medications  HYDROmorphone (DILAUDID) injection 2 mg (2 mg Intramuscular Given 09/22/20 1152)    ED Course  I have reviewed the triage vital signs and  the nursing notes.  Pertinent labs & imaging results that were available during my care of the patient were reviewed by me and considered in my medical decision making (see chart for details).    MDM Rules/Calculators/A&P                           Patient Vitals for the past 24 hrs:  BP Temp Temp src Pulse Resp SpO2 Height Weight  09/22/20 0943 -- -- -- -- -- -- 5\' 1"  (1.549 m) 90.3 kg  09/22/20 0942 (!) 175/99 98.1 F (36.7 C) Oral 93 16 100 % -- --    1:32 PM Reevaluation with update and discussion. After initial assessment and treatment, an updated evaluation reveals she is more comfortable now able to move the right leg without as much discomfort.  She does not have any other complaints.  Findings discussed with the patient and all questions were answered. Daleen Bo   Medical Decision Making:  This patient is presenting for evaluation of right groin pain, onset after moving several days ago and persistent despite interventions by PCP and patient, which does require a range of treatment options, and is a complaint that involves a moderate risk of morbidity and mortality. The differential diagnoses include muscle strain, radiculopathy, sciatica. I decided to review old records, and in summary elderly female presenting for evaluation of painful condition after moving furniture.  I did not require additional historical information from anyone.    Critical Interventions-medical evaluation, medication treatment, observation reassessment  After These Interventions, the Patient was reevaluated and was found stable for discharge.  Patient with signs and symptoms of lumbar radiculopathy.  Doubt cauda equina syndrome.  Doubt acute intra-abdominal process, metabolic disorder or impending vascular collapse.  CRITICAL CARE-no Performed by: Daleen Bo  Nursing Notes Reviewed/ Care  Coordinated Applicable Imaging Reviewed Interpretation of Laboratory Data incorporated into ED  treatment  The patient appears reasonably screened and/or stabilized for discharge and I doubt any other medical condition or other [facility] requiring further screening, evaluation, or treatment in the ED at this time prior to discharge.  Plan: Home Medications-continue usual medications; Home Treatments-heat to affected area, gradual increase activity; return here if the recommended treatment, does not improve the symptoms; Recommended follow up-PCP, as needed     Final Clinical Impression(s) / ED Diagnoses Final diagnoses:  Lumbar radiculopathy    Rx / DC Orders ED Discharge Orders         Ordered    HYDROcodone-acetaminophen (NORCO/VICODIN) 5-325 MG tablet  Every 4 hours PRN        09/22/20 1330    predniSONE (DELTASONE) 20 MG tablet  2 times daily        09/22/20 1330           Daleen Bo, MD 09/22/20 1333

## 2020-09-22 NOTE — Discharge Instructions (Addendum)
The pain you are having in your right groin is likely from the nerves of the lower back, because of compression.  We are giving you a prescription for prednisone, which is anti-inflammatory can help the discomfort.  We are also prescribing hydrocodone, narcotic pain reliever.  Do not drive or drink alcohol when you are taking this medication.  It is okay to continue taking the cyclobenzaprine, if needed for assistance with muscle relaxation.  Make sure you see your doctor if not better in a few days.  Also try using heat on the area to help the discomfort.

## 2020-09-24 ENCOUNTER — Telehealth: Payer: Self-pay

## 2020-09-24 ENCOUNTER — Other Ambulatory Visit (INDEPENDENT_AMBULATORY_CARE_PROVIDER_SITE_OTHER): Payer: Self-pay | Admitting: Internal Medicine

## 2020-09-24 DIAGNOSIS — F419 Anxiety disorder, unspecified: Secondary | ICD-10-CM

## 2020-09-24 NOTE — Telephone Encounter (Signed)
Transition Care Management Unsuccessful Follow-up Telephone Call  Date of discharge and from where:  09/23/2020 from [facility]  Attempts:  1st Attempt  Reason for unsuccessful TCM follow-up call:  Left voice message

## 2020-09-25 NOTE — Telephone Encounter (Signed)
Transition Care Management Unsuccessful Follow-up Telephone Call  Date of discharge and from where: 09/23/20 from [facility]  Attempts:  2nd Attempt  Reason for unsuccessful TCM follow-up call:  Left voice message

## 2020-09-28 NOTE — Telephone Encounter (Signed)
Transition Care Management Unsuccessful Follow-up Telephone Call  Date of discharge and from where:  09/23/20 from [place]  Attempts:  3rd Attempt  Reason for unsuccessful TCM follow-up call:  Unable to reach patient     

## 2020-09-29 ENCOUNTER — Telehealth (INDEPENDENT_AMBULATORY_CARE_PROVIDER_SITE_OTHER): Payer: Self-pay

## 2020-09-29 ENCOUNTER — Ambulatory Visit (INDEPENDENT_AMBULATORY_CARE_PROVIDER_SITE_OTHER): Payer: Medicare Other | Admitting: Internal Medicine

## 2020-09-29 ENCOUNTER — Other Ambulatory Visit (INDEPENDENT_AMBULATORY_CARE_PROVIDER_SITE_OTHER): Payer: Self-pay | Admitting: Internal Medicine

## 2020-09-29 ENCOUNTER — Encounter (INDEPENDENT_AMBULATORY_CARE_PROVIDER_SITE_OTHER): Payer: Self-pay | Admitting: Internal Medicine

## 2020-09-29 ENCOUNTER — Other Ambulatory Visit: Payer: Self-pay

## 2020-09-29 VITALS — BP 154/86 | HR 111 | Temp 97.3°F | Ht 61.0 in | Wt 201.0 lb

## 2020-09-29 DIAGNOSIS — R1031 Right lower quadrant pain: Secondary | ICD-10-CM | POA: Diagnosis not present

## 2020-09-29 DIAGNOSIS — E669 Obesity, unspecified: Secondary | ICD-10-CM | POA: Diagnosis not present

## 2020-09-29 DIAGNOSIS — R0982 Postnasal drip: Secondary | ICD-10-CM

## 2020-09-29 MED ORDER — TRAMADOL HCL 50 MG PO TABS: 50.0000 mg | ORAL_TABLET | Freq: Three times a day (TID) | ORAL | 0 refills | Status: DC | PRN

## 2020-09-29 NOTE — Telephone Encounter (Signed)
Okay, have sent a prescription for tramadol to [facility] on [address].

## 2020-09-29 NOTE — Telephone Encounter (Signed)
Patient called and stated that she forgot to ask you for something for the pain. She said it really hurts when she walks and needs something that will help her pain. Please send to [place].

## 2020-09-29 NOTE — Progress Notes (Signed)
Metrics: Intervention Frequency ACO  Documented Smoking Status Yearly  Screened one or more times in 24 months  Cessation Counseling or  Active cessation medication Past 24 months  Past 24 months   Guideline developer: UpToDate (See UpToDate for funding source) Date Released: 2014       Wellness Office Visit  Subjective:  Patient ID: Annette Saunders, female    DOB: [birth year]  Age: 75 y.o. MRN: [id number]  CC: Right groin pain, postnasal drainage. HPI  Today she is complaining of nasal drainage and postnasal drainage and slight sore throat which has been going on for about 2 weeks.  She has not had a COVID-19 test.  However she denies any fever, body aches, dyspnea. She also has had right groin pain but she thinks this is improving. Past Medical History:  Diagnosis Date  . Anxiety   . Essential hypertension, benign 06/06/2019  . Generalized osteoarthritis   . GERD (gastroesophageal reflux disease)   . Hyperlipidemia   . Hypothyroidism, adult 06/06/2019  . Insomnia   . Malaise and fatigue 06/06/2019  . Obesity (BMI 30.0-34.9) 06/06/2019   Past Surgical History:  Procedure Laterality Date  . COLONOSCOPY  10/2014   Dr. Cristine Polio, outside hospital. Colonoscopy performed with Propofol, no polyps, small to medium size internal hemorrhoids noted.   [name] ECTOPIC PREGNANCY SURGERY    . ELBOW FRACTURE SURGERY    . HIATAL HERNIA REPAIR    . JOINT REPLACEMENT     bilateral knee  . LIPOMA EXCISION Right 08/26/2020   Procedure: MINOR EXCISION LIPOMA;THIGH;  Surgeon: Virl Cagey, MD;  Location: [facility];  Service: General;  Laterality: Right;  . TONSILLECTOMY       Family History  Problem Relation Age of Onset  . Colon cancer Neg Hx     Social History   Social History Narrative   Widow for 3 years-husband died of MI.Lives alone.   Social History   Tobacco Use  . Smoking status: Never Smoker  . Smokeless tobacco: Never Used  Substance Use Topics  . Alcohol use: No     Alcohol/week: 0.0 standard drinks    Current Meds  Medication Sig  . Ascorbic Acid (VITAMIN C) 1000 MG tablet Take 1,000 mg by mouth daily.  [name] augmented betamethasone dipropionate (DIPROLENE-AF) 0.05 % cream Apply topically.  . Cholecalciferol (VITAMIN D) 50 MCG (2000 UT) tablet Take 2,000 Units by mouth daily.   . clindamycin (CLEOCIN T) 1 % lotion Apply topically 2 (two) times daily as needed.  . cyclobenzaprine (FLEXERIL) 10 MG tablet Take 1 tablet (10 mg total) by mouth 3 (three) times daily as needed for muscle spasms.  [name] dicyclomine (BENTYL) 10 MG capsule Take 1 capsule (10 mg total) by mouth 4 (four) times daily -  before meals and at bedtime. For cramping as needed  . ELDERBERRY PO Take 1 tablet by mouth daily.  [name] escitalopram (LEXAPRO) 10 MG tablet TAKE 1 TABLET BY MOUTH ONCE DAILY  . esomeprazole (NEXIUM) 40 MG capsule Take 1 capsule (40 mg total) by mouth daily. 30 minutes before meal  . gabapentin (NEURONTIN) 300 MG capsule Take 1 capsule (300 mg total) by mouth 3 (three) times daily.  [name] HYDROcodone-acetaminophen (NORCO/VICODIN) 5-325 MG tablet Take 2 tablets by mouth every 4 (four) hours as needed.  . meloxicam (MOBIC) 7.5 MG tablet Take 1 tablet (7.5 mg total) by mouth daily.  . NP THYROID 60 MG tablet Take 1 tablet (60 mg total) by mouth daily before breakfast.  .  predniSONE (DELTASONE) 20 MG tablet Take 1 tablet (20 mg total) by mouth 2 (two) times daily.  . progesterone (PROMETRIUM) 200 MG capsule TAKE 3 CAPSULES BY MOUTH EVERY NIGHT  . thyroid (NP THYROID) 120 MG tablet Take 1 tablet (120 mg total) by mouth in the morning and at bedtime.  [name] zinc gluconate 50 MG tablet Take 50 mg by mouth daily.      Depression screen [facility] 2/9 09/10/2020 11/06/2019 10/16/2019 10/16/2019 06/06/2019  Decreased Interest 0 0 0 0 0  Down, Depressed, Hopeless 0 0 0 0 0  PHQ - 2 Score 0 0 0 0 0  Altered sleeping 0 - - - -  Tired, decreased energy 0 - - - -  Change in appetite 0 - - - -  Feeling  bad or failure about yourself  0 - - - -  Trouble concentrating 0 - - - -  Moving slowly or fidgety/restless 0 - - - -  Suicidal thoughts 0 - - - -  PHQ-9 Score 0 - - - -  Difficult doing work/chores Not difficult at all - - - -     Objective:   Today's Vitals: BP (!) 154/86   Pulse (!) 111   Temp (!) 97.3 F (36.3 C) (Temporal)   Ht 5\' 1"  (1.549 m)   Wt 201 lb (91.2 kg)   SpO2 97%   BMI 37.98 kg/m  Vitals with BMI 09/29/2020 09/22/2020 09/22/2020  Height 5\' 1"  - 5\' 1"   Weight 201 lbs - 199 lbs  BMI 38 - 23.36  Systolic 122 449 -  Diastolic 86 85 -  Pulse 753 118 -     Physical Exam  She looks systemically well and does not appear to be in pain.  Her throat is mildly inflamed.     Assessment   1. Right inguinal pain   2. Post-nasal drip   3. Obesity (BMI 30.0-34.9)       Tests ordered No orders of the defined types were placed in this encounter.    Plan: 1. I think she probably has allergies with postnasal drainage and I have recommended Flonase nasal spray to start with. 2. As far as her right inguinal pain is concerned, it seems to be improving and I am sure the muscle sprain is improving.  If she is no better in about a month's time, I will refer to physical rehab medicine.   No orders of the defined types were placed in this encounter.   Doree Albee, MD

## 2020-10-08 ENCOUNTER — Other Ambulatory Visit (INDEPENDENT_AMBULATORY_CARE_PROVIDER_SITE_OTHER): Payer: Self-pay

## 2020-10-12 ENCOUNTER — Telehealth (INDEPENDENT_AMBULATORY_CARE_PROVIDER_SITE_OTHER): Payer: Self-pay | Admitting: Internal Medicine

## 2020-10-12 NOTE — Telephone Encounter (Signed)
Please schedule her again with Judson Roch this week to address this symptom.  Thanks.

## 2020-10-14 ENCOUNTER — Other Ambulatory Visit (INDEPENDENT_AMBULATORY_CARE_PROVIDER_SITE_OTHER): Payer: Self-pay | Admitting: Internal Medicine

## 2020-10-15 ENCOUNTER — Other Ambulatory Visit: Payer: Self-pay

## 2020-10-15 ENCOUNTER — Encounter (INDEPENDENT_AMBULATORY_CARE_PROVIDER_SITE_OTHER): Payer: Self-pay | Admitting: Internal Medicine

## 2020-10-15 ENCOUNTER — Ambulatory Visit (INDEPENDENT_AMBULATORY_CARE_PROVIDER_SITE_OTHER): Payer: Medicare Other | Admitting: Internal Medicine

## 2020-10-15 VITALS — BP 150/90 | HR 111 | Temp 97.1°F | Ht 61.0 in | Wt 202.4 lb

## 2020-10-15 DIAGNOSIS — R1031 Right lower quadrant pain: Secondary | ICD-10-CM

## 2020-10-15 NOTE — Progress Notes (Signed)
Metrics: Intervention Frequency ACO  Documented Smoking Status Yearly  Screened one or more times in 24 months  Cessation Counseling or  Active cessation medication Past 24 months  Past 24 months   Guideline developer: UpToDate (See UpToDate for funding source) Date Released: 2014       Wellness Office Visit  Subjective:  Patient ID: Annette Saunders, female    DOB: 1945-10-31  Age: 75 y.o. MRN: [id number]  CC: Right inguinal pain. HPI  This patient continues to have right inguinal pain, she presented with this pain approximately 1 month ago when she was moving her bed by herself and appeared to have a sprain in the right groin area.  Muscle relaxants have not really helped her and she says that now she gets the pain mostly at night.  She wonders whether she would benefit by doing some kind of imaging. Past Medical History:  Diagnosis Date  . Anxiety   . Essential hypertension, benign 06/06/2019  . Generalized osteoarthritis   . GERD (gastroesophageal reflux disease)   . Hyperlipidemia   . Hypothyroidism, adult 06/06/2019  . Insomnia   . Malaise and fatigue 06/06/2019  . Obesity (BMI 30.0-34.9) 06/06/2019   Past Surgical History:  Procedure Laterality Date  . COLONOSCOPY  10/2014   Dr. Cristine Polio, outside hospital. Colonoscopy performed with Propofol, no polyps, small to medium size internal hemorrhoids noted.   [name] ECTOPIC PREGNANCY SURGERY    . ELBOW FRACTURE SURGERY    . HIATAL HERNIA REPAIR    . JOINT REPLACEMENT     bilateral knee  . LIPOMA EXCISION Right 08/26/2020   Procedure: MINOR EXCISION LIPOMA;THIGH;  Surgeon: Virl Cagey, MD;  Location: [facility];  Service: General;  Laterality: Right;  . TONSILLECTOMY       Family History  Problem Relation Age of Onset  . Colon cancer Neg Hx     Social History   Social History Narrative   Widow for 62 years-husband died of MI.Lives alone.   Social History   Tobacco Use  . Smoking status: Never Smoker  . Smokeless  tobacco: Never Used  Substance Use Topics  . Alcohol use: No    Alcohol/week: 0.0 standard drinks    Current Meds  Medication Sig  . Ascorbic Acid (VITAMIN C) 1000 MG tablet Take 1,000 mg by mouth daily.  [name] augmented betamethasone dipropionate (DIPROLENE-AF) 0.05 % cream Apply topically.  . Cholecalciferol (VITAMIN D) 50 MCG (2000 UT) tablet Take 2,000 Units by mouth daily.   . clindamycin (CLEOCIN T) 1 % lotion Apply topically 2 (two) times daily as needed.  . cyclobenzaprine (FLEXERIL) 10 MG tablet Take 1 tablet (10 mg total) by mouth 3 (three) times daily as needed for muscle spasms.  [name] dicyclomine (BENTYL) 10 MG capsule Take 1 capsule (10 mg total) by mouth 4 (four) times daily -  before meals and at bedtime. For cramping as needed  . ELDERBERRY PO Take 1 tablet by mouth daily.  [name] escitalopram (LEXAPRO) 10 MG tablet TAKE 1 TABLET BY MOUTH ONCE DAILY  . esomeprazole (NEXIUM) 40 MG capsule Take 1 capsule (40 mg total) by mouth daily. 30 minutes before meal  . gabapentin (NEURONTIN) 300 MG capsule Take 1 capsule (300 mg total) by mouth 3 (three) times daily.  [name] HYDROcodone-acetaminophen (NORCO/VICODIN) 5-325 MG tablet Take 2 tablets by mouth every 4 (four) hours as needed.  . meloxicam (MOBIC) 7.5 MG tablet Take 1 tablet (7.5 mg total) by mouth daily.  . NP THYROID 120  MG tablet TAKE 1 TABLET(120 MG) BY MOUTH TWICE DAILY  . NP THYROID 60 MG tablet Take 1 tablet (60 mg total) by mouth daily before breakfast.  . progesterone (PROMETRIUM) 200 MG capsule TAKE 3 CAPSULES BY MOUTH EVERY NIGHT  . zinc gluconate 50 MG tablet Take 50 mg by mouth daily.  . [DISCONTINUED] Liraglutide -Weight Management (SAXENDA) 18 MG/3ML SOPN Inject 0.6 mg into the skin daily.  . [DISCONTINUED] predniSONE (DELTASONE) 20 MG tablet Take 1 tablet (20 mg total) by mouth 2 (two) times daily.     [place] Office Visit from 09/10/2020 in [facility]  PHQ-9 Total Score 0      Objective:   Today's  Vitals: BP (!) 150/90 (BP Location: Right Arm, Patient Position: Sitting, Cuff Size: Large)   Pulse (!) 111   Temp (!) 97.1 F (36.2 C) (Temporal)   Ht 5\' 1"  (1.549 m)   Wt 202 lb 6.4 oz (91.8 kg)   SpO2 96%   BMI 38.24 kg/m  Vitals with BMI 10/15/2020 09/29/2020 09/22/2020  Height 5\' 1"  5\' 1"  -  Weight 202 lbs 6 oz 201 lbs -  BMI 60.10 38 -  Systolic 932 355 732  Diastolic 90 86 85  Pulse 202 111 118     Physical Exam   No new changes in physical findings.    Assessment   1. Right inguinal pain       Tests ordered Orders Placed This Encounter  Procedures  . US Pelvis Complete     Plan: 1. I am going to arrange for her to have a pelvic ultrasound to see if we see any abnormalities.  I did offer her to send her to physical medicine rehab for further evaluation and treatment but she is not keen to do that at the present time.  Let us see what the pelvic ultrasound shows first.   No orders of the defined types were placed in this encounter.   Doree Albee, MD

## 2020-10-21 ENCOUNTER — Telehealth (INDEPENDENT_AMBULATORY_CARE_PROVIDER_SITE_OTHER): Payer: Self-pay

## 2020-10-21 NOTE — Telephone Encounter (Signed)
-----   Message from Alfonzo Beers sent at 10/21/2020  1:28 PM EST ----- Regarding: Calll Back Jaclyn called and LVM and wants you to call her back when you can.

## 2020-10-22 ENCOUNTER — Telehealth (INDEPENDENT_AMBULATORY_CARE_PROVIDER_SITE_OTHER): Payer: Self-pay

## 2020-10-22 ENCOUNTER — Other Ambulatory Visit (INDEPENDENT_AMBULATORY_CARE_PROVIDER_SITE_OTHER): Payer: Self-pay | Admitting: Nurse Practitioner

## 2020-10-22 ENCOUNTER — Other Ambulatory Visit (INDEPENDENT_AMBULATORY_CARE_PROVIDER_SITE_OTHER): Payer: Self-pay | Admitting: Internal Medicine

## 2020-10-22 ENCOUNTER — Ambulatory Visit (HOSPITAL_COMMUNITY): Admission: RE | Admit: 2020-10-22 | Payer: Medicare Other | Source: Ambulatory Visit

## 2020-10-22 DIAGNOSIS — M5442 Lumbago with sciatica, left side: Secondary | ICD-10-CM

## 2020-10-22 DIAGNOSIS — F419 Anxiety disorder, unspecified: Secondary | ICD-10-CM

## 2020-10-22 MED ORDER — RYBELSUS 3 MG PO TABS: 3.0000 mg | ORAL_TABLET | Freq: Every day | ORAL | 3 refills | Status: DC

## 2020-10-22 NOTE — Telephone Encounter (Signed)
Tell her I have sent a prescription for Rybelsus to [facility] on [address]. Hopefully the insurance company might pay for this but if it does not, she can try going on [email or web address] to get a coupon.

## 2020-10-22 NOTE — Telephone Encounter (Signed)
To pick up new Rx. She will take Rybelsus instead of  Saxenda.

## 2020-10-26 ENCOUNTER — Encounter (INDEPENDENT_AMBULATORY_CARE_PROVIDER_SITE_OTHER): Payer: Self-pay | Admitting: Internal Medicine

## 2020-10-26 NOTE — Telephone Encounter (Signed)
So due to insurance now she cannot afford the medications. Asked if she can take metformin Or something else. Pharmacy said to do the metformin.

## 2020-10-27 ENCOUNTER — Telehealth (INDEPENDENT_AMBULATORY_CARE_PROVIDER_SITE_OTHER): Payer: Self-pay

## 2020-10-27 ENCOUNTER — Ambulatory Visit (HOSPITAL_COMMUNITY)
Admission: RE | Admit: 2020-10-27 | Discharge: 2020-10-27 | Disposition: A | Discharge status: Home / Self Care | Payer: Medicare Other | Source: Ambulatory Visit | Attending: Internal Medicine | Admitting: Internal Medicine

## 2020-10-27 ENCOUNTER — Other Ambulatory Visit: Payer: Self-pay

## 2020-10-27 DIAGNOSIS — R1031 Right lower quadrant pain: Secondary | ICD-10-CM | POA: Insufficient documentation

## 2020-10-27 DIAGNOSIS — R1030 Lower abdominal pain, unspecified: Secondary | ICD-10-CM | POA: Diagnosis not present

## 2020-10-27 NOTE — Telephone Encounter (Signed)
Patient called and stated that her Rybelsus is just too expensive for her to purchase (over $200 per month) and she is asking if there is something else that she can take instead of this medication? I also called [facility] and the lady stated that they have never carried the generic form of Rybelsus.  Please advise.

## 2020-10-27 NOTE — Telephone Encounter (Signed)
Patient did ask about the Metformin per her pharmacist and she stated that she is worried about having loose bowel movements and not sure if this will happen.  Please advise.

## 2020-10-27 NOTE — Telephone Encounter (Signed)
Called patient and gave her the message. Patient verbalized an understanding and stated she will wait until her next office visit.

## 2020-10-27 NOTE — Telephone Encounter (Signed)
If she is worried about that,then I will not prescribe it for now. We can discuss all this when we see her in the office next time.

## 2020-10-27 NOTE — Telephone Encounter (Signed)
I had sent her a MyChart message regarding this. Rybelsus is going to be too expensive and there's no generic. I can Rx Metformin if she's willing to try this? Let me know, thanks.

## 2020-10-28 ENCOUNTER — Ambulatory Visit (INDEPENDENT_AMBULATORY_CARE_PROVIDER_SITE_OTHER): Payer: Medicare Other | Admitting: Internal Medicine

## 2020-10-28 MED ORDER — METFORMIN HCL 500 MG PO TABS
500.0000 mg | ORAL_TABLET | Freq: Two times a day (BID) | ORAL | 3 refills | Status: DC
Start: 2020-10-28 — End: 2020-11-03

## 2020-11-03 ENCOUNTER — Other Ambulatory Visit: Payer: Self-pay

## 2020-11-03 ENCOUNTER — Encounter: Payer: Self-pay | Admitting: *Deleted

## 2020-11-03 ENCOUNTER — Other Ambulatory Visit: Payer: Self-pay | Admitting: *Deleted

## 2020-11-03 ENCOUNTER — Encounter: Payer: Self-pay | Admitting: Gastroenterology

## 2020-11-03 ENCOUNTER — Telehealth: Payer: Self-pay | Admitting: *Deleted

## 2020-11-03 ENCOUNTER — Ambulatory Visit (INDEPENDENT_AMBULATORY_CARE_PROVIDER_SITE_OTHER): Payer: Medicare Other | Admitting: Gastroenterology

## 2020-11-03 VITALS — BP 155/82 | HR 104 | Temp 97.1°F | Ht 61.0 in | Wt 201.4 lb

## 2020-11-03 DIAGNOSIS — R1031 Right lower quadrant pain: Secondary | ICD-10-CM

## 2020-11-03 DIAGNOSIS — R1310 Dysphagia, unspecified: Secondary | ICD-10-CM

## 2020-11-03 DIAGNOSIS — K219 Gastro-esophageal reflux disease without esophagitis: Secondary | ICD-10-CM

## 2020-11-03 MED ORDER — ESOMEPRAZOLE MAGNESIUM 40 MG PO CPDR: 40.0000 mg | DELAYED_RELEASE_CAPSULE | Freq: Two times a day (BID) | ORAL | 3 refills | Status: DC

## 2020-11-03 MED ORDER — PEG 3350-KCL-NA BICARB-NACL 420 G PO SOLR: ORAL | 0 refills | Status: DC

## 2020-11-03 NOTE — Telephone Encounter (Signed)
Called pt. She has been scheduled for TCS/EGD/DIL with Dr. Abbey Chatters, ASA 3 on 4/12, am appt. Aware will need pre-op/covid test appt. Advised will mail with prep instructions.   Instructions for CT and lab orders mailed.

## 2020-11-03 NOTE — Progress Notes (Signed)
Referring Provider: Doree Albee, MD Primary Care Physician:  Doree Albee, MD Primary GI: Dr. Abbey Chatters  Chief Complaint  Patient presents with  . Abdominal Pain    X8 weeks, started right lower abd and goes across abd; has US pelvis 10/27/20  . Dysphagia  . burping    HPI:   Annette Saunders is a 75 y.o. female presenting today with a history of GERD, dysphagia, last EGD several years ago at outside facility but unable to obtain. Issues with dysphagia intermittently; BPEApril 2019 with prior fundoplication without recurrent hiatal hernia, otherwise normal.  She has had bilateral lower quadrant abdominal pain intermittently in the past. Bentyl BID. She is not sure if this is helping or not and will try without it. No association with bowel habits. CT Sept 2021 without explanation for symptoms. Now with RLQ discomfort for 8 weeks. [place] of groin in March 2022 without evidence for inguinal hernia. Lumbar spine xray with multilevel degenerative change progressive in interval from exam in 2019. Last colonoscopy in 2016 at outside facility.   RLQ pain always there, worse when laying down, feels cramping.   Globus sensation and solid food dysphagia chronically. Protonix BID. Previously recommended EGD/dilation but had to cancel.  Following plant diet. Ready to pursue EGD/dilation. If does not take Nexium BID, notes GERD.   Past Medical History:  Diagnosis Date  . Anxiety   . Essential hypertension, benign 06/06/2019  . Generalized osteoarthritis   . GERD (gastroesophageal reflux disease)   . Hyperlipidemia   . Hypothyroidism, adult 06/06/2019  . Insomnia   . Malaise and fatigue 06/06/2019  . Obesity (BMI 30.0-34.9) 06/06/2019    Past Surgical History:  Procedure Laterality Date  . COLONOSCOPY  10/2014   Dr. Cristine Polio, outside hospital. Colonoscopy performed with Propofol, no polyps, small to medium size internal hemorrhoids noted.   [name] ECTOPIC PREGNANCY SURGERY    . ELBOW FRACTURE  SURGERY    . HIATAL HERNIA REPAIR    . JOINT REPLACEMENT     bilateral knee  . LIPOMA EXCISION Right 08/26/2020   Procedure: MINOR EXCISION LIPOMA;THIGH;  Surgeon: Virl Cagey, MD;  Location: [facility];  Service: General;  Laterality: Right;  . TONSILLECTOMY      Current Outpatient Medications  Medication Sig Dispense Refill  . Ascorbic Acid (VITAMIN C) 1000 MG tablet Take 1,000 mg by mouth daily.    [name] augmented betamethasone dipropionate (DIPROLENE-AF) 0.05 % cream Apply topically.    . dicyclomine (BENTYL) 10 MG capsule Take 1 capsule (10 mg total) by mouth 4 (four) times daily -  before meals and at bedtime. For cramping as needed 120 capsule 1  . escitalopram (LEXAPRO) 10 MG tablet TAKE 1 TABLET BY MOUTH ONCE DAILY 90 tablet 0  . famotidine (PEPCID) 20 MG tablet Take 20 mg by mouth daily.    . NP THYROID 120 MG tablet TAKE 1 TABLET(120 MG) BY MOUTH TWICE DAILY 180 tablet 1  . NP THYROID 60 MG tablet Take 1 tablet (60 mg total) by mouth daily before breakfast. 30 tablet 3  . progesterone (PROMETRIUM) 200 MG capsule TAKE 3 CAPSULES BY MOUTH EVERY NIGHT 90 capsule 3  . esomeprazole (NEXIUM) 40 MG capsule Take 1 capsule (40 mg total) by mouth 2 (two) times daily before a meal. 30 minutes before meal 60 capsule 3   No current facility-administered medications for this visit.    Allergies as of 11/03/2020 - Review Complete 11/03/2020  Allergen Reaction Noted  .  Dilantin [phenytoin sodium extended] Other (See Comments) 03/28/2019  . Doxycycline hyclate  12/27/2018    Family History  Problem Relation Age of Onset  . Colon cancer Neg Hx     Social History   Socioeconomic History  . Marital status: Widowed    Spouse name: Not on file  . Number of children: Not on file  . Years of education: Not on file  . Highest education level: Not on file  Occupational History  . Occupation: retired  Tobacco Use  . Smoking status: Never Smoker  . Smokeless tobacco: Never Used  Vaping  Use  . Vaping Use: Not on file  Substance and Sexual Activity  . Alcohol use: No    Alcohol/week: 0.0 standard drinks  . Drug use: No  . Sexual activity: Not on file  Other Topics Concern  . Not on file  Social History Narrative   Widow for 56 years-husband died of MI.Lives alone.   Social Determinants of Health   Financial Resource Strain: Not on file  Food Insecurity: Not on file  Transportation Needs: Not on file  Physical Activity: Not on file  Stress: Not on file  Social Connections: Not on file    Review of Systems: Gen: Denies fever, chills, anorexia. Denies fatigue, weakness, weight loss.  CV: Denies chest pain, palpitations, syncope, peripheral edema, and claudication. Resp: Denies dyspnea at rest, cough, wheezing, coughing up blood, and pleurisy. GI: see HPI Derm: Denies rash, itching, dry skin Psych: Denies depression, anxiety, memory loss, confusion. No homicidal or suicidal ideation.  Heme: Denies bruising, bleeding, and enlarged lymph nodes.  Physical Exam: BP (!) 155/82   Pulse (!) 104   Temp (!) 97.1 F (36.2 C) (Temporal)   Ht 5\' 1"  (1.549 m)   Wt 201 lb 6.4 oz (91.4 kg)   BMI 38.05 kg/m  General:   Alert and oriented. No distress noted. Pleasant and cooperative.  Head:  Normocephalic and atraumatic. Eyes:  Conjuctiva clear without scleral icterus. Mouth:  Mask in place Lungs: clear bilaterally Cardiac: S1 S2 present without murmurs Abdomen:  +BS, soft, TTP RLQ and non-distended. No rebound or guarding. No HSM or masses noted. Msk:  Symmetrical without gross deformities. Normal posture. Extremities:  Without edema. Neurologic:  Alert and  oriented x4 Psych:  Alert and cooperative. Normal mood and affect.  ASSESSMENT: Annette Saunders is a 75 y.o. female presenting today with a history of GERD, dysphagia, last EGD several years ago at outside facility but unable to obtain. Issues with dysphagia intermittently; BPEApril 2019 with prior  fundoplication without recurrent hiatal hernia, otherwise normal.Last colonoscopy outside facility in 2016 normal. Now with RLQ abdominal pain for 8 weeks.  Previously with lower abdominal pain that is crampy, now with RLQ abdominal pain that does seem to be precipitated and worsened by movement. Does not appear to be related to bowel habits. [place] of groin in March 2022 through PCP without evidence for inguinal hernia. Lumbar spine xray with multilevel degenerative change progressive in interval from exam in 2019. I wonder if dealing with radiculopathy. However, will update CT to exclude other process as pain is worsening. We also discussed updating colonoscopy here as last was in 2016 at outside facility. She has no overt GI bleeding.   Dysphagia: ready to pursue EGD now. Notes globus sensation and solid food dysphagia. Remains on Nexium BID and has been unable to reduce to once daily. May need manometry or updated BPE if EGD unrevealing. BPEApril 2019 with prior fundoplication  without recurrent hiatal hernia, otherwise normal.   PLAN:  Proceed with colonoscopy/EGD/dilation by Dr. Abbey Chatters  in near future: the risks, benefits, and alternatives have been discussed with the patient in detail. The patient states understanding and desires to proceed.   Nexium BID  CT abd/pelvis with contrast  4 month return   Annitta Needs, PhD, [facility]

## 2020-11-03 NOTE — Progress Notes (Signed)
Cc'ed to pcp °

## 2020-11-03 NOTE — Patient Instructions (Addendum)
We are arranging a CT in near future.   We are also arranging an upper endoscopy with dilation and colonoscopy   Will see you in 4 months or sooner if needed!   I enjoyed seeing you again today! As you know, I value our relationship and want to provide genuine, compassionate, and quality care. I welcome your feedback. If you receive a survey regarding your visit,  I greatly appreciate you taking time to fill this out. See you next time!  Annitta Needs, PhD, ANP-BC [facility]

## 2020-11-05 ENCOUNTER — Encounter (INDEPENDENT_AMBULATORY_CARE_PROVIDER_SITE_OTHER): Payer: Self-pay | Admitting: Nurse Practitioner

## 2020-11-05 ENCOUNTER — Telehealth (INDEPENDENT_AMBULATORY_CARE_PROVIDER_SITE_OTHER): Payer: Medicare Other | Admitting: Nurse Practitioner

## 2020-11-05 ENCOUNTER — Telehealth (INDEPENDENT_AMBULATORY_CARE_PROVIDER_SITE_OTHER): Payer: Self-pay

## 2020-11-05 DIAGNOSIS — N390 Urinary tract infection, site not specified: Secondary | ICD-10-CM | POA: Diagnosis not present

## 2020-11-05 MED ORDER — CIPROFLOXACIN HCL 250 MG PO TABS
250.0000 mg | ORAL_TABLET | Freq: Two times a day (BID) | ORAL | 0 refills | Status: DC
Start: 2020-11-05 — End: 2020-12-08

## 2020-11-05 NOTE — Progress Notes (Signed)
An audio-only tele-health visit was conducted today. I connected with  Annette Saunders on 11/05/20 utilizing audio-only technology and verified that I am speaking with the correct person using two identifiers. The patient was located at their home, and I was located at the office of [facility] during the encounter. I discussed the limitations of evaluation and management by telemedicine. The patient expressed understanding and agreed to proceed.   Subjective:  Patient ID: Annette Saunders, female    DOB: 1945-08-30  Age: 75 y.o. MRN: [id number]  CC:  Chief Complaint  Patient presents with  . Urinary Tract Infection      HPI  This patient arrives today for virtual visit for the above.  She tells me yesterday she started experiencing some mild dysuria.  Since this morning the dysuria has gotten worse and she is also experiencing some frequency and urgency.  She denies any hematuria.    Past Medical History:  Diagnosis Date  . Anxiety   . Essential hypertension, benign 06/06/2019  . Generalized osteoarthritis   . GERD (gastroesophageal reflux disease)   . Hyperlipidemia   . Hypothyroidism, adult 06/06/2019  . Insomnia   . Malaise and fatigue 06/06/2019  . Obesity (BMI 30.0-34.9) 06/06/2019      Family History  Problem Relation Age of Onset  . Colon cancer Neg Hx     Social History   Social History Narrative   Widow for 36 years-husband died of MI.Lives alone.   Social History   Tobacco Use  . Smoking status: Never Smoker  . Smokeless tobacco: Never Used  Substance Use Topics  . Alcohol use: No    Alcohol/week: 0.0 standard drinks     Current Meds  Medication Sig  . ciprofloxacin (CIPRO) 250 MG tablet Take 1 tablet (250 mg total) by mouth 2 (two) times daily.    ROS:  Review of Systems  Constitutional: Negative for chills and fever.  Gastrointestinal: Negative for nausea and vomiting.  Genitourinary: Positive for dysuria, frequency and urgency.  Negative for flank pain and hematuria.     Objective:   Today's Vitals: There were no vitals taken for this visit. Vitals with BMI 11/03/2020 10/15/2020 09/29/2020  Height 5\' 1"  5\' 1"  5\' 1"   Weight 201 lbs 6 oz 202 lbs 6 oz 201 lbs  BMI 46.56 81.27 38  Systolic 517 001 749  Diastolic 82 90 86  Pulse 449 111 111     Physical Exam Comprehensive physical exam not completed today as office visit was conducted remotely.  She sounded well over the phone.  Patient was alert and oriented, and appeared to have appropriate judgment.       Assessment and Plan   1. Urinary tract infection without hematuria, site unspecified      Plan: 1.  We will treat empirically with ciprofloxacin but will have her come by the office later today to drop off a urine sample so we can culture and ensure that ciprofloxacin will appropriately treat her UTI.  I did warn her about possibility of diarrhea and joint pain related to C. difficile versus tendon rupture and that if the side effects were to occur that she should stop the medication notify us.  She tells me she understands.   Tests ordered Orders Placed This Encounter  Procedures  . Urinalysis with Culture Reflex      Meds ordered this encounter  Medications  . ciprofloxacin (CIPRO) 250 MG tablet    Sig: Take 1 tablet (250  mg total) by mouth 2 (two) times daily.    Dispense:  10 tablet    Refill:  0    Order Specific Question:   Supervising Provider    Answer:   Doree Albee [[id number]]    Patient to follow-up as scheduled later this month or sooner as needed.  Total time spent on the telephone today was 8 minutes and 1 second.  Annette Ards, NP

## 2020-11-08 LAB — URINE CULTURE

## 2020-11-08 LAB — URINALYSIS W MICROSCOPIC + REFLEX CULTURE
Bilirubin Urine: NEGATIVE
Glucose, UA: NEGATIVE
Hyaline Cast: NONE SEEN /LPF
Ketones, ur: NEGATIVE
Nitrites, Initial: NEGATIVE
Specific Gravity, Urine: 1.02 (ref 1.001–1.03)
Squamous Epithelial / HPF: NONE SEEN /HPF (ref <=5)
pH: 5.5 (ref 5.0–8.0)

## 2020-11-08 LAB — CULTURE INDICATED

## 2020-11-17 ENCOUNTER — Ambulatory Visit (INDEPENDENT_AMBULATORY_CARE_PROVIDER_SITE_OTHER): Payer: Medicare Other | Admitting: Nurse Practitioner

## 2020-11-26 ENCOUNTER — Ambulatory Visit (HOSPITAL_COMMUNITY): Payer: Medicare Other

## 2020-12-03 ENCOUNTER — Ambulatory Visit (HOSPITAL_COMMUNITY)
Admission: RE | Admit: 2020-12-03 | Discharge: 2020-12-03 | Disposition: A | Discharge status: Home / Self Care | Payer: Medicare Other | Source: Ambulatory Visit | Attending: Gastroenterology | Admitting: Gastroenterology

## 2020-12-03 ENCOUNTER — Other Ambulatory Visit: Payer: Self-pay

## 2020-12-03 ENCOUNTER — Telehealth: Payer: Self-pay

## 2020-12-03 DIAGNOSIS — R109 Unspecified abdominal pain: Secondary | ICD-10-CM | POA: Diagnosis not present

## 2020-12-03 DIAGNOSIS — R1031 Right lower quadrant pain: Secondary | ICD-10-CM | POA: Insufficient documentation

## 2020-12-03 LAB — POCT I-STAT CREATININE: Creatinine, Ser: 0.8 mg/dL (ref 0.44–1.00)

## 2020-12-03 MED ORDER — IOHEXOL 300 MG/ML  SOLN
100.0000 mL | Freq: Once | INTRAMUSCULAR | Status: AC | PRN
  Administered 2020-12-03: 100 mL via INTRAVENOUS

## 2020-12-03 NOTE — Telephone Encounter (Signed)
Hi Leslie,  Pt called to advise me that she had a CT done this morning (sent by Vicente Males for pains in her right abd area) and every since she had the IV contrast the pain has been a lot worse. Pt states her pain there has not been this severe before now. No nausea or vomiting noted. Please advise. Sent to you because Vicente Males is off.

## 2020-12-03 NOTE — Patient Instructions (Signed)
Annette Saunders  12/03/2020     @PREFPERIOPPHARMACY @   Your procedure is scheduled on  12/08/2020   Report to [facility] at  0900  A.M.   Call this number if you have problems the morning of surgery:  [phone]   Remember:  Follow the diet and prep instructions given to you by the office.                    Take these medicines the morning of surgery with A SIP OF WATER  NP thyroid, nexium, pepcid     Please brush your teeth  Do not wear jewelry, make-up or nail polish.  Do not wear lotions, powders, or perfumes, or deodorant.  Do not shave 48 hours prior to surgery.  Men may shave face and neck.  Do not bring valuables to the hospital.  [facility] is not responsible for any belongings or valuables.  Contacts, dentures or bridgework may not be worn into surgery.  Leave your suitcase in the car.  After surgery it may be brought to your room.  For patients admitted to the hospital, discharge time will be determined by your treatment team.  Patients discharged the day of surgery will not be allowed to drive home and must have someone with them for 24 hours.   Special instructions:   DO NOT smoke tobacco or vape for 24 hours before your surgery.   Please read over the following fact sheets that you were given. Anesthesia Post-op Instructions and Care and Recovery After Surgery       Colonoscopy, Adult, Care After This sheet gives you information about how to care for yourself after your procedure. Your health care provider may also give you more specific instructions. If you have problems or questions, contact your health care provider. What can I expect after the procedure? After the procedure, it is common to have:  A small amount of blood in your stool for 24 hours after the procedure.  Some gas.  Mild cramping or bloating of your abdomen. Follow these instructions at home: Eating and drinking  Drink enough fluid to keep your urine pale  yellow.  Follow instructions from your health care provider about eating or drinking restrictions.  Resume your normal diet as instructed by your health care provider. Avoid heavy or fried foods that are hard to digest.   Activity  Rest as told by your health care provider.  Avoid sitting for a long time without moving. Get up to take short walks every 1-2 hours. This is important to improve blood flow and breathing. Ask for help if you feel weak or unsteady.  Return to your normal activities as told by your health care provider. Ask your health care provider what activities are safe for you. Managing cramping and bloating  Try walking around when you have cramps or feel bloated.  Apply heat to your abdomen as told by your health care provider. Use the heat source that your health care provider recommends, such as a moist heat pack or a heating pad. ? Place a towel between your skin and the heat source. ? Leave the heat on for 20-30 minutes. ? Remove the heat if your skin turns bright red. This is especially important if you are unable to feel pain, heat, or cold. You may have a greater risk of getting burned.   General instructions  If you were given a sedative during the procedure, it can  affect you for several hours. Do not drive or operate machinery until your health care provider says that it is safe.  For the first 24 hours after the procedure: ? Do not sign important documents. ? Do not drink alcohol. ? Do your regular daily activities at a slower pace than normal. ? Eat soft foods that are easy to digest.  Take over-the-counter and prescription medicines only as told by your health care provider.  Keep all follow-up visits as told by your health care provider. This is important. Contact a health care provider if:  You have blood in your stool 2-3 days after the procedure. Get help right away if you have:  More than a small spotting of blood in your stool.  Large blood  clots in your stool.  Swelling of your abdomen.  Nausea or vomiting.  A fever.  Increasing pain in your abdomen that is not relieved with medicine. Summary  After the procedure, it is common to have a small amount of blood in your stool. You may also have mild cramping and bloating of your abdomen.  If you were given a sedative during the procedure, it can affect you for several hours. Do not drive or operate machinery until your health care provider says that it is safe.  Get help right away if you have a lot of blood in your stool, nausea or vomiting, a fever, or increased pain in your abdomen. This information is not intended to replace advice given to you by your health care provider. Make sure you discuss any questions you have with your health care provider. Document Revised: 08/09/2019 Document Reviewed: 03/11/2019 Elsevier Patient Education  2021 [place] After This sheet gives you information about how to care for yourself after your procedure. Your health care provider may also give you more specific instructions. If you have problems or questions, contact your health care provider. What can I expect after the procedure? After the procedure, it is common to have:  Tiredness.  Forgetfulness about what happened after the procedure.  Impaired judgment for important decisions.  Nausea or vomiting.  Some difficulty with balance. Follow these instructions at home: For the time period you were told by your health care provider:  Rest as needed.  Do not participate in activities where you could fall or become injured.  Do not drive or use machinery.  Do not drink alcohol.  Do not take sleeping pills or medicines that cause drowsiness.  Do not make important decisions or sign legal documents.  Do not take care of children on your own.      Eating and drinking  Follow the diet that is recommended by your health care  provider.  Drink enough fluid to keep your urine pale yellow.  If you vomit: ? Drink water, juice, or soup when you can drink without vomiting. ? Make sure you have little or no nausea before eating solid foods. General instructions  Have a responsible adult stay with you for the time you are told. It is important to have someone help care for you until you are awake and alert.  Take over-the-counter and prescription medicines only as told by your health care provider.  If you have sleep apnea, surgery and certain medicines can increase your risk for breathing problems. Follow instructions from your health care provider about wearing your sleep device: ? Anytime you are sleeping, including during daytime naps. ? While taking prescription pain medicines, sleeping medicines, or  medicines that make you drowsy.  Avoid smoking.  Keep all follow-up visits as told by your health care provider. This is important. Contact a health care provider if:  You keep feeling nauseous or you keep vomiting.  You feel light-headed.  You are still sleepy or having trouble with balance after 24 hours.  You develop a rash.  You have a fever.  You have redness or swelling around the IV site. Get help right away if:  You have trouble breathing.  You have new-onset confusion at home. Summary  For several hours after your procedure, you may feel tired. You may also be forgetful and have poor judgment.  Have a responsible adult stay with you for the time you are told. It is important to have someone help care for you until you are awake and alert.  Rest as told. Do not drive or operate machinery. Do not drink alcohol or take sleeping pills.  Get help right away if you have trouble breathing, or if you suddenly become confused. This information is not intended to replace advice given to you by your health care provider. Make sure you discuss any questions you have with your health care  provider. Document Revised: 04/30/2020 Document Reviewed: 07/18/2019 Elsevier Patient Education  2021 [employer].

## 2020-12-04 ENCOUNTER — Other Ambulatory Visit (HOSPITAL_COMMUNITY)
Admission: RE | Admit: 2020-12-04 | Discharge: 2020-12-04 | Disposition: A | Discharge status: Home / Self Care | Payer: Medicare Other | Source: Ambulatory Visit | Attending: Internal Medicine | Admitting: Internal Medicine

## 2020-12-04 ENCOUNTER — Encounter (HOSPITAL_COMMUNITY)
Admission: RE | Admit: 2020-12-04 | Discharge: 2020-12-04 | Disposition: A | Discharge status: Home / Self Care | Payer: Medicare Other | Source: Ambulatory Visit | Attending: Internal Medicine | Admitting: Internal Medicine

## 2020-12-04 ENCOUNTER — Other Ambulatory Visit: Payer: Self-pay

## 2020-12-04 ENCOUNTER — Encounter (HOSPITAL_COMMUNITY): Payer: Self-pay

## 2020-12-04 DIAGNOSIS — Z01812 Encounter for preprocedural laboratory examination: Secondary | ICD-10-CM | POA: Insufficient documentation

## 2020-12-04 DIAGNOSIS — Z20822 Contact with and (suspected) exposure to covid-19: Secondary | ICD-10-CM | POA: Insufficient documentation

## 2020-12-04 NOTE — Telephone Encounter (Signed)
Please have addressed by Vicente Males. I did not receive this message until after hours.

## 2020-12-04 NOTE — Telephone Encounter (Signed)
Just spoke with the pt, she is still in pain,but the pain is less right now (pain level 6). It gets worse if she has to bend down for any reason. She finds relief when she props up her legs. Pt still states no nausea or vomiting. I advised her we still don't have her CT results yet. Advised the pt if pain gets worse (compared to yesterday) please go to the ED to be evaluated. Pt states she will wait to hear from [place] regarding her scan and your instructions. Please advise

## 2020-12-04 NOTE — Telephone Encounter (Signed)
[name],  I still don't have CT results. Can we check on patient and see how she is doing? If severe pain, go to ED.

## 2020-12-04 NOTE — Telephone Encounter (Signed)
Hey, I'm on it

## 2020-12-05 LAB — SARS CORONAVIRUS 2 (TAT 6-24 HRS): SARS Coronavirus 2: NEGATIVE

## 2020-12-07 ENCOUNTER — Other Ambulatory Visit (INDEPENDENT_AMBULATORY_CARE_PROVIDER_SITE_OTHER): Payer: Self-pay | Admitting: Nurse Practitioner

## 2020-12-07 DIAGNOSIS — F419 Anxiety disorder, unspecified: Secondary | ICD-10-CM

## 2020-12-08 ENCOUNTER — Encounter (HOSPITAL_COMMUNITY): Payer: Self-pay

## 2020-12-08 ENCOUNTER — Ambulatory Visit (HOSPITAL_COMMUNITY): Payer: Medicare Other | Admitting: Anesthesiology

## 2020-12-08 ENCOUNTER — Ambulatory Visit (HOSPITAL_COMMUNITY)
Admission: RE | Admit: 2020-12-08 | Discharge: 2020-12-08 | Disposition: A | Discharge status: Home / Self Care | Payer: Medicare Other | Attending: Internal Medicine | Admitting: Internal Medicine

## 2020-12-08 ENCOUNTER — Other Ambulatory Visit (INDEPENDENT_AMBULATORY_CARE_PROVIDER_SITE_OTHER): Payer: Self-pay | Admitting: Internal Medicine

## 2020-12-08 ENCOUNTER — Encounter (HOSPITAL_COMMUNITY)
Admission: RE | Disposition: A | Discharge status: Home / Self Care | Payer: Self-pay | Source: Home / Self Care | Attending: Internal Medicine

## 2020-12-08 DIAGNOSIS — Z79899 Other long term (current) drug therapy: Secondary | ICD-10-CM | POA: Diagnosis not present

## 2020-12-08 DIAGNOSIS — K2970 Gastritis, unspecified, without bleeding: Secondary | ICD-10-CM | POA: Insufficient documentation

## 2020-12-08 DIAGNOSIS — R1310 Dysphagia, unspecified: Secondary | ICD-10-CM | POA: Diagnosis not present

## 2020-12-08 DIAGNOSIS — K3189 Other diseases of stomach and duodenum: Secondary | ICD-10-CM | POA: Diagnosis not present

## 2020-12-08 DIAGNOSIS — Z79890 Hormone replacement therapy (postmenopausal): Secondary | ICD-10-CM | POA: Insufficient documentation

## 2020-12-08 DIAGNOSIS — R1031 Right lower quadrant pain: Secondary | ICD-10-CM | POA: Diagnosis not present

## 2020-12-08 DIAGNOSIS — E039 Hypothyroidism, unspecified: Secondary | ICD-10-CM | POA: Diagnosis not present

## 2020-12-08 DIAGNOSIS — K222 Esophageal obstruction: Secondary | ICD-10-CM

## 2020-12-08 DIAGNOSIS — R1032 Left lower quadrant pain: Secondary | ICD-10-CM | POA: Diagnosis not present

## 2020-12-08 DIAGNOSIS — D123 Benign neoplasm of transverse colon: Secondary | ICD-10-CM | POA: Insufficient documentation

## 2020-12-08 DIAGNOSIS — Z888 Allergy status to other drugs, medicaments and biological substances status: Secondary | ICD-10-CM | POA: Insufficient documentation

## 2020-12-08 DIAGNOSIS — K648 Other hemorrhoids: Secondary | ICD-10-CM | POA: Insufficient documentation

## 2020-12-08 DIAGNOSIS — K635 Polyp of colon: Secondary | ICD-10-CM

## 2020-12-08 DIAGNOSIS — K219 Gastro-esophageal reflux disease without esophagitis: Secondary | ICD-10-CM | POA: Diagnosis not present

## 2020-12-08 DIAGNOSIS — Z886 Allergy status to analgesic agent status: Secondary | ICD-10-CM | POA: Diagnosis not present

## 2020-12-08 HISTORY — PX: POLYPECTOMY: SHX5525

## 2020-12-08 HISTORY — PX: ESOPHAGOGASTRODUODENOSCOPY (EGD) WITH PROPOFOL: SHX5813

## 2020-12-08 HISTORY — PX: BALLOON DILATION: SHX5330

## 2020-12-08 HISTORY — PX: COLONOSCOPY WITH PROPOFOL: SHX5780

## 2020-12-08 HISTORY — PX: BIOPSY: SHX5522

## 2020-12-08 SURGERY — COLONOSCOPY WITH PROPOFOL
Anesthesia: General

## 2020-12-08 MED ORDER — LIDOCAINE VISCOUS HCL 2 % MT SOLN
15.0000 mL | Freq: Once | OROMUCOSAL | Status: AC
  Administered 2020-12-08: 15 mL via OROMUCOSAL

## 2020-12-08 MED ORDER — PROPOFOL 500 MG/50ML IV EMUL
INTRAVENOUS | Status: DC | PRN
  Administered 2020-12-08: 150 ug/kg/min via INTRAVENOUS

## 2020-12-08 MED ORDER — PHENYLEPHRINE 40 MCG/ML (10ML) SYRINGE FOR IV PUSH (FOR BLOOD PRESSURE SUPPORT)
PREFILLED_SYRINGE | INTRAVENOUS | Status: DC | PRN
  Administered 2020-12-08: 40 ug via INTRAVENOUS
  Administered 2020-12-08: 80 ug via INTRAVENOUS

## 2020-12-08 MED ORDER — PROPOFOL 10 MG/ML IV BOLUS
INTRAVENOUS | Status: DC | PRN
  Administered 2020-12-08: 100 mg via INTRAVENOUS
  Administered 2020-12-08 (×3): 50 mg via INTRAVENOUS
  Administered 2020-12-08: 40 mg via INTRAVENOUS
  Administered 2020-12-08: 50 mg via INTRAVENOUS

## 2020-12-08 MED ORDER — STERILE WATER FOR IRRIGATION IR SOLN
Status: DC | PRN
  Administered 2020-12-08: 1.5 mL

## 2020-12-08 MED ORDER — LIDOCAINE VISCOUS HCL 2 % MT SOLN
OROMUCOSAL | Status: AC
  Filled 2020-12-08: qty 15

## 2020-12-08 MED ORDER — LIDOCAINE HCL (CARDIAC) PF 100 MG/5ML IV SOSY
PREFILLED_SYRINGE | INTRAVENOUS | Status: DC | PRN
  Administered 2020-12-08: 50 mg via INTRAVENOUS

## 2020-12-08 MED ORDER — LACTATED RINGERS IV SOLN
INTRAVENOUS | Status: DC
  Administered 2020-12-08: 1000 mL via INTRAVENOUS

## 2020-12-08 NOTE — Op Note (Signed)
[facility] Patient Name: Annette Saunders Procedure Date: 12/08/2020 10:02 AM MRN: [id number] Date of Birth: Apr 30, 1946 Attending MD: Elon Alas. Abbey Chatters DO CSN: [id number] Age: 75 Admit Type: Outpatient Procedure:                Upper GI endoscopy Indications:              Dysphagia Providers:                Elon Alas. Cameron Schwinn, DO, Otis Peak B. Sharon Seller, RN,                            Raphael Gibney, Technician Referring MD:              Medicines:                See the Anesthesia note for documentation of the                            administered medications Complications:            No immediate complications. Estimated Blood Loss:     Estimated blood loss was minimal. Procedure:                Pre-Anesthesia Assessment:                           - The anesthesia plan was to use monitored                            anesthesia care (MAC).                           After obtaining informed consent, the endoscope was                            passed under direct vision. Throughout the                            procedure, the patient's blood pressure, pulse, and                            oxygen saturations were monitored continuously. The                            GIF-H190 ([id number]) scope was introduced through the                            mouth, and advanced to the second part of duodenum.                            The upper GI endoscopy was accomplished without                            difficulty. The patient tolerated the procedure                            well. Scope In: 10:04:38 AM  Scope Out: 10:09:31 AM Total Procedure Duration: 0 hours 4 minutes 53 seconds  Findings:      One benign-appearing, intrinsic mild stenosis was found in the lower       third of the esophagus. The stenosis was traversed. A TTS dilator was       passed through the scope. Dilation with an 18-19-20 mm balloon dilator       was performed to 20 mm. The dilation site was examined and showed        moderate improvement in luminal narrowing.      Localized mild inflammation characterized by erythema was found in the       gastric antrum. Biopsies were taken with a cold forceps for Helicobacter       pylori testing.      The duodenal bulb, first portion of the duodenum and second portion of       the duodenum were normal. Bile present in second portion. Impression:               - Benign-appearing esophageal stenosis. Dilated.                           - Gastritis. Biopsied.                           - Normal duodenal bulb, first portion of the                            duodenum and second portion of the duodenum. Moderate Sedation:      Per Anesthesia Care Recommendation:           - Patient has a contact number available for                            emergencies. The signs and symptoms of potential                            delayed complications were discussed with the                            patient. Return to normal activities tomorrow.                            Written discharge instructions were provided to the                            patient.                           - Resume previous diet.                           - Continue present medications.                           - Await pathology results.                           - Repeat upper endoscopy PRN for retreatment.                           -  Return to GI clinic in 3 months. Procedure Code(s):        --- Professional ---                           [phone], Esophagogastroduodenoscopy, flexible,                            transoral; with transendoscopic balloon dilation of                            esophagus (less than 30 mm diameter)                           [id number], 59, Esophagogastroduodenoscopy, flexible,                            transoral; with biopsy, single or multiple Diagnosis Code(s):        --- Professional ---                           K22.2, Esophageal obstruction                            K29.70, Gastritis, unspecified, without bleeding                           R13.10, Dysphagia, unspecified CPT copyright 2019 American Medical Association. All rights reserved. The codes documented in this report are preliminary and upon coder review may  be revised to meet current compliance requirements. Elon Alas. Abbey Chatters, DO Anthony Abbey Chatters, DO 12/08/2020 10:11:57 AM This report has been signed electronically. Number of Addenda: 0

## 2020-12-08 NOTE — H&P (Signed)
Primary Care Physician:  Doree Albee, MD Primary Gastroenterologist:  Dr. Abbey Chatters  Pre-Procedure History & Physical: HPI:  Annette Saunders is a 75 y.o. female is here for an EGD for dysphagia and chronic GERD and a colonoscopy for lower abdominal pain.   She has had bilateral lower quadrant abdominal pain intermittently in the past. Bentyl BID. She is not sure if this is helping or not and will try without it. No association with bowel habits. CT Sept 2021 without explanation for symptoms. Now with RLQ discomfort for 8 weeks. [place] of groin in March 2022 without evidence for inguinal hernia. Lumbar spine xray with multilevel degenerative change progressive in interval from exam in 2019. Last colonoscopy in 2016 at outside facility.   RLQ pain always there, worse when laying down, feels cramping.   Globus sensation and solid food dysphagia chronically. PPI BID. Previously recommended EGD/dilation but had to cancel.  Past Medical History:  Diagnosis Date  . Anxiety   . Essential hypertension, benign 06/06/2019  . Generalized osteoarthritis   . GERD (gastroesophageal reflux disease)   . Hyperlipidemia   . Hypothyroidism, adult 06/06/2019  . Insomnia   . Malaise and fatigue 06/06/2019  . Obesity (BMI 30.0-34.9) 06/06/2019    Past Surgical History:  Procedure Laterality Date  . COLONOSCOPY  10/2014   Dr. Cristine Polio, outside hospital. Colonoscopy performed with Propofol, no polyps, small to medium size internal hemorrhoids noted.   [name] ECTOPIC PREGNANCY SURGERY    . ELBOW FRACTURE SURGERY    . HIATAL HERNIA REPAIR    . JOINT REPLACEMENT     bilateral knee  . LIPOMA EXCISION Right 08/26/2020   Procedure: MINOR EXCISION LIPOMA;THIGH;  Surgeon: Virl Cagey, MD;  Location: [facility];  Service: General;  Laterality: Right;  . TONSILLECTOMY      Prior to Admission medications   Medication Sig Start Date End Date Taking? Authorizing Provider  Ascorbic Acid (VITAMIN C) 1000 MG tablet Take  1,000 mg by mouth daily.   Yes [provider]  dicyclomine (BENTYL) 10 MG capsule Take 1 capsule (10 mg total) by mouth 4 (four) times daily -  before meals and at bedtime. For cramping as needed 06/19/20  Yes Annitta Needs, NP  escitalopram (LEXAPRO) 10 MG tablet Take 1 tablet (10 mg total) by mouth daily. 12/07/20  Yes Ailene Ards, NP  esomeprazole (NEXIUM) 40 MG capsule Take 1 capsule (40 mg total) by mouth 2 (two) times daily before a meal. 30 minutes before meal 11/03/20  Yes Annitta Needs, NP  NP THYROID 120 MG tablet TAKE 1 TABLET(120 MG) BY MOUTH TWICE DAILY Patient taking differently: Take 120 mg by mouth in the morning and at bedtime. 10/14/20  Yes Doree Albee, MD  NP THYROID 60 MG tablet Take 1 tablet (60 mg total) by mouth daily before breakfast. 09/10/20  Yes Gosrani, Nimish C, MD  polyethylene glycol-electrolytes (NULYTELY) 420 g solution As directed 11/03/20  Yes Kameron Blethen K, DO  progesterone (PROMETRIUM) 200 MG capsule TAKE 3 CAPSULES BY MOUTH EVERY NIGHT Patient taking differently: Take 600 mg by mouth daily. 08/25/20  Yes Gosrani, Nimish C, MD  ciprofloxacin (CIPRO) 250 MG tablet Take 1 tablet (250 mg total) by mouth 2 (two) times daily. Patient not taking: No sig reported 11/05/20   Ailene Ards, NP    Allergies as of 11/03/2020 - Review Complete 11/03/2020  Allergen Reaction Noted  . Dilantin [phenytoin sodium extended] Other (See Comments) 03/28/2019  .  Doxycycline hyclate  12/27/2018    Family History  Problem Relation Age of Onset  . Colon cancer Neg Hx     Social History   Socioeconomic History  . Marital status: Widowed    Spouse name: Not on file  . Number of children: Not on file  . Years of education: Not on file  . Highest education level: Not on file  Occupational History  . Occupation: retired  Tobacco Use  . Smoking status: Never Smoker  . Smokeless tobacco: Never Used  Vaping Use  . Vaping Use: Not on file  Substance and Sexual  Activity  . Alcohol use: No    Alcohol/week: 0.0 standard drinks  . Drug use: No  . Sexual activity: Not on file  Other Topics Concern  . Not on file  Social History Narrative   Widow for 14 years-husband died of MI.Lives alone.   Social Determinants of Health   Financial Resource Strain: Not on file  Food Insecurity: Not on file  Transportation Needs: Not on file  Physical Activity: Not on file  Stress: Not on file  Social Connections: Not on file  Intimate Partner Violence: Not on file    Review of Systems: See HPI, otherwise negative ROS  Physical Exam: Vital signs in last 24 hours: Temp:  [97.8 F (36.6 C)] 97.8 F (36.6 C) (04/12 0855) Pulse Rate:  [122] 122 (04/12 0855) Resp:  [19] 19 (04/12 0855) BP: (156)/(101) 156/101 (04/12 0855) SpO2:  [96 %] 96 % (04/12 0855)   General:   Alert,  Well-developed, well-nourished, pleasant and cooperative in NAD Head:  Normocephalic and atraumatic. Eyes:  Sclera clear, no icterus.   Conjunctiva pink. Ears:  Normal auditory acuity. Nose:  No deformity, discharge,  or lesions. Mouth:  No deformity or lesions, dentition normal. Neck:  Supple; no masses or thyromegaly. Lungs:  Clear throughout to auscultation.   No wheezes, crackles, or rhonchi. No acute distress. Heart:  Regular rate and rhythm; no murmurs, clicks, rubs,  or gallops. Abdomen:  Soft, nontender and nondistended. No masses, hepatosplenomegaly or hernias noted. Normal bowel sounds, without guarding, and without rebound.   Msk:  Symmetrical without gross deformities. Normal posture. Extremities:  Without clubbing or edema. Neurologic:  Alert and  oriented x4;  grossly normal neurologically. Skin:  Intact without significant lesions or rashes. Cervical Nodes:  No significant cervical adenopathy. Psych:  Alert and cooperative. Normal mood and affect.  Impression/Plan: Annette Saunders is here for an EGD for dysphagia and chronic GERD and a colonoscopy for lower  abdominal pain.   The risks of the procedure including infection, bleed, or perforation as well as benefits, limitations, alternatives and imponderables have been reviewed with the patient. Questions have been answered. All parties agreeable.

## 2020-12-08 NOTE — Op Note (Signed)
[facility] Patient Name: Annette Saunders Procedure Date: 12/08/2020 10:11 AM MRN: [id number] Date of Birth: [birth year] Attending MD: Elon Alas. Abbey Chatters DO CSN: [id number] Age: 75 Admit Type: Outpatient Procedure:                Colonoscopy Indications:              Abdominal pain in the left lower quadrant,                            Abdominal pain in the right lower quadrant Providers:                Elon Alas. Abbey Chatters, DO, Caprice Kluver, Raphael Gibney,                            Technician Referring MD:              Medicines:                See the Anesthesia note for documentation of the                            administered medications Complications:            No immediate complications. Estimated Blood Loss:     Estimated blood loss was minimal. Procedure:                Pre-Anesthesia Assessment:                           - The anesthesia plan was to use monitored                            anesthesia care (MAC).                           After obtaining informed consent, the colonoscope                            was passed under direct vision. Throughout the                            procedure, the patient's blood pressure, pulse, and                            oxygen saturations were monitored continuously. The                            PCF-H190DL ([id number] scope was introduced through                            the anus and advanced to the the cecum, identified                            by appendiceal orifice and ileocecal valve. The                            colonoscopy was performed without  difficulty. The                            patient tolerated the procedure well. The quality                            of the bowel preparation was evaluated using the                            BBPS [facility] Bowel Preparation Scale) with scores                            of: Right Colon = 2 (minor amount of residual                            staining, small fragments of  stool and/or opaque                            liquid, but mucosa seen well), Transverse Colon = 2                            (minor amount of residual staining, small fragments                            of stool and/or opaque liquid, but mucosa seen                            well) and Left Colon = 2 (minor amount of residual                            staining, small fragments of stool and/or opaque                            liquid, but mucosa seen well). The total BBPS score                            equals 6. The quality of the bowel preparation was                            fair. Scope In: 10:15:45 AM Scope Out: 10:28:21 AM Scope Withdrawal Time: 0 hours 7 minutes 55 seconds  Total Procedure Duration: 0 hours 12 minutes 36 seconds  Findings:      The perianal and digital rectal examinations were normal.      Non-bleeding internal hemorrhoids were found during endoscopy.      A 2 mm polyp was found in the transverse colon. The polyp was sessile.       The polyp was removed with a cold biopsy forceps. Resection and       retrieval were complete.      The exam was otherwise without abnormality. Impression:               - Preparation of the colon was fair.                           -  Non-bleeding internal hemorrhoids.                           - One 2 mm polyp in the transverse colon, removed                            with a cold biopsy forceps. Resected and retrieved.                           - The examination was otherwise normal. Moderate Sedation:      Per Anesthesia Care Recommendation:           - Patient has a contact number available for                            emergencies. The signs and symptoms of potential                            delayed complications were discussed with the                            patient. Return to normal activities tomorrow.                            Written discharge instructions were provided to the                             patient.                           - Resume previous diet.                           - Continue present medications.                           - Await pathology results.                           - Repeat colonoscopy in 5 years for surveillance.                           - Return to GI clinic as previously scheduled. Procedure Code(s):        --- Professional ---                           [phone], Colonoscopy, flexible; with biopsy, single                            or multiple Diagnosis Code(s):        --- Professional ---                           K64.8, Other hemorrhoids                           K63.5, Polyp of colon  R10.32, Left lower quadrant pain                           R10.31, Right lower quadrant pain CPT copyright 2019 American Medical Association. All rights reserved. The codes documented in this report are preliminary and upon coder review may  be revised to meet current compliance requirements. Elon Alas. Abbey Chatters, DO [place] Abbey Chatters, DO 12/08/2020 10:32:52 AM This report has been signed electronically. Number of Addenda: 0

## 2020-12-08 NOTE — Discharge Instructions (Signed)
EGD Discharge instructions Please read the instructions outlined below and refer to this sheet in the next few weeks. These discharge instructions provide you with general information on caring for yourself after you leave the hospital. Your doctor may also give you specific instructions. While your treatment has been planned according to the most current medical practices available, unavoidable complications occasionally occur. If you have any problems or questions after discharge, please call your doctor. ACTIVITY  You may resume your regular activity but move at a slower pace for the next 24 hours.   Take frequent rest periods for the next 24 hours.   Walking will help expel (get rid of) the air and reduce the bloated feeling in your abdomen.   No driving for 24 hours (because of the anesthesia (medicine) used during the test).   You may shower.   Do not sign any important legal documents or operate any machinery for 24 hours (because of the anesthesia used during the test).  NUTRITION  Drink plenty of fluids.   You may resume your normal diet.   Begin with a light meal and progress to your normal diet.   Avoid alcoholic beverages for 24 hours or as instructed by your caregiver.  MEDICATIONS  You may resume your normal medications unless your caregiver tells you otherwise.  WHAT YOU CAN EXPECT TODAY  You may experience abdominal discomfort such as a feeling of fullness or "gas" pains.  FOLLOW-UP  Your doctor will discuss the results of your test with you.  SEEK IMMEDIATE MEDICAL ATTENTION IF ANY OF THE FOLLOWING OCCUR:  Excessive nausea (feeling sick to your stomach) and/or vomiting.   Severe abdominal pain and distention (swelling).   Trouble swallowing.   Temperature over 101 F (37.8 C).   Rectal bleeding or vomiting of blood.    Colonoscopy Discharge Instructions  Read the instructions outlined below and refer to this sheet in the next few weeks. These  discharge instructions provide you with general information on caring for yourself after you leave the hospital. Your doctor may also give you specific instructions. While your treatment has been planned according to the most current medical practices available, unavoidable complications occasionally occur.   ACTIVITY  You may resume your regular activity, but move at a slower pace for the next 24 hours.   Take frequent rest periods for the next 24 hours.   Walking will help get rid of the air and reduce the bloated feeling in your belly (abdomen).   No driving for 24 hours (because of the medicine (anesthesia) used during the test).    Do not sign any important legal documents or operate any machinery for 24 hours (because of the anesthesia used during the test).  NUTRITION  Drink plenty of fluids.   You may resume your normal diet as instructed by your doctor.   Begin with a light meal and progress to your normal diet. Heavy or fried foods are harder to digest and may make you feel sick to your stomach (nauseated).   Avoid alcoholic beverages for 24 hours or as instructed.  MEDICATIONS  You may resume your normal medications unless your doctor tells you otherwise.  WHAT YOU CAN EXPECT TODAY  Some feelings of bloating in the abdomen.   Passage of more gas than usual.   Spotting of blood in your stool or on the toilet paper.  IF YOU HAD POLYPS REMOVED DURING THE COLONOSCOPY:  No aspirin products for 7 days or as instructed.  No alcohol for 7 days or as instructed.   Eat a soft diet for the next 24 hours.  FINDING OUT THE RESULTS OF YOUR TEST Not all test results are available during your visit. If your test results are not back during the visit, make an appointment with your caregiver to find out the results. Do not assume everything is normal if you have not heard from your caregiver or the medical facility. It is important for you to follow up on all of your test results.   SEEK IMMEDIATE MEDICAL ATTENTION IF:  You have more than a spotting of blood in your stool.   Your belly is swollen (abdominal distention).   You are nauseated or vomiting.   You have a temperature over 101.   You have abdominal pain or discomfort that is severe or gets worse throughout the day.   Your EGD revealed a mild amount inflammation in your stomach.  I took biopsies of this to rule infection with bacteria called H. pylori.  You did have a slight narrowing of your esophagus so I performed dilation with a balloon.  Hopefully help with your swallowing.  Await pathology results my office will contact you.  Continue on Nexium twice daily. Your colonoscopy revealed 1 polyp(s) which I removed successfully.  I recommend repeating colonoscopy in 5 years for surveillance purposes.    I hope you have a great rest of your week!  Elon Alas. Abbey Chatters, D.O. Gastroenterology and Hepatology [facility]

## 2020-12-08 NOTE — Anesthesia Procedure Notes (Signed)
Date/Time: 12/08/2020 10:11 AM Performed by: Orlie Dakin, CRNA Pre-anesthesia Checklist: Patient identified, Emergency Drugs available, Suction available and Patient being monitored Patient Re-evaluated:Patient Re-evaluated prior to induction Oxygen Delivery Method: Nasal cannula Induction Type: IV induction Placement Confirmation: positive ETCO2

## 2020-12-08 NOTE — Transfer of Care (Signed)
Immediate Anesthesia Transfer of Care Note  Patient: Joleen Keshishyan  Procedure(s) Performed: COLONOSCOPY WITH PROPOFOL (N/A ) ESOPHAGOGASTRODUODENOSCOPY (EGD) WITH PROPOFOL (N/A ) BALLOON DILATION (N/A ) BIOPSY POLYPECTOMY  Patient Location: Short Stay  Anesthesia Type:General  Level of Consciousness: awake, alert  and oriented  Airway & Oxygen Therapy: Patient Spontanous Breathing  Post-op Assessment: Report given to RN, Post -op Vital signs reviewed and stable and Patient moving all extremities X 4  Post vital signs: Reviewed and stable  Last Vitals:  Vitals Value Taken Time  BP    Temp    Pulse    Resp 18 12/08/20 1033  SpO2      Last Pain:  Vitals:   12/08/20 1033  TempSrc:   PainSc: 0-No pain      Patients Stated Pain Goal: 5 (40/34/74 2595)  Complications: No complications documented.

## 2020-12-08 NOTE — Anesthesia Preprocedure Evaluation (Addendum)
Anesthesia Evaluation  Patient identified by MRN, date of birth, ID band Patient awake    Reviewed: Allergy & Precautions, NPO status , Patient's Chart, lab work & pertinent test results  History of Anesthesia Complications Negative for: history of anesthetic complications  Airway Mallampati: III  TM Distance: >3 FB Neck ROM: Full    Dental  (+) Dental Advisory Given Crown :   Pulmonary shortness of breath,    Pulmonary exam normal breath sounds clear to auscultation       Cardiovascular Exercise Tolerance: Good hypertension, Pt. on medications Normal cardiovascular exam Rhythm:Regular Rate:Normal     Neuro/Psych negative neurological ROS  negative psych ROS   GI/Hepatic GERD  Medicated and Controlled,  Endo/Other  Hypothyroidism   Renal/GU Renal disease     Musculoskeletal  (+) Arthritis ,   Abdominal   Peds  Hematology   Anesthesia Other Findings   Reproductive/Obstetrics                            Anesthesia Physical Anesthesia Plan  ASA: II  Anesthesia Plan: General   Post-op Pain Management:    Induction: Intravenous  PONV Risk Score and Plan: Propofol infusion  Airway Management Planned: Nasal Cannula and Natural Airway  Additional Equipment:   Intra-op Plan:   Post-operative Plan:   Informed Consent: I have reviewed the patients History and Physical, chart, labs and discussed the procedure including the risks, benefits and alternatives for the proposed anesthesia with the patient or authorized representative who has indicated his/her understanding and acceptance.     Dental advisory given  Plan Discussed with: CRNA and Surgeon  Anesthesia Plan Comments:       Anesthesia Quick Evaluation

## 2020-12-08 NOTE — Anesthesia Postprocedure Evaluation (Signed)
Anesthesia Post Note  Patient: Annette Saunders  Procedure(s) Performed: COLONOSCOPY WITH PROPOFOL (N/A ) ESOPHAGOGASTRODUODENOSCOPY (EGD) WITH PROPOFOL (N/A ) BALLOON DILATION (N/A ) BIOPSY POLYPECTOMY  Patient location during evaluation: Phase II Anesthesia Type: General Level of consciousness: awake and alert and oriented Pain management: pain level controlled Vital Signs Assessment: post-procedure vital signs reviewed and stable Respiratory status: spontaneous breathing and respiratory function stable Cardiovascular status: blood pressure returned to baseline and stable Postop Assessment: no apparent nausea or vomiting Anesthetic complications: no   No complications documented.   Last Vitals:  Vitals:   12/08/20 1033 12/08/20 1035  BP:    Pulse:  98  Resp: 18 18  Temp:    SpO2:  100%    Last Pain:  Vitals:   12/08/20 1033  TempSrc:   PainSc: 0-No pain                 Coron Rossano C Zyion Doxtater

## 2020-12-10 ENCOUNTER — Other Ambulatory Visit (INDEPENDENT_AMBULATORY_CARE_PROVIDER_SITE_OTHER): Payer: Self-pay | Admitting: Internal Medicine

## 2020-12-10 DIAGNOSIS — F419 Anxiety disorder, unspecified: Secondary | ICD-10-CM

## 2020-12-10 LAB — SURGICAL PATHOLOGY

## 2020-12-14 ENCOUNTER — Encounter (HOSPITAL_COMMUNITY): Payer: Self-pay | Admitting: Internal Medicine

## 2020-12-14 ENCOUNTER — Telehealth: Payer: Self-pay | Admitting: Internal Medicine

## 2020-12-14 ENCOUNTER — Telehealth: Payer: Self-pay

## 2020-12-14 NOTE — Telephone Encounter (Signed)
Also sent Dr. Abbey Chatters a note regarding this pt

## 2020-12-14 NOTE — Telephone Encounter (Signed)
Pt was calling to check on her procedure results. [phone]

## 2020-12-14 NOTE — Telephone Encounter (Signed)
Good Morning,  this pt is calling wanting her results. This is the 2nd time she has called.

## 2020-12-14 NOTE — Telephone Encounter (Signed)
Phoned and LMOVM advising the pt that Dr. Abbey Chatters has not sent me her results yet but when he does I will call her.

## 2020-12-15 ENCOUNTER — Telehealth: Payer: Self-pay | Admitting: Orthopaedic Surgery

## 2020-12-15 ENCOUNTER — Telehealth: Payer: Self-pay | Admitting: Internal Medicine

## 2020-12-15 NOTE — Telephone Encounter (Signed)
Please call patient when her biopsy results are back

## 2020-12-15 NOTE — Telephone Encounter (Signed)
Spoke with pt.  Made her aware of results and recommendations.  Pt voiced understanding.

## 2020-12-15 NOTE — Telephone Encounter (Signed)
Spoke with patient about a request form for records from her previous orthopaedic surgeon related to knee surgery in [place]. Records provider [employer], ph# [phone] and fax# [phone], returned the request x2, due to further information needed. I discussed with patient; advised we may need to complete one of our [place] request forms as well, and relayed for patient to provide as much of the requested information as possible by her appointment scheduled for 12/17/20. Aware.

## 2020-12-16 ENCOUNTER — Other Ambulatory Visit: Payer: Self-pay

## 2020-12-16 ENCOUNTER — Ambulatory Visit (INDEPENDENT_AMBULATORY_CARE_PROVIDER_SITE_OTHER): Payer: Medicare Other | Admitting: Internal Medicine

## 2020-12-16 ENCOUNTER — Encounter (INDEPENDENT_AMBULATORY_CARE_PROVIDER_SITE_OTHER): Payer: Self-pay | Admitting: Internal Medicine

## 2020-12-16 VITALS — BP 136/82 | HR 110 | Temp 97.5°F | Ht 61.0 in | Wt 203.4 lb

## 2020-12-16 DIAGNOSIS — E039 Hypothyroidism, unspecified: Secondary | ICD-10-CM | POA: Diagnosis not present

## 2020-12-16 DIAGNOSIS — I10 Essential (primary) hypertension: Secondary | ICD-10-CM

## 2020-12-16 DIAGNOSIS — E669 Obesity, unspecified: Secondary | ICD-10-CM

## 2020-12-16 MED ORDER — RYBELSUS 3 MG PO TABS: 3.0000 mg | ORAL_TABLET | Freq: Every day | ORAL | 3 refills | Status: DC

## 2020-12-16 NOTE — Progress Notes (Signed)
Metrics: Intervention Frequency ACO  Documented Smoking Status Yearly  Screened one or more times in 24 months  Cessation Counseling or  Active cessation medication Past 24 months  Past 24 months   Guideline developer: UpToDate (See UpToDate for funding source) Date Released: 2014       Wellness Office Visit  Subjective:  Patient ID: Annette Saunders, female    DOB: 1945/12/23  Age: 75 y.o. MRN: [id number]  CC: This lady comes in for follow-up of hypothyroidism, hypertension, obesity. HPI  She is concerned about her obesity and really wants to lose weight.  She usually does intermittent fasting almost on a daily basis and tries to eat healthy but she is not losing weight.  We had tried prescription of Saxenda but this was too expensive and her insurance did not pay. She continues on NP thyroid as before and her T3 levels were in a good range last time they were checked. When we did blood work on the last visit, her calcium level was slightly elevated. She continues to have lower quadrant abdominal discomfort and she has been extensively investigated for this.  No obvious cause has been found.  However, it is not seemingly getting worse and I think she has learned to live with it. Past Medical History:  Diagnosis Date  . Anxiety   . Essential hypertension, benign 06/06/2019  . Generalized osteoarthritis   . GERD (gastroesophageal reflux disease)   . Hyperlipidemia   . Hypothyroidism, adult 06/06/2019  . Insomnia   . Malaise and fatigue 06/06/2019  . Obesity (BMI 30.0-34.9) 06/06/2019   Past Surgical History:  Procedure Laterality Date  . BALLOON DILATION N/A 12/08/2020   Procedure: BALLOON DILATION;  Surgeon: Eloise Harman, DO;  [facility];  Service: Endoscopy;  Laterality: N/A;  . BIOPSY  12/08/2020   Procedure: BIOPSY;  Surgeon: Eloise Harman, DO;  [facility];  Service: Endoscopy;;  gastric  . COLONOSCOPY  10/2014   Dr. Cristine Polio, outside hospital.  Colonoscopy performed with Propofol, no polyps, small to medium size internal hemorrhoids noted.   . COLONOSCOPY WITH PROPOFOL N/A 12/08/2020   Procedure: COLONOSCOPY WITH PROPOFOL;  Surgeon: Eloise Harman, DO;  [facility];  Service: Endoscopy;  Laterality: N/A;  am appt  . ECTOPIC PREGNANCY SURGERY    . ELBOW FRACTURE SURGERY    . ESOPHAGOGASTRODUODENOSCOPY (EGD) WITH PROPOFOL N/A 12/08/2020   Procedure: ESOPHAGOGASTRODUODENOSCOPY (EGD) WITH PROPOFOL;  Surgeon: Eloise Harman, DO;  [facility];  Service: Endoscopy;  Laterality: N/A;  . HIATAL HERNIA REPAIR    . JOINT REPLACEMENT     bilateral knee  . LIPOMA EXCISION Right 08/26/2020   Procedure: MINOR EXCISION LIPOMA;THIGH;  Surgeon: Virl Cagey, MD;  [facility]: [facility];  Service: General;  Laterality: Right;  . POLYPECTOMY  12/08/2020   Procedure: POLYPECTOMY;  Surgeon: Eloise Harman, DO;  [facility];  Service: Endoscopy;;  . TONSILLECTOMY       Family History  Problem Relation Age of Onset  . Colon cancer Neg Hx     Social History   Social History Narrative   Widow for 60 years-husband died of MI.Lives alone.   Social History   Tobacco Use  . Smoking status: Never Smoker  . Smokeless tobacco: Never Used  Substance Use Topics  . Alcohol use: No    Alcohol/week: 0.0 standard drinks    Current Meds  Medication Sig  . Cholecalciferol (VITAMIN D3) 50 MCG (  2000 UT) capsule Take 2,000 Units by mouth daily.  . cyclobenzaprine (FLEXERIL) 10 MG tablet Take 10 mg by mouth as needed for muscle spasms.  [name] dicyclomine (BENTYL) 10 MG capsule Take 1 capsule (10 mg total) by mouth 4 (four) times daily -  before meals and at bedtime. For cramping as needed  . escitalopram (LEXAPRO) 10 MG tablet TAKE 1 TABLET BY MOUTH EVERY DAY  . esomeprazole (NEXIUM) 40 MG capsule Take 1 capsule (40 mg total) by mouth 2 (two) times daily before a meal. 30 minutes before meal  . famotidine (PEPCID) 20  MG tablet Take 20 mg by mouth at bedtime.  . NP THYROID 120 MG tablet TAKE 1 TABLET(120 MG) BY MOUTH TWICE DAILY (Patient taking differently: Take 120 mg by mouth in the morning and at bedtime.)  . NP THYROID 60 MG tablet TAKE 1 TABLET(60 MG) BY MOUTH DAILY BEFORE BREAKFAST  . progesterone (PROMETRIUM) 200 MG capsule TAKE 3 CAPSULES BY MOUTH EVERY NIGHT (Patient taking differently: Take 600 mg by mouth daily.)  . Semaglutide (RYBELSUS) 3 MG TABS Take 3 mg by mouth daily.     [place] Office Visit from 09/10/2020 in [facility]  PHQ-9 Total Score 0      Objective:   Today's Vitals: BP 136/82   Pulse (!) 110   Temp (!) 97.5 F (36.4 C) (Temporal)   Ht 5\' 1"  (1.549 m)   Wt 203 lb 6.4 oz (92.3 kg)   SpO2 98%   BMI 38.43 kg/m  Vitals with BMI 12/16/2020 12/08/2020 12/08/2020  Height 5\' 1"  - -  Weight 203 lbs 6 oz - -  BMI 38.88 - -  Systolic 280 - 034  Diastolic 82 - 917  Pulse 915 98 122     Physical Exam  She remains obese.  Blood pressure somewhat acceptable compared to last time.  She has gained weight since the last time I saw her.     Assessment   1. Hypothyroidism, adult   2. Obesity (BMI 30-39.9)   3. Essential hypertension, benign       Tests ordered Orders Placed This Encounter  Procedures  . COMPLETE METABOLIC PANEL WITH GFR     Plan: 1. She will continue with the same dose of NP thyroid 2. I am going to try her on Rybelsus as a GLP-1 agonist and because she has government insurance, she is unable to qualify for a coupon.  Hopefully, her insurance will pay for it.  If it does not, I do not see many good options. 3. Follow-up in about 4 months.   Meds ordered this encounter  Medications  . Semaglutide (RYBELSUS) 3 MG TABS    Sig: Take 3 mg by mouth daily.    Dispense:  30 tablet    Refill:  3    Glinda Natzke Luther Parody, MD

## 2020-12-17 ENCOUNTER — Ambulatory Visit: Payer: Medicare Other | Admitting: Orthopaedic Surgery

## 2020-12-17 LAB — COMPLETE METABOLIC PANEL WITH GFR
AG Ratio: 1.4 (calc) (ref 1.0–2.5)
ALT: 9 U/L (ref 6–29)
AST: 18 U/L (ref 10–35)
Albumin: 3.8 g/dL (ref 3.6–5.1)
Alkaline phosphatase (APISO): 109 U/L (ref 37–153)
BUN: 17 mg/dL (ref 7–25)
CO2: 25 mmol/L (ref 20–32)
Calcium: 9.7 mg/dL (ref 8.6–10.4)
Chloride: 106 mmol/L (ref 98–110)
Creat: 0.87 mg/dL (ref 0.60–0.93)
GFR, Est African American: 76 mL/min/{1.73_m2} (ref >=60)
GFR, Est Non African American: 66 mL/min/{1.73_m2} (ref >=60)
Globulin: 2.8 g/dL (calc) (ref 1.9–3.7)
Glucose, Bld: 103 mg/dL (ref 65–139)
Potassium: 3.9 mmol/L (ref 3.5–5.3)
Sodium: 141 mmol/L (ref 135–146)
Total Bilirubin: 0.3 mg/dL (ref 0.2–1.2)
Total Protein: 6.6 g/dL (ref 6.1–8.1)

## 2020-12-18 ENCOUNTER — Other Ambulatory Visit (INDEPENDENT_AMBULATORY_CARE_PROVIDER_SITE_OTHER): Payer: Self-pay | Admitting: Internal Medicine

## 2020-12-23 ENCOUNTER — Telehealth: Payer: Self-pay | Admitting: Orthopaedic Surgery

## 2020-12-23 NOTE — Telephone Encounter (Signed)
Patient called to relay that she has received records which she requested from her previous orthopaedic surgeon. Aware I was unable to hear her well on her cell phone - aware to bring records to office for Dr Annette Saunders to review prior to her next appointment; Dr Annette Saunders aware records are to follow.

## 2020-12-30 ENCOUNTER — Ambulatory Visit (INDEPENDENT_AMBULATORY_CARE_PROVIDER_SITE_OTHER): Payer: Medicare Other | Admitting: Internal Medicine

## 2020-12-30 ENCOUNTER — Encounter (INDEPENDENT_AMBULATORY_CARE_PROVIDER_SITE_OTHER): Payer: Self-pay | Admitting: Internal Medicine

## 2020-12-30 ENCOUNTER — Other Ambulatory Visit: Payer: Self-pay

## 2020-12-30 VITALS — BP 134/84 | HR 95 | Temp 97.5°F | Ht 61.0 in | Wt 205.2 lb

## 2020-12-30 DIAGNOSIS — N3000 Acute cystitis without hematuria: Secondary | ICD-10-CM

## 2020-12-30 DIAGNOSIS — E039 Hypothyroidism, unspecified: Secondary | ICD-10-CM

## 2020-12-30 DIAGNOSIS — E669 Obesity, unspecified: Secondary | ICD-10-CM | POA: Diagnosis not present

## 2020-12-30 MED ORDER — NP THYROID 60 MG PO TABS: 60.0000 mg | ORAL_TABLET | Freq: Two times a day (BID) | ORAL | 1 refills | Status: DC

## 2020-12-30 MED ORDER — CIPROFLOXACIN HCL 500 MG PO TABS: 500.0000 mg | ORAL_TABLET | Freq: Two times a day (BID) | ORAL | 0 refills | Status: DC

## 2020-12-30 NOTE — Progress Notes (Signed)
Metrics: Intervention Frequency ACO  Documented Smoking Status Yearly  Screened one or more times in 24 months  Cessation Counseling or  Active cessation medication Past 24 months  Past 24 months   Guideline developer: UpToDate (See UpToDate for funding source) Date Released: 2014       Wellness Office Visit  Subjective:  Patient ID: Annette Saunders, female    DOB: 1946/06/03  Age: 75 y.o. MRN: [id number]  CC: This lady comes in because she thinks she may have a UTI.  She is also concerned about fatigue still. HPI  She has hypothyroidism and is on good doses of NP thyroid.  However she still feels fatigued. She also has urinary frequency and dysuria in the last couple of days. Past Medical History:  Diagnosis Date  . Anxiety   . Essential hypertension, benign 06/06/2019  . Generalized osteoarthritis   . GERD (gastroesophageal reflux disease)   . Hyperlipidemia   . Hypothyroidism, adult 06/06/2019  . Insomnia   . Malaise and fatigue 06/06/2019  . Obesity (BMI 30.0-34.9) 06/06/2019   Past Surgical History:  Procedure Laterality Date  . BALLOON DILATION N/A 12/08/2020   Procedure: BALLOON DILATION;  Surgeon: Eloise Harman, DO;  [facility];  Service: Endoscopy;  Laterality: N/A;  . BIOPSY  12/08/2020   Procedure: BIOPSY;  Surgeon: Eloise Harman, DO;  [facility];  Service: Endoscopy;;  gastric  . COLONOSCOPY  10/2014   Dr. Cristine Polio, outside hospital. Colonoscopy performed with Propofol, no polyps, small to medium size internal hemorrhoids noted.   . COLONOSCOPY WITH PROPOFOL N/A 12/08/2020   Procedure: COLONOSCOPY WITH PROPOFOL;  Surgeon: Eloise Harman, DO;  [facility];  Service: Endoscopy;  Laterality: N/A;  am appt  . ECTOPIC PREGNANCY SURGERY    . ELBOW FRACTURE SURGERY    . ESOPHAGOGASTRODUODENOSCOPY (EGD) WITH PROPOFOL N/A 12/08/2020   Procedure: ESOPHAGOGASTRODUODENOSCOPY (EGD) WITH PROPOFOL;  Surgeon: Eloise Harman, DO;   [facility];  Service: Endoscopy;  Laterality: N/A;  . HIATAL HERNIA REPAIR    . JOINT REPLACEMENT     bilateral knee  . LIPOMA EXCISION Right 08/26/2020   Procedure: MINOR EXCISION LIPOMA;THIGH;  Surgeon: Virl Cagey, MD;  [facility];  Service: General;  Laterality: Right;  . POLYPECTOMY  12/08/2020   Procedure: POLYPECTOMY;  Surgeon: Eloise Harman, DO;  [facility];  Service: Endoscopy;;  . TONSILLECTOMY       Family History  Problem Relation Age of Onset  . Colon cancer Neg Hx     Social History   Social History Narrative   Widow for 56 years-husband died of MI.Lives alone.   Social History   Tobacco Use  . Smoking status: Never Smoker  . Smokeless tobacco: Never Used  Substance Use Topics  . Alcohol use: No    Alcohol/week: 0.0 standard drinks    Current Meds  Medication Sig  . Cholecalciferol (VITAMIN D3) 50 MCG (2000 UT) capsule Take 2,000 Units by mouth daily.  . ciprofloxacin (CIPRO) 500 MG tablet Take 1 tablet (500 mg total) by mouth 2 (two) times daily.  . cyclobenzaprine (FLEXERIL) 10 MG tablet Take 10 mg by mouth as needed for muscle spasms.  [name] escitalopram (LEXAPRO) 10 MG tablet TAKE 1 TABLET BY MOUTH EVERY DAY  . esomeprazole (NEXIUM) 40 MG capsule Take 1 capsule (40 mg total) by mouth 2 (two) times daily before a meal. 30 minutes before meal  . famotidine (PEPCID)  20 MG tablet Take 20 mg by mouth at bedtime.  . NP THYROID 120 MG tablet TAKE 1 TABLET(120 MG) BY MOUTH TWICE DAILY (Patient taking differently: Take 120 mg by mouth in the morning and at bedtime.)  . progesterone (PROMETRIUM) 200 MG capsule TAKE 3 CAPSULES BY MOUTH EVERY NIGHT (Patient taking differently: Take 600 mg by mouth daily.)  . traMADol (ULTRAM) 50 MG tablet TAKE 1 TABLET(50 MG) BY MOUTH EVERY 8 HOURS FOR UP TO 5 DAYS AS NEEDED  . [DISCONTINUED] NP THYROID 60 MG tablet TAKE 1 TABLET(60 MG) BY MOUTH DAILY BEFORE BREAKFAST  . [DISCONTINUED]  Semaglutide (RYBELSUS) 3 MG TABS Take 3 mg by mouth daily.     [place] Office Visit from 09/10/2020 in [facility]  PHQ-9 Total Score 0      Objective:   Today's Vitals: BP 134/84   Pulse 95   Temp (!) 97.5 F (36.4 C) (Temporal)   Ht 5\' 1"  (1.549 m)   Wt 205 lb 3.2 oz (93.1 kg)   SpO2 98%   BMI 38.77 kg/m  Vitals with BMI 12/30/2020 12/16/2020 12/08/2020  Height 5\' 1"  5\' 1"  -  Weight 205 lbs 3 oz 203 lbs 6 oz -  BMI 42.59 56.38 -  Systolic 756 433 -  Diastolic 84 82 -  Pulse 95 110 98     Physical Exam  She remains obese.  She has not lost any weight.  Blood pressure is reasonable.     Assessment   1. Acute cystitis without hematuria   2. Hypothyroidism, adult   3. Obesity (BMI 30-39.9)       Tests ordered Orders Placed This Encounter  Procedures  . Urinalysis w microscopic + reflex cultur     Plan: 1. We will send the urine for culture and urinalysis and I will start her empirically on ciprofloxacin. 2. I think we can optimize thyroid further and she will now take NP thyroid 120 mg twice a day along with NP thyroid 60 mg twice a day.  I think this is probably the maximum dose of thyroid I can probably go to.  As long she does not have any side effects. 3. She will focus on nutrition and she will drink 100 ounces of water daily, do intermittent fasting and eat more of a plant-based diet.  Unfortunately, Rybelsus was too expensive and she cannot afford this. 4. Follow-up as scheduled in August.   Meds ordered this encounter  Medications  . NP THYROID 60 MG tablet    Sig: Take 1 tablet (60 mg total) by mouth 2 (two) times daily.    Dispense:  180 tablet    Refill:  1    ZERO refills remain on this prescription. Your patient is requesting advance approval of refills for this medication to [facility]  . ciprofloxacin (CIPRO) 500 MG tablet    Sig: Take 1 tablet (500 mg total) by mouth 2 (two) times daily.    Dispense:  6 tablet     Refill:  0    Faduma Cho Luther Parody, MD

## 2021-01-01 ENCOUNTER — Other Ambulatory Visit (INDEPENDENT_AMBULATORY_CARE_PROVIDER_SITE_OTHER): Payer: Self-pay | Admitting: Internal Medicine

## 2021-01-02 LAB — URINALYSIS W MICROSCOPIC + REFLEX CULTURE
Bilirubin Urine: NEGATIVE
Glucose, UA: NEGATIVE
Ketones, ur: NEGATIVE
Nitrites, Initial: NEGATIVE
Specific Gravity, Urine: 1.031 (ref 1.001–1.035)
WBC, UA: >=60 /HPF — AB (ref 0–5)
pH: 6.5 (ref 5.0–8.0)

## 2021-01-02 LAB — URINE CULTURE

## 2021-01-02 LAB — CULTURE INDICATED

## 2021-01-06 ENCOUNTER — Telehealth (INDEPENDENT_AMBULATORY_CARE_PROVIDER_SITE_OTHER): Payer: Self-pay

## 2021-01-06 NOTE — Telephone Encounter (Signed)
Patient called and stated that she started having bad breast pain the day after she started taking her new dosage of her thyroid medication and she also started having headaches last week. Patient is not sure if this could be from the thyroid medication and wanted to ask you and find out what she needs to do. Please advise.

## 2021-01-06 NOTE — Telephone Encounter (Signed)
Called patient and gave her the message from Dr. Anastasio Champion. Patient verbalized an understanding and will call us on Monday to let us know how she is feeling.

## 2021-01-06 NOTE — Telephone Encounter (Signed)
Thyroid would not cause the breast pain but it could cause headaches.  She should go back to taking the previous dose of thyroid and see what happens.  If she continues to have symptoms into next week, I should probably see her in the office.

## 2021-01-12 ENCOUNTER — Other Ambulatory Visit: Payer: Self-pay

## 2021-01-12 ENCOUNTER — Ambulatory Visit: Payer: Medicare Other

## 2021-01-12 ENCOUNTER — Encounter: Payer: Self-pay | Admitting: Orthopaedic Surgery

## 2021-01-12 ENCOUNTER — Ambulatory Visit (INDEPENDENT_AMBULATORY_CARE_PROVIDER_SITE_OTHER): Payer: Medicare Other | Admitting: Orthopaedic Surgery

## 2021-01-12 VITALS — BP 141/94 | HR 104 | Ht 61.0 in | Wt 205.8 lb

## 2021-01-12 DIAGNOSIS — Z96651 Presence of right artificial knee joint: Secondary | ICD-10-CM

## 2021-01-12 DIAGNOSIS — M25561 Pain in right knee: Secondary | ICD-10-CM | POA: Diagnosis not present

## 2021-01-12 DIAGNOSIS — G8929 Other chronic pain: Secondary | ICD-10-CM

## 2021-01-12 NOTE — Addendum Note (Signed)
Addended by: Derek Mound A on: 01/12/2021 02:26 PM   Modules accepted: Orders

## 2021-01-12 NOTE — Progress Notes (Signed)
Patient Annette Saunders, female DOB:1946-02-16, 75 y.o. WPY:[id number]  Chief Complaint  Patient presents with  . Knee Pain    RIGHT    HPI  Illiana Saunders is a 75 y.o. female who has a painful total knee on the right.  She has popping at times. She has no trauma, no redness.  It hurts more after being up on it a while or after walking a lot. She has no trauma.  She brings in copies of her knee surgery operative reports done in 2008.  She travels often to [place] and [place].  Her pain is getting slowly worse.   Body mass index is 38.89 kg/m.  ROS  Review of Systems  Constitutional: Positive for activity change.       Patient does not have diabetes Patient has hypertension Patient does not have COPD Patient does not smoke.  HENT: Negative for congestion.   Respiratory: Negative for cough and shortness of breath.   Cardiovascular: Negative for chest pain.  Musculoskeletal: Positive for back pain, gait problem, myalgias and neck pain.  Allergic/Immunologic: Negative for environmental allergies.  Psychiatric/Behavioral: The patient is nervous/anxious.   All other systems reviewed and are negative.   All other systems reviewed and are negative.  The following is a summary of the past history medically, past history surgically, known current medicines, social history and family history.  This information is gathered electronically by the computer from prior information and documentation.  I review this each visit and have found including this information at this point in the chart is beneficial and informative.    Past Medical History:  Diagnosis Date  . Anxiety   . Essential hypertension, benign 06/06/2019  . Generalized osteoarthritis   . GERD (gastroesophageal reflux disease)   . Hyperlipidemia   . Hypothyroidism, adult 06/06/2019  . Insomnia   . Malaise and fatigue 06/06/2019  . Obesity (BMI 30.0-34.9) 06/06/2019    Past Surgical History:  Procedure  Laterality Date  . BALLOON DILATION N/A 12/08/2020   Procedure: BALLOON DILATION;  Surgeon: Eloise Harman, DO;  [facility];  Service: Endoscopy;  Laterality: N/A;  . BIOPSY  12/08/2020   Procedure: BIOPSY;  Surgeon: Eloise Harman, DO;  [facility];  Service: Endoscopy;;  gastric  . COLONOSCOPY  10/2014   Dr. Cristine Polio, outside hospital. Colonoscopy performed with Propofol, no polyps, small to medium size internal hemorrhoids noted.   . COLONOSCOPY WITH PROPOFOL N/A 12/08/2020   Procedure: COLONOSCOPY WITH PROPOFOL;  Surgeon: Eloise Harman, DO;  [facility];  Service: Endoscopy;  Laterality: N/A;  am appt  . ECTOPIC PREGNANCY SURGERY    . ELBOW FRACTURE SURGERY    . ESOPHAGOGASTRODUODENOSCOPY (EGD) WITH PROPOFOL N/A 12/08/2020   Procedure: ESOPHAGOGASTRODUODENOSCOPY (EGD) WITH PROPOFOL;  Surgeon: Eloise Harman, DO;  [facility];  Service: Endoscopy;  Laterality: N/A;  . HIATAL HERNIA REPAIR    . JOINT REPLACEMENT     bilateral knee  . LIPOMA EXCISION Right 08/26/2020   Procedure: MINOR EXCISION LIPOMA;THIGH;  Surgeon: Virl Cagey, MD;  [facility];  Service: General;  Laterality: Right;  . POLYPECTOMY  12/08/2020   Procedure: POLYPECTOMY;  Surgeon: Eloise Harman, DO;  [facility];  Service: Endoscopy;;  . TONSILLECTOMY      Family History  Problem Relation Age of Onset  . Colon cancer Neg Hx     Social History Social History   Tobacco Use  .  Smoking status: Never Smoker  . Smokeless tobacco: Never Used  Substance Use Topics  . Alcohol use: No    Alcohol/week: 0.0 standard drinks  . Drug use: No    Allergies  Allergen Reactions  . Dilantin [Phenytoin Sodium Extended] Other (See Comments)    Hallucinations if large amounts  . Doxycycline Hyclate     Blurry vision     Current Outpatient Medications  Medication Sig Dispense Refill  . Cholecalciferol (VITAMIN D3) 50 MCG (2000 UT) capsule  Take 2,000 Units by mouth daily.    . ciprofloxacin (CIPRO) 500 MG tablet Take 1 tablet (500 mg total) by mouth 2 (two) times daily. 6 tablet 0  . cyclobenzaprine (FLEXERIL) 10 MG tablet Take 10 mg by mouth as needed for muscle spasms.    [name] escitalopram (LEXAPRO) 10 MG tablet TAKE 1 TABLET BY MOUTH EVERY DAY 90 tablet 0  . esomeprazole (NEXIUM) 40 MG capsule Take 1 capsule (40 mg total) by mouth 2 (two) times daily before a meal. 30 minutes before meal 60 capsule 3  . famotidine (PEPCID) 20 MG tablet Take 20 mg by mouth at bedtime.    . NP THYROID 120 MG tablet TAKE 1 TABLET(120 MG) BY MOUTH TWICE DAILY (Patient taking differently: Take 120 mg by mouth in the morning and at bedtime.) 180 tablet 1  . NP THYROID 60 MG tablet Take 1 tablet (60 mg total) by mouth 2 (two) times daily. 180 tablet 1  . progesterone (PROMETRIUM) 200 MG capsule TAKE 3 CAPSULES BY MOUTH EVERY NIGHT 90 capsule 3  . traMADol (ULTRAM) 50 MG tablet TAKE 1 TABLET(50 MG) BY MOUTH EVERY 8 HOURS FOR UP TO 5 DAYS AS NEEDED 15 tablet 2   No current facility-administered medications for this visit.     Physical Exam  Blood pressure (!) 141/94, pulse (!) 104, height 5\' 1"  (1.549 m), weight 205 lb 12.8 oz (93.4 kg).  Constitutional: overall normal hygiene, normal nutrition, well developed, normal grooming, normal body habitus. Assistive device:none  Musculoskeletal: gait and station Limp right, muscle tone and strength are normal, no tremors or atrophy is present.  .  Neurological: coordination overall normal.  Deep tendon reflex/nerve stretch intact.  Sensation normal.  Cranial nerves II-XII intact.   Skin:   Normal overall no scars, lesions, ulcers or rashes. No psoriasis.  Psychiatric: Alert and oriented x 3.  Recent memory intact, remote memory unclear.  Normal mood and affect. Well groomed.  Good eye contact.  Cardiovascular: overall no swelling, no varicosities, no edema bilaterally, normal temperatures of the legs and  arms, no clubbing, cyanosis and good capillary refill.  Lymphatic: palpation is normal.  She has well healed scar of the right knee anteriorly, she has some crepitus, she has no effusion, no redness, very slight limp on the right.  NV intact.  No distal edema.  All other systems reviewed and are negative   The patient has been educated about the nature of the problem(s) and counseled on treatment options.  The patient appeared to understand what I have discussed and is in agreement with it.  Encounter Diagnosis  Name Primary?  . Chronic knee pain after total replacement of right knee joint Yes  X-rays of the right knee knee were done, reported separately.   PLAN Call if any problems.  Precautions discussed.  Continue current medications.   Return to clinic to see Dr. Maureen Ralphs for further evaluation per patient request.   I have also ordered CBC, sed rate,  C-reactive protein.  Electronically Signed Sanjuana Kava, MD 5/17/202210:41 AM

## 2021-01-13 LAB — CBC WITH DIFFERENTIAL/PLATELET
Absolute Monocytes: 638 cells/uL (ref 200–950)
Basophils Absolute: 61 cells/uL (ref 0–200)
Basophils Relative: 0.8 %
Eosinophils Absolute: 266 cells/uL (ref 15–500)
Eosinophils Relative: 3.5 %
HCT: 39.5 % (ref 35.0–45.0)
Hemoglobin: 12.7 g/dL (ref 11.7–15.5)
Lymphs Abs: 2873 cells/uL (ref 850–3900)
MCH: 26.6 pg — ABNORMAL LOW (ref 27.0–33.0)
MCHC: 32.2 g/dL (ref 32.0–36.0)
MCV: 82.6 fL (ref 80.0–100.0)
MPV: 10 fL (ref 7.5–12.5)
Monocytes Relative: 8.4 %
Neutro Abs: 3762 cells/uL (ref 1500–7800)
Neutrophils Relative %: 49.5 %
Platelets: 381 10*3/uL (ref 140–400)
RBC: 4.78 10*6/uL (ref 3.80–5.10)
RDW: 13.2 % (ref 11.0–15.0)
Total Lymphocyte: 37.8 %
WBC: 7.6 10*3/uL (ref 3.8–10.8)

## 2021-01-13 LAB — SEDIMENTATION RATE: Sed Rate: 48 mm/h — ABNORMAL HIGH (ref 0–30)

## 2021-01-13 LAB — C-REACTIVE PROTEIN: CRP: 4.5 mg/L (ref <8.0)

## 2021-01-15 ENCOUNTER — Other Ambulatory Visit (HOSPITAL_COMMUNITY): Payer: Self-pay | Admitting: Internal Medicine

## 2021-01-15 DIAGNOSIS — Z1231 Encounter for screening mammogram for malignant neoplasm of breast: Secondary | ICD-10-CM

## 2021-01-16 ENCOUNTER — Other Ambulatory Visit (INDEPENDENT_AMBULATORY_CARE_PROVIDER_SITE_OTHER): Payer: Self-pay | Admitting: Internal Medicine

## 2021-01-24 ENCOUNTER — Other Ambulatory Visit: Payer: Self-pay | Admitting: Gastroenterology

## 2021-01-27 ENCOUNTER — Ambulatory Visit (HOSPITAL_COMMUNITY): Payer: Medicare Other

## 2021-01-30 ENCOUNTER — Other Ambulatory Visit: Payer: Self-pay | Admitting: Gastroenterology

## 2021-02-01 ENCOUNTER — Other Ambulatory Visit: Payer: Self-pay | Admitting: Gastroenterology

## 2021-02-04 ENCOUNTER — Ambulatory Visit (HOSPITAL_COMMUNITY): Payer: Medicare Other

## 2021-02-05 ENCOUNTER — Other Ambulatory Visit: Payer: Self-pay

## 2021-02-05 ENCOUNTER — Ambulatory Visit (HOSPITAL_COMMUNITY)
Admission: RE | Admit: 2021-02-05 | Discharge: 2021-02-05 | Disposition: A | Discharge status: Home / Self Care | Payer: Medicare Other | Source: Ambulatory Visit | Attending: Internal Medicine | Admitting: Internal Medicine

## 2021-02-05 DIAGNOSIS — Z1231 Encounter for screening mammogram for malignant neoplasm of breast: Secondary | ICD-10-CM | POA: Insufficient documentation

## 2021-02-16 ENCOUNTER — Encounter (INDEPENDENT_AMBULATORY_CARE_PROVIDER_SITE_OTHER): Payer: Self-pay | Admitting: Internal Medicine

## 2021-02-16 ENCOUNTER — Ambulatory Visit (INDEPENDENT_AMBULATORY_CARE_PROVIDER_SITE_OTHER): Payer: Medicare Other | Admitting: Internal Medicine

## 2021-02-16 ENCOUNTER — Other Ambulatory Visit: Payer: Self-pay

## 2021-02-16 VITALS — BP 122/78 | HR 111 | Temp 97.5°F | Ht 61.0 in | Wt 204.0 lb

## 2021-02-16 DIAGNOSIS — I10 Essential (primary) hypertension: Secondary | ICD-10-CM

## 2021-02-16 DIAGNOSIS — E039 Hypothyroidism, unspecified: Secondary | ICD-10-CM

## 2021-02-16 DIAGNOSIS — R1031 Right lower quadrant pain: Secondary | ICD-10-CM | POA: Diagnosis not present

## 2021-02-16 DIAGNOSIS — R5383 Other fatigue: Secondary | ICD-10-CM | POA: Diagnosis not present

## 2021-02-16 DIAGNOSIS — R5381 Other malaise: Secondary | ICD-10-CM

## 2021-02-16 DIAGNOSIS — E669 Obesity, unspecified: Secondary | ICD-10-CM | POA: Diagnosis not present

## 2021-02-16 DIAGNOSIS — E2839 Other primary ovarian failure: Secondary | ICD-10-CM | POA: Diagnosis not present

## 2021-02-16 MED ORDER — TRAMADOL HCL 50 MG PO TABS: 50.0000 mg | ORAL_TABLET | Freq: Four times a day (QID) | ORAL | 2 refills | Status: DC | PRN

## 2021-02-16 MED ORDER — RYBELSUS 3 MG PO TABS: 3.0000 mg | ORAL_TABLET | Freq: Every day | ORAL | 3 refills | Status: DC

## 2021-02-16 NOTE — Progress Notes (Signed)
Metrics: Intervention Frequency ACO  Documented Smoking Status Yearly  Screened one or more times in 24 months  Cessation Counseling or  Active cessation medication Past 24 months  Past 24 months   Guideline developer: UpToDate (See UpToDate for funding source) Date Released: 2014       Wellness Office Visit  Subjective:  Patient ID: Annette Saunders, female    DOB: [birth year]  Age: 75 y.o. MRN: [id number]  CC: Right groin pain HPI  This lady comes in as an acute visit with right groin pain which she has had before and we have investigated it.  It is in the right inguinal area.  Several modalities of investigation of fail to show the etiology.  She does say that tramadol does seem to help with the pain.  The pain, which was initially all day long, now only seems to occur at night when she lies down in bed. She also feels very tired all the time despite being on very high levels of NP thyroid.  She takes progesterone at night which does help her sleep well.  We have not investigated other hormones before with her. She is also frustrated at her obesity and not being able to lose weight.  We tried her on a prescription of Saxenda but the insurance denied this.  She is willing to try any other medications that her insurance will pay for. Past Medical History:  Diagnosis Date   Anxiety    Essential hypertension, benign 06/06/2019   Generalized osteoarthritis    GERD (gastroesophageal reflux disease)    Hyperlipidemia    Hypothyroidism, adult 06/06/2019   Insomnia    Malaise and fatigue 06/06/2019   Obesity (BMI 30.0-34.9) 06/06/2019   Past Surgical History:  Procedure Laterality Date   BALLOON DILATION N/A 12/08/2020   Procedure: BALLOON DILATION;  Surgeon: Eloise Harman, DO;  [facility];  Service: Endoscopy;  Laterality: N/A;   BIOPSY  12/08/2020   Procedure: BIOPSY;  Surgeon: Eloise Harman, DO;  [facility];  Service: Endoscopy;;  gastric   COLONOSCOPY   10/2014   Dr. Cristine Polio, outside hospital. Colonoscopy performed with Propofol, no polyps, small to medium size internal hemorrhoids noted.    COLONOSCOPY WITH PROPOFOL N/A 12/08/2020   Procedure: COLONOSCOPY WITH PROPOFOL;  Surgeon: Eloise Harman, DO;  [facility];  Service: Endoscopy;  Laterality: N/A;  am appt   ECTOPIC PREGNANCY SURGERY     ELBOW FRACTURE SURGERY     ESOPHAGOGASTRODUODENOSCOPY (EGD) WITH PROPOFOL N/A 12/08/2020   Procedure: ESOPHAGOGASTRODUODENOSCOPY (EGD) WITH PROPOFOL;  Surgeon: Eloise Harman, DO;  [facility];  Service: Endoscopy;  Laterality: N/A;   HIATAL HERNIA REPAIR     JOINT REPLACEMENT     bilateral knee   LIPOMA EXCISION Right 08/26/2020   Procedure: MINOR EXCISION LIPOMA;THIGH;  Surgeon: Virl Cagey, MD;  [facility]: [facility];  Service: General;  Laterality: Right;   POLYPECTOMY  12/08/2020   Procedure: POLYPECTOMY;  Surgeon: Eloise Harman, DO;  [facility];  Service: Endoscopy;;   TONSILLECTOMY       Family History  Problem Relation Age of Onset   Colon cancer Neg Hx     Social History   Social History Narrative   Widow for 79 years-husband died of MI.Lives alone.   Social History   Tobacco Use   Smoking status: Never   Smokeless tobacco: Never  Substance Use Topics   Alcohol use: No  Alcohol/week: 0.0 standard drinks    Current Meds  Medication Sig   Cholecalciferol (VITAMIN D3) 50 MCG (2000 UT) capsule Take 2,000 Units by mouth daily.   cyclobenzaprine (FLEXERIL) 10 MG tablet TAKE 1 TABLET(10 MG) BY MOUTH THREE TIMES DAILY AS NEEDED FOR MUSCLE SPASMS. USE SPARINGLY DUE TO INCREASED FALL RISK   dicyclomine (BENTYL) 10 MG capsule TAKE 1 CAPSULE(10 MG) BY MOUTH TWICE DAILY AS NEEDED   escitalopram (LEXAPRO) 10 MG tablet TAKE 1 TABLET BY MOUTH EVERY DAY   esomeprazole (NEXIUM) 40 MG capsule TAKE ONE CAPSULE BY MOUTH TWICE DAILY 30 MINUTES BEFORE MEALS   famotidine (PEPCID) 20 MG tablet TAKE 1  TABLET(20 MG) BY MOUTH AT BEDTIME   NP THYROID 120 MG tablet TAKE 1 TABLET(120 MG) BY MOUTH TWICE DAILY (Patient taking differently: Take 120 mg by mouth in the morning and at bedtime.)   NP THYROID 60 MG tablet Take 1 tablet (60 mg total) by mouth 2 (two) times daily.   progesterone (PROMETRIUM) 200 MG capsule TAKE 3 CAPSULES BY MOUTH EVERY NIGHT   Semaglutide (RYBELSUS) 3 MG TABS Take 3 mg by mouth daily.   [DISCONTINUED] traMADol (ULTRAM) 50 MG tablet TAKE 1 TABLET(50 MG) BY MOUTH EVERY 8 HOURS FOR UP TO 5 DAYS AS NEEDED     Flowsheet Row Office Visit from 09/10/2020 in [facility]  PHQ-9 Total Score 0       Objective:   Today's Vitals: BP 122/78   Pulse (!) 111   Temp (!) 97.5 F (36.4 C) (Temporal)   Ht 5\' 1"  (1.549 m)   Wt 204 lb (92.5 kg)   SpO2 97%   BMI 38.55 kg/m  Vitals with BMI 02/16/2021 01/12/2021 12/30/2020  Height 5\' 1"  5\' 1"  5\' 1"   Weight 204 lbs 205 lbs 13 oz 205 lbs 3 oz  BMI 38.57 56.21 30.86  Systolic 578 469 629  Diastolic 78 94 84  Pulse 528 104 95     Physical Exam  She does not appear to be in any acute pain.  She remains obese.     Assessment   1. Essential hypertension, benign   2. Hypothyroidism, adult   3. Right inguinal pain   4. Malaise and fatigue   5. Primary ovarian failure   6. Obesity (BMI 30-39.9)       Tests ordered Orders Placed This Encounter  Procedures   DHEA-sulfate   Estradiol   Progesterone   T3, free   T4, free   TSH   Testos,Total,Free and SHBG (Female)      Plan: 1.  For the right inguinal pain, we will use tramadol as this seems to help her and she is only getting this at night now. 2.  Continue with NP thyroid for her hypothyroidism.  We will check levels today. 3.  I have sent a prescription of Rybelsus to see if insurance will approve this for help with her insulin resistance and obesity. 4.  We will check hormone levels also today and I will see her in about a month's time to review  everything with her.    Meds ordered this encounter  Medications   traMADol (ULTRAM) 50 MG tablet    Sig: Take 1 tablet (50 mg total) by mouth every 6 (six) hours as needed.    Dispense:  30 tablet    Refill:  2   Semaglutide (RYBELSUS) 3 MG TABS    Sig: Take 3 mg by mouth daily.  Dispense:  30 tablet    Refill:  3     Montrey Buist Luther Parody, MD

## 2021-02-17 ENCOUNTER — Ambulatory Visit (INDEPENDENT_AMBULATORY_CARE_PROVIDER_SITE_OTHER): Payer: Medicare Other | Admitting: Internal Medicine

## 2021-02-18 ENCOUNTER — Telehealth (INDEPENDENT_AMBULATORY_CARE_PROVIDER_SITE_OTHER): Payer: Self-pay

## 2021-02-18 NOTE — Telephone Encounter (Signed)
Pt was called  and made aware of the results notes.

## 2021-02-18 NOTE — Telephone Encounter (Signed)
Please let the patient know that her thyroid tests are good so continue with same dose of thyroid.  I am still awaiting for her testosterone levels before I messaged her with all of the results.  I will discuss this all in detail when I see her on the next visit in person.

## 2021-02-20 LAB — T3, FREE: T3, Free: 6.3 pg/mL — ABNORMAL HIGH (ref 2.3–4.2)

## 2021-02-20 LAB — PROGESTERONE: Progesterone: 50.9 ng/mL

## 2021-02-20 LAB — T4, FREE: Free T4: 1.7 ng/dL (ref 0.8–1.8)

## 2021-02-20 LAB — ESTRADIOL: Estradiol: 23 pg/mL

## 2021-02-20 LAB — TESTOS,TOTAL,FREE AND SHBG (FEMALE)
Free Testosterone: 2.1 pg/mL (ref 0.2–3.7)
Sex Hormone Binding: 126 nmol/L — ABNORMAL HIGH (ref 14–73)
Testosterone, Total, LC-MS-MS: 37 ng/dL (ref 2–45)

## 2021-02-20 LAB — DHEA-SULFATE: DHEA-SO4: 32 ug/dL (ref 4–157)

## 2021-02-20 LAB — TSH: TSH: <0.01 mIU/L — ABNORMAL LOW (ref 0.40–4.50)

## 2021-03-08 ENCOUNTER — Telehealth (INDEPENDENT_AMBULATORY_CARE_PROVIDER_SITE_OTHER): Payer: Self-pay

## 2021-03-08 NOTE — Progress Notes (Deleted)
history of GERD, dysphagia      Colonoscopy April 2022: fair prep, non-bleeding internal hemorrhoids, one 2 mm polyp in transverse colon (tubular adenoma). EGD with benign-appearing esophageal stenosis, s/p dilation, gastritis s/p biopsy, normal duodenum. Biopsy with mild hyperemia, negative H.pylori.

## 2021-03-08 NOTE — Telephone Encounter (Signed)
Called about the Rybelsus medication concerns & problems. So today she is having GI problems and is not feeling well. Cannot keep nothing down on stomach. Pt also stated it was costing her $225.00 to frill monthly.  So, Pt is now also having issue with eyes. She has been using tears drops , but ut is not helping. Felt like it was something in the eye. So ask her to go to urgent care since her tele med visits are hard to do on I-pad and phone due to her home location.

## 2021-03-09 ENCOUNTER — Ambulatory Visit: Payer: Medicare Other | Admitting: Gastroenterology

## 2021-03-11 ENCOUNTER — Other Ambulatory Visit: Payer: Self-pay | Admitting: Gastroenterology

## 2021-03-11 ENCOUNTER — Other Ambulatory Visit (INDEPENDENT_AMBULATORY_CARE_PROVIDER_SITE_OTHER): Payer: Self-pay | Admitting: Internal Medicine

## 2021-03-11 DIAGNOSIS — F419 Anxiety disorder, unspecified: Secondary | ICD-10-CM

## 2021-03-16 ENCOUNTER — Encounter (INDEPENDENT_AMBULATORY_CARE_PROVIDER_SITE_OTHER): Payer: Self-pay | Admitting: Internal Medicine

## 2021-03-16 ENCOUNTER — Ambulatory Visit (INDEPENDENT_AMBULATORY_CARE_PROVIDER_SITE_OTHER): Payer: Medicare Other | Admitting: Internal Medicine

## 2021-03-16 ENCOUNTER — Other Ambulatory Visit: Payer: Self-pay

## 2021-03-16 VITALS — BP 130/72 | HR 104 | Temp 97.8°F | Ht 61.0 in | Wt 200.8 lb

## 2021-03-16 DIAGNOSIS — E669 Obesity, unspecified: Secondary | ICD-10-CM | POA: Diagnosis not present

## 2021-03-16 MED ORDER — TIRZEPATIDE 2.5 MG/0.5ML ~~LOC~~ SOAJ: 2.5000 mg | SUBCUTANEOUS | 3 refills | Status: DC

## 2021-03-16 NOTE — Progress Notes (Signed)
Metrics: Intervention Frequency ACO  Documented Smoking Status Yearly  Screened one or more times in 24 months  Cessation Counseling or  Active cessation medication Past 24 months  Past 24 months   Guideline developer: UpToDate (See UpToDate for funding source) Date Released: 2014       Wellness Office Visit  Subjective:  Patient ID: Annette Saunders, female    DOB: 10/25/1945  Age: 75 y.o. MRN: [id number]  CC: Treatment of obesity. HPI  Unfortunately, this lady could not tolerate oral Rybelsus.  He did actually help her lose weight significantly.  She wonders if there are any other medications that we can try. Past Medical History:  Diagnosis Date   Anxiety    Essential hypertension, benign 06/06/2019   Generalized osteoarthritis    GERD (gastroesophageal reflux disease)    Hyperlipidemia    Hypothyroidism, adult 06/06/2019   Insomnia    Malaise and fatigue 06/06/2019   Obesity (BMI 30.0-34.9) 06/06/2019   Past Surgical History:  Procedure Laterality Date   BALLOON DILATION N/A 12/08/2020   Procedure: BALLOON DILATION;  Surgeon: Eloise Harman, DO;  [facility];  Service: Endoscopy;  Laterality: N/A;   BIOPSY  12/08/2020   Procedure: BIOPSY;  Surgeon: Eloise Harman, DO;  [facility];  Service: Endoscopy;;  gastric   COLONOSCOPY  10/2014   Dr. Cristine Polio, outside hospital. Colonoscopy performed with Propofol, no polyps, small to medium size internal hemorrhoids noted.    COLONOSCOPY WITH PROPOFOL N/A 12/08/2020   Procedure: COLONOSCOPY WITH PROPOFOL;  Surgeon: Eloise Harman, DO;  [facility];  Service: Endoscopy;  Laterality: N/A;  am appt   ECTOPIC PREGNANCY SURGERY     ELBOW FRACTURE SURGERY     ESOPHAGOGASTRODUODENOSCOPY (EGD) WITH PROPOFOL N/A 12/08/2020   Procedure: ESOPHAGOGASTRODUODENOSCOPY (EGD) WITH PROPOFOL;  Surgeon: Eloise Harman, DO;  [facility];  Service: Endoscopy;  Laterality: N/A;   HIATAL HERNIA REPAIR      JOINT REPLACEMENT     bilateral knee   LIPOMA EXCISION Right 08/26/2020   Procedure: MINOR EXCISION LIPOMA;THIGH;  Surgeon: Virl Cagey, MD;  [facility];  Service: General;  Laterality: Right;   POLYPECTOMY  12/08/2020   Procedure: POLYPECTOMY;  Surgeon: Eloise Harman, DO;  [facility];  Service: Endoscopy;;   TONSILLECTOMY       Family History  Problem Relation Age of Onset   Colon cancer Neg Hx     Social History   Social History Narrative   Widow for 22 years-husband died of MI.Lives alone.   Social History   Tobacco Use   Smoking status: Never   Smokeless tobacco: Never  Substance Use Topics   Alcohol use: No    Alcohol/week: 0.0 standard drinks    Current Meds  Medication Sig   Cholecalciferol (VITAMIN D3) 50 MCG (2000 UT) capsule Take 2,000 Units by mouth daily.   cyclobenzaprine (FLEXERIL) 10 MG tablet TAKE 1 TABLET(10 MG) BY MOUTH THREE TIMES DAILY AS NEEDED FOR MUSCLE SPASMS. USE SPARINGLY DUE TO INCREASED FALL RISK   dicyclomine (BENTYL) 10 MG capsule TAKE 1 CAPSULE(10 MG) BY MOUTH TWICE DAILY AS NEEDED   escitalopram (LEXAPRO) 10 MG tablet TAKE 1 TABLET BY MOUTH EVERY DAY   esomeprazole (NEXIUM) 40 MG capsule TAKE ONE CAPSULE BY MOUTH TWICE DAILY 30 MINUTES BEFORE MEALS   famotidine (PEPCID) 20 MG tablet TAKE 1 TABLET(20 MG) BY MOUTH AT BEDTIME   NP THYROID 120 MG tablet  TAKE 1 TABLET(120 MG) BY MOUTH TWICE DAILY (Patient taking differently: Take 120 mg by mouth in the morning and at bedtime.)   NP THYROID 60 MG tablet Take 1 tablet (60 mg total) by mouth 2 (two) times daily.   progesterone (PROMETRIUM) 200 MG capsule TAKE 3 CAPSULES BY MOUTH EVERY NIGHT   tirzepatide (MOUNJARO) 2.5 MG/0.5ML Pen Inject 2.5 mg into the skin once a week.   traMADol (ULTRAM) 50 MG tablet Take 1 tablet (50 mg total) by mouth every 6 (six) hours as needed.     [place] Office Visit from 09/10/2020 in [facility]  PHQ-9 Total Score 0        Objective:   Today's Vitals: BP 130/72   Pulse (!) 104   Temp 97.8 F (36.6 C) (Temporal)   Ht 5\' 1"  (1.549 m)   Wt 200 lb 12.8 oz (91.1 kg)   SpO2 98%   BMI 37.94 kg/m  Vitals with BMI 03/16/2021 02/16/2021 01/12/2021  Height 5\' 1"  5\' 1"  5\' 1"   Weight 200 lbs 13 oz 204 lbs 205 lbs 13 oz  BMI 37.96 27.07 86.75  Systolic 449 201 007  Diastolic 72 78 94  Pulse 121 111 104     Physical Exam  She remains obese.     Assessment   1. Obesity (BMI 30-39.9)       Tests ordered No orders of the defined types were placed in this encounter.    Plan: 1.  I will try new medication which is a GLP-1 agonist and an injectable form which may be approved by the insurance called Tirzepatide.  Hopefully, her insurance will approve this. 2.  Follow-up as scheduled in August.   Meds ordered this encounter  Medications   tirzepatide [facility]) 2.5 MG/0.5ML Pen    Sig: Inject 2.5 mg into the skin once a week.    Dispense:  2 mL    Refill:  3     Edelyn Heidel Luther Parody, MD

## 2021-03-23 ENCOUNTER — Ambulatory Visit (INDEPENDENT_AMBULATORY_CARE_PROVIDER_SITE_OTHER): Payer: Medicare Other | Admitting: Internal Medicine

## 2021-04-19 ENCOUNTER — Ambulatory Visit (INDEPENDENT_AMBULATORY_CARE_PROVIDER_SITE_OTHER): Payer: Medicare Other | Admitting: Internal Medicine

## 2021-05-04 DIAGNOSIS — H43393 Other vitreous opacities, bilateral: Secondary | ICD-10-CM | POA: Diagnosis not present

## 2021-05-05 DIAGNOSIS — Z96652 Presence of left artificial knee joint: Secondary | ICD-10-CM | POA: Diagnosis not present

## 2021-05-05 DIAGNOSIS — E669 Obesity, unspecified: Secondary | ICD-10-CM | POA: Diagnosis not present

## 2021-05-05 DIAGNOSIS — F5104 Psychophysiologic insomnia: Secondary | ICD-10-CM | POA: Diagnosis not present

## 2021-05-05 DIAGNOSIS — Z7189 Other specified counseling: Secondary | ICD-10-CM | POA: Diagnosis not present

## 2021-05-05 DIAGNOSIS — K219 Gastro-esophageal reflux disease without esophagitis: Secondary | ICD-10-CM | POA: Diagnosis not present

## 2021-05-05 DIAGNOSIS — K449 Diaphragmatic hernia without obstruction or gangrene: Secondary | ICD-10-CM | POA: Diagnosis not present

## 2021-05-16 DIAGNOSIS — Z23 Encounter for immunization: Secondary | ICD-10-CM | POA: Diagnosis not present

## 2021-05-17 DIAGNOSIS — E782 Mixed hyperlipidemia: Secondary | ICD-10-CM | POA: Diagnosis not present

## 2021-05-20 ENCOUNTER — Other Ambulatory Visit: Payer: Self-pay | Admitting: Gastroenterology

## 2021-05-20 DIAGNOSIS — Z96651 Presence of right artificial knee joint: Secondary | ICD-10-CM | POA: Diagnosis not present

## 2021-05-24 DIAGNOSIS — E782 Mixed hyperlipidemia: Secondary | ICD-10-CM | POA: Insufficient documentation

## 2021-05-25 DIAGNOSIS — Z96651 Presence of right artificial knee joint: Secondary | ICD-10-CM | POA: Diagnosis not present

## 2021-05-25 DIAGNOSIS — K449 Diaphragmatic hernia without obstruction or gangrene: Secondary | ICD-10-CM | POA: Diagnosis not present

## 2021-05-25 DIAGNOSIS — I10 Essential (primary) hypertension: Secondary | ICD-10-CM | POA: Diagnosis not present

## 2021-05-25 DIAGNOSIS — R600 Localized edema: Secondary | ICD-10-CM | POA: Diagnosis not present

## 2021-05-25 DIAGNOSIS — N1831 Chronic kidney disease, stage 3a: Secondary | ICD-10-CM | POA: Diagnosis not present

## 2021-05-25 DIAGNOSIS — R7401 Elevation of levels of liver transaminase levels: Secondary | ICD-10-CM | POA: Diagnosis not present

## 2021-05-25 DIAGNOSIS — E782 Mixed hyperlipidemia: Secondary | ICD-10-CM | POA: Diagnosis not present

## 2021-05-25 DIAGNOSIS — Z96652 Presence of left artificial knee joint: Secondary | ICD-10-CM | POA: Diagnosis not present

## 2021-05-25 DIAGNOSIS — F5104 Psychophysiologic insomnia: Secondary | ICD-10-CM | POA: Diagnosis not present

## 2021-05-25 DIAGNOSIS — K219 Gastro-esophageal reflux disease without esophagitis: Secondary | ICD-10-CM | POA: Diagnosis not present

## 2021-05-31 NOTE — Progress Notes (Signed)
Primary Care Physician:  Dr. Nevada Crane Primary GI: Dr. Abbey Chatters  Chief Complaint  Patient presents with   Gastroesophageal Reflux   Dysphagia    With some foods     HPI:   Annette Saunders is a 75 y.o. female presenting today with a history of GERD, dysphagia,BPE April 2019 with prior fundoplication without recurrent hiatal hernia, otherwise normal. In interim from last appt, underwent colonoscopy and EGD. Colonoscopy with tubular adenoma. EGD with benign-appearing esophageal stenosis, s/p dilation, gastritis s/p biopsy, normal duodenum. Biopsy with mild hyperemia, negative H.pylori.   If eating a piece of meat, has to press the side of her neck to get it down. Has to press on right side of neck then food goes down.   Has nocturnal burping. Has globus sensation. Nexium BID. Pepcid as needed at night.   Chronic RLQ discomfort at times that has been previously evaluated.   Past Medical History:  Diagnosis Date   Anxiety    Essential hypertension, benign 06/06/2019   Generalized osteoarthritis    GERD (gastroesophageal reflux disease)    Hyperlipidemia    Hypothyroidism, adult 06/06/2019   Insomnia    Malaise and fatigue 06/06/2019   Obesity (BMI 30.0-34.9) 06/06/2019    Past Surgical History:  Procedure Laterality Date   BALLOON DILATION N/A 12/08/2020   Procedure: BALLOON DILATION;  Surgeon: Eloise Harman, DO;  [facility];  Service: Endoscopy;  Laterality: N/A;   BIOPSY  12/08/2020   Procedure: BIOPSY;  Surgeon: Eloise Harman, DO;  [facility];  Service: Endoscopy;;  gastric   COLONOSCOPY  10/2014   Dr. Cristine Polio, outside hospital. Colonoscopy performed with Propofol, no polyps, small to medium size internal hemorrhoids noted.    COLONOSCOPY WITH PROPOFOL N/A 12/08/2020   fair prep, non-bleeding internal hemorrhoids, one 2 mm polyp in transverse colon (tubular adenoma)   ECTOPIC PREGNANCY SURGERY     ELBOW FRACTURE SURGERY      ESOPHAGOGASTRODUODENOSCOPY (EGD) WITH PROPOFOL N/A 12/08/2020   benign-appearing esophageal stenosis, s/p dilation, gastritis s/p biopsy, normal duodenum. Biopsy with mild hyperemia, negative H.pylori   HIATAL HERNIA REPAIR     JOINT REPLACEMENT     bilateral knee   LIPOMA EXCISION Right 08/26/2020   Procedure: MINOR EXCISION LIPOMA;THIGH;  Surgeon: Virl Cagey, MD;  [facility];  Service: General;  Laterality: Right;   POLYPECTOMY  12/08/2020   Procedure: POLYPECTOMY;  Surgeon: Eloise Harman, DO;  [facility];  Service: Endoscopy;;   TONSILLECTOMY      Current Outpatient Medications  Medication Sig Dispense Refill   Cholecalciferol (VITAMIN D3) 50 MCG (2000 UT) capsule Take 2,000 Units by mouth daily.     cyclobenzaprine (FLEXERIL) 10 MG tablet TAKE 1 TABLET(10 MG) BY MOUTH THREE TIMES DAILY AS NEEDED FOR MUSCLE SPASMS. USE SPARINGLY DUE TO INCREASED FALL RISK 30 tablet 1   dicyclomine (BENTYL) 10 MG capsule TAKE 1 CAPSULE(10 MG) BY MOUTH TWICE DAILY AS NEEDED 120 capsule 3   escitalopram (LEXAPRO) 10 MG tablet TAKE 1 TABLET BY MOUTH EVERY DAY 90 tablet 0   esomeprazole (NEXIUM) 40 MG capsule TAKE 1 CAPSULE(40 MG) BY MOUTH DAILY 30 MINUTES BEFORE A MEAL (Patient taking differently: Take 40 mg by mouth 2 (two) times daily.) 90 capsule 1   famotidine (PEPCID) 20 MG tablet TAKE 1 TABLET(20 MG) BY MOUTH AT BEDTIME 90 tablet 1   hydrochlorothiazide (MICROZIDE) 12.5 MG capsule Take 1 capsule by mouth daily.  olmesartan (BENICAR) 20 MG tablet Take 1 tablet by mouth daily.     No current facility-administered medications for this visit.    Allergies as of 06/01/2021 - Review Complete 06/01/2021  Allergen Reaction Noted   Dilantin [phenytoin sodium extended] Other (See Comments) 03/28/2019   Doxycycline hyclate  12/27/2018    Family History  Problem Relation Age of Onset   Colon cancer Neg Hx     Social History   Socioeconomic History   Marital status:  Widowed    Spouse name: Not on file   Number of children: Not on file   Years of education: Not on file   Highest education level: Not on file  Occupational History   Occupation: retired  Tobacco Use   Smoking status: Never   Smokeless tobacco: Never  Vaping Use   Vaping Use: Never used  Substance and Sexual Activity   Alcohol use: No    Alcohol/week: 0.0 standard drinks   Drug use: No   Sexual activity: Not on file  Other Topics Concern   Not on file  Social History Narrative   Widow for 60 years-husband died of MI.Lives alone.   Social Determinants of Health   Financial Resource Strain: Not on file  Food Insecurity: Not on file  Transportation Saunders: Not on file  Physical Activity: Not on file  Stress: Not on file  Social Connections: Not on file    Review of Systems: Gen: Denies fever, chills, anorexia. Denies fatigue, weakness, weight loss.  CV: Denies chest pain, palpitations, syncope, peripheral edema, and claudication. Resp: Denies dyspnea at rest, cough, wheezing, coughing up blood, and pleurisy. GI: see HPI Derm: Denies rash, itching, dry skin Psych: Denies depression, anxiety, memory loss, confusion. No homicidal or suicidal ideation.  Heme: Denies bruising, bleeding, and enlarged lymph nodes.  Physical Exam: BP 128/70   Pulse 98   Temp (!) 97.1 F (36.2 C) (Temporal)   Ht 5\' 1"  (1.549 m)   Wt 210 lb 6.4 oz (95.4 kg)   BMI 39.75 kg/m  General:   Alert and oriented. No distress noted. Pleasant and cooperative.  Head:  Normocephalic and atraumatic. Eyes:  Conjuctiva clear without scleral icterus. Mouth:  mask in place Neck: no obvious lymphadenopathy   Abdomen:  +BS, soft, non-tender and non-distended. No rebound or guarding. No HSM or masses noted. Msk:  Symmetrical without gross deformities. Normal posture. Extremities:  Without edema. Neurologic:  Alert and  oriented x4 Psych:  Alert and cooperative. Normal mood and  affect.  ASSESSMENT/PLAN: Annette Saunders is a 75 y.o. female presenting today for routine follow-up with history of GERD, dysphagia,BPE April 2019 with prior fundoplication without recurrent hiatal hernia, otherwise normal. In interim from last appt, underwent colonoscopy and EGD. Colonoscopy with tubular adenoma. EGD with benign-appearing esophageal stenosis, s/p dilation, gastritis s/p biopsy, normal duodenum. Biopsy with mild hyperemia, negative H.pylori.  Still with dysphagia and interesting symptoms of having to "press" the right side of her neck to have food pass at times. Difficult to feel any lymphadenopathy on exam. Will pursue US neck. May need updated BPE.   GERD: not ideally managed likely due to diet/behavior. Continue  Nexium BID. Pepcid prn at night. Avoid laying down for 2-3 hours after eating/drinking. GERD diet provided.   Next colonoscopy in 5 years if health permits.  Return in 4 months  Annette Needs, PhD, [facility]enter[facility]

## 2021-06-01 ENCOUNTER — Encounter: Payer: Self-pay | Admitting: *Deleted

## 2021-06-01 ENCOUNTER — Ambulatory Visit (INDEPENDENT_AMBULATORY_CARE_PROVIDER_SITE_OTHER): Payer: Medicare Other | Admitting: Gastroenterology

## 2021-06-01 ENCOUNTER — Other Ambulatory Visit: Payer: Self-pay

## 2021-06-01 ENCOUNTER — Encounter: Payer: Self-pay | Admitting: Gastroenterology

## 2021-06-01 VITALS — BP 128/70 | HR 98 | Temp 97.1°F | Ht 61.0 in | Wt 210.4 lb

## 2021-06-01 DIAGNOSIS — R0989 Other specified symptoms and signs involving the circulatory and respiratory systems: Secondary | ICD-10-CM | POA: Diagnosis not present

## 2021-06-01 DIAGNOSIS — K219 Gastro-esophageal reflux disease without esophagitis: Secondary | ICD-10-CM | POA: Diagnosis not present

## 2021-06-01 NOTE — Patient Instructions (Signed)
I am ordering an ultrasound of your neck (thyroid).  Continue Nexium twice a day, 30 minutes before breakfast and dinner.   Make sure not to eat or drink within 2-3 hours of laying down.  Will see you in 4 months! Further recommendations after review of ultrasound!   I enjoyed seeing you again today! As you know, I value our relationship and want to provide genuine, compassionate, and quality care. I welcome your feedback. If you receive a survey regarding your visit,  I greatly appreciate you taking time to fill this out. See you next time!  Annitta Needs, PhD, ANP-BC [facility]enter[facility]

## 2021-06-07 ENCOUNTER — Other Ambulatory Visit: Payer: Self-pay

## 2021-06-07 ENCOUNTER — Ambulatory Visit (HOSPITAL_COMMUNITY)
Admission: RE | Admit: 2021-06-07 | Discharge: 2021-06-07 | Disposition: A | Discharge status: Home / Self Care | Payer: Medicare Other | Source: Ambulatory Visit | Attending: Gastroenterology | Admitting: Gastroenterology

## 2021-06-07 DIAGNOSIS — R0989 Other specified symptoms and signs involving the circulatory and respiratory systems: Secondary | ICD-10-CM | POA: Diagnosis not present

## 2021-06-07 DIAGNOSIS — E079 Disorder of thyroid, unspecified: Secondary | ICD-10-CM | POA: Diagnosis not present

## 2021-06-07 DIAGNOSIS — R1310 Dysphagia, unspecified: Secondary | ICD-10-CM | POA: Diagnosis not present

## 2021-06-08 ENCOUNTER — Telehealth: Payer: Self-pay | Admitting: Gastroenterology

## 2021-06-08 NOTE — Telephone Encounter (Signed)
This pt wants results explained to her but you haven't seen it yet. Please advise

## 2021-06-08 NOTE — Telephone Encounter (Signed)
PATIENT WOULD LIKE SOMEONE TO CALL HER AND EXPLAIN HER THYROID RESULTS TO HER.

## 2021-06-09 NOTE — Telephone Encounter (Signed)
Please see result note 

## 2021-06-29 ENCOUNTER — Other Ambulatory Visit: Payer: Self-pay | Admitting: Gastroenterology

## 2021-06-29 ENCOUNTER — Telehealth: Payer: Self-pay | Admitting: Internal Medicine

## 2021-06-29 ENCOUNTER — Telehealth: Payer: Self-pay | Admitting: Gastroenterology

## 2021-06-29 MED ORDER — ESOMEPRAZOLE MAGNESIUM 40 MG PO CPDR: 40.0000 mg | DELAYED_RELEASE_CAPSULE | Freq: Two times a day (BID) | ORAL | 3 refills | Status: DC

## 2021-06-29 NOTE — Telephone Encounter (Signed)
Please add pharmacy needed and I will send new Rx. Please also call [facility] to cancel Rx that was sent today.

## 2021-06-29 NOTE — Addendum Note (Signed)
Addended by: Annitta Needs on: 06/29/2021 12:02 PM   Modules accepted: Orders

## 2021-06-29 NOTE — Telephone Encounter (Signed)
Pt has questions about how her prescription was filled. Transferred call to nurse's VM.

## 2021-06-29 NOTE — Telephone Encounter (Signed)
This is for pt's generic Nexium. Thank you

## 2021-06-29 NOTE — Telephone Encounter (Signed)
FYI: phoned and advised the pt of what you said and you had completed medication change. She thanks you very much.

## 2021-06-29 NOTE — Progress Notes (Signed)
error 

## 2021-06-29 NOTE — Telephone Encounter (Signed)
PATIENT CALLED ABOUT HER PRESCRIPTION AT [facility].  SAID THAT IT IS VERY EXPENSIVE AND WANTS TO TALK TO A NURSE ABOUT SENDING IT THROUGH rx CARE?

## 2021-06-29 NOTE — Telephone Encounter (Signed)
Returned the pt's call and was advised by here that you sent in the wrong amount of esomeprazole for her. She states she normally gets a 3 month supply of 180, but she has been picking up only 90 capsules. The pt states she is already on her third bottle of it. She states she wasn't at home right now but she can't remember if it is 120 or 180 capsules she gets. She knows she takes it x 2 a day. Please advise

## 2021-06-29 NOTE — Telephone Encounter (Signed)
Please let her know I'm sorry. I wasn't the one who refilled last time. I have completed it.

## 2021-06-30 ENCOUNTER — Other Ambulatory Visit: Payer: Self-pay

## 2021-06-30 ENCOUNTER — Other Ambulatory Visit: Payer: Self-pay | Admitting: Gastroenterology

## 2021-06-30 ENCOUNTER — Telehealth: Payer: Self-pay | Admitting: *Deleted

## 2021-06-30 DIAGNOSIS — K219 Gastro-esophageal reflux disease without esophagitis: Secondary | ICD-10-CM

## 2021-06-30 MED ORDER — ESOMEPRAZOLE MAGNESIUM 40 MG PO CPDR: 40.0000 mg | DELAYED_RELEASE_CAPSULE | Freq: Two times a day (BID) | ORAL | 3 refills | Status: DC

## 2021-06-30 NOTE — Progress Notes (Signed)
Pharmacy added.

## 2021-06-30 NOTE — Telephone Encounter (Signed)
Pharmacy has been added.

## 2021-06-30 NOTE — Telephone Encounter (Signed)
This has already been taken care of

## 2021-06-30 NOTE — Telephone Encounter (Signed)
RX sent

## 2021-06-30 NOTE — Telephone Encounter (Signed)
Received refill request for Nexium 40mg  1 capsule by mouth 2 times daily before meals.

## 2021-06-30 NOTE — Telephone Encounter (Signed)
Noted   Pt made aware to phone in a couple of hours to see if her Rx is ready. Pt expressed understanding

## 2021-06-30 NOTE — Telephone Encounter (Signed)
Phoned [facility] and had them to cancel the Rx, pt returned call the pt and LMOVM. Pt returned call before I could finish the note and I advised her that we will do that for her and the Rx at [facility] has been cancelled. Added Rx Care in system.

## 2021-07-02 DIAGNOSIS — F411 Generalized anxiety disorder: Secondary | ICD-10-CM | POA: Diagnosis not present

## 2021-07-02 DIAGNOSIS — F5104 Psychophysiologic insomnia: Secondary | ICD-10-CM | POA: Diagnosis not present

## 2021-07-02 DIAGNOSIS — I10 Essential (primary) hypertension: Secondary | ICD-10-CM | POA: Diagnosis not present

## 2021-07-02 DIAGNOSIS — R1031 Right lower quadrant pain: Secondary | ICD-10-CM | POA: Diagnosis not present

## 2021-07-05 MED ORDER — ESOMEPRAZOLE MAGNESIUM 40 MG PO CPDR: 40.0000 mg | DELAYED_RELEASE_CAPSULE | Freq: Two times a day (BID) | ORAL | 3 refills | Status: DC

## 2021-07-05 NOTE — Addendum Note (Signed)
Addended by: Inda Castle on: 07/05/2021 02:31 PM   Modules accepted: Orders

## 2021-07-27 ENCOUNTER — Other Ambulatory Visit: Payer: Self-pay | Admitting: Gastroenterology

## 2021-07-27 ENCOUNTER — Encounter: Payer: Medicare Other | Admitting: Obstetrics & Gynecology

## 2021-08-02 ENCOUNTER — Other Ambulatory Visit (HOSPITAL_COMMUNITY): Payer: Self-pay

## 2021-08-04 NOTE — Patient Instructions (Signed)
DUE TO COVID-19 ONLY ONE VISITOR IS ALLOWED TO COME WITH YOU AND STAY IN THE WAITING ROOM ONLY DURING PRE OP AND PROCEDURE.   **NO VISITORS ARE ALLOWED IN THE SHORT STAY AREA OR RECOVERY ROOM!!**  IF YOU WILL BE ADMITTED INTO THE HOSPITAL YOU ARE ALLOWED ONLY TWO SUPPORT PEOPLE DURING VISITATION HOURS ONLY (7 AM -8PM)    Up to two visitors ages 57+ are allowed at one time in a patient's room.  The visitors may rotate out with other people throughout the day.  Additionally, up to two children between the ages of 77 and 35 are allowed and do not count toward the number of allowed visitors.  Children within this age range must be accompanied by an adult visitor.  One adult visitor may remain with the patient overnight and must be in the room by 8 PM.  COVID SWAB TESTING MUST BE COMPLETED ON: Monday, 08-16-21  Between the hours of 8 and 3  **MUST PRESENT COMPLETED FORM AT TESTING SITE**    [place] (backside of the building)  You are not required to quarantine, however you are required to wear a well-fitted mask when you are out and around people not in your household.  Hand Hygiene often Do NOT share personal items Notify your provider if you are in close contact with someone who has COVID or you develop fever 100.4 or greater, new onset of sneezing, cough, sore throat, shortness of breath or body aches.        Your procedure is scheduled on: Wednesday, 08-18-21   Report to [facility] Main  Entrance     Report to admitting at 7:25 AM   Call this number if you have problems the morning of surgery [phone]   Do not eat food :After Midnight.   May have liquids until 7:00 AM day of surgery  CLEAR LIQUID DIET  Foods Allowed                                                                     Foods Excluded  Water, Black Coffee (no milk/no creamer) and tea, regular and decaf                              liquids that you cannot  Plain Jell-O in any  flavor  (No red)                         see through such as: Fruit ices (not with fruit pulp)                                 milk, soups, orange juice  Iced Popsicles (No red)                                    All solid food                             Apple juices Sports  drinks like Gatorade (No red) Lightly seasoned clear broth or consume(fat free) Sugar    Complete one Ensure drink the morning of surgery at  7:00 AM the day of surgery.       The day of surgery:  Drink ONE (1) Pre-Surgery Clear Ensure or G2 by am the morning of surgery. Drink in one sitting. Do not sip.  This drink was given to you during your hospital  pre-op appointment visit. Nothing else to drink after completing the  Pre-Surgery Clear Ensure or G2.          If you have questions, please contact your surgeon's office.     Oral Hygiene is also important to reduce your risk of infection.                                    Remember - BRUSH YOUR TEETH THE MORNING OF SURGERY WITH YOUR REGULAR TOOTHPASTE   Do NOT smoke after Midnight   Take these medicines the morning of surgery with A SIP OF WATER:  Atorvastatin, Nexium   Stop all vitamins and herbal supplements a week before surgery   Bring CPAP mask and tubing day of surgery             You may not have any metal on your body including hair pins, jewelry, and body piercing             Do not wear make-up, lotions, powders, perfumes or deodorant  Do not wear nail polish including gel and S&S, artificial/acrylic nails, or any other type of covering on natural nails including finger and toenails. If you have artificial nails, gel coating, etc. that needs to be removed by a nail salon please have this removed prior to surgery or surgery may need to be canceled/ delayed if the surgeon/ anesthesia feels like they are unable to be safely monitored.   Do not shave  48 hours prior to surgery.        Do not bring valuables to the hospital. [place].   Contacts, dentures or bridgework may not be worn into surgery.   Bring small overnight bag day of surgery.  Special Instructions: Bring a copy of your healthcare power of attorney and living will documents the day of surgery if you haven't scanned them in before.  Please read over the following fact sheets you were given: IF YOU HAVE QUESTIONS ABOUT YOUR PRE OP INSTRUCTIONS PLEASE CALL [phone]   [place] - Preparing for Surgery Before surgery, you can play an important role.  Because skin is not sterile, your skin needs to be as free of germs as possible.  You can reduce the number of germs on your skin by washing with CHG (chlorahexidine gluconate) soap before surgery.  CHG is an antiseptic cleaner which kills germs and bonds with the skin to continue killing germs even after washing. Please DO NOT use if you have an allergy to CHG or antibacterial soaps.  If your skin becomes reddened/irritated stop using the CHG and inform your nurse when you arrive at Short Stay. Do not shave (including legs and underarms) for at least 48 hours prior to the first CHG shower.  You may shave your face/neck.  Please follow these instructions carefully:  1.  Shower with CHG Soap the night before surgery and the  morning of surgery.  2.  If  you choose to wash your hair, wash your hair first as usual with your normal  shampoo.  3.  After you shampoo, rinse your hair and body thoroughly to remove the shampoo.                             4.  Use CHG as you would any other liquid soap.  You can apply chg directly to the skin and wash.  Gently with a scrungie or clean washcloth.  5.  Apply the CHG Soap to your body ONLY FROM THE NECK DOWN.   Do   not use on face/ open                           Wound or open sores. Avoid contact with eyes, ears mouth and   genitals (private parts).                       Wash face,  Genitals (private parts) with your normal soap.             6.   Wash thoroughly, paying special attention to the area where your    surgery  will be performed.  7.  Thoroughly rinse your body with warm water from the neck down.  8.  DO NOT shower/wash with your normal soap after using and rinsing off the CHG Soap.                9.  Pat yourself dry with a clean towel.            10.  Wear clean pajamas.            11.  Place clean sheets on your bed the night of your first shower and do not  sleep with pets. Day of Surgery : Do not apply any lotions/deodorants the morning of surgery.  Please wear clean clothes to the hospital/surgery center.  FAILURE TO FOLLOW THESE INSTRUCTIONS MAY RESULT IN THE CANCELLATION OF YOUR SURGERY  PATIENT SIGNATURE_________________________________  NURSE SIGNATURE__________________________________  ________________________________________________________________________   Adam Phenix  An incentive spirometer is a tool that can help keep your lungs clear and active. This tool measures how well you are filling your lungs with each breath. Taking long deep breaths may help reverse or decrease the chance of developing breathing (pulmonary) problems (especially infection) following: A long period of time when you are unable to move or be active. BEFORE THE PROCEDURE  If the spirometer includes an indicator to show your best effort, your nurse or respiratory therapist will set it to a desired goal. If possible, sit up straight or lean slightly forward. Try not to slouch. Hold the incentive spirometer in an upright position. INSTRUCTIONS FOR USE  Sit on the edge of your bed if possible, or sit up as far as you can in bed or on a chair. Hold the incentive spirometer in an upright position. Breathe out normally. Place the mouthpiece in your mouth and seal your lips tightly around it. Breathe in slowly and as deeply as possible, raising the piston or the ball toward the top of the column. Hold your breath for 3-5  seconds or for as long as possible. Allow the piston or ball to fall to the bottom of the column. Remove the mouthpiece from your mouth and breathe out normally. Rest for a few seconds and repeat Steps  1 through 7 at least 10 times every 1-2 hours when you are awake. Take your time and take a few normal breaths between deep breaths. The spirometer may include an indicator to show your best effort. Use the indicator as a goal to work toward during each repetition. After each set of 10 deep breaths, practice coughing to be sure your lungs are clear. If you have an incision (the cut made at the time of surgery), support your incision when coughing by placing a pillow or rolled up towels firmly against it. Once you are able to get out of bed, walk around indoors and cough well. You may stop using the incentive spirometer when instructed by your caregiver.  RISKS AND COMPLICATIONS Take your time so you do not get dizzy or light-headed. If you are in pain, you may need to take or ask for pain medication before doing incentive spirometry. It is harder to take a deep breath if you are having pain. AFTER USE Rest and breathe slowly and easily. It can be helpful to keep track of a log of your progress. Your caregiver can provide you with a simple table to help with this. If you are using the spirometer at home, follow these instructions: [place] IF:  You are having difficultly using the spirometer. You have trouble using the spirometer as often as instructed. Your pain medication is not giving enough relief while using the spirometer. You develop fever of 100.5 F (38.1 C) or higher. SEEK IMMEDIATE MEDICAL CARE IF:  You cough up bloody sputum that had not been present before. You develop fever of 102 F (38.9 C) or greater. You develop worsening pain at or near the incision site. MAKE SURE YOU:  Understand these instructions. Will watch your condition. Will get help right away if you are  not doing well or get worse. Document Released: 12/26/2006 Document Revised: 11/07/2011 Document Reviewed: 02/26/2007 ExitCare Patient Information 2014 Exit[employer], [place].   ________________________________________________________________________  WHAT IS A BLOOD TRANSFUSION? Blood Transfusion Information  A transfusion is the replacement of blood or some of its parts. Blood is made up of multiple cells which provide different functions. Red blood cells carry oxygen and are used for blood loss replacement. White blood cells fight against infection. Platelets control bleeding. Plasma helps clot blood. Other blood products are available for specialized needs, such as hemophilia or other clotting disorders. BEFORE THE TRANSFUSION  Who gives blood for transfusions?  Healthy volunteers who are fully evaluated to make sure their blood is safe. This is blood bank blood. Transfusion therapy is the safest it has ever been in the practice of medicine. Before blood is taken from a donor, a complete history is taken to make sure that person has no history of diseases nor engages in risky social behavior (examples are intravenous drug use or sexual activity with multiple partners). The donor's travel history is screened to minimize risk of transmitting infections, such as malaria. The donated blood is tested for signs of infectious diseases, such as HIV and hepatitis. The blood is then tested to be sure it is compatible with you in order to minimize the chance of a transfusion reaction. If you or a relative donates blood, this is often done in anticipation of surgery and is not appropriate for emergency situations. It takes many days to process the donated blood. RISKS AND COMPLICATIONS Although transfusion therapy is very safe and saves many lives, the main dangers of transfusion include:  Getting an infectious disease.  Developing a transfusion reaction. This is an allergic reaction to something in the blood  you were given. Every precaution is taken to prevent this. The decision to have a blood transfusion has been considered carefully by your caregiver before blood is given. Blood is not given unless the benefits outweigh the risks. AFTER THE TRANSFUSION Right after receiving a blood transfusion, you will usually feel much better and more energetic. This is especially true if your red blood cells have gotten low (anemic). The transfusion raises the level of the red blood cells which carry oxygen, and this usually causes an energy increase. The nurse administering the transfusion will monitor you carefully for complications. HOME CARE INSTRUCTIONS  No special instructions are needed after a transfusion. You may find your energy is better. Speak with your caregiver about any limitations on activity for underlying diseases you may have. SEEK MEDICAL CARE IF:  Your condition is not improving after your transfusion. You develop redness or irritation at the intravenous (IV) site. SEEK IMMEDIATE MEDICAL CARE IF:  Any of the following symptoms occur over the next 12 hours: Shaking chills. You have a temperature by mouth above 102 F (38.9 C), not controlled by medicine. Chest, back, or muscle pain. People around you feel you are not acting correctly or are confused. Shortness of breath or difficulty breathing. Dizziness and fainting. You get a rash or develop hives. You have a decrease in urine output. Your urine turns a dark color or changes to pink, red, or brown. Any of the following symptoms occur over the next 10 days: You have a temperature by mouth above 102 F (38.9 C), not controlled by medicine. Shortness of breath. Weakness after normal activity. The white part of the eye turns yellow (jaundice). You have a decrease in the amount of urine or are urinating less often. Your urine turns a dark color or changes to pink, red, or brown. Document Released: 08/12/2000 Document Revised:  11/07/2011 Document Reviewed: 03/31/2008 [facility] Patient Information 2014 [place], [place].  _______________________________________________________________________

## 2021-08-04 NOTE — Progress Notes (Signed)
COVID swab appointment: 08-16-21  COVID Vaccine Completed: Yes x2 10-11-19 11-08-19 Date COVID Vaccine completed: Has received booster: Yes x1 07-21-20 COVID vaccine manufacturer:    Moderna     Date of COVID positive in last 90 days:  PCP - Allyn Kenner, MD Cardiologist - Rozann Lesches, MD.  Last OV 2019  Chest x-ray -  EKG -  Stress Test - greater than 2 years, Epic ECHO - 10-28-19 Epic Cardiac Cath -  Pacemaker/ICD device last checked: Spinal Cord Stimulator:  Sleep Study -  CPAP -   Fasting Blood Sugar -  Checks Blood Sugar _____ times a day  Blood Thinner Instructions: Aspirin Instructions: Last Dose:  Activity level:  Can go up a flight of stairs and perform activities of daily living without stopping and without symptoms of chest pain or shortness of breath.   Able to exercise without symptoms  Unable to go up a flight of stairs without symptoms of      Anesthesia review:  Hx of chest pain, ?PFO  Patient denies shortness of breath, fever, cough and chest pain at PAT appointment   Patient verbalized understanding of instructions that were given to them at the PAT appointment. Patient was also instructed that they will need to review over the PAT instructions again at home before surgery.

## 2021-08-05 ENCOUNTER — Encounter (HOSPITAL_COMMUNITY): Payer: Self-pay

## 2021-08-05 ENCOUNTER — Encounter (HOSPITAL_COMMUNITY)
Admission: RE | Admit: 2021-08-05 | Discharge: 2021-08-05 | Disposition: A | Discharge status: Home / Self Care | Payer: Medicare Other | Source: Ambulatory Visit | Attending: Orthopedic Surgery | Admitting: Orthopedic Surgery

## 2021-08-05 DIAGNOSIS — Z01818 Encounter for other preprocedural examination: Secondary | ICD-10-CM

## 2021-08-10 ENCOUNTER — Encounter: Payer: Self-pay | Admitting: Adult Health

## 2021-08-10 ENCOUNTER — Telehealth: Payer: Self-pay | Admitting: Orthopaedic Surgery

## 2021-08-10 ENCOUNTER — Other Ambulatory Visit: Payer: Self-pay

## 2021-08-10 ENCOUNTER — Ambulatory Visit (INDEPENDENT_AMBULATORY_CARE_PROVIDER_SITE_OTHER): Payer: Medicare Other | Admitting: Adult Health

## 2021-08-10 VITALS — BP 130/73 | HR 87 | Ht 61.0 in | Wt 221.0 lb

## 2021-08-10 DIAGNOSIS — M7918 Myalgia, other site: Secondary | ICD-10-CM | POA: Diagnosis not present

## 2021-08-10 DIAGNOSIS — G8929 Other chronic pain: Secondary | ICD-10-CM | POA: Insufficient documentation

## 2021-08-10 DIAGNOSIS — R1031 Right lower quadrant pain: Secondary | ICD-10-CM

## 2021-08-10 MED ORDER — CYCLOBENZAPRINE HCL 5 MG PO TABS: 5.0000 mg | ORAL_TABLET | Freq: Three times a day (TID) | ORAL | 0 refills | Status: DC | PRN

## 2021-08-10 NOTE — Telephone Encounter (Signed)
Patient came to office today; relays that her surgery, total knee revision (right), is scheduled with Dr Teola Bradley on 08/18/21. Wanted to let Dr Luna Glasgow know.

## 2021-08-10 NOTE — Progress Notes (Addendum)
Subjective:     Patient ID: Annette Saunders, female   DOB: [birth year], 75 y.o.   MRN: [id number]  HPI [name]  is a 75 year old white female,widowed, PM in complaining of RLQ pain, has had for years, had negative [place] and CT with Dr Anastasio Champion. She has gained some weight since stopping NP thyroid. She is having right knee replacement 08/18/21.  She has been off HRT and asked about restarting, I told her risk outweighs benefits at this age. PCP is Dr Nevada Crane.  Review of Systems RLQ pain for years, it moves sometimes too to the left Reviewed past medical,surgical, social and family history. Reviewed medications and allergies.     Objective:   Physical Exam BP 130/73 (BP Location: Left Arm, Patient Position: Sitting, Cuff Size: Large)    Pulse 87    Ht 5\' 1"  (1.549 m)    Wt 221 lb (100.2 kg)    BMI 41.76 kg/m     Skin warm and dry.Pelvic: external genitalia is normal in appearance no lesions, vagina: pale with loss of moisture and rugae,urethra has no lesions or masses noted, cervix:smooth, uterus: normal size, shape and contour, non tender, no masses felt, adnexa: no masses, RLQ tenderness noted,still tender with head raised. Bladder is non tender and no masses felt.No evidence of hernia. AA is 0 Fall risk is low Depression screen [facility] 2/9 08/10/2021 09/10/2020 11/06/2019  Decreased Interest 0 0 0  Down, Depressed, Hopeless 0 0 0  PHQ - 2 Score 0 0 0  Altered sleeping 0 0 -  Tired, decreased energy 0 0 -  Change in appetite 0 0 -  Feeling bad or failure about yourself  0 0 -  Trouble concentrating 0 0 -  Moving slowly or fidgety/restless 0 0 -  Suicidal thoughts 0 0 -  PHQ-9 Score 0 0 -  Difficult doing work/chores - Not difficult at all -  Some recent data might be hidden    GAD 7 : Generalized Anxiety Score 08/10/2021  Nervous, Anxious, on Edge 0  Control/stop worrying 0  Worry too much - different things 0  Trouble relaxing 0  Restless 0  Easily annoyed or irritable 0  Afraid - awful  might happen 0  Total GAD 7 Score 0      Upstream - 08/10/21 1048       Pregnancy Intention Screening   Does the patient want to become pregnant in the next year? N/A    Does the patient's partner want to become pregnant in the next year? N/A    Would the patient like to discuss contraceptive options today? N/A      Contraception Wrap Up   Current Method --   PM   End Method --   PM   Contraception Counseling Provided No            Examination chaperoned by Glenard Haring RN Assessment:    1. Chronic RLQ pain   2. Pain of muscle of abdomen Get pantie girdle Alternate heat and ice Will try flexeril Meds ordered this encounter  Medications   cyclobenzaprine (FLEXERIL) 5 MG tablet    Sig: Take 1 tablet (5 mg total) by mouth 3 (three) times daily as needed for muscle spasms.    Dispense:  30 tablet    Refill:  0    Order Specific Question:   Supervising Provider    Answer:   Florian Buff [[id number]]       Plan:  Follow up in 4 weeks

## 2021-08-12 ENCOUNTER — Encounter (HOSPITAL_COMMUNITY): Payer: Self-pay

## 2021-08-12 ENCOUNTER — Other Ambulatory Visit: Payer: Self-pay

## 2021-08-12 ENCOUNTER — Encounter (HOSPITAL_COMMUNITY)
Admission: RE | Admit: 2021-08-12 | Discharge: 2021-08-12 | Disposition: A | Discharge status: Home / Self Care | Payer: Medicare Other | Source: Ambulatory Visit | Attending: Orthopedic Surgery | Admitting: Orthopedic Surgery

## 2021-08-12 DIAGNOSIS — Z01818 Encounter for other preprocedural examination: Secondary | ICD-10-CM | POA: Insufficient documentation

## 2021-08-12 DIAGNOSIS — Z20822 Contact with and (suspected) exposure to covid-19: Secondary | ICD-10-CM | POA: Diagnosis not present

## 2021-08-12 LAB — CBC
HCT: 37.1 % (ref 36.0–46.0)
Hemoglobin: 12.1 g/dL (ref 12.0–15.0)
MCH: 30 pg (ref 26.0–34.0)
MCHC: 32.6 g/dL (ref 30.0–36.0)
MCV: 91.8 fL (ref 80.0–100.0)
Platelets: 398 10*3/uL (ref 150–400)
RBC: 4.04 MIL/uL (ref 3.87–5.11)
RDW: 14.2 % (ref 11.5–15.5)
WBC: 7.3 10*3/uL (ref 4.0–10.5)
nRBC: 0 % (ref 0.0–0.2)

## 2021-08-12 LAB — COMPREHENSIVE METABOLIC PANEL
ALT: 22 U/L (ref 0–44)
AST: 25 U/L (ref 15–41)
Albumin: 4 g/dL (ref 3.5–5.0)
Alkaline Phosphatase: 97 U/L (ref 38–126)
Anion gap: 8 (ref 5–15)
BUN: 29 mg/dL — ABNORMAL HIGH (ref 8–23)
CO2: 25 mmol/L (ref 22–32)
Calcium: 9.7 mg/dL (ref 8.9–10.3)
Chloride: 108 mmol/L (ref 98–111)
Creatinine, Ser: 1.09 mg/dL — ABNORMAL HIGH (ref 0.44–1.00)
GFR, Estimated: 53 mL/min — ABNORMAL LOW (ref >60)
Glucose, Bld: 108 mg/dL — ABNORMAL HIGH (ref 70–99)
Potassium: 4.4 mmol/L (ref 3.5–5.1)
Sodium: 141 mmol/L (ref 135–145)
Total Bilirubin: 0.4 mg/dL (ref 0.3–1.2)
Total Protein: 7.7 g/dL (ref 6.5–8.1)

## 2021-08-12 LAB — PROTIME-INR
INR: 0.9 (ref 0.8–1.2)
Prothrombin Time: 12.1 seconds (ref 11.4–15.2)

## 2021-08-12 LAB — SURGICAL PCR SCREEN
MRSA, PCR: NEGATIVE
Staphylococcus aureus: NEGATIVE

## 2021-08-12 NOTE — Patient Instructions (Signed)
DUE TO COVID-19 ONLY ONE VISITOR IS ALLOWED TO COME WITH YOU AND STAY IN THE WAITING ROOM ONLY DURING PRE OP AND PROCEDURE.   **NO VISITORS ARE ALLOWED IN THE SHORT STAY AREA OR RECOVERY ROOM!!**  IF YOU WILL BE ADMITTED INTO THE HOSPITAL YOU ARE ALLOWED ONLY TWO SUPPORT PEOPLE DURING VISITATION HOURS ONLY (7 AM -8PM)    Up to two visitors ages 13+ are allowed at one time in a patient's room.  The visitors may rotate out with other people throughout the day.  Additionally, up to two children between the ages of 63 and 76 are allowed and do not count toward the number of allowed visitors.  Children within this age range must be accompanied by an adult visitor.  One adult visitor may remain with the patient overnight and must be in the room by 8 PM.  COVID SWAB TESTING MUST BE COMPLETED ON:  Monday 08-16-21  Between the hours of 8 and 3  **MUST PRESENT COMPLETED FORM AT TESTING SITE**    [facility] [place] (backside of the building)  You are not required to quarantine, however you are required to wear a well-fitted mask when you are out and around people not in your household.  Hand Hygiene often Do NOT share personal items Notify your provider if you are in close contact with someone who has COVID or you develop fever 100.4 or greater, new onset of sneezing, cough, sore throat, shortness of breath or body aches.    Your procedure is scheduled on: Wednesday 08-18-21   Report to [facility] Main  Entrance     Report to admitting at  7:25 AM   Call this number if you have problems the morning of surgery [phone]   Do not eat food :After Midnight.   May have liquids until 7:00 AM day of surgery  CLEAR LIQUID DIET  Foods Allowed                                                                     Foods Excluded  Water, Black Coffee (no milk/no creamer) and tea, regular and decaf                              liquids that you cannot  Plain Jell-O in any  flavor  (No red)                         see through such as: Fruit ices (not with fruit pulp)                                 milk, soups, orange juice  Iced Popsicles (No red)                                    All solid food                             Apple juices Sports drinks like  Gatorade (No red) Lightly seasoned clear broth or consume(fat free) Sugar    Complete one Ensure drink the morning of surgery at 7:00 AM the day of surgery.     The day of surgery:  Drink ONE (1) Pre-Surgery Clear Ensure the morning of surgery. Drink in one sitting. Do not sip.  This drink was given to you during your hospital  pre-op appointment visit. Nothing else to drink after completing the Pre-Surgery Clear Ensure           If you have questions, please contact your surgeons office.     Oral Hygiene is also important to reduce your risk of infection.                                    Remember - BRUSH YOUR TEETH THE MORNING OF SURGERY WITH YOUR REGULAR TOOTHPASTE   Do NOT smoke after Midnight   Take these medicines the morning of surgery with A SIP OF WATER:  Atorvastatin, Nexium   Stop all vitamins and herbal supplements a week before surgery             You may not have any metal on your body including hair pins, jewelry, and body piercing             Do not wear make-up, lotions, powders, perfumes or deodorant  Do not wear nail polish including gel and S&S, artificial/acrylic nails, or any other type of covering on natural nails including finger and toenails. If you have artificial nails, gel coating, etc. that needs to be removed by a nail salon please have this removed prior to surgery or surgery may need to be canceled/ delayed if the surgeon/ anesthesia feels like they are unable to be safely monitored.   Do not shave  48 hours prior to surgery.           Do not bring valuables to the hospital. [place].   Contacts, dentures or bridgework may  not be worn into surgery.   Bring small overnight bag day of surgery.  Please read over the following fact sheets you were given: IF YOU HAVE QUESTIONS ABOUT YOUR PRE OP INSTRUCTIONS PLEASE CALL [place] - Preparing for Surgery Before surgery, you can play an important role.  Because skin is not sterile, your skin needs to be as free of germs as possible.  You can reduce the number of germs on your skin by washing with CHG (chlorahexidine gluconate) soap before surgery.  CHG is an antiseptic cleaner which kills germs and bonds with the skin to continue killing germs even after washing. Please DO NOT use if you have an allergy to CHG or antibacterial soaps.  If your skin becomes reddened/irritated stop using the CHG and inform your nurse when you arrive at Short Stay. Do not shave (including legs and underarms) for at least 48 hours prior to the first CHG shower.  You may shave your face/neck.  Please follow these instructions carefully:  1.  Shower with CHG Soap the night before surgery and the  morning of surgery.  2.  If you choose to wash your hair, wash your hair first as usual with your normal  shampoo.  3.  After you shampoo, rinse your hair and body thoroughly to remove the shampoo.  4.  Use CHG as you would any other liquid soap.  You can apply chg directly to the skin and wash.  Gently with a scrungie or clean washcloth.  5.  Apply the CHG Soap to your body ONLY FROM THE NECK DOWN.   Do   not use on face/ open                           Wound or open sores. Avoid contact with eyes, ears mouth and   genitals (private parts).                       Wash face,  Genitals (private parts) with your normal soap.             6.  Wash thoroughly, paying special attention to the area where your    surgery  will be performed.  7.  Thoroughly rinse your body with warm water from the neck down.  8.  DO NOT shower/wash with your normal soap after using  and rinsing off the CHG Soap.                9.  Pat yourself dry with a clean towel.            10.  Wear clean pajamas.            11.  Place clean sheets on your bed the night of your first shower and do not  sleep with pets. Day of Surgery : Do not apply any lotions/deodorants the morning of surgery.  Please wear clean clothes to the hospital/surgery center.  FAILURE TO FOLLOW THESE INSTRUCTIONS MAY RESULT IN THE CANCELLATION OF YOUR SURGERY  PATIENT SIGNATURE_________________________________  NURSE SIGNATURE__________________________________  ________________________________________________________________________   Adam Phenix  An incentive spirometer is a tool that can help keep your lungs clear and active. This tool measures how well you are filling your lungs with each breath. Taking long deep breaths may help reverse or decrease the chance of developing breathing (pulmonary) problems (especially infection) following: A long period of time when you are unable to move or be active. BEFORE THE PROCEDURE  If the spirometer includes an indicator to show your best effort, your nurse or respiratory therapist will set it to a desired goal. If possible, sit up straight or lean slightly forward. Try not to slouch. Hold the incentive spirometer in an upright position. INSTRUCTIONS FOR USE  Sit on the edge of your bed if possible, or sit up as far as you can in bed or on a chair. Hold the incentive spirometer in an upright position. Breathe out normally. Place the mouthpiece in your mouth and seal your lips tightly around it. Breathe in slowly and as deeply as possible, raising the piston or the ball toward the top of the column. Hold your breath for 3-5 seconds or for as long as possible. Allow the piston or ball to fall to the bottom of the column. Remove the mouthpiece from your mouth and breathe out normally. Rest for a few seconds and repeat Steps 1 through 7 at least 10  times every 1-2 hours when you are awake. Take your time and take a few normal breaths between deep breaths. The spirometer may include an indicator to show your best effort. Use the indicator as a goal to work toward during each repetition. After each set of 10 deep breaths, practice coughing to be sure  your lungs are clear. If you have an incision (the cut made at the time of surgery), support your incision when coughing by placing a pillow or rolled up towels firmly against it. Once you are able to get out of bed, walk around indoors and cough well. You may stop using the incentive spirometer when instructed by your caregiver.  RISKS AND COMPLICATIONS Take your time so you do not get dizzy or light-headed. If you are in pain, you may need to take or ask for pain medication before doing incentive spirometry. It is harder to take a deep breath if you are having pain. AFTER USE Rest and breathe slowly and easily. It can be helpful to keep track of a log of your progress. Your caregiver can provide you with a simple table to help with this. If you are using the spirometer at home, follow these instructions: [place] IF:  You are having difficultly using the spirometer. You have trouble using the spirometer as often as instructed. Your pain medication is not giving enough relief while using the spirometer. You develop fever of 100.5 F (38.1 C) or higher. SEEK IMMEDIATE MEDICAL CARE IF:  You cough up bloody sputum that had not been present before. You develop fever of 102 F (38.9 C) or greater. You develop worsening pain at or near the incision site. MAKE SURE YOU:  Understand these instructions. Will watch your condition. Will get help right away if you are not doing well or get worse. Document Released: 12/26/2006 Document Revised: 11/07/2011 Document Reviewed: 02/26/2007 ExitCare Patient Information 2014 ExitCare,  [place].   ________________________________________________________________________  WHAT IS A BLOOD TRANSFUSION? Blood Transfusion Information  A transfusion is the replacement of blood or some of its parts. Blood is made up of multiple cells which provide different functions. Red blood cells carry oxygen and are used for blood loss replacement. White blood cells fight against infection. Platelets control bleeding. Plasma helps clot blood. Other blood products are available for specialized needs, such as hemophilia or other clotting disorders. BEFORE THE TRANSFUSION  Who gives blood for transfusions?  Healthy volunteers who are fully evaluated to make sure their blood is safe. This is blood bank blood. Transfusion therapy is the safest it has ever been in the practice of medicine. Before blood is taken from a donor, a complete history is taken to make sure that person has no history of diseases nor engages in risky social behavior (examples are intravenous drug use or sexual activity with multiple partners). The donor's travel history is screened to minimize risk of transmitting infections, such as malaria. The donated blood is tested for signs of infectious diseases, such as HIV and hepatitis. The blood is then tested to be sure it is compatible with you in order to minimize the chance of a transfusion reaction. If you or a relative donates blood, this is often done in anticipation of surgery and is not appropriate for emergency situations. It takes many days to process the donated blood. RISKS AND COMPLICATIONS Although transfusion therapy is very safe and saves many lives, the main dangers of transfusion include:  Getting an infectious disease. Developing a transfusion reaction. This is an allergic reaction to something in the blood you were given. Every precaution is taken to prevent this. The decision to have a blood transfusion has been considered carefully by your caregiver before blood is  given. Blood is not given unless the benefits outweigh the risks. AFTER THE TRANSFUSION Right after receiving a blood  transfusion, you will usually feel much better and more energetic. This is especially true if your red blood cells have gotten low (anemic). The transfusion raises the level of the red blood cells which carry oxygen, and this usually causes an energy increase. The nurse administering the transfusion will monitor you carefully for complications. HOME CARE INSTRUCTIONS  No special instructions are needed after a transfusion. You may find your energy is better. Speak with your caregiver about any limitations on activity for underlying diseases you may have. SEEK MEDICAL CARE IF:  Your condition is not improving after your transfusion. You develop redness or irritation at the intravenous (IV) site. SEEK IMMEDIATE MEDICAL CARE IF:  Any of the following symptoms occur over the next 12 hours: Shaking chills. You have a temperature by mouth above 102 F (38.9 C), not controlled by medicine. Chest, back, or muscle pain. People around you feel you are not acting correctly or are confused. Shortness of breath or difficulty breathing. Dizziness and fainting. You get a rash or develop hives. You have a decrease in urine output. Your urine turns a dark color or changes to pink, red, or brown. Any of the following symptoms occur over the next 10 days: You have a temperature by mouth above 102 F (38.9 C), not controlled by medicine. Shortness of breath. Weakness after normal activity. The white part of the eye turns yellow (jaundice). You have a decrease in the amount of urine or are urinating less often. Your urine turns a dark color or changes to pink, red, or brown. Document Released: 08/12/2000 Document Revised: 11/07/2011 Document Reviewed: 03/31/2008 [facility] Patient Information 2014 [place], [place].  _______________________________________________________________________

## 2021-08-12 NOTE — Progress Notes (Addendum)
COVID swab appointment: 08-16-21   COVID Vaccine Completed: Yes x2 10-11-19 11-08-19 Date COVID Vaccine completed: Has received booster: Yes x1 07-21-20 COVID vaccine manufacturer:    Moderna      Date of COVID positive in last 90 days:  No   PCP - Allyn Kenner, MD (office note on chart) Cardiologist - Rozann Lesches, MD.  Last OV 2019   Chest x-ray - N/A EKG - 07-02-21 on chart Stress Test - greater than 2 years, Epic ECHO - 10-28-19 Epic Cardiac Cath - N/A Pacemaker/ICD device last checked: Spinal Cord Stimulator:   Sleep Study - N/A CPAP -    Fasting Blood Sugar - N/A Checks Blood Sugar _____ times a day   Blood Thinner Instructions:  N/A Aspirin Instructions: Last Dose:   Activity level:   Can go up a flight of stairs and perform activities of daily living without stopping and without symptoms of chest pain or shortness of breath.                         Anesthesia review:  Hx of chest pain, ?PFO.  BP elevated at PAT, patient had not taken BP meds prior to appt.  BP 165/100 and on repeat 157/95.  Patient denies headache, chest pain or SOB.   Patient denies shortness of breath, fever, cough and chest pain at PAT appointment     Patient verbalized understanding of instructions that were given to them at the PAT appointment. Patient was also instructed that they will need to review over the PAT instructions again at home before surgery.

## 2021-08-16 ENCOUNTER — Other Ambulatory Visit: Payer: Self-pay | Admitting: Orthopedic Surgery

## 2021-08-17 DIAGNOSIS — R0981 Nasal congestion: Secondary | ICD-10-CM | POA: Diagnosis not present

## 2021-08-17 DIAGNOSIS — U071 COVID-19: Secondary | ICD-10-CM | POA: Diagnosis not present

## 2021-08-17 LAB — SARS CORONAVIRUS 2 (TAT 6-24 HRS): SARS Coronavirus 2: POSITIVE — AB

## 2021-08-18 LAB — TYPE AND SCREEN
ABO/RH(D): AB POS
Antibody Screen: NEGATIVE

## 2021-08-20 ENCOUNTER — Other Ambulatory Visit (HOSPITAL_COMMUNITY): Payer: Self-pay

## 2021-09-03 DIAGNOSIS — M5451 Vertebrogenic low back pain: Secondary | ICD-10-CM | POA: Diagnosis not present

## 2021-09-07 ENCOUNTER — Ambulatory Visit: Payer: Medicare Other | Admitting: Adult Health

## 2021-09-09 DIAGNOSIS — M5416 Radiculopathy, lumbar region: Secondary | ICD-10-CM | POA: Diagnosis not present

## 2021-10-11 DIAGNOSIS — M5416 Radiculopathy, lumbar region: Secondary | ICD-10-CM | POA: Diagnosis not present

## 2021-10-18 ENCOUNTER — Telehealth: Payer: Self-pay | Admitting: Internal Medicine

## 2021-10-18 DIAGNOSIS — Z6841 Body Mass Index (BMI) 40.0 and over, adult: Secondary | ICD-10-CM | POA: Diagnosis not present

## 2021-10-18 MED ORDER — DICYCLOMINE HCL 10 MG PO CAPS: ORAL_CAPSULE | ORAL | 3 refills | Status: DC

## 2021-10-18 NOTE — Telephone Encounter (Signed)
Noted  Pt advised of refill on her vm (pt sent call straight to vm)

## 2021-10-18 NOTE — Telephone Encounter (Signed)
Completed.

## 2021-10-18 NOTE — Telephone Encounter (Signed)
Pt has questions about medications, switching pharmacies and needing refills. I transferred call to nurse's VM.

## 2021-10-18 NOTE — Telephone Encounter (Signed)
Annette Saunders, The pt wants new Rx for Dicyclomine 10 mg , 120 pills sent to [facility] in [place], [place]. Pt states she has no refills. Please advise.

## 2021-10-18 NOTE — Addendum Note (Signed)
Addended by: Annitta Needs on: 10/18/2021 10:42 AM   Modules accepted: Orders

## 2021-10-19 ENCOUNTER — Ambulatory Visit: Payer: Medicare Other | Admitting: Gastroenterology

## 2021-10-20 DIAGNOSIS — B078 Other viral warts: Secondary | ICD-10-CM | POA: Diagnosis not present

## 2021-10-20 DIAGNOSIS — L820 Inflamed seborrheic keratosis: Secondary | ICD-10-CM | POA: Diagnosis not present

## 2021-11-09 DIAGNOSIS — Z0289 Encounter for other administrative examinations: Secondary | ICD-10-CM

## 2021-11-09 DIAGNOSIS — Z20822 Contact with and (suspected) exposure to covid-19: Secondary | ICD-10-CM | POA: Diagnosis not present

## 2021-11-10 ENCOUNTER — Other Ambulatory Visit: Payer: Self-pay

## 2021-11-10 ENCOUNTER — Telehealth (INDEPENDENT_AMBULATORY_CARE_PROVIDER_SITE_OTHER): Payer: Medicare Other | Admitting: Gastroenterology

## 2021-11-10 ENCOUNTER — Telehealth: Payer: Self-pay | Admitting: *Deleted

## 2021-11-10 DIAGNOSIS — K219 Gastro-esophageal reflux disease without esophagitis: Secondary | ICD-10-CM | POA: Diagnosis not present

## 2021-11-10 DIAGNOSIS — Z20822 Contact with and (suspected) exposure to covid-19: Secondary | ICD-10-CM | POA: Diagnosis not present

## 2021-11-10 NOTE — Patient Instructions (Signed)
I am glad you are doing well! ? ?We will see you back in 1 year or sooner if needed! ? ? ?Annitta Needs, PhD, ANP-BC ?[facility]  ? ?

## 2021-11-10 NOTE — Telephone Encounter (Signed)
Annette Saunders, you are scheduled for a virtual visit with your provider today.  Just as we do with appointments in the office, we must obtain your consent to participate.  Your consent will be active for this visit and any virtual visit you may have with one of our providers in the next 365 days.  If you have a MyChart account, I can also send a copy of this consent to you electronically.  All virtual visits are billed to your insurance company just like a traditional visit in the office.  As this is a virtual visit, video technology does not allow for your provider to perform a traditional examination.  This may limit your provider's ability to fully assess your condition.  If your provider identifies any concerns that need to be evaluated in person or the need to arrange testing such as labs, EKG, etc, we will make arrangements to do so.  Although advances in technology are sophisticated, we cannot ensure that it will always work on either your end or our end.  If the connection with a video visit is poor, we may have to switch to a telephone visit.  With either a video or telephone visit, we are not always able to ensure that we have a secure connection.   I need to obtain your verbal consent now.   Are you willing to proceed with your visit today?  ?

## 2021-11-10 NOTE — Telephone Encounter (Signed)
Pt consented to a virtual visit. 

## 2021-11-10 NOTE — Progress Notes (Signed)
? ?Primary Care Physician:  Celene Squibb, MD  ?Primary GI: Dr. Abbey Chatters ? ?Patient Location: Home ?  ?Provider Location: [place] office ?  ?Reason for Visit: Follow-up ?  ?Persons present on the virtual encounter, with roles: Patient and NP ?  ?Total time (minutes) spent on medical discussion: 10 minutes ?  ?Due to COVID-19, visit was conducted using virtual method.  Visit was requested by patient. ? ?Virtual Visit via Telephone Note (video not available) ?Due to COVID-19, visit is conducted virtually and was requested by patient.  ? ?I connected with Annette Saunders on 11/10/21 at  1:00 PM EDT by telephone and verified that I am speaking with the correct person using two identifiers. ?  ?I discussed the limitations, risks, security and privacy concerns of performing an evaluation and management service by telephone and the availability of in person appointments. I also discussed with the patient that there may be a patient responsible charge related to this service. The patient expressed understanding and agreed to proceed. ? ?Chief Complaint  ?Patient presents with  ? Follow-up  ? ? ? ?History of Present Illness: ? ?Very pleasant 76 year old female with history of GERD, dysphagia,BPE April 2019 with prior fundoplication without recurrent hiatal hernia, otherwise normal. 2022 colonoscopy with tubular adenoma. EGD 2022 with benign-appearing esophageal stenosis, s/p dilation, gastritis s/p biopsy, normal duodenum. Biopsy with mild hyperemia, negative H.pylori. Here for routine follow-up. ? ?Burping improved. Less "bubbles" at night. Nexium BID working well. Pepcid as needed at night. From a GI standpoint doing well. Has had chronic lower abdominal discomfort previously evaluated. Epidural helped with bilateral lower abdominal pain. Upcoming total right knee replacement 4/19 by Dr. Wynelle Link. ? ?Dicyclomine just as needed for spasms.  ?Past Medical History:  ?Diagnosis Date  ? Anxiety   ? Essential hypertension, benign  06/06/2019  ? Generalized osteoarthritis   ? GERD (gastroesophageal reflux disease)   ? Hyperlipidemia   ? Hypothyroidism, adult 06/06/2019  ? Insomnia   ? Malaise and fatigue 06/06/2019  ? Obesity (BMI 30.0-34.9) 06/06/2019  ? ? ? ?Past Surgical History:  ?Procedure Laterality Date  ? BALLOON DILATION N/A 12/08/2020  ? Procedure: BALLOON DILATION;  Surgeon: Eloise Harman, DO;  [facility];  Service: Endoscopy;  Laterality: N/A;  ? BIOPSY  12/08/2020  ? Procedure: BIOPSY;  Surgeon: Eloise Harman, DO;  [facility];  Service: Endoscopy;;  gastric  ? COLONOSCOPY  10/2014  ? Dr. Cristine Polio, outside hospital. Colonoscopy performed with Propofol, no polyps, small to medium size internal hemorrhoids noted.   ? COLONOSCOPY WITH PROPOFOL N/A 12/08/2020  ? fair prep, non-bleeding internal hemorrhoids, one 2 mm polyp in transverse colon (tubular adenoma)  ? ECTOPIC PREGNANCY SURGERY    ? ELBOW FRACTURE SURGERY    ? ESOPHAGOGASTRODUODENOSCOPY (EGD) WITH PROPOFOL N/A 12/08/2020  ? benign-appearing esophageal stenosis, s/p dilation, gastritis s/p biopsy, normal duodenum. Biopsy with mild hyperemia, negative H.pylori  ? HIATAL HERNIA REPAIR    ? JOINT REPLACEMENT    ? bilateral knee  ? LIPOMA EXCISION Right 08/26/2020  ? Procedure: MINOR EXCISION LIPOMA;THIGH;  Surgeon: Virl Cagey, MD;  Location: [facility];  Service: General;  Laterality: Right;  ? POLYPECTOMY  12/08/2020  ? Procedure: POLYPECTOMY;  Surgeon: Eloise Harman, DO;  [facility]: [facility];  Service: Endoscopy;;  ? TONSILLECTOMY    ? ? ? ?Current Meds  ?Medication Sig  ? atorvastatin (LIPITOR) 20 MG tablet Take 20 mg by mouth in the morning.  ?  dicyclomine (BENTYL) 10 MG capsule TAKE 1 CAPSULE(10 MG) BY MOUTH TWICE DAILY AS NEEDED  ? escitalopram (LEXAPRO) 10 MG tablet TAKE 1 TABLET BY MOUTH EVERY DAY (Patient taking differently: Take 10 mg by mouth at bedtime.)  ? esomeprazole (NEXIUM) 40 MG capsule Take 1 capsule (40 mg total) by mouth  2 (two) times daily before a meal.  ? famotidine (PEPCID) 20 MG tablet TAKE 1 TABLET(20 MG) BY MOUTH AT BEDTIME  ? hydrochlorothiazide (MICROZIDE) 12.5 MG capsule Take 12.5 mg by mouth at bedtime.  ? Melatonin 10 MG TABS Take 20 mg by mouth at bedtime.  ? Multiple Vitamin (MULTIVITAMIN WITH MINERALS) TABS tablet Take 1 tablet by mouth daily.  ? olmesartan (BENICAR) 20 MG tablet Take 20 mg by mouth daily.  ?  ? ?Family History  ?Problem Relation Age of Onset  ? Colon cancer Neg Hx   ? ? ?Social History  ? ?Socioeconomic History  ? Marital status: Widowed  ?  Spouse name: Not on file  ? Number of children: Not on file  ? Years of education: Not on file  ? Highest education level: Not on file  ?Occupational History  ? Occupation: retired  ?Tobacco Use  ? Smoking status: Never  ? Smokeless tobacco: Never  ?Vaping Use  ? Vaping Use: Never used  ?Substance and Sexual Activity  ? Alcohol use: No  ?  Alcohol/week: 0.0 standard drinks  ? Drug use: No  ? Sexual activity: Not Currently  ?  Birth control/protection: Post-menopausal  ?Other Topics Concern  ? Not on file  ?Social History Narrative  ? Widow for 2 years-husband died of MI.Lives alone.  ? ?Social Determinants of Health  ? ?Financial Resource Strain: Low Risk   ? Difficulty of Paying Living Expenses: Not hard at all  ?Food Insecurity: No Food Insecurity  ? Worried About Charity fundraiser in the Last Year: Never true  ? Ran Out of Food in the Last Year: Never true  ?Transportation Needs: No Transportation Needs  ? Lack of Transportation (Medical): No  ? Lack of Transportation (Non-Medical): No  ?Physical Activity: Inactive  ? Days of Exercise per Week: 0 days  ? Minutes of Exercise per Session: 0 min  ?Stress: No Stress Concern Present  ? Feeling of Stress : Not at all  ?Social Connections: Moderately Isolated  ? Frequency of Communication with Friends and Family: More than three times a week  ? Frequency of Social Gatherings with Friends and Family: Three times a  week  ? Attends Religious Services: 1 to 4 times per year  ? Active Member of Clubs or Organizations: No  ? Attends Archivist Meetings: Never  ? Marital Status: Widowed  ? ? ? ? ? Review of Systems: ?Gen: Denies fever, chills, anorexia. Denies fatigue, weakness, weight loss.  ?CV: Denies chest pain, palpitations, syncope, peripheral edema, and claudication. ?Resp: Denies dyspnea at rest, cough, wheezing, coughing up blood, and pleurisy. ?GI: see HPI ?Derm: Denies rash, itching, dry skin ?Psych: Denies depression, anxiety, memory loss, confusion. No homicidal or suicidal ideation.  ?Heme: Denies bruising, bleeding, and enlarged lymph nodes. ? ?Observations/Objective: ?No distress. Unable to perform physical exam due to telephone encounter. No video available.  ? ?Assessment and Plan: ?Annette Saunders is a 76 y.o. female presenting today for routine follow-up with history of GERD and dysphagia. EGD/colonoscopy on file from last year.  ? ?Doing well with Nexium BID and Pepcid prn. Overall, symptoms improved from last visit.  ? ?  Upcoming total right knee replacement 12/15/21.  ? ?As she is doing well, we will see her back in 1 year. She is to call if concerns in interim. Next colonoscopy in 2027 if health permits.  ? ?Follow Up Instructions: ?Return in 1 year ?  ?I discussed the assessment and treatment plan with the patient. The patient was provided an opportunity to ask questions and all were answered. The patient agreed with the plan and demonstrated an understanding of the instructions. ?  ?The patient was advised to call back or seek an in-person evaluation if the symptoms worsen or if the condition fails to improve as anticipated. ? ?I provided 10 minutes of non face-to-face time during this telephone encounter.  ? ?Annitta Needs, PhD, ANP-BC ?[facility]  ? ? ? ? ?

## 2021-11-17 ENCOUNTER — Ambulatory Visit (INDEPENDENT_AMBULATORY_CARE_PROVIDER_SITE_OTHER): Payer: Medicare Other | Admitting: Family Medicine

## 2021-11-17 ENCOUNTER — Other Ambulatory Visit: Payer: Self-pay

## 2021-11-17 ENCOUNTER — Encounter (INDEPENDENT_AMBULATORY_CARE_PROVIDER_SITE_OTHER): Payer: Self-pay | Admitting: Family Medicine

## 2021-11-17 VITALS — BP 155/79 | HR 89 | Temp 97.7°F | Ht 60.0 in | Wt 220.0 lb

## 2021-11-17 DIAGNOSIS — Z6841 Body Mass Index (BMI) 40.0 and over, adult: Secondary | ICD-10-CM

## 2021-11-17 DIAGNOSIS — E559 Vitamin D deficiency, unspecified: Secondary | ICD-10-CM

## 2021-11-17 DIAGNOSIS — R0602 Shortness of breath: Secondary | ICD-10-CM

## 2021-11-17 DIAGNOSIS — K219 Gastro-esophageal reflux disease without esophagitis: Secondary | ICD-10-CM | POA: Diagnosis not present

## 2021-11-17 DIAGNOSIS — F39 Unspecified mood [affective] disorder: Secondary | ICD-10-CM

## 2021-11-17 DIAGNOSIS — R5383 Other fatigue: Secondary | ICD-10-CM

## 2021-11-17 DIAGNOSIS — E7849 Other hyperlipidemia: Secondary | ICD-10-CM

## 2021-11-17 DIAGNOSIS — I10 Essential (primary) hypertension: Secondary | ICD-10-CM

## 2021-11-17 DIAGNOSIS — R739 Hyperglycemia, unspecified: Secondary | ICD-10-CM

## 2021-11-17 DIAGNOSIS — E669 Obesity, unspecified: Secondary | ICD-10-CM | POA: Diagnosis not present

## 2021-11-17 DIAGNOSIS — E66813 Obesity, class 3: Secondary | ICD-10-CM

## 2021-11-18 LAB — COMPREHENSIVE METABOLIC PANEL
ALT: 21 IU/L (ref 0–32)
AST: 32 IU/L (ref 0–40)
Albumin/Globulin Ratio: 1.3 (ref 1.2–2.2)
Albumin: 4.3 g/dL (ref 3.7–4.7)
Alkaline Phosphatase: 106 IU/L (ref 44–121)
BUN/Creatinine Ratio: 21 (ref 12–28)
BUN: 26 mg/dL (ref 8–27)
Bilirubin Total: 0.2 mg/dL (ref 0.0–1.2)
CO2: 22 mmol/L (ref 20–29)
Calcium: 9.7 mg/dL (ref 8.7–10.3)
Chloride: 104 mmol/L (ref 96–106)
Creatinine, Ser: 1.22 mg/dL — ABNORMAL HIGH (ref 0.57–1.00)
Globulin, Total: 3.4 g/dL (ref 1.5–4.5)
Glucose: 111 mg/dL — ABNORMAL HIGH (ref 70–99)
Potassium: 4.9 mmol/L (ref 3.5–5.2)
Sodium: 143 mmol/L (ref 134–144)
Total Protein: 7.7 g/dL (ref 6.0–8.5)
eGFR: 46 mL/min/{1.73_m2} — ABNORMAL LOW (ref >59)

## 2021-11-18 LAB — CBC WITH DIFFERENTIAL/PLATELET
Basophils Absolute: 0.1 10*3/uL (ref 0.0–0.2)
Basos: 1 %
EOS (ABSOLUTE): 0.2 10*3/uL (ref 0.0–0.4)
Eos: 3 %
Hematocrit: 36.5 % (ref 34.0–46.6)
Hemoglobin: 12.2 g/dL (ref 11.1–15.9)
Immature Grans (Abs): 0 10*3/uL (ref 0.0–0.1)
Immature Granulocytes: 0 %
Lymphocytes Absolute: 3.5 10*3/uL — ABNORMAL HIGH (ref 0.7–3.1)
Lymphs: 44 %
MCH: 29.4 pg (ref 26.6–33.0)
MCHC: 33.4 g/dL (ref 31.5–35.7)
MCV: 88 fL (ref 79–97)
Monocytes Absolute: 0.6 10*3/uL (ref 0.1–0.9)
Monocytes: 8 %
Neutrophils Absolute: 3.7 10*3/uL (ref 1.4–7.0)
Neutrophils: 44 %
Platelets: 309 10*3/uL (ref 150–450)
RBC: 4.15 x10E6/uL (ref 3.77–5.28)
RDW: 13 % (ref 11.7–15.4)
WBC: 8.1 10*3/uL (ref 3.4–10.8)

## 2021-11-18 LAB — INSULIN, RANDOM: INSULIN: 18.2 u[IU]/mL (ref 2.6–24.9)

## 2021-11-18 LAB — VITAMIN B12: Vitamin B-12: 588 pg/mL (ref 232–1245)

## 2021-11-18 LAB — LIPID PANEL
Chol/HDL Ratio: 2.6 ratio (ref 0.0–4.4)
Cholesterol, Total: 187 mg/dL (ref 100–199)
HDL: 71 mg/dL (ref >39)
LDL Chol Calc (NIH): 86 mg/dL (ref 0–99)
Triglycerides: 182 mg/dL — ABNORMAL HIGH (ref 0–149)
VLDL Cholesterol Cal: 30 mg/dL (ref 5–40)

## 2021-11-18 LAB — FOLATE: Folate: >20 ng/mL (ref >3.0)

## 2021-11-18 LAB — HEMOGLOBIN A1C
Est. average glucose Bld gHb Est-mCnc: 128 mg/dL
Hgb A1c MFr Bld: 6.1 % — ABNORMAL HIGH (ref 4.8–5.6)

## 2021-11-18 LAB — T4, FREE: Free T4: 0.87 ng/dL (ref 0.82–1.77)

## 2021-11-18 LAB — TSH: TSH: 6.08 u[IU]/mL — ABNORMAL HIGH (ref 0.450–4.500)

## 2021-11-18 LAB — VITAMIN D 25 HYDROXY (VIT D DEFICIENCY, FRACTURES): Vit D, 25-Hydroxy: 35.7 ng/mL (ref 30.0–100.0)

## 2021-11-24 NOTE — Progress Notes (Signed)
? ? ? ? ?Chief Complaint:  ? ?OBESITY ?Annette Saunders (MR# [id number]) is a 76 y.o. female who presents for evaluation and treatment of obesity and related comorbidities. Current Body mass index is 42.97 kg/m?[name] Annette Saunders has been struggling with her weight for many years and has been unsuccessful in either losing weight, maintaining weight loss, or reaching her healthy weight goal. ? ?Annette Saunders is currently in the action stage of change and ready to dedicate time achieving and maintaining a healthier weight. Annette Saunders is interested in becoming our patient and working on intensive lifestyle modifications including (but not limited to) diet and exercise for weight loss. ? 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  Lives along.  She is from [place], [place].  Craves sweets, bread, pasta, bagels, ice cream.  Skips breakfast.  Her worst habit is eating after 6 pm.  Sent here by Dr. Wynelle Link of Ortho as she needs a new nee.  Scheduled at the end of April. ? ?Favour's habits were reviewed today and are as follows: her desired weight loss is 76 pounds, she started gaining weight after pregnancy, her heaviest weight ever was 221 pounds, she sweets, bread, macaroni, ice cream, bagels, she snacks frequently in the evenings, she skips breakfast frequently, she is frequently drinking liquids with calories, she frequently makes poor food choices, and she struggles with emotional eating. ? ?Depression Screen ?Annette Saunders's Food and Mood (modified PHQ-9) score was 8. ? ? ?  11/17/2021  ? 10:36 AM  ?Depression screen PHQ 2/9  ?Decreased Interest 2  ?Down, Depressed, Hopeless 1  ?PHQ - 2 Score 3  ?Altered sleeping 1  ?Tired, decreased energy 2  ?Change in appetite 0  ?Feeling bad or failure about yourself  0  ?Trouble concentrating 2  ?Moving slowly or fidgety/restless 0  ?Suicidal thoughts 0  ?PHQ-9 Score 8  ?Difficult doing work/chores Not difficult at all  ? ?Subjective:  ? ?1. Other fatigue ?Annette Saunders denies daytime somnolence and denies waking  up still tired. Patient has a history of symptoms of morning headache. Maesyn generally gets 7 hours of sleep per night, and states that she has poor sleep quality. Snoring is not present. Apneic episodes are not present. Epworth Sleepiness Score is 5. Patient denies issues with sleep. ? ?2. SOBOE (shortness of breath on exertion) ?Annette Saunders notes increasing shortness of breath with exercising and seems to be worsening over time with weight gain. She notes getting out of breath sooner with activity than she used to. This has gotten worse recently. Caitlinn denies shortness of breath at rest or orthopnea. ECG showed NSR with a rate of 80, low voltage due to body habitus only.   ? ?3. Other hyperlipidemia ?Annette Saunders is taking Lipitor QHS. ? ?4. Essential hypertension ?Asymptomatic.  No concerns or issues.  She is taking HCTZ and olmesartan. ? ?5. Gastroesophageal reflux disease, unspecified whether esophagitis present ?Symptoms controlled a current.  She is taking famotidine and esomeprazole.  ? ?6. Vitamin D deficiency disease ?She endorsed fatigue/tireness.  Last bone density in 2020.  No OTC supplement at this time. ? ?7. Hyperglycemia ?She has history of elevated blood glucose in the past.  Never had A2c or FI done. ? ?8. Mood disorder ([place]) with emotional eating ?With anxiety.  PHQ is 8.  She is taking Lexapro. ? ?Assessment/Plan:  ? ?Orders Placed This Encounter  ?Procedures  ? Vitamin B12  ? CBC with Differential/Platelet  ? Comprehensive metabolic panel  ? Folate  ? Hemoglobin A1c  ? Insulin, random  ? Lipid  panel  ? T4, free  ? TSH  ? VITAMIN D 25 Hydroxy (Vit-D Deficiency, Fractures)  ? EKG 12-Lead  ? ? ?There are no discontinued medications.  ? ?No orders of the defined types were placed in this encounter. ?  ? ?1. Other fatigue ?Annette Saunders does feel that her weight is causing her energy to be lower than it should be. Fatigue may be related to obesity, depression or many other causes. Labs will be ordered,  and in the meanwhile, Annette Saunders will focus on self care including making healthy food choices, increasing physical activity and focusing on stress reduction. ? ?- EKG 12-Lead ?- Vitamin B12 ?- Folate ? ?2. SOBOE (shortness of breath on exertion) ?Annette Saunders does feel that she gets out of breath more easily that she used to when she exercises. Annette Saunders's shortness of breath appears to be obesity related and exercise induced. She has agreed to work on weight loss and gradually increase exercise to treat her exercise induced shortness of breath. Will continue to monitor closely. ? ?3. Other hyperlipidemia ?Check labs - FLP, CMP, FI.  Follow prudent nutritional plan.  Keep diet low in salt and trans fat. ? ?- CBC with Differential/Platelet ?- Comprehensive metabolic panel ?- Lipid panel ? ?4. Essential hypertension ?Decrease salt intake.  Follow prudent nutritional plan and lose weight.  Check CBC, B12, CMP, free T4, TSH.  BP elevated today slightly, patient nervous for her first appointment.  Follow-up closely in 2 weeks, and if still elevated, will contact PCP. ? ?- Vitamin B12 ?- CBC with Differential/Platelet ?- Comprehensive metabolic panel ?- Folate ?- T4, free ?- TSH ? ?5. Gastroesophageal reflux disease, unspecified whether esophagitis present ?Advised not to eat within 3 hours of lying down at night.  Avoid trigger foods, follow prudent nutritional plan and lose weight. ? ?6. Vitamin D deficiency disease ?Check vitamin D level.  Patient is osteopenic on bone density test in 2020.  Needs repeat every 2 years.  Eventually we will encourage increasing activity. ? ?- VITAMIN D 25 Hydroxy (Vit-D Deficiency, Fractures) ? ?7. Hyperglycemia ?Will check A1c and FI. ? ?- Hemoglobin A1c ?- Insulin, random ? ?8. Mood disorder ([place]) with emotional eating ?Stable currently.  Denies issues with emotional eating at this time.  Will monitor closely and refer for counseling as needed in the future. ? ?9. Obesity with current BMI of  43.1 ? ?Annette Saunders is currently in the action stage of change and her goal is to continue with weight loss efforts. I recommend Annette Saunders begin the structured treatment plan as follows: ? ?She has agreed to the Category 1 Plan with lunch options. ? ?Exercise goals:  As is.   ? ?Behavioral modification strategies: increasing lean protein intake, decreasing simple carbohydrates, and planning for success. ? ?She was informed of the importance of frequent follow-up visits to maximize her success with intensive lifestyle modifications for her multiple health conditions. She was informed we would discuss her lab results at her next visit unless there is a critical issue that needs to be addressed sooner. Annette Saunders agreed to keep her next visit at the agreed upon time to discuss these results. ? ?Objective:  ? ?Blood pressure (!) 155/79, pulse 89, temperature 97.7 ?F (36.5 ?C), height 5' (1.524 m), weight 220 lb (99.8 kg), SpO2 96 %. Body mass index is 42.97 kg/m?. ? ?EKG: Normal sinus rhythm, rate 81 bpm. ? ?Indirect Calorimeter completed today shows a VO2 of 229 and a REE of 1584.  Her calculated basal metabolic rate  is 1532 thus her basal metabolic rate is better than expected. ? ?General: Cooperative, alert, well developed, in no acute distress. ?HEENT: Conjunctivae and lids unremarkable. ?Cardiovascular: Regular rhythm.  ?Lungs: Normal work of breathing. ?Neurologic: No focal deficits.  ? ?Lab Results  ?Component Value Date  ? CREATININE 1.22 (H) 11/17/2021  ? BUN 26 11/17/2021  ? NA 143 11/17/2021  ? K 4.9 11/17/2021  ? CL 104 11/17/2021  ? CO2 22 11/17/2021  ? ?Lab Results  ?Component Value Date  ? ALT 21 11/17/2021  ? AST 32 11/17/2021  ? ALKPHOS 106 11/17/2021  ? BILITOT 0.2 11/17/2021  ? ?Lab Results  ?Component Value Date  ? HGBA1C 6.1 (H) 11/17/2021  ? ?Lab Results  ?Component Value Date  ? INSULIN 18.2 11/17/2021  ? ?Lab Results  ?Component Value Date  ? TSH 6.080 (H) 11/17/2021  ? ?Lab Results  ?Component Value  Date  ? CHOL 187 11/17/2021  ? HDL 71 11/17/2021  ? [place] 86 11/17/2021  ? TRIG 182 (H) 11/17/2021  ? CHOLHDL 2.6 11/17/2021  ? ?Lab Results  ?Component Value Date  ? WBC 8.1 11/17/2021  ? HGB 12.2 0

## 2021-11-26 ENCOUNTER — Institutional Professional Consult (permissible substitution): Payer: Medicare Other | Admitting: Plastic Surgery

## 2021-11-29 DIAGNOSIS — I10 Essential (primary) hypertension: Secondary | ICD-10-CM | POA: Diagnosis not present

## 2021-11-29 DIAGNOSIS — K219 Gastro-esophageal reflux disease without esophagitis: Secondary | ICD-10-CM | POA: Diagnosis not present

## 2021-11-29 DIAGNOSIS — Z96651 Presence of right artificial knee joint: Secondary | ICD-10-CM | POA: Diagnosis not present

## 2021-11-29 DIAGNOSIS — Z6841 Body Mass Index (BMI) 40.0 and over, adult: Secondary | ICD-10-CM | POA: Diagnosis not present

## 2021-11-29 DIAGNOSIS — N1831 Chronic kidney disease, stage 3a: Secondary | ICD-10-CM | POA: Diagnosis not present

## 2021-11-29 DIAGNOSIS — F5104 Psychophysiologic insomnia: Secondary | ICD-10-CM | POA: Diagnosis not present

## 2021-11-29 DIAGNOSIS — R7401 Elevation of levels of liver transaminase levels: Secondary | ICD-10-CM | POA: Diagnosis not present

## 2021-11-29 DIAGNOSIS — R600 Localized edema: Secondary | ICD-10-CM | POA: Diagnosis not present

## 2021-11-29 DIAGNOSIS — K449 Diaphragmatic hernia without obstruction or gangrene: Secondary | ICD-10-CM | POA: Diagnosis not present

## 2021-11-29 DIAGNOSIS — E782 Mixed hyperlipidemia: Secondary | ICD-10-CM | POA: Diagnosis not present

## 2021-11-29 DIAGNOSIS — Z96652 Presence of left artificial knee joint: Secondary | ICD-10-CM | POA: Diagnosis not present

## 2021-12-01 NOTE — Progress Notes (Signed)
DUE TO COVID-19 ONLY  2VISITOR IS ALLOWED TO COME WITH YOU AND STAY IN THE WAITING ROOM ONLY DURING PRE OP AND PROCEDURE DAY OF SURGERY.  24VISITOR  MAY VISIT WITH YOU AFTER SURGERY IN YOUR PRIVATE ROOM DURING VISITING HOURS ONLY! ?YOU MAY HAVE ONE PERSON SPEND THE NITE WITH YOU IN YOUR ROOM AFTER SURGERY.   ? ? Your procedure is scheduled on:  ?           12/15/2021  ? Report to [facility] Main  Entrance ? ? Report to admitting at   1100             AM ?DO NOT BRING INSURANCE CARD, PICTURE ID OR WALLET DAY OF SURGERY.  ?  ? ? Call this number if you have problems the morning of surgery [phone]  ? ? REMEMBER: NO  SOLID FOODS , CANDY, GUM OR MINTS AFTER MIDNITE THE NITE BEFORE SURGERY .       [name] CLEAR LIQUIDS UNTIL        1045am         DAY OF SURGERY.      PLEASE FINISH ENSURE DRINK PER SURGEON ORDER  WHICH NEEDS TO BE COMPLETED AT   [facility] [facility]      MORNING OF SURGERY.   ? ? ? ? ?CLEAR LIQUID DIET ? ? ?Foods Allowed      ?WATER ?BLACK COFFEE ( SUGAR OK, NO MILK, CREAM OR CREAMER) REGULAR AND DECAF  ?TEA ( SUGAR OK NO MILK, CREAM, OR CREAMER) REGULAR AND DECAF  ?PLAIN JELLO ( NO RED)  ?FRUIT ICES ( NO RED, NO FRUIT PULP)  ?POPSICLES ( NO RED)  ?JUICE- APPLE, WHITE GRAPE AND WHITE CRANBERRY  ?SPORT DRINK LIKE GATORADE ( NO RED)  ?CLEAR BROTH ( VEGETABLE , CHICKEN OR BEEF)                                                               ? ?    ? ?BRUSH YOUR TEETH MORNING OF SURGERY AND RINSE YOUR MOUTH OUT, NO CHEWING GUM CANDY OR MINTS. ?  ? ? Take these medicines the morning of surgery with A SIP OF WATER:  nexium ? ? ? ?DO NOT TAKE ANY DIABETIC MEDICATIONS DAY OF YOUR SURGERY ?                  ?            You may not have any metal on your body including hair pins and  ?            piercings  Do not wear jewelry, make-up, lotions, powders or perfumes, deodorant ?            Do not wear nail polish on your fingernails.   ?           IF YOU ARE A FEMALE AND WANT TO SHAVE UNDER ARMS OR LEGS PRIOR TO SURGERY  YOU MUST DO SO AT LEAST 48 HOURS PRIOR TO SURGERY.  ?            Men may shave face and neck. ? ? Do not bring valuables to the hospital. [facility] NOT ?  RESPONSIBLE   FOR VALUABLES. ? Contacts, dentures or bridgework may not be worn into surgery. ? Leave suitcase in the car. After surgery it may be brought to your room. ? ?  ? Patients discharged the day of surgery will not be allowed to drive home. IF YOU ARE HAVING SURGERY AND GOING HOME THE SAME DAY, YOU MUST HAVE AN ADULT TO DRIVE YOU HOME AND BE WITH YOU FOR 24 HOURS. YOU MAY GO HOME BY TAXI OR UBER OR ORTHERWISE, BUT AN ADULT MUST ACCOMPANY YOU HOME AND STAY WITH YOU FOR 24 HOURS. ?  ? ?            Please read over the following fact sheets you were given: ?_____________________________________________________________________ ? ?[facility] - Preparing for Surgery ?Before surgery, you can play an important role.  Because skin is not sterile, your skin needs to be as free of germs as possible.  You can reduce the number of germs on your skin by washing with CHG (chlorahexidine gluconate) soap before surgery.  CHG is an antiseptic cleaner which kills germs and bonds with the skin to continue killing germs even after washing. ?Please DO NOT use if you have an allergy to CHG or antibacterial soaps.  If your skin becomes reddened/irritated stop using the CHG and inform your nurse when you arrive at Short Stay. ?Do not shave (including legs and underarms) for at least 48 hours prior to the first CHG shower.  You may shave your face/neck. ?Please follow these instructions carefully: ? 1.  Shower with CHG Soap the night before surgery and the  morning of Surgery. ? 2.  If you choose to wash your hair, wash your hair first as usual with your  normal  shampoo. ? 3.  After you shampoo, rinse your hair and body thoroughly to remove the  shampoo.                           4.  Use CHG as you would any other liquid soap.  You can apply chg directly  to the  skin and wash  ?                     Gently with a scrungie or clean washcloth. ? 5.  Apply the CHG Soap to your body ONLY FROM THE NECK DOWN.   Do not use on face/ open      ?                     Wound or open sores. Avoid contact with eyes, ears mouth and genitals (private parts).  ?                     Production manager,  Genitals (private parts) with your normal soap. ?            6.  Wash thoroughly, paying special attention to the area where your surgery  will be performed. ? 7.  Thoroughly rinse your body with warm water from the neck down. ? 8.  DO NOT shower/wash with your normal soap after using and rinsing off  the CHG Soap. ?               9.  Pat yourself dry with a clean towel. ?           10.  Wear clean pajamas. ?  11.  Place clean sheets on your bed the night of your first shower and do not  sleep with pets. ?Day of Surgery : ?Do not apply any lotions/deodorants the morning of surgery.  Please wear clean clothes to the hospital/surgery center. ? ?FAILURE TO FOLLOW THESE INSTRUCTIONS MAY RESULT IN THE CANCELLATION OF YOUR SURGERY ?PATIENT SIGNATURE_________________________________ ? ?NURSE SIGNATURE__________________________________ ? ?________________________________________________________________________  ? ? ?           ?

## 2021-12-01 NOTE — Progress Notes (Addendum)
Anesthesia Review: ? ?PCP:- Weight Loss MD  Neoma Laming Opalski LOV 11/17/21  ?PCP- Valentino Nose- Clearance 05/27/21 ?LOV 11/22  ?Cardiologist : ?Chest x-ray : ?EKG :11/17/21  ?Echo : 2021  ?Stress test: 2019  ?Cardiac Cath :  ?Activity level: can do a flight of stairs without difficulty  ?Sleep Study/ CPAP : none  ?Fasting Blood Sugar :      / Checks Blood Sugar -- times a day:   ?Blood Thinner/ Instructions /Last Dose: ?ASA / Instructions/ Last Dose :   ?CBC and CMp and hgba1c-11/17/21- in epic  ?

## 2021-12-02 ENCOUNTER — Encounter (HOSPITAL_COMMUNITY): Payer: Medicare Other

## 2021-12-02 ENCOUNTER — Ambulatory Visit (INDEPENDENT_AMBULATORY_CARE_PROVIDER_SITE_OTHER): Payer: Medicare Other | Admitting: Family Medicine

## 2021-12-02 ENCOUNTER — Encounter (INDEPENDENT_AMBULATORY_CARE_PROVIDER_SITE_OTHER): Payer: Self-pay | Admitting: Family Medicine

## 2021-12-02 VITALS — BP 147/85 | HR 90 | Temp 97.4°F | Ht 60.0 in | Wt 217.0 lb

## 2021-12-02 DIAGNOSIS — I10 Essential (primary) hypertension: Secondary | ICD-10-CM

## 2021-12-02 DIAGNOSIS — E781 Pure hyperglyceridemia: Secondary | ICD-10-CM | POA: Diagnosis not present

## 2021-12-02 DIAGNOSIS — Z6841 Body Mass Index (BMI) 40.0 and over, adult: Secondary | ICD-10-CM | POA: Diagnosis not present

## 2021-12-02 DIAGNOSIS — E559 Vitamin D deficiency, unspecified: Secondary | ICD-10-CM

## 2021-12-02 DIAGNOSIS — E669 Obesity, unspecified: Secondary | ICD-10-CM | POA: Diagnosis not present

## 2021-12-02 DIAGNOSIS — R7303 Prediabetes: Secondary | ICD-10-CM | POA: Diagnosis not present

## 2021-12-02 DIAGNOSIS — E66813 Obesity, class 3: Secondary | ICD-10-CM

## 2021-12-03 ENCOUNTER — Other Ambulatory Visit (HOSPITAL_COMMUNITY): Payer: Medicare Other

## 2021-12-03 NOTE — H&P (Signed)
TOTAL KNEE REVISION ADMISSION H&P ? ?Patient is being admitted for right total knee arthroplasty revision.  ? ?Subjective: ? ?Chief Complaint: Right knee pain, s/p right TKA ? ?HPI: ? ?Annette Saunders comes to clinic for a second opinion of her right knee pain. She has a history of a bilateral total knees arthroplasty 11/10/2006 in [place]. She has pain with activity. She has a bad grinding sound with going up steps. She denies any swelling, locking or giving away. She has a popping occasionally but it is not painful. She uses biofreeze for her knee.  ? ?Annette Saunders is a pleasant 76 year old female who comes to the clinic for evaluation of right knee pain. She is status post bilateral total knee arthroplasties. The patient indicates her right knee has been causing significant pain for approximately 1 to 2 years now. She notes her pain has been worsening over the past 1 to 2 years. ? ? The patient indicates her knee grinds when she ascends stairs. She states she is unsure if she has any swelling or tightness in her knee. She denies any feelings of instability. ? ? The patient indicates her pain is not as severe as it was prior to her surgery. She notes her pain is aggravated by prolonged weight-bearing. She states she is able to clean without difficulty. ? ? The patient indicates she has had 2 arthroscopies on her right knee in the past. ? ?Patient Active Problem List  ? Diagnosis Date Noted  ? Chronic RLQ pain 08/10/2021  ? Pain of muscle of abdomen 08/10/2021  ? Benign lipomatous neoplasm of skin, subcu of right leg   ? Lipoma 08/18/2020  ? Fatigue 06/09/2020  ? Vitamin D deficiency 06/09/2020  ? Epigastric pain   ? Non-intractable vomiting   ? SBO (small bowel obstruction) ([place])   ? Class 2 obesity due to excess calories with body mass index (BMI) of 37.0 to 37.9 in adult   ? Ileus ([place]) 10/31/2019  ? Shortness of breath 10/16/2019  ? Encounter to discuss test results 10/16/2019  ? Renal insufficiency 10/16/2019  ?  Healthcare maintenance 10/16/2019  ? Otitis media 10/16/2019  ? Breast pain 10/16/2019  ? Abdominal pain 09/17/2019  ? Essential hypertension, benign 06/06/2019  ? Malaise and fatigue 06/06/2019  ? Obesity (BMI 30.0-34.9) 06/06/2019  ? Hypothyroidism, adult 06/06/2019  ? Diarrhea 03/28/2019  ? Pain due to total right knee replacement ([place]) 05/21/2018  ? Dysphagia 12/14/2017  ? GERD (gastroesophageal reflux disease) 10/27/2016  ? ? ?Past Medical History:  ?Diagnosis Date  ? Anxiety   ? Back pain   ? Essential hypertension, benign 06/06/2019  ? Generalized osteoarthritis   ? GERD (gastroesophageal reflux disease)   ? Hyperlipidemia   ? Hypothyroidism, adult 06/06/2019  ? Insomnia   ? Joint pain   ? Lactose intolerance   ? Lower extremity edema   ? Malaise and fatigue 06/06/2019  ? Obesity (BMI 30.0-34.9) 06/06/2019  ? ? ?Past Surgical History:  ?Procedure Laterality Date  ? BALLOON DILATION N/A 12/08/2020  ? Procedure: BALLOON DILATION;  Surgeon: Eloise Harman, DO;  [facility];  Service: Endoscopy;  Laterality: N/A;  ? BIOPSY  12/08/2020  ? Procedure: BIOPSY;  Surgeon: Eloise Harman, DO;  [facility]: [facility];  Service: Endoscopy;;  gastric  ? COLONOSCOPY  10/2014  ? Dr. Cristine Polio, outside hospital. Colonoscopy performed with Propofol, no polyps, small to medium size internal hemorrhoids noted.   ? COLONOSCOPY WITH PROPOFOL N/A 12/08/2020  ?  fair prep, non-bleeding internal hemorrhoids, one 2 mm polyp in transverse colon (tubular adenoma)  ? ECTOPIC PREGNANCY SURGERY    ? ELBOW FRACTURE SURGERY    ? ESOPHAGOGASTRODUODENOSCOPY (EGD) WITH PROPOFOL N/A 12/08/2020  ? benign-appearing esophageal stenosis, s/p dilation, gastritis s/p biopsy, normal duodenum. Biopsy with mild hyperemia, negative H.pylori  ? HIATAL HERNIA REPAIR    ? JOINT REPLACEMENT    ? bilateral knee  ? LIPOMA EXCISION Right 08/26/2020  ? Procedure: MINOR EXCISION LIPOMA;THIGH;  Surgeon: Virl Cagey, MD;  Location: [facility];   Service: General;  Laterality: Right;  ? POLYPECTOMY  12/08/2020  ? Procedure: POLYPECTOMY;  Surgeon: Eloise Harman, DO;  Location: [facility];  Service: Endoscopy;;  ? TONSILLECTOMY    ? TUBAL LIGATION    ? ? ?Prior to Admission medications   ?Medication Sig Start Date End Date Taking? Authorizing Provider  ?atorvastatin (LIPITOR) 20 MG tablet Take 20 mg by mouth in the morning.   Yes [provider]  ?escitalopram (LEXAPRO) 10 MG tablet TAKE 1 TABLET BY MOUTH EVERY DAY ?Patient taking differently: Take 10 mg by mouth at bedtime. 03/11/21  Yes Doree Albee, MD  ?esomeprazole (NEXIUM) 40 MG capsule Take 1 capsule (40 mg total) by mouth 2 (two) times daily before a meal. 07/05/21  Yes Aliene Altes S, PA-C  ?famotidine (PEPCID) 20 MG tablet TAKE 1 TABLET(20 MG) BY MOUTH AT BEDTIME 07/28/21  Yes Annitta Needs, NP  ?Melatonin 10 MG TABS Take 20 mg by mouth at bedtime.   Yes [provider]  ?Multiple Vitamin (MULTIVITAMIN WITH MINERALS) TABS tablet Take 1 tablet by mouth daily.   Yes [provider]  ?olmesartan (BENICAR) 20 MG tablet Take 20 mg by mouth daily.   Yes [provider]  ?dicyclomine (BENTYL) 10 MG capsule TAKE 1 CAPSULE(10 MG) BY MOUTH TWICE DAILY AS NEEDED 10/18/21   Annitta Needs, NP  ?gabapentin (NEURONTIN) 300 MG capsule Take 300 mg by mouth at bedtime. PRN 10/11/21   [provider]  ?meloxicam (MOBIC) 15 MG tablet Take 15 mg by mouth daily.    [provider]  ? ? ?Allergies  ?Allergen Reactions  ? Dilantin [Phenytoin Sodium Extended] Other (See Comments)  ?  Hallucinations if large amounts  ? Doxycycline Hyclate   ?  Blurry vision   ? ? ?Social History  ? ?Socioeconomic History  ? Marital status: Widowed  ?  Spouse name: Not on file  ? Number of children: Not on file  ? Years of education: Not on file  ? Highest education level: Not on file  ?Occupational History  ? Occupation: Retired Theme park manager  ?Tobacco Use  ? Smoking status: Never   ? Smokeless tobacco: Never  ?Vaping Use  ? Vaping Use: Never used  ?Substance and Sexual Activity  ? Alcohol use: No  ?  Alcohol/week: 0.0 standard drinks  ? Drug use: No  ? Sexual activity: Not Currently  ?  Birth control/protection: Post-menopausal  ?Other Topics Concern  ? Not on file  ?Social History Narrative  ? Widow for 98 years-husband died of MI.Lives alone.  ? ?Social Determinants of Health  ? ?Financial Resource Strain: Low Risk   ? Difficulty of Paying Living Expenses: Not hard at all  ?Food Insecurity: No Food Insecurity  ? Worried About Charity fundraiser in the Last Year: Never true  ? Ran Out of Food in the Last Year: Never true  ?Transportation Needs: No Transportation Needs  ?  Lack of Transportation (Medical): No  ? Lack of Transportation (Non-Medical): No  ?Physical Activity: Inactive  ? Days of Exercise per Week: 0 days  ? Minutes of Exercise per Session: 0 min  ?Stress: No Stress Concern Present  ? Feeling of Stress : Not at all  ?Social Connections: Moderately Isolated  ? Frequency of Communication with Friends and Family: More than three times a week  ? Frequency of Social Gatherings with Friends and Family: Three times a week  ? Attends Religious Services: 1 to 4 times per year  ? Active Member of Clubs or Organizations: No  ? Attends Archivist Meetings: Never  ? Marital Status: Widowed  ?Intimate Partner Violence: Not At Risk  ? Fear of Current or Ex-Partner: No  ? Emotionally Abused: No  ? Physically Abused: No  ? Sexually Abused: No  ? ? ?Tobacco Use: Low Risk   ? Smoking Tobacco Use: Never  ? Smokeless Tobacco Use: Never  ? Passive Exposure: Not on file  ? ?Social History  ? ?Substance and Sexual Activity  ?Alcohol Use No  ? Alcohol/week: 0.0 standard drinks  ? ? ?Family History  ?Problem Relation Age of Onset  ? Colon cancer Neg Hx   ? ? ?ROS: ?Constitutional: no fever, no chills, no night sweats, no significant weight loss ?Cardiovascular: no chest pain, no  palpitations ?Respiratory: no cough, no shortness of breath, No COPD ?Gastrointestinal: no vomiting, no nausea ?Musculoskeletal: no swelling in Joints, Joint Pain ?Neurologic: no numbness, no tingling, no difficulty wi

## 2021-12-06 ENCOUNTER — Other Ambulatory Visit: Payer: Self-pay

## 2021-12-06 ENCOUNTER — Encounter (HOSPITAL_COMMUNITY): Payer: Self-pay

## 2021-12-06 ENCOUNTER — Encounter (HOSPITAL_COMMUNITY)
Admission: RE | Admit: 2021-12-06 | Discharge: 2021-12-06 | Disposition: A | Discharge status: Home / Self Care | Payer: Medicare Other | Source: Ambulatory Visit | Attending: Orthopedic Surgery | Admitting: Orthopedic Surgery

## 2021-12-06 DIAGNOSIS — T8484XA Pain due to internal orthopedic prosthetic devices, implants and grafts, initial encounter: Secondary | ICD-10-CM | POA: Insufficient documentation

## 2021-12-06 DIAGNOSIS — Y838 Other surgical procedures as the cause of abnormal reaction of the patient, or of later complication, without mention of misadventure at the time of the procedure: Secondary | ICD-10-CM | POA: Insufficient documentation

## 2021-12-06 DIAGNOSIS — Z01818 Encounter for other preprocedural examination: Secondary | ICD-10-CM

## 2021-12-06 DIAGNOSIS — Z01812 Encounter for preprocedural laboratory examination: Secondary | ICD-10-CM | POA: Diagnosis not present

## 2021-12-06 DIAGNOSIS — T8484XS Pain due to internal orthopedic prosthetic devices, implants and grafts, sequela: Secondary | ICD-10-CM

## 2021-12-06 LAB — COMPREHENSIVE METABOLIC PANEL
ALT: 20 U/L (ref 0–44)
AST: 23 U/L (ref 15–41)
Albumin: 4 g/dL (ref 3.5–5.0)
Alkaline Phosphatase: 74 U/L (ref 38–126)
Anion gap: 7 (ref 5–15)
BUN: 36 mg/dL — ABNORMAL HIGH (ref 8–23)
CO2: 21 mmol/L — ABNORMAL LOW (ref 22–32)
Calcium: 9.4 mg/dL (ref 8.9–10.3)
Chloride: 111 mmol/L (ref 98–111)
Creatinine, Ser: 1.43 mg/dL — ABNORMAL HIGH (ref 0.44–1.00)
GFR, Estimated: 38 mL/min — ABNORMAL LOW (ref >60)
Glucose, Bld: 116 mg/dL — ABNORMAL HIGH (ref 70–99)
Potassium: 4.1 mmol/L (ref 3.5–5.1)
Sodium: 139 mmol/L (ref 135–145)
Total Bilirubin: 0.3 mg/dL (ref 0.3–1.2)
Total Protein: 7.3 g/dL (ref 6.5–8.1)

## 2021-12-06 LAB — CBC
HCT: 35.9 % — ABNORMAL LOW (ref 36.0–46.0)
Hemoglobin: 11.7 g/dL — ABNORMAL LOW (ref 12.0–15.0)
MCH: 29.3 pg (ref 26.0–34.0)
MCHC: 32.6 g/dL (ref 30.0–36.0)
MCV: 89.8 fL (ref 80.0–100.0)
Platelets: 264 10*3/uL (ref 150–400)
RBC: 4 MIL/uL (ref 3.87–5.11)
RDW: 14 % (ref 11.5–15.5)
WBC: 6.6 10*3/uL (ref 4.0–10.5)
nRBC: 0 % (ref 0.0–0.2)

## 2021-12-06 LAB — SURGICAL PCR SCREEN
MRSA, PCR: NEGATIVE
Staphylococcus aureus: NEGATIVE

## 2021-12-06 NOTE — Progress Notes (Signed)
? ? ? ?Chief Complaint:  ? ?OBESITY ?Annette Saunders is here to discuss her progress with her obesity treatment plan along with follow-up of her obesity related diagnoses. Katriona is on the Category 1 Plan with lunch options and states she is following her eating plan approximately 100% of the time. Jaymie states she is not currently exercising. ? ?Today's visit was #: 2 ?Starting weight: 220 lbs ?Starting date: 11/17/2021 ?Today's weight: 217 lbs ?Today's date: 12/02/2021 ?Total lbs lost to date: 3 ?Total lbs lost since last in-office visit: 3 ? ?Interim History: Annette Saunders is here today for her first follow-up office visit since starting the program with [place].  All blood work/ lab tests that were recently ordered by myself or an outside provider were reviewed with patient today per their request.   Extended time was spent counseling her on all new disease processes that were discovered or preexisting ones that are affected by BMI.  she understands that many of these abnormalities will need to monitored regularly along with the current treatment plan of prudent dietary changes, in which we are making each and every office visit, to improve these health parameters. Annette Saunders's surgery is scheduled for the 19th for right total knee replacement. Annette Saunders is here for a follow up office visit.  We reviewed her meal plan and all questions were answered.  Patient's food recall appears to be accurate and consistent with what is on plan when she is following it.   When eating on plan, her hunger and cravings are well controlled.   ? ?Subjective:  ? ?1. Prediabetes ?New diagnosis. Talisa's A1c and fasting insulin are both elevated. She notes she "loves pasta as she is and [place]". I discussed labs with the patient today.  ? ?2. Essential hypertension ?Annette Saunders's PCP recently changed her dose of blood pressure medication. She is not sure which one, as she thinks they stopped HCTZ. Her creatinine level is elevated. I  discussed labs with the patient today.  ? ?3. Hypertriglyceridemia ?Annette Saunders has a new diagnosis of hypertriglyceridemia and has been trying to improve her cholesterol levels with intensive lifestyle modifications including a low saturated fat diet, exercise, and weight loss. She denies any chest pain, claudication, or myalgias. Her triglycerides are elevated, but great HDL, and normal LDL. I discussed labs with the patient today.  ? ?4. Vitamin D deficiency ?New diagnosis. Annette Saunders is currently not taking vitamin D supplement. She denies nausea, vomiting or muscle weakness. I discussed labs with the patient today.  ? ?Assessment/Plan:  ?No orders of the defined types were placed in this encounter. ? ? ?Medications Discontinued During This Encounter  ?Medication Reason  ? hydrochlorothiazide (MICROZIDE) 12.5 MG capsule Patient Preference  ?  ? ?No orders of the defined types were placed in this encounter. ?  ? ?1. Prediabetes ?I reiterated and again counseled patient on pathophysiology of the disease process of  Pre-DM.  Wing is not a candidate for metformin due to history of side effects. ?Stressed importance of dietary and lifestyle modifications to result in weight loss as first line txmnt ?In addition, we discussed the risks and benefits of various medication options which can help [place] in the management of this disease process as well as with weight loss.  Will consider starting one of these meds in future, or a dose adjustment in the future, as we will focus on prudent nutritional plan at this time.  ?Continue to decrease simple carbs; increase fiber and proteins -> follow meal plan  ?Handouts  provided at pt's request after education provided.  All concerns/questions addressed.   ?Anticipatory guidance given.   ?- We will recheck A1c and fasting insulin level in approximately 3 months from last check, or as deemed appropriate.  ? ?2. Essential hypertension ?Counseling was done today. Amariya is to decrease  salt, and home blood pressure monitoring recommended 2-3 days per week. She is ok to start walking, and continue medication management per PCP. She will follow her blood pressure goals per PCP.  ? ?3. Hypertriglyceridemia ?Cardiovascular risk and specific lipid/LDL goals reviewed.  We discussed several lifestyle modifications today and Tashyra will continue to work on diet, exercise and weight loss efforts. Orders and follow up as documented in patient record.  ? ?Counseling ?Intensive lifestyle modifications are the first line treatment for this issue. ?Dietary changes: Increase soluble fiber. Decrease simple carbohydrates. ?Exercise changes: Moderate to vigorous-intensity aerobic activity 150 minutes per week if tolerated. ?Lipid-lowering medications: see documented in medical record. ? ?4. Vitamin D deficiency ?After surgery, Belky is to start OTC Vitamin D3 5,000 IU daily. ? ?- I discussed the importance of vitamin D to the patient's health and well-being.  ?- I reviewed possible symptoms of low Vitamin D:  low energy, depressed mood, muscle aches, joint aches, osteoporosis etc. was reviewed with patient ?- low Vitamin D levels may be linked to an increased risk of cardiovascular events and even increased risk of cancers- such as colon and breast.  ?- ideal vitamin D levels reviewed with patient  ?- I recommend pt take a weekly prescription vit D - see script below   ?- Informed patient this may be a lifelong thing, and she was encouraged to continue to take the medicine until told otherwise.    ?- weight loss will likely improve availability of vitamin D, thus encouraged Annette Saunders to continue with meal plan and their weight loss efforts to further improve this condition.  Thus, we will need to monitor levels regularly (every 3-4 mo on average) to keep levels within normal limits and prevent over supplementation. ?- pt's questions and concerns regarding this condition addressed. ?  ?5. Obesity with current  BMI of 42.5 ?Annette Saunders is currently in the action stage of change. As such, her goal is to continue with weight loss efforts. She has agreed to the Category 1 Plan with lunch options.  ? ?Mindful eating handouts. Start 10-15 minute distraction activity. Post surgical eating strategies were discussed with the patient. ? ?Exercise goals: 10 minutes of walking the dog daily. ? ?Behavioral modification strategies: better snacking choices, emotional eating strategies, and avoiding temptations. ? ?Annette Saunders has agreed to follow-up with our clinic in 3 weeks via telehealth with [facility], FNP-C. She was informed of the importance of frequent follow-up visits to maximize her success with intensive lifestyle modifications for her multiple health conditions.  ? ?Objective:  ? ?Blood pressure (!) 147/85, pulse 90, temperature (!) 97.4 ?F (36.3 ?C), height 5' (1.524 m), weight 217 lb (98.4 kg), SpO2 99 %. ?Body mass index is 42.38 kg/m?. ? ?General: Cooperative, alert, well developed, in no acute distress. ?HEENT: Conjunctivae and lids unremarkable. ?Cardiovascular: Regular rhythm.  ?Lungs: Normal work of breathing. ?Neurologic: No focal deficits.  ? ?Lab Results  ?Component Value Date  ? CREATININE 1.43 (H) 12/06/2021  ? BUN 36 (H) 12/06/2021  ? NA 139 12/06/2021  ? K 4.1 12/06/2021  ? CL 111 12/06/2021  ? CO2 21 (L) 12/06/2021  ? ?Lab Results  ?Component Value Date  ? ALT  20 12/06/2021  ? AST 23 12/06/2021  ? ALKPHOS 74 12/06/2021  ? BILITOT 0.3 12/06/2021  ? ?Lab Results  ?Component Value Date  ? HGBA1C 6.1 (H) 11/17/2021  ? ?Lab Results  ?Component Value Date  ? INSULIN 18.2 11/17/2021  ? ?Lab Results  ?Component Value Date  ? TSH 6.080 (H) 11/17/2021  ? ?Lab Results  ?Component Value Date  ? CHOL 187 11/17/2021  ? HDL 71 11/17/2021  ? [place] 86 11/17/2021  ? TRIG 182 (H) 11/17/2021  ? CHOLHDL 2.6 11/17/2021  ? ?Lab Results  ?Component Value Date  ? VD25OH 35.7 11/17/2021  ? VD25OH 51 06/09/2020  ? VD25OH 71 10/16/2019   ? ?Lab Results  ?Component Value Date  ? WBC 6.6 12/06/2021  ? HGB 11.7 (L) 12/06/2021  ? HCT 35.9 (L) 12/06/2021  ? MCV 89.8 12/06/2021  ? PLT 264 12/06/2021  ? ?No results found for: IRON, TIBC, FERRITIN

## 2021-12-07 NOTE — Progress Notes (Signed)
PT called back to inform nurse that PCP has added some new meds to her regimen.  Phone connnection was not good at time of phone ccal.  Understood pt to sya pt had been added Quviviq 50 mg at nite for sleep and bupropion 150 mg 2 times daily and olmesartan was the same as listed on epic med list.  Will call pt on 12/08/2021 to verify once more what new meds are and add to epic med list and hope phone connection is better.  PT voiced understanding.  PT aware at time of phone call to only take nexium am of surgery.  PT voiced understanding.   ?

## 2021-12-08 NOTE — Progress Notes (Signed)
Spoke with pt on 12/08/21.  Reviewed new meds with pt andverified with pt due to poor phone connection on 12/07/21.  Meds verified.  Instructed pt to take nexium and bupropion am of surgeyr.  PT voiced understanding.  ?

## 2021-12-13 DIAGNOSIS — Z20822 Contact with and (suspected) exposure to covid-19: Secondary | ICD-10-CM | POA: Diagnosis not present

## 2021-12-15 ENCOUNTER — Encounter (HOSPITAL_COMMUNITY)
Admission: RE | Disposition: A | Discharge status: Home Health | Payer: Self-pay | Source: Home / Self Care | Attending: Orthopedic Surgery

## 2021-12-15 ENCOUNTER — Inpatient Hospital Stay (HOSPITAL_COMMUNITY)
Admission: RE | Admit: 2021-12-15 | Discharge: 2021-12-19 | DRG: 466 | Disposition: A | Discharge status: Home Health | Payer: Medicare Other | Attending: Orthopedic Surgery | Admitting: Orthopedic Surgery

## 2021-12-15 ENCOUNTER — Other Ambulatory Visit: Payer: Self-pay

## 2021-12-15 ENCOUNTER — Encounter (HOSPITAL_COMMUNITY): Payer: Self-pay | Admitting: Orthopedic Surgery

## 2021-12-15 ENCOUNTER — Inpatient Hospital Stay (HOSPITAL_COMMUNITY): Payer: Medicare Other | Admitting: Physician Assistant

## 2021-12-15 ENCOUNTER — Inpatient Hospital Stay (HOSPITAL_COMMUNITY): Payer: Medicare Other | Admitting: Anesthesiology

## 2021-12-15 DIAGNOSIS — Z20822 Contact with and (suspected) exposure to covid-19: Secondary | ICD-10-CM | POA: Diagnosis not present

## 2021-12-15 DIAGNOSIS — N289 Disorder of kidney and ureter, unspecified: Secondary | ICD-10-CM

## 2021-12-15 DIAGNOSIS — Z881 Allergy status to other antibiotic agents status: Secondary | ICD-10-CM

## 2021-12-15 DIAGNOSIS — N1831 Chronic kidney disease, stage 3a: Secondary | ICD-10-CM | POA: Diagnosis present

## 2021-12-15 DIAGNOSIS — Z6841 Body Mass Index (BMI) 40.0 and over, adult: Secondary | ICD-10-CM | POA: Diagnosis not present

## 2021-12-15 DIAGNOSIS — G928 Other toxic encephalopathy: Secondary | ICD-10-CM | POA: Diagnosis not present

## 2021-12-15 DIAGNOSIS — E559 Vitamin D deficiency, unspecified: Secondary | ICD-10-CM | POA: Diagnosis present

## 2021-12-15 DIAGNOSIS — T40605A Adverse effect of unspecified narcotics, initial encounter: Secondary | ICD-10-CM | POA: Diagnosis not present

## 2021-12-15 DIAGNOSIS — Z791 Long term (current) use of non-steroidal anti-inflammatories (NSAID): Secondary | ICD-10-CM

## 2021-12-15 DIAGNOSIS — D62 Acute posthemorrhagic anemia: Secondary | ICD-10-CM | POA: Diagnosis not present

## 2021-12-15 DIAGNOSIS — Z96659 Presence of unspecified artificial knee joint: Secondary | ICD-10-CM

## 2021-12-15 DIAGNOSIS — T84012A Broken internal right knee prosthesis, initial encounter: Secondary | ICD-10-CM | POA: Diagnosis not present

## 2021-12-15 DIAGNOSIS — I959 Hypotension, unspecified: Secondary | ICD-10-CM | POA: Diagnosis not present

## 2021-12-15 DIAGNOSIS — Z96651 Presence of right artificial knee joint: Secondary | ICD-10-CM

## 2021-12-15 DIAGNOSIS — Z86010 Personal history of colonic polyps: Secondary | ICD-10-CM | POA: Diagnosis not present

## 2021-12-15 DIAGNOSIS — T84032A Mechanical loosening of internal right knee prosthetic joint, initial encounter: Secondary | ICD-10-CM | POA: Diagnosis present

## 2021-12-15 DIAGNOSIS — T84092A Other mechanical complication of internal right knee prosthesis, initial encounter: Secondary | ICD-10-CM | POA: Diagnosis not present

## 2021-12-15 DIAGNOSIS — T84018A Broken internal joint prosthesis, other site, initial encounter: Secondary | ICD-10-CM | POA: Diagnosis not present

## 2021-12-15 DIAGNOSIS — G4700 Insomnia, unspecified: Secondary | ICD-10-CM | POA: Diagnosis present

## 2021-12-15 DIAGNOSIS — F419 Anxiety disorder, unspecified: Secondary | ICD-10-CM | POA: Diagnosis present

## 2021-12-15 DIAGNOSIS — I10 Essential (primary) hypertension: Secondary | ICD-10-CM | POA: Diagnosis present

## 2021-12-15 DIAGNOSIS — T8484XS Pain due to internal orthopedic prosthetic devices, implants and grafts, sequela: Principal | ICD-10-CM

## 2021-12-15 DIAGNOSIS — Z8719 Personal history of other diseases of the digestive system: Secondary | ICD-10-CM | POA: Diagnosis not present

## 2021-12-15 DIAGNOSIS — E871 Hypo-osmolality and hyponatremia: Secondary | ICD-10-CM | POA: Diagnosis present

## 2021-12-15 DIAGNOSIS — G8929 Other chronic pain: Secondary | ICD-10-CM | POA: Diagnosis present

## 2021-12-15 DIAGNOSIS — I129 Hypertensive chronic kidney disease with stage 1 through stage 4 chronic kidney disease, or unspecified chronic kidney disease: Secondary | ICD-10-CM | POA: Diagnosis present

## 2021-12-15 DIAGNOSIS — R278 Other lack of coordination: Secondary | ICD-10-CM | POA: Diagnosis not present

## 2021-12-15 DIAGNOSIS — Z96652 Presence of left artificial knee joint: Secondary | ICD-10-CM | POA: Diagnosis present

## 2021-12-15 DIAGNOSIS — K219 Gastro-esophageal reflux disease without esophagitis: Secondary | ICD-10-CM | POA: Diagnosis present

## 2021-12-15 DIAGNOSIS — Z79899 Other long term (current) drug therapy: Secondary | ICD-10-CM

## 2021-12-15 DIAGNOSIS — E039 Hypothyroidism, unspecified: Secondary | ICD-10-CM | POA: Diagnosis present

## 2021-12-15 DIAGNOSIS — D649 Anemia, unspecified: Secondary | ICD-10-CM | POA: Diagnosis present

## 2021-12-15 DIAGNOSIS — K828 Other specified diseases of gallbladder: Secondary | ICD-10-CM | POA: Diagnosis not present

## 2021-12-15 DIAGNOSIS — E875 Hyperkalemia: Secondary | ICD-10-CM | POA: Diagnosis present

## 2021-12-15 DIAGNOSIS — T84022A Instability of internal right knee prosthesis, initial encounter: Secondary | ICD-10-CM | POA: Diagnosis not present

## 2021-12-15 DIAGNOSIS — Y792 Prosthetic and other implants, materials and accessory orthopedic devices associated with adverse incidents: Secondary | ICD-10-CM | POA: Diagnosis present

## 2021-12-15 DIAGNOSIS — G8918 Other acute postprocedural pain: Secondary | ICD-10-CM | POA: Diagnosis not present

## 2021-12-15 DIAGNOSIS — E785 Hyperlipidemia, unspecified: Secondary | ICD-10-CM | POA: Diagnosis present

## 2021-12-15 DIAGNOSIS — R079 Chest pain, unspecified: Secondary | ICD-10-CM | POA: Diagnosis not present

## 2021-12-15 DIAGNOSIS — M549 Dorsalgia, unspecified: Secondary | ICD-10-CM | POA: Diagnosis present

## 2021-12-15 DIAGNOSIS — E739 Lactose intolerance, unspecified: Secondary | ICD-10-CM | POA: Diagnosis present

## 2021-12-15 DIAGNOSIS — N179 Acute kidney failure, unspecified: Secondary | ICD-10-CM | POA: Diagnosis present

## 2021-12-15 DIAGNOSIS — I5189 Other ill-defined heart diseases: Secondary | ICD-10-CM

## 2021-12-15 DIAGNOSIS — Z01818 Encounter for other preprocedural examination: Secondary | ICD-10-CM

## 2021-12-15 DIAGNOSIS — Z888 Allergy status to other drugs, medicaments and biological substances status: Secondary | ICD-10-CM

## 2021-12-15 DIAGNOSIS — M25561 Pain in right knee: Secondary | ICD-10-CM | POA: Diagnosis not present

## 2021-12-15 DIAGNOSIS — M159 Polyosteoarthritis, unspecified: Secondary | ICD-10-CM | POA: Diagnosis present

## 2021-12-15 HISTORY — PX: TOTAL KNEE REVISION: SHX996

## 2021-12-15 LAB — TYPE AND SCREEN
ABO/RH(D): AB POS
Antibody Screen: NEGATIVE

## 2021-12-15 SURGERY — TOTAL KNEE REVISION
Anesthesia: Spinal | Site: Knee | Laterality: Right

## 2021-12-15 MED ORDER — ORAL CARE MOUTH RINSE
15.0000 mL | Freq: Once | OROMUCOSAL | Status: AC
Start: 2021-12-15 — End: 2021-12-15

## 2021-12-15 MED ORDER — CEFAZOLIN SODIUM-DEXTROSE 2-4 GM/100ML-% IV SOLN
2.0000 g | Freq: Four times a day (QID) | INTRAVENOUS | Status: AC
  Administered 2021-12-15 – 2021-12-16 (×2): 2 g via INTRAVENOUS
  Filled 2021-12-15 (×2): qty 100

## 2021-12-15 MED ORDER — BUPIVACAINE IN DEXTROSE 0.75-8.25 % IT SOLN
INTRATHECAL | Status: DC | PRN
  Administered 2021-12-15: 1.8 mL via INTRATHECAL

## 2021-12-15 MED ORDER — FENTANYL CITRATE PF 50 MCG/ML IJ SOSY
PREFILLED_SYRINGE | INTRAMUSCULAR | Status: AC
  Administered 2021-12-15: 50 ug via INTRAVENOUS
  Filled 2021-12-15: qty 1

## 2021-12-15 MED ORDER — CEFAZOLIN SODIUM-DEXTROSE 2-4 GM/100ML-% IV SOLN
2.0000 g | INTRAVENOUS | Status: AC
Start: 2021-12-15 — End: 2021-12-15
  Administered 2021-12-15: 2 g via INTRAVENOUS
  Filled 2021-12-15: qty 100

## 2021-12-15 MED ORDER — ONDANSETRON HCL 4 MG/2ML IJ SOLN
INTRAMUSCULAR | Status: AC
  Filled 2021-12-15: qty 2

## 2021-12-15 MED ORDER — TRANEXAMIC ACID-NACL 1000-0.7 MG/100ML-% IV SOLN
1000.0000 mg | INTRAVENOUS | Status: DC
  Filled 2021-12-15: qty 100

## 2021-12-15 MED ORDER — TRANEXAMIC ACID-NACL 1000-0.7 MG/100ML-% IV SOLN: 1000.0000 mg | INTRAVENOUS | Status: DC

## 2021-12-15 MED ORDER — MIDAZOLAM HCL 2 MG/2ML IJ SOLN
1.0000 mg | INTRAMUSCULAR | Status: DC
  Administered 2021-12-15: 2 mg via INTRAVENOUS
  Filled 2021-12-15: qty 2

## 2021-12-15 MED ORDER — RIVAROXABAN 10 MG PO TABS
10.0000 mg | ORAL_TABLET | Freq: Every day | ORAL | Status: DC
  Administered 2021-12-16 – 2021-12-17 (×2): 10 mg via ORAL
  Filled 2021-12-15 (×2): qty 1

## 2021-12-15 MED ORDER — LACTATED RINGERS IV SOLN
INTRAVENOUS | Status: DC
Start: 2021-12-15 — End: 2021-12-15

## 2021-12-15 MED ORDER — PROPOFOL 10 MG/ML IV BOLUS
INTRAVENOUS | Status: AC
  Filled 2021-12-15: qty 20

## 2021-12-15 MED ORDER — SODIUM CHLORIDE (PF) 0.9 % IJ SOLN
INTRAMUSCULAR | Status: AC
  Filled 2021-12-15: qty 10

## 2021-12-15 MED ORDER — FENTANYL CITRATE PF 50 MCG/ML IJ SOSY
50.0000 ug | PREFILLED_SYRINGE | INTRAMUSCULAR | Status: DC
  Administered 2021-12-15 (×2): 50 ug via INTRAVENOUS
  Filled 2021-12-15: qty 2

## 2021-12-15 MED ORDER — MENTHOL 3 MG MT LOZG: 1.0000 | LOZENGE | OROMUCOSAL | Status: DC | PRN

## 2021-12-15 MED ORDER — PROPOFOL 500 MG/50ML IV EMUL
INTRAVENOUS | Status: DC | PRN
  Administered 2021-12-15: 50 ug/kg/min via INTRAVENOUS

## 2021-12-15 MED ORDER — BUPIVACAINE LIPOSOME 1.3 % IJ SUSP: 20.0000 mL | Freq: Once | INTRAMUSCULAR | Status: DC

## 2021-12-15 MED ORDER — METOCLOPRAMIDE HCL 5 MG/ML IJ SOLN: 5.0000 mg | Freq: Three times a day (TID) | INTRAMUSCULAR | Status: DC | PRN

## 2021-12-15 MED ORDER — BUPROPION HCL ER (SR) 150 MG PO TB12
150.0000 mg | ORAL_TABLET | Freq: Two times a day (BID) | ORAL | Status: DC
  Administered 2021-12-15 – 2021-12-16 (×3): 150 mg via ORAL
  Filled 2021-12-15 (×3): qty 1

## 2021-12-15 MED ORDER — PHENYLEPHRINE HCL-NACL 20-0.9 MG/250ML-% IV SOLN
INTRAVENOUS | Status: DC | PRN
  Administered 2021-12-15: 50 ug/min via INTRAVENOUS

## 2021-12-15 MED ORDER — METHOCARBAMOL 500 MG IVPB - SIMPLE MED
500.0000 mg | Freq: Four times a day (QID) | INTRAVENOUS | Status: DC | PRN
  Administered 2021-12-15: 500 mg via INTRAVENOUS
  Filled 2021-12-15: qty 50

## 2021-12-15 MED ORDER — ATORVASTATIN CALCIUM 10 MG PO TABS
20.0000 mg | ORAL_TABLET | Freq: Every day | ORAL | Status: DC
  Administered 2021-12-16 – 2021-12-19 (×4): 20 mg via ORAL
  Filled 2021-12-15 (×2): qty 2
  Filled 2021-12-15: qty 1
  Filled 2021-12-15: qty 2

## 2021-12-15 MED ORDER — SODIUM CHLORIDE (PF) 0.9 % IJ SOLN
INTRAMUSCULAR | Status: DC | PRN
  Administered 2021-12-15: 60 mL

## 2021-12-15 MED ORDER — POLYETHYLENE GLYCOL 3350 17 G PO PACK
17.0000 g | PACK | Freq: Every day | ORAL | Status: DC | PRN
  Administered 2021-12-17: 17 g via ORAL
  Filled 2021-12-15: qty 1

## 2021-12-15 MED ORDER — DICYCLOMINE HCL 10 MG PO CAPS
10.0000 mg | ORAL_CAPSULE | Freq: Two times a day (BID) | ORAL | Status: DC
  Administered 2021-12-16 – 2021-12-19 (×7): 10 mg via ORAL
  Filled 2021-12-15 (×7): qty 1

## 2021-12-15 MED ORDER — DEXAMETHASONE SODIUM PHOSPHATE 10 MG/ML IJ SOLN
INTRAMUSCULAR | Status: AC
  Filled 2021-12-15: qty 1

## 2021-12-15 MED ORDER — BISACODYL 10 MG RE SUPP: 10.0000 mg | Freq: Every day | RECTAL | Status: DC | PRN

## 2021-12-15 MED ORDER — BUPIVACAINE-EPINEPHRINE (PF) 0.5% -1:200000 IJ SOLN: INTRAMUSCULAR | Status: DC | PRN

## 2021-12-15 MED ORDER — BUPIVACAINE LIPOSOME 1.3 % IJ SUSP
INTRAMUSCULAR | Status: AC
  Filled 2021-12-15: qty 20

## 2021-12-15 MED ORDER — BUPIVACAINE LIPOSOME 1.3 % IJ SUSP
INTRAMUSCULAR | Status: DC | PRN
  Administered 2021-12-15: 20 mL

## 2021-12-15 MED ORDER — FLEET ENEMA 7-19 GM/118ML RE ENEM: 1.0000 | ENEMA | Freq: Once | RECTAL | Status: DC | PRN

## 2021-12-15 MED ORDER — PROPOFOL 500 MG/50ML IV EMUL
INTRAVENOUS | Status: AC
  Filled 2021-12-15: qty 50

## 2021-12-15 MED ORDER — ACETAMINOPHEN 500 MG PO TABS
1000.0000 mg | ORAL_TABLET | Freq: Four times a day (QID) | ORAL | Status: AC
  Administered 2021-12-15 – 2021-12-16 (×3): 1000 mg via ORAL
  Filled 2021-12-15 (×4): qty 2

## 2021-12-15 MED ORDER — DEXAMETHASONE SODIUM PHOSPHATE 10 MG/ML IJ SOLN: 8.0000 mg | Freq: Once | INTRAMUSCULAR | Status: DC

## 2021-12-15 MED ORDER — PROPOFOL 10 MG/ML IV BOLUS
INTRAVENOUS | Status: DC | PRN
  Administered 2021-12-15: 30 mg via INTRAVENOUS
  Administered 2021-12-15: 20 mg via INTRAVENOUS

## 2021-12-15 MED ORDER — POVIDONE-IODINE 10 % EX SWAB: 2.0000 "application " | Freq: Once | CUTANEOUS | Status: DC

## 2021-12-15 MED ORDER — DEXAMETHASONE SODIUM PHOSPHATE 10 MG/ML IJ SOLN
INTRAMUSCULAR | Status: DC | PRN
  Administered 2021-12-15: 10 mg via INTRAVENOUS

## 2021-12-15 MED ORDER — OXYCODONE HCL 5 MG PO TABS
10.0000 mg | ORAL_TABLET | ORAL | Status: DC | PRN
  Administered 2021-12-15 – 2021-12-16 (×4): 10 mg via ORAL
  Filled 2021-12-15 (×4): qty 2

## 2021-12-15 MED ORDER — BUPIVACAINE-EPINEPHRINE (PF) 0.5% -1:200000 IJ SOLN
INTRAMUSCULAR | Status: DC | PRN
  Administered 2021-12-15: 20 mL via PERINEURAL

## 2021-12-15 MED ORDER — POVIDONE-IODINE 10 % EX SWAB
2.0000 "application " | Freq: Once | CUTANEOUS | Status: AC
  Administered 2021-12-15: 2 via TOPICAL

## 2021-12-15 MED ORDER — IRBESARTAN 150 MG PO TABS
150.0000 mg | ORAL_TABLET | Freq: Every day | ORAL | Status: DC
  Administered 2021-12-16: 150 mg via ORAL
  Filled 2021-12-15: qty 1

## 2021-12-15 MED ORDER — GABAPENTIN 300 MG PO CAPS
300.0000 mg | ORAL_CAPSULE | Freq: Three times a day (TID) | ORAL | Status: DC
  Administered 2021-12-15 – 2021-12-16 (×3): 300 mg via ORAL
  Filled 2021-12-15 (×3): qty 1

## 2021-12-15 MED ORDER — METHOCARBAMOL 500 MG PO TABS
500.0000 mg | ORAL_TABLET | Freq: Four times a day (QID) | ORAL | Status: DC | PRN
  Administered 2021-12-16: 500 mg via ORAL
  Filled 2021-12-15: qty 1

## 2021-12-15 MED ORDER — FENTANYL CITRATE PF 50 MCG/ML IJ SOSY
PREFILLED_SYRINGE | INTRAMUSCULAR | Status: AC
  Filled 2021-12-15: qty 1

## 2021-12-15 MED ORDER — SODIUM CHLORIDE 0.9 % IV SOLN: INTRAVENOUS | Status: DC

## 2021-12-15 MED ORDER — PANTOPRAZOLE SODIUM 40 MG PO TBEC
80.0000 mg | DELAYED_RELEASE_TABLET | Freq: Two times a day (BID) | ORAL | Status: DC
Start: 2021-12-15 — End: 2021-12-19
  Administered 2021-12-15 – 2021-12-19 (×8): 80 mg via ORAL
  Filled 2021-12-15 (×8): qty 2

## 2021-12-15 MED ORDER — DEXAMETHASONE SODIUM PHOSPHATE 10 MG/ML IJ SOLN
10.0000 mg | Freq: Once | INTRAMUSCULAR | Status: AC
  Administered 2021-12-16: 10 mg via INTRAVENOUS
  Filled 2021-12-15: qty 1

## 2021-12-15 MED ORDER — LACTATED RINGERS IV SOLN: INTRAVENOUS | Status: DC

## 2021-12-15 MED ORDER — ESCITALOPRAM OXALATE 20 MG PO TABS
10.0000 mg | ORAL_TABLET | Freq: Every day | ORAL | Status: DC
  Administered 2021-12-16 – 2021-12-18 (×3): 10 mg via ORAL
  Filled 2021-12-15 (×3): qty 1

## 2021-12-15 MED ORDER — PHENOL 1.4 % MT LIQD: 1.0000 | OROMUCOSAL | Status: DC | PRN

## 2021-12-15 MED ORDER — DIPHENHYDRAMINE HCL 12.5 MG/5ML PO ELIX: 12.5000 mg | ORAL_SOLUTION | ORAL | Status: DC | PRN

## 2021-12-15 MED ORDER — MELATONIN 5 MG PO TABS
20.0000 mg | ORAL_TABLET | Freq: Every day | ORAL | Status: DC
  Administered 2021-12-15 – 2021-12-16 (×2): 20 mg via ORAL
  Filled 2021-12-15 (×2): qty 4

## 2021-12-15 MED ORDER — MORPHINE SULFATE (PF) 2 MG/ML IV SOLN
1.0000 mg | INTRAVENOUS | Status: DC | PRN
  Administered 2021-12-15 – 2021-12-16 (×2): 1 mg via INTRAVENOUS
  Filled 2021-12-15 (×2): qty 1

## 2021-12-15 MED ORDER — ACETAMINOPHEN 10 MG/ML IV SOLN
1000.0000 mg | Freq: Four times a day (QID) | INTRAVENOUS | Status: DC
  Administered 2021-12-15: 1000 mg via INTRAVENOUS
  Filled 2021-12-15: qty 100

## 2021-12-15 MED ORDER — PROPOFOL 10 MG/ML IV BOLUS
INTRAVENOUS | Status: AC
Start: 2021-12-15 — End: ?
  Filled 2021-12-15: qty 20

## 2021-12-15 MED ORDER — FAMOTIDINE 20 MG PO TABS
20.0000 mg | ORAL_TABLET | Freq: Every day | ORAL | Status: DC
  Administered 2021-12-16 – 2021-12-19 (×4): 20 mg via ORAL
  Filled 2021-12-15 (×4): qty 1

## 2021-12-15 MED ORDER — ONDANSETRON HCL 4 MG/2ML IJ SOLN
INTRAMUSCULAR | Status: DC | PRN
  Administered 2021-12-15: 4 mg via INTRAVENOUS

## 2021-12-15 MED ORDER — SODIUM CHLORIDE (PF) 0.9 % IJ SOLN
INTRAMUSCULAR | Status: AC
  Filled 2021-12-15: qty 50

## 2021-12-15 MED ORDER — DOCUSATE SODIUM 100 MG PO CAPS
100.0000 mg | ORAL_CAPSULE | Freq: Two times a day (BID) | ORAL | Status: DC
  Administered 2021-12-15 – 2021-12-19 (×8): 100 mg via ORAL
  Filled 2021-12-15 (×8): qty 1

## 2021-12-15 MED ORDER — ONDANSETRON HCL 4 MG PO TABS: 4.0000 mg | ORAL_TABLET | Freq: Four times a day (QID) | ORAL | Status: DC | PRN

## 2021-12-15 MED ORDER — METHOCARBAMOL 500 MG IVPB - SIMPLE MED
INTRAVENOUS | Status: AC
  Filled 2021-12-15: qty 50

## 2021-12-15 MED ORDER — ONDANSETRON HCL 4 MG/2ML IJ SOLN: 4.0000 mg | Freq: Four times a day (QID) | INTRAMUSCULAR | Status: DC | PRN

## 2021-12-15 MED ORDER — CHLORHEXIDINE GLUCONATE 0.12 % MT SOLN
15.0000 mL | Freq: Once | OROMUCOSAL | Status: AC
  Administered 2021-12-15: 15 mL via OROMUCOSAL

## 2021-12-15 MED ORDER — LIDOCAINE HCL (PF) 2 % IJ SOLN
INTRAMUSCULAR | Status: AC
  Filled 2021-12-15: qty 5

## 2021-12-15 MED ORDER — TRANEXAMIC ACID 1000 MG/10ML IV SOLN
2000.0000 mg | Freq: Once | INTRAVENOUS | Status: AC
  Administered 2021-12-15: 2000 mg via TOPICAL
  Filled 2021-12-15: qty 20

## 2021-12-15 MED ORDER — OXYCODONE HCL 5 MG PO TABS: 5.0000 mg | ORAL_TABLET | ORAL | Status: DC | PRN

## 2021-12-15 MED ORDER — METOCLOPRAMIDE HCL 5 MG PO TABS: 5.0000 mg | ORAL_TABLET | Freq: Three times a day (TID) | ORAL | Status: DC | PRN

## 2021-12-15 SURGICAL SUPPLY — 74 items
AUG TIB ATTUNE LM/RL SZ3/4X10 (Joint) ×2 IMPLANT
AUG TIB ATTUNE RM/LL SZ3/4X10 (Joint) ×2 IMPLANT
AUGMENT DISTAL FEM SZ4 4 KNEE (Miscellaneous) ×2 IMPLANT
AUGMENT TIB ATT LM/RL SZ3/4X10 (Joint) IMPLANT
AUGMENT TIB ATT RM/LL SZ3/4X10 (Joint) IMPLANT
BAG COUNTER SPONGE SURGICOUNT (BAG) ×1 IMPLANT
BAG DECANTER FOR FLEXI CONT (MISCELLANEOUS) ×2 IMPLANT
BAG ZIPLOCK 12X15 (MISCELLANEOUS) ×1 IMPLANT
BLADE SAG 18X100X1.27 (BLADE) ×2 IMPLANT
BLADE SAW SGTL 11.0X1.19X90.0M (BLADE) ×2 IMPLANT
BLADE SURG SZ10 CARB STEEL (BLADE) IMPLANT
BNDG ELASTIC 6X5.8 VLCR STR LF (GAUZE/BANDAGES/DRESSINGS) ×2 IMPLANT
BONE CEMENT GENTAMICIN (Cement) ×4 IMPLANT
BOWL SMART MIX CTS (DISPOSABLE) ×1 IMPLANT
CEMENT BONE GENTAMICIN 40 (Cement) ×3 IMPLANT
COMP FEM ATTUNE CRS CEM RT SZ4 (Femur) ×2 IMPLANT
COMPONENT FEM ATN CR CEM RTSZ4 (Femur) IMPLANT
COVER SURGICAL LIGHT HANDLE (MISCELLANEOUS) ×2 IMPLANT
CUFF TOURN SGL QUICK 34 (TOURNIQUET CUFF)
CUFF TRNQT CYL 34X4.125X (TOURNIQUET CUFF) ×1 IMPLANT
DRAPE INCISE IOBAN 66X45 STRL (DRAPES) ×1 IMPLANT
DRAPE U-SHAPE 47X51 STRL (DRAPES) ×3 IMPLANT
DRSG ADAPTIC 3X8 NADH LF (GAUZE/BANDAGES/DRESSINGS) ×1 IMPLANT
DRSG PAD ABDOMINAL 8X10 ST (GAUZE/BANDAGES/DRESSINGS) ×1 IMPLANT
DURAPREP 26ML APPLICATOR (WOUND CARE) ×2 IMPLANT
ELECT REM PT RETURN 15FT ADLT (MISCELLANEOUS) ×2 IMPLANT
EVACUATOR 1/8 PVC DRAIN (DRAIN) IMPLANT
GAUZE SPONGE 4X4 12PLY STRL (GAUZE/BANDAGES/DRESSINGS) ×1 IMPLANT
GLOVE BIO SURGEON STRL SZ 6.5 (GLOVE) ×4 IMPLANT
GLOVE BIO SURGEON STRL SZ7 (GLOVE) ×3 IMPLANT
GLOVE BIO SURGEON STRL SZ8 (GLOVE) ×4 IMPLANT
GLOVE BIOGEL PI IND STRL 7.0 (GLOVE) ×1 IMPLANT
GLOVE BIOGEL PI IND STRL 8 (GLOVE) ×1 IMPLANT
GLOVE BIOGEL PI IND STRL 8.5 (GLOVE) IMPLANT
GLOVE BIOGEL PI INDICATOR 7.0 (GLOVE) ×1
GLOVE BIOGEL PI INDICATOR 8 (GLOVE) ×1
GLOVE BIOGEL PI INDICATOR 8.5 (GLOVE)
GOWN SPEC L4 XLG W/TWL (GOWN DISPOSABLE) ×3 IMPLANT
GOWN STRL REUS W/ TWL LRG LVL3 (GOWN DISPOSABLE) ×2 IMPLANT
GOWN STRL REUS W/TWL LRG LVL3 (GOWN DISPOSABLE) ×2
HANDPIECE INTERPULSE COAX TIP (DISPOSABLE) ×1
HOLDER FOLEY CATH W/STRAP (MISCELLANEOUS) ×1 IMPLANT
IMMOBILIZER KNEE 20 (SOFTGOODS) ×2
IMMOBILIZER KNEE 20 THIGH 36 (SOFTGOODS) ×1 IMPLANT
INSERT TIB CMT ATTUNE RP SZ4 (Knees) ×1 IMPLANT
INSERT TIB CRS ATTUNE SZ4 18 (Insert) ×1 IMPLANT
KIT TURNOVER KIT A (KITS) ×1 IMPLANT
MANIFOLD NEPTUNE II (INSTRUMENTS) ×2 IMPLANT
NS IRRIG 1000ML POUR BTL (IV SOLUTION) ×2 IMPLANT
PACK TOTAL KNEE CUSTOM (KITS) ×2 IMPLANT
PADDING CAST COTTON 6X4 STRL (CAST SUPPLIES) ×3 IMPLANT
PATELLA MEDIAL ATTUN 35MM KNEE (Knees) ×1 IMPLANT
PROTECTOR NERVE ULNAR (MISCELLANEOUS) ×2 IMPLANT
SET HNDPC FAN SPRY TIP SCT (DISPOSABLE) ×1 IMPLANT
SLEEVE KNEE ATTUNE 29MM (Knees) ×1 IMPLANT
SPIKE FLUID TRANSFER (MISCELLANEOUS) ×2 IMPLANT
STEM STR ATTUNE PF 14X60 (Knees) ×1 IMPLANT
STEM STR ATTUNE PF 16X60 (Knees) ×1 IMPLANT
STRIP CLOSURE SKIN 1/2X4 (GAUZE/BANDAGES/DRESSINGS) ×1 IMPLANT
SUT MNCRL AB 4-0 PS2 18 (SUTURE) ×2 IMPLANT
SUT STRATAFIX 0 PDS 27 VIOLET (SUTURE) ×2
SUT VIC AB 2-0 CT1 27 (SUTURE) ×3
SUT VIC AB 2-0 CT1 TAPERPNT 27 (SUTURE) ×3 IMPLANT
SUTURE STRATFX 0 PDS 27 VIOLET (SUTURE) ×1 IMPLANT
SWAB COLLECTION DEVICE MRSA (MISCELLANEOUS) IMPLANT
SWAB CULTURE ESWAB REG 1ML (MISCELLANEOUS) IMPLANT
SYR 50ML LL SCALE MARK (SYRINGE) ×3 IMPLANT
TOWER CARTRIDGE SMART MIX (DISPOSABLE) ×1 IMPLANT
TRAY FOLEY MTR SLVR 14FR STAT (SET/KITS/TRAYS/PACK) ×1 IMPLANT
TRAY FOLEY MTR SLVR 16FR STAT (SET/KITS/TRAYS/PACK) ×1 IMPLANT
TUBE KAMVAC SUCTION (TUBING) IMPLANT
TUBE SUCTION HIGH CAP CLEAR NV (SUCTIONS) ×2 IMPLANT
WATER STERILE IRR 1000ML POUR (IV SOLUTION) ×3 IMPLANT
WRAP KNEE MAXI GEL POST OP (GAUZE/BANDAGES/DRESSINGS) ×1 IMPLANT

## 2021-12-15 NOTE — Anesthesia Postprocedure Evaluation (Signed)
Anesthesia Post Note ? ?Patient: Annette Saunders ? ?Procedure(s) Performed: TOTAL KNEE REVISION (Right: Knee) ? ?  ? ?Patient location during evaluation: PACU ?Anesthesia Type: Spinal ?Level of consciousness: awake ?Pain management: pain level controlled ?Respiratory status: spontaneous breathing ?Cardiovascular status: stable ?Postop Assessment: no apparent nausea or vomiting ?Anesthetic complications: no ? ? ?No notable events documented. ? ?Last Vitals:  ?Vitals:  ? 12/15/21 1615 12/15/21 1630  ?BP: 105/74 117/76  ?Pulse: 90 95  ?Resp: 17 18  ?Temp: 36.6 ?C   ?SpO2: 95% 93%  ?  ?Last Pain:  ?Vitals:  ? 12/15/21 1630  ?TempSrc:   ?PainSc: 0-No pain  ? ? ?  ?  ?  ?  ?  ?  ? ?Melenda Bielak ? ? ? ? ?

## 2021-12-15 NOTE — Anesthesia Procedure Notes (Signed)
Spinal ? ?Patient location during procedure: OR ?Start time: 12/15/2021 1:08 PM ?End time: 12/15/2021 1:13 PM ?Reason for block: surgical anesthesia ?Staffing ?Performed: resident/CRNA  ?Resident/CRNA: Sharlette Dense, CRNA ?Preanesthetic Checklist ?Completed: patient identified, IV checked, site marked, risks and benefits discussed, surgical consent, monitors and equipment checked, pre-op evaluation and timeout performed ?Spinal Block ?Patient position: sitting ?Prep: DuraPrep and site prepped and draped ?Patient monitoring: heart rate, cardiac monitor, continuous pulse ox and blood pressure ?Approach: midline ?Location: L3-4 ?Injection technique: single-shot ?Needle ?Needle type: Pencan  ?Needle gauge: 24 G ?Needle length: 9 cm ?Additional Notes ?Kit expiration date 02/26/2024 and lot # [id number] ?Clear free flow CSF, negative heme, negative paresthesia ?Tolerated well and returned to supine position ? ? ? ?

## 2021-12-15 NOTE — Brief Op Note (Signed)
12/15/2021 ? ?3:04 PM ? ?PATIENT:  Annette Saunders  76 y.o. female ? ?PRE-OPERATIVE DIAGNOSIS:  failed right total knee arthroplasty ? ?POST-OPERATIVE DIAGNOSIS:  failed right total knee arthroplasty ? ?PROCEDURE:  Procedure(s): ?TOTAL KNEE REVISION (Right) ? ?SURGEON:  Surgeon(s) and Role: ?   Gaynelle Arabian, MD - Primary ? ?PHYSICIAN ASSISTANT:  ? ?ASSISTANTS: Jaynie Bream, PA-C  ? ?ANESTHESIA:   spinal and adductor canal block ? ?EBL:  200 mL  ? ?BLOOD ADMINISTERED:none ? ?DRAINS: none  ? ?LOCAL MEDICATIONS USED:  OTHER Exparel ? ?COUNTS:  YES ? ?TOURNIQUET:   ?Total Tourniquet Time Documented: ?Thigh (Right) - 46 minutes ?Thigh (Right) - 23 minutes ?Total: Thigh (Right) - 69 minutes ? ? ?DICTATION: .Other Dictation: Dictation Number [id number] ? ?PLAN OF CARE: Admit to inpatient  ? ?PATIENT DISPOSITION:  PACU - hemodynamically stable. ?  ? ? ?

## 2021-12-15 NOTE — Discharge Instructions (Addendum)
? ?Annette Arabian, MD ?Total Joint Specialist ?[facility] ?[address] ?[place], [place] [address] ?[phone] ? ?POSTOPERATIVE DIRECTIONS ? ?Knee Rehabilitation, Guidelines Following Surgery  ?Results after knee surgery are often greatly improved when you follow the exercise, range of motion and muscle strengthening exercises prescribed by your doctor. Safety measures are also important to protect the knee from further injury. If any of these exercises cause you to have increased pain or swelling in your knee joint, decrease the amount until you are comfortable again and slowly increase them. If you have problems or questions, call your caregiver or physical therapist for advice.  ? ?BLOOD CLOT PREVENTION ?Take a 10 mg Xarelto once a day for three weeks following surgery. Then take an 81 mg Aspirin once a day for three weeks. Then discontinue Aspirin. ?You may resume your vitamins/supplements once you have discontinued the Xarelto. ?Do not take any NSAIDs (Advil, Aleve, Ibuprofen, Meloxicam, etc.) until you have discontinued the Xarelto.  ? ?HOME CARE INSTRUCTIONS  ?Remove items at home which could result in a fall. This includes throw rugs or furniture in walking pathways.  ?ICE to the affected knee as much as tolerated. Icing helps control swelling. If the swelling is well controlled you will be more comfortable and rehab easier. Continue to use ice on the knee for pain and swelling from surgery. You may notice swelling that will progress down to the foot and ankle. This is normal after surgery. Elevate the leg when you are not up walking on it.    ?Continue to use the breathing machine which will help keep your temperature down. It is common for your temperature to cycle up and down following surgery, especially at night when you are not up moving around and exerting yourself. The breathing machine keeps your lungs expanded and your temperature down. ?Do not place pillow under  the operative knee, focus on keeping the knee straight while resting ? ?DIET ?You may resume your previous home diet once you are discharged from the hospital. ? ?DRESSING / WOUND CARE / SHOWERING ?Keep your bulky bandage on for 2 days. On the third post-operative day you may remove the Ace bandage and gauze. There is a waterproof adhesive bandage on your skin which will stay in place until your first follow-up appointment. Once you remove this you will not need to place another bandage ?You may begin showering 3 days following surgery, but do not submerge the incision under water. ? ?ACTIVITY ?For the first 5 days, the key is rest and control of pain and swelling ?Do your home exercises twice a day starting on post-operative day 3. On the days you go to physical therapy, just do the home exercises once that day. ?You should rest, ice and elevate the leg for 50 minutes out of every hour. Get up and walk/stretch for 10 minutes per hour. After 5 days you can increase your activity slowly as tolerated. ?Walk with your walker as instructed. Use the walker until you are comfortable transitioning to a cane. Walk with the cane in the opposite hand of the operative leg. You may discontinue the cane once you are comfortable and walking steadily. ?Avoid periods of inactivity such as sitting longer than an hour when not asleep. This helps prevent blood clots.  ?You may discontinue the knee immobilizer once you are able to perform a straight leg raise while lying down. ?You may resume a sexual relationship in one month or when given the OK by  your doctor.  ?You may return to work once you are cleared by your doctor.  ?Do not drive a car for 6 weeks or until released by your surgeon.  ?Do not drive while taking narcotics. ? ?TED HOSE STOCKINGS ?Wear the elastic stockings on both legs for three weeks following surgery during the day. You may remove them at night for sleeping. ? ?WEIGHT BEARING ?Weight bearing as tolerated with  assist device (walker, cane, etc) as directed, use it as long as suggested by your surgeon or therapist, typically at least 4-6 weeks. ? ?POSTOPERATIVE CONSTIPATION PROTOCOL ?Constipation - defined medically as fewer than three stools per week and severe constipation as less than one stool per week. ? ?One of the most common issues patients have following surgery is constipation.  Even if you have a regular bowel pattern at home, your normal regimen is likely to be disrupted due to multiple reasons following surgery.  Combination of anesthesia, postoperative narcotics, change in appetite and fluid intake all can affect your bowels.  In order to avoid complications following surgery, here are some recommendations in order to help you during your recovery period. ? ?Colace (docusate) - Pick up an over-the-counter form of Colace or another stool softener and take twice a day as long as you are requiring postoperative pain medications.  Take with a full glass of water daily.  If you experience loose stools or diarrhea, hold the colace until you stool forms back up. If your symptoms do not get better within 1 week or if they get worse, check with your doctor. ?Dulcolax (bisacodyl) - Pick up over-the-counter and take as directed by the product packaging as needed to assist with the movement of your bowels.  Take with a full glass of water.  Use this product as needed if not relieved by Colace only.  ?MiraLax (polyethylene glycol) - Pick up over-the-counter to have on hand. MiraLax is a solution that will increase the amount of water in your bowels to assist with bowel movements.  Take as directed and can mix with a glass of water, juice, soda, coffee, or tea. Take if you go more than two days without a movement. Do not use MiraLax more than once per day. Call your doctor if you are still constipated or irregular after using this medication for 7 days in a row. ? ?If you continue to have problems with postoperative  constipation, please contact the office for further assistance and recommendations.  If you experience "the worst abdominal pain ever" or develop nausea or vomiting, please contact the office immediatly for further recommendations for treatment. ? ?ITCHING ?If you experience itching with your medications, try taking only a single pain pill, or even half a pain pill at a time.  You can also use Benadryl over the counter for itching or also to help with sleep.  ? ?MEDICATIONS ?See your medication summary on the ?After Visit Summary? that the nursing staff will review with you prior to discharge.  You may have some home medications which will be placed on hold until you complete the course of blood thinner medication.  It is important for you to complete the blood thinner medication as prescribed by your surgeon.  Continue your approved medications as instructed at time of discharge. ? ?PRECAUTIONS ?If you experience chest pain or shortness of breath - call 911 immediately for transfer to the hospital emergency department.  ?If you develop a fever greater that 101 F, purulent drainage from wound, increased redness  or drainage from wound, foul odor from the wound/dressing, or calf pain - CONTACT YOUR SURGEON.   ?                                                ?FOLLOW-UP APPOINTMENTS ?Make sure you keep all of your appointments after your operation with your surgeon and caregivers. You should call the office at the above phone number and make an appointment for approximately two weeks after the date of your surgery or on the date instructed by your surgeon outlined in the "After Visit Summary". ? ?RANGE OF MOTION AND STRENGTHENING EXERCISES  ?Rehabilitation of the knee is important following a knee injury or an operation. After just a few days of immobilization, the muscles of the thigh which control the knee become weakened and shrink (atrophy). Knee exercises are designed to build up the tone and strength of the thigh  muscles and to improve knee motion. Often times heat used for twenty to thirty minutes before working out will loosen up your tissues and help with improving the range of motion but do not use heat for the first

## 2021-12-15 NOTE — Interval H&P Note (Signed)
History and Physical Interval Note: ? ?12/15/2021 ?11:40 AM ? ?Annette Saunders  has presented today for surgery, with the diagnosis of failed right total knee arthroplasty.  The various methods of treatment have been discussed with the patient and family. After consideration of risks, benefits and other options for treatment, the patient has consented to  Procedure(s): ?TOTAL KNEE REVISION (Right) as a surgical intervention.  The patient's history has been reviewed, patient examined, no change in status, stable for surgery.  I have reviewed the patient's chart and labs.  Questions were answered to the patient's satisfaction.   ? ? ?Annette Saunders ? ? ?

## 2021-12-15 NOTE — Anesthesia Procedure Notes (Addendum)
Anesthesia Regional Block: Adductor canal block  ? ?Pre-Anesthetic Checklist: , timeout performed,  Correct Patient, Correct Site, Correct Laterality,  Correct Procedure, Correct Position, site marked,  Risks and benefits discussed,  Surgical consent,  Pre-op evaluation,  At surgeon's request and post-op pain management ? ?Laterality: Right ? ?Prep: chloraprep     ?  ?Needles:  ? ?Needle Type: Echogenic Stimulator Needle   ? ? ? ? ? ? ? ?Additional Needles: ? ? ?Narrative:  ?Start time: 12/15/2021 11:50 AM ?End time: 12/15/2021 12:05 PM ?Injection made incrementally with aspirations every 5 mL. ? ?Performed by: Personally  ?Anesthesiologist: Belinda Block, MD ? ? ? ? ?

## 2021-12-15 NOTE — Evaluation (Signed)
Physical Therapy Evaluation ?Patient Details ?Name: Annette Saunders ?MRN: [id number] ?DOB: [birth year] ?Today's Date: 12/15/2021 ? ?History of Present Illness ? Pt is a 76yo female presenting s/p R-TKR on 12/15/21. PMH: OA, HTN, GERD, HLD, b/l knee replacement 2008.  ?Clinical Impression ? Annette Saunders is a 76 y.o. female POD 0 s/p R-total knee revision. Patient reports independence with mobility at baseline. Patient is now limited by functional impairments (see PT problem list below) and requires min assist for bed mobility and transfers. Patient was able to ambulate 3 feet EOB with RW and min guard assist. Patient instructed in exercise to facilitate ROM and circulation to manage edema. Patient will benefit from continued skilled PT interventions to address impairments and progress towards PLOF. Acute PT will follow to progress mobility and stair training in preparation for safe discharge home. ?   ?   ? ?Recommendations for follow up therapy are one component of a multi-disciplinary discharge planning process, led by the attending physician.  Recommendations may be updated based on patient status, additional functional criteria and insurance authorization. ? ?Follow Up Recommendations Follow physician's recommendations for discharge plan and follow up therapies ? ?  ?Assistance Recommended at Discharge Intermittent Supervision/Assistance  ?Patient can return home with the following ? A little help with walking and/or transfers;A little help with bathing/dressing/bathroom;Assistance with cooking/housework;Assist for transportation;Help with stairs or ramp for entrance ? ?  ?Equipment Recommendations Rolling walker (2 wheels);BSC/3in1  ?Recommendations for Other Services ?    ?  ?Functional Status Assessment Patient has had a recent decline in their functional status and demonstrates the ability to make significant improvements in function in a reasonable and predictable amount of time.  ? ?  ?Precautions /  Restrictions Precautions ?Precautions: Fall ?Required Braces or Orthoses: Knee Immobilizer - Right ?Knee Immobilizer - Right: On when out of bed or walking ?Restrictions ?Weight Bearing Restrictions: Yes ?RLE Weight Bearing: Weight bearing as tolerated  ? ?  ? ?Mobility ? Bed Mobility ?Overal bed mobility: Needs Assistance ?Bed Mobility: Supine to Sit, Sit to Supine ?  ?  ?Supine to sit: Min assist, HOB elevated ?Sit to supine: Min assist ?  ?General bed mobility comments: Pt min assist for bringing RLE off and back on bed, min guard otherwise. ?  ? ?Transfers ?Overall transfer level: Needs assistance ?Equipment used: Rolling walker (2 wheels) ?Transfers: Sit to/from Stand ?Sit to Stand: Min assist ?  ?  ?  ?  ?  ?General transfer comment: Pt min assist for steadying RW and light lift assist to come to standing with VCs for sequencing, hand placement, and encouragement. ?  ? ?Ambulation/Gait ?Ambulation/Gait assistance: Min guard ?Gait Distance (Feet): 3 Feet ?Assistive device: Rolling walker (2 wheels) ?Gait Pattern/deviations: WFL(Within Functional Limits) ?Gait velocity: decreased ?  ?  ?General Gait Details: Pt sidestepped EOB to change positioning when returned to supine. Pt in knee immobilizer. Pt reporting increase in pain and some lightheadedness and dizziness. RN notified and further mobility deferred. ? ?Stairs ?  ?  ?  ?  ?  ? ?Wheelchair Mobility ?  ? ?Modified Rankin (Stroke Patients Only) ?  ? ?  ? ?Balance Overall balance assessment: Needs assistance ?Sitting-balance support: Feet supported, No upper extremity supported ?Sitting balance-Leahy Scale: Fair ?  ?  ?Standing balance support: Bilateral upper extremity supported, During functional activity, Reliant on assistive device for balance ?Standing balance-Leahy Scale: Poor ?Standing balance comment: In standing, pt able to weightshift to experience WBAT status while in [place]. ?  ?  ?  ?  ?  ?  ?  ?  ?  ?  ?  ?   ? ? ? ?  Pertinent Vitals/Pain Pain  Assessment ?Pain Assessment: 0-10 ?Pain Score: 5  ?Pain Location: R knee ?Pain Descriptors / Indicators: Cramping, Discomfort, Operative site guarding ?Pain Intervention(s): Limited activity within patient's tolerance, Monitored during session, Repositioned, Patient requesting pain meds-RN notified  ? ? ?Home Living Family/patient expects to be discharged to:: Private residence ?Living Arrangements: Alone ?Available Help at Discharge: Neighbor;Available PRN/intermittently ?Type of Home: House ?Home Access: Stairs to enter ?Entrance Stairs-Rails: Can reach both;Left;Right ?Entrance Stairs-Number of Steps: 5 ?  ?Home Layout: One level ?Home Equipment: None ?Additional Comments: Pt requesting a BSC and tub bench  ?  ?Prior Function Prior Level of Function : Independent/Modified Independent ?  ?  ?  ?  ?  ?  ?Mobility Comments: ind ?ADLs Comments: ind ?  ? ? ?Hand Dominance  ?   ? ?  ?Extremity/Trunk Assessment  ? Upper Extremity Assessment ?Upper Extremity Assessment: Overall WFL for tasks assessed ?  ? ?Lower Extremity Assessment ?Lower Extremity Assessment: RLE deficits/detail;LLE deficits/detail ?RLE Deficits / Details: MMT ank pf/df 4+/5, no extensor lag noted ?RLE Sensation: WNL ?LLE Deficits / Details: MMT ank pf/df 4+/5 ?LLE Sensation: WNL ?  ? ?Cervical / Trunk Assessment ?Cervical / Trunk Assessment: Normal  ?Communication  ? Communication: No difficulties  ?Cognition Arousal/Alertness: Awake/alert ?Behavior During Therapy: [facility] for tasks assessed/performed ?Overall Cognitive Status: Within Functional Limits for tasks assessed ?  ?  ?  ?  ?  ?  ?  ?  ?  ?  ?  ?  ?  ?  ?  ?  ?  ?  ?  ? ?  ?General Comments   ? ?  ?Exercises Total Joint Exercises ?Ankle Circles/Pumps: AROM, Both, 20 reps  ? ?Assessment/Plan  ?  ?PT Assessment Patient needs continued PT services  ?PT Problem List Decreased strength;Decreased range of motion;Decreased activity tolerance;Decreased balance;Decreased mobility;Decreased  coordination;Pain ? ?   ?  ?PT Treatment Interventions DME instruction;Gait training;Stair training;Functional mobility training;Therapeutic activities;Therapeutic exercise;Balance training;Neuromuscular re-education;Patient/family education   ? ?PT Goals (Current goals can be found in the Care Plan section)  ?Acute Rehab PT Goals ?Patient Stated Goal: To be able to walk without pain ?PT Goal Formulation: With patient ?Time For Goal Achievement: 12/22/21 ?Potential to Achieve Goals: Good ? ?  ?Frequency 7X/week ?  ? ? ?Co-evaluation   ?  ?  ?  ?  ? ? ?  ?AM-PAC PT "6 Clicks" Mobility  ?Outcome Measure Help needed turning from your back to your side while in a flat bed without using bedrails?: None ?Help needed moving from lying on your back to sitting on the side of a flat bed without using bedrails?: A Little ?Help needed moving to and from a bed to a chair (including a wheelchair)?: A Little ?Help needed standing up from a chair using your arms (e.g., wheelchair or bedside chair)?: A Little ?Help needed to walk in hospital room?: A Little ?Help needed climbing 3-5 steps with a railing? : A Little ?6 Click Score: 19 ? ?  ?End of Session Equipment Utilized During Treatment: Gait belt ?Activity Tolerance: Patient limited by pain ?Patient left: in bed;with call bell/phone within reach;with bed alarm set ?Nurse Communication: Mobility status ?PT Visit Diagnosis: Pain;Difficulty in walking, not elsewhere classified (R26.2) ?Pain - Right/Left: Right ?Pain - part of body: Knee ?  ? ?Time: 1324-4010 ?PT Time Calculation (min) (ACUTE ONLY): 29 min ? ? ?Charges:   PT Evaluation ?$PT Eval Low Complexity: 1 Low ?PT Treatments ?$Therapeutic Activity: 8-22 mins ?  ?   ? ?  Coolidge Breeze, PT, DPT ?[facility] ?Office: [phone] ?Pager: [phone] ? ?Coolidge Breeze ?12/15/2021, 7:03 PM ? ?

## 2021-12-15 NOTE — Transfer of Care (Signed)
Immediate Anesthesia Transfer of Care Note ? ?Patient: Annette Saunders ? ?Procedure(s) Performed: TOTAL KNEE REVISION (Right: Knee) ? ?Patient Location: PACU ? ?Anesthesia Type:Spinal ? ?Level of Consciousness: awake, alert  and oriented ? ?Airway & Oxygen Therapy: Patient Spontanous Breathing and Patient connected to face mask oxygen ? ?Post-op Assessment: Report given to RN and Post -op Vital signs reviewed and stable ? ?Post vital signs: Reviewed and stable ? ?Last Vitals:  ?Vitals Value Taken Time  ?BP 139/73 12/15/21 1533  ?Temp    ?Pulse 82 12/15/21 1535  ?Resp 15 12/15/21 1535  ?SpO2 99 % 12/15/21 1535  ?Vitals shown include unvalidated device data. ? ?Last Pain:  ?Vitals:  ? 12/15/21 1127  ?TempSrc: Oral  ?   ? ?  ? ?Complications: No notable events documented. ?

## 2021-12-15 NOTE — Progress Notes (Signed)
AssistedDr. Charlene Green with right, adductor canal, ultrasound guided block. Side rails up, monitors on throughout procedure. See vital signs in flow sheet. Tolerated Procedure well.  

## 2021-12-15 NOTE — Anesthesia Preprocedure Evaluation (Addendum)
Anesthesia Evaluation  ?Patient identified by MRN, date of birth, ID band ?Patient awake ? ? ? ?Reviewed: ?Allergy & Precautions, NPO status , Patient's Chart, lab work & pertinent test results ? ?Airway ?Mallampati: II ? ?TM Distance: >3 FB ? ? ? ? Dental ?  ?Pulmonary ?shortness of breath,  ?  ?breath sounds clear to auscultation ? ? ? ? ? ? Cardiovascular ?hypertension,  ?Rhythm:Regular Rate:Normal ? ? ?  ?Neuro/Psych ?  ? GI/Hepatic ?Neg liver ROS, GERD  ,  ?Endo/Other  ?Hypothyroidism  ? Renal/GU ?Renal disease  ? ?  ?Musculoskeletal ? ? Abdominal ?  ?Peds ? Hematology ?  ?Anesthesia Other Findings ? ? Reproductive/Obstetrics ? ?  ? ? ? ? ? ? ? ? ? ? ? ? ? ?  ?  ? ? ? ? ? ? ? ? ?Anesthesia Physical ?Anesthesia Plan ? ?ASA: 3 ? ?Anesthesia Plan: Spinal  ? ?Post-op Pain Management: Regional block*  ? ?Induction: Intravenous ? ?PONV Risk Score and Plan: Treatment may vary due to age or medical condition, Ondansetron, Dexamethasone and Midazolam ? ?Airway Management Planned: Simple Face Mask ? ?Additional Equipment:  ? ?Intra-op Plan:  ? ?Post-operative Plan:  ? ?Informed Consent: I have reviewed the patients History and Physical, chart, labs and discussed the procedure including the risks, benefits and alternatives for the proposed anesthesia with the patient or authorized representative who has indicated his/her understanding and acceptance.  ? ? ? ?Dental advisory given ? ?Plan Discussed with: CRNA and Anesthesiologist ? ?Anesthesia Plan Comments:   ? ? ? ? ? ?Anesthesia Quick Evaluation ? ?

## 2021-12-15 NOTE — Plan of Care (Signed)

## 2021-12-15 NOTE — Anesthesia Procedure Notes (Signed)
Date/Time: 12/15/2021 1:06 PM ?Performed by: Sharlette Dense, CRNA ?Oxygen Delivery Method: Simple face mask ? ? ? ? ?

## 2021-12-16 ENCOUNTER — Encounter (HOSPITAL_COMMUNITY): Payer: Self-pay | Admitting: Orthopedic Surgery

## 2021-12-16 ENCOUNTER — Inpatient Hospital Stay (HOSPITAL_COMMUNITY): Payer: Medicare Other

## 2021-12-16 DIAGNOSIS — E875 Hyperkalemia: Secondary | ICD-10-CM | POA: Diagnosis present

## 2021-12-16 DIAGNOSIS — E871 Hypo-osmolality and hyponatremia: Secondary | ICD-10-CM | POA: Diagnosis present

## 2021-12-16 DIAGNOSIS — E039 Hypothyroidism, unspecified: Secondary | ICD-10-CM | POA: Diagnosis not present

## 2021-12-16 DIAGNOSIS — I5189 Other ill-defined heart diseases: Secondary | ICD-10-CM

## 2021-12-16 DIAGNOSIS — N179 Acute kidney failure, unspecified: Secondary | ICD-10-CM | POA: Diagnosis present

## 2021-12-16 DIAGNOSIS — N1831 Chronic kidney disease, stage 3a: Secondary | ICD-10-CM | POA: Insufficient documentation

## 2021-12-16 DIAGNOSIS — D649 Anemia, unspecified: Secondary | ICD-10-CM | POA: Diagnosis present

## 2021-12-16 HISTORY — DX: Chronic kidney disease, stage 3a: N18.31

## 2021-12-16 HISTORY — DX: Other ill-defined heart diseases: I51.89

## 2021-12-16 LAB — BASIC METABOLIC PANEL
Anion gap: 6 (ref 5–15)
Anion gap: 9 (ref 5–15)
BUN: 33 mg/dL — ABNORMAL HIGH (ref 8–23)
BUN: 38 mg/dL — ABNORMAL HIGH (ref 8–23)
CO2: 23 mmol/L (ref 22–32)
CO2: 21 mmol/L — ABNORMAL LOW (ref 22–32)
Calcium: 8.6 mg/dL — ABNORMAL LOW (ref 8.9–10.3)
Calcium: 8.5 mg/dL — ABNORMAL LOW (ref 8.9–10.3)
Chloride: 105 mmol/L (ref 98–111)
Chloride: 103 mmol/L (ref 98–111)
Creatinine, Ser: 1.57 mg/dL — ABNORMAL HIGH (ref 0.44–1.00)
Creatinine, Ser: 2.12 mg/dL — ABNORMAL HIGH (ref 0.44–1.00)
GFR, Estimated: 34 mL/min — ABNORMAL LOW (ref >60)
GFR, Estimated: 24 mL/min — ABNORMAL LOW (ref >60)
Glucose, Bld: 155 mg/dL — ABNORMAL HIGH (ref 70–99)
Glucose, Bld: 133 mg/dL — ABNORMAL HIGH (ref 70–99)
Potassium: 5.4 mmol/L — ABNORMAL HIGH (ref 3.5–5.1)
Potassium: 5.2 mmol/L — ABNORMAL HIGH (ref 3.5–5.1)
Sodium: 134 mmol/L — ABNORMAL LOW (ref 135–145)
Sodium: 133 mmol/L — ABNORMAL LOW (ref 135–145)

## 2021-12-16 LAB — CBC
HCT: 32 % — ABNORMAL LOW (ref 36.0–46.0)
Hemoglobin: 10 g/dL — ABNORMAL LOW (ref 12.0–15.0)
MCH: 28.8 pg (ref 26.0–34.0)
MCHC: 31.3 g/dL (ref 30.0–36.0)
MCV: 92.2 fL (ref 80.0–100.0)
Platelets: 233 10*3/uL (ref 150–400)
RBC: 3.47 MIL/uL — ABNORMAL LOW (ref 3.87–5.11)
RDW: 13.8 % (ref 11.5–15.5)
WBC: 9.2 10*3/uL (ref 4.0–10.5)
nRBC: 0 % (ref 0.0–0.2)

## 2021-12-16 LAB — GLUCOSE, CAPILLARY
Glucose-Capillary: 117 mg/dL — ABNORMAL HIGH (ref 70–99)
Glucose-Capillary: 104 mg/dL — ABNORMAL HIGH (ref 70–99)

## 2021-12-16 LAB — TROPONIN I (HIGH SENSITIVITY): Troponin I (High Sensitivity): 3 ng/L (ref <18)

## 2021-12-16 MED ORDER — LACTATED RINGERS IV BOLUS: 1000.0000 mL | Freq: Once | INTRAVENOUS | Status: DC

## 2021-12-16 MED ORDER — SODIUM ZIRCONIUM CYCLOSILICATE 10 G PO PACK
10.0000 g | PACK | Freq: Once | ORAL | Status: AC
  Administered 2021-12-16: 10 g via ORAL
  Filled 2021-12-16: qty 1

## 2021-12-16 MED ORDER — MORPHINE SULFATE (PF) 2 MG/ML IV SOLN
0.5000 mg | INTRAVENOUS | Status: DC | PRN
  Administered 2021-12-16 – 2021-12-17 (×2): 0.5 mg via INTRAVENOUS
  Filled 2021-12-16 (×2): qty 1

## 2021-12-16 MED ORDER — NALOXONE HCL 0.4 MG/ML IJ SOLN
INTRAMUSCULAR | Status: AC
  Administered 2021-12-16: 0.4 mg via INTRAVENOUS
  Filled 2021-12-16: qty 1

## 2021-12-16 MED ORDER — HYDROMORPHONE HCL 1 MG/ML IJ SOLN
0.5000 mg | INTRAMUSCULAR | Status: DC | PRN
  Administered 2021-12-16: 1 mg via INTRAVENOUS
  Filled 2021-12-16: qty 1

## 2021-12-16 MED ORDER — ORAL CARE MOUTH RINSE
15.0000 mL | Freq: Two times a day (BID) | OROMUCOSAL | Status: DC
  Administered 2021-12-17 – 2021-12-19 (×5): 15 mL via OROMUCOSAL

## 2021-12-16 MED ORDER — SODIUM CHLORIDE 0.9 % IV SOLN
500.0000 mL | Freq: Once | INTRAVENOUS | Status: AC
  Administered 2021-12-16: 500 mL via INTRAVENOUS

## 2021-12-16 MED ORDER — NITROGLYCERIN 0.4 MG SL SUBL: 0.4000 mg | SUBLINGUAL_TABLET | SUBLINGUAL | Status: DC | PRN

## 2021-12-16 MED ORDER — NITROGLYCERIN 0.4 MG SL SUBL
SUBLINGUAL_TABLET | SUBLINGUAL | Status: AC
Start: 2021-12-16 — End: 2021-12-17
  Filled 2021-12-16: qty 1

## 2021-12-16 MED ORDER — CHLORHEXIDINE GLUCONATE CLOTH 2 % EX PADS
6.0000 | MEDICATED_PAD | Freq: Every day | CUTANEOUS | Status: DC
  Administered 2021-12-16 – 2021-12-17 (×2): 6 via TOPICAL

## 2021-12-16 MED ORDER — ALUM & MAG HYDROXIDE-SIMETH 200-200-20 MG/5ML PO SUSP
30.0000 mL | Freq: Four times a day (QID) | ORAL | Status: DC | PRN
  Administered 2021-12-16 (×2): 30 mL via ORAL
  Filled 2021-12-16 (×2): qty 30

## 2021-12-16 MED ORDER — NALOXONE HCL 0.4 MG/ML IJ SOLN: 0.4000 mg | INTRAMUSCULAR | Status: DC | PRN

## 2021-12-16 MED ORDER — LACTATED RINGERS IV BOLUS
1000.0000 mL | Freq: Once | INTRAVENOUS | Status: AC
  Administered 2021-12-16: 1000 mL via INTRAVENOUS

## 2021-12-16 MED ORDER — GABAPENTIN 300 MG PO CAPS
300.0000 mg | ORAL_CAPSULE | Freq: Two times a day (BID) | ORAL | Status: DC
  Administered 2021-12-16: 300 mg via ORAL
  Filled 2021-12-16: qty 1

## 2021-12-16 MED ORDER — ACETAMINOPHEN 500 MG PO TABS
1000.0000 mg | ORAL_TABLET | Freq: Once | ORAL | Status: AC
  Administered 2021-12-16: 1000 mg via ORAL
  Filled 2021-12-16: qty 2

## 2021-12-16 MED ORDER — SODIUM CHLORIDE 0.9 % IV BOLUS
250.0000 mL | Freq: Once | INTRAVENOUS | Status: AC
  Administered 2021-12-16: 250 mL via INTRAVENOUS

## 2021-12-16 MED ORDER — SODIUM CHLORIDE 0.9 % IV BOLUS
500.0000 mL | Freq: Once | INTRAVENOUS | Status: AC
  Administered 2021-12-16: 500 mL via INTRAVENOUS

## 2021-12-16 NOTE — Progress Notes (Signed)
[place] consulting physician addendum: ? ?The patient became very somnolent, hypotensive and hypopneic with her respiratory rate in the single digits as low as 5 to 6 breaths/min.  She was given Narcan 0.4 mg IVP with good temporary response.  When I was examining her, she woke up to verbal stimuli, answers simple questions, but returns to sleep very quickly.  Please see rapid response and nursing staff notes.  She will be transferred to the stepdown unit for closer monitoring. ? ?Tennis Must, MD. ?

## 2021-12-16 NOTE — TOC Initial Note (Signed)
Transition of Care (TOC) - Initial/Assessment Note  ? ?Patient Details  ?Name: Annette Saunders ?MRN: [id number] ?Date of Birth: 1946-02-21 ? ?Transition of Care (TOC) CM/SW Contact:    ?Sherie Don, LCSW ?Phone Number: ?12/16/2021, 12:35 PM ? ?Clinical Narrative: Patient is expected to discharge home after working with PT. CSW met with patient to discuss discharge plan. Patient's original PT plan after discharge was to go to [facility] in [place], but patient reported she doesn't have anyone to drive her there and is requesting HHPT. CSW explained that approval for HHPT will have to be through the orthopedist and PA. ? ?Patient will need a youth rolling walker and she is declining to private pay for the 3N1. [employer] delivered walker to patient's room. ? ?CSW notified ortho PA regarding patient's request for HHPT. Ortho team to follow up with patient tomorrow. TOC to follow. ? ?Expected Discharge Plan: OP Rehab ?Barriers to Discharge: No Barriers Identified ? ?Patient Goals and CMS Choice ?Patient states their goals for this hospitalization and ongoing recovery are:: Discharge home ?CMS Medicare.gov Compare Post Acute Care list provided to:: Patient ?Choice offered to / list presented to : Patient ? ?Expected Discharge Plan and Services ?Expected Discharge Plan: OP Rehab ?In-house Referral: Clinical Social Work ?Post Acute Care Choice: Durable Medical Equipment ?Living arrangements for the past 2 months: [place]           ?DME Arranged: [employer] youth ?DME Agency: [employer] ?Date DME Agency Contacted: 12/16/21 ?Representative spoke with at DME Agency: Wells Guiles ? ?Prior Living Arrangements/Services ?Living arrangements for the past 2 months: [place] ?Lives with:: Self ?Patient language and need for interpreter reviewed:: Yes ?Do you feel safe going back to the place where you live?: Yes      ?Need for Family Participation in Patient Care: Yes (Comment) ?Care giver support system in place?: Yes  (comment) ?Criminal Activity/Legal Involvement Pertinent to Current Situation/Hospitalization: No - Comment as needed ? ?Activities of Daily Living ?Home Assistive Devices/Equipment: Eyeglasses ?ADL Screening (condition at time of admission) ?Patient's cognitive ability adequate to safely complete daily activities?: Yes ?Is the patient deaf or have difficulty hearing?: No ?Does the patient have difficulty seeing, even when wearing glasses/contacts?: No ?Does the patient have difficulty concentrating, remembering, or making decisions?: No ?Patient able to express need for assistance with ADLs?: Yes ?Does the patient have difficulty dressing or bathing?: No ?Independently performs ADLs?: Yes (appropriate for developmental age) ?Does the patient have difficulty walking or climbing stairs?: No ?Weakness of Legs: None ?Weakness of Arms/Hands: None ? ?Emotional Assessment ?Appearance:: Appears stated age ?Attitude/Demeanor/Rapport: Engaged ?Affect (typically observed): Accepting ?Orientation: : Oriented to Self, Oriented to Place, Oriented to  Time, Oriented to Situation ?Alcohol / Substance Use: Not Applicable ?Psych Involvement: No (comment) ? ?Admission diagnosis:  Failed total knee arthroplasty ([place]) [T84.018A, [place] ?Patient Active Problem List  ? Diagnosis Date Noted  ? Failed total knee arthroplasty ([place]) 12/15/2021  ? Chronic RLQ pain 08/10/2021  ? Pain of muscle of abdomen 08/10/2021  ? Benign lipomatous neoplasm of skin, subcu of right leg   ? Lipoma 08/18/2020  ? Fatigue 06/09/2020  ? Vitamin D deficiency 06/09/2020  ? Epigastric pain   ? Non-intractable vomiting   ? SBO (small bowel obstruction) ([place])   ? Class 2 obesity due to excess calories with body mass index (BMI) of 37.0 to 37.9 in adult   ? Ileus ([place]) 10/31/2019  ? Shortness of breath 10/16/2019  ? Encounter to discuss test  results 10/16/2019  ? Renal insufficiency 10/16/2019  ? Healthcare maintenance 10/16/2019  ? Otitis media 10/16/2019  ? Breast  pain 10/16/2019  ? Abdominal pain 09/17/2019  ? Essential hypertension, benign 06/06/2019  ? Malaise and fatigue 06/06/2019  ? Obesity (BMI 30.0-34.9) 06/06/2019  ? Hypothyroidism, adult 06/06/2019  ? Diarrhea 03/28/2019  ? Pain due to total right knee replacement ([place]) 05/21/2018  ? Dysphagia 12/14/2017  ? GERD (gastroesophageal reflux disease) 10/27/2016  ? ?PCP:  Celene Squibb, MD ?Pharmacy:   ?[facility] ?[place] [address] ?Phone: [phone] Fax: [phone] ? ?Readmission Risk Interventions ?   ? View : No data to display.  ?  ?  ?  ? ?

## 2021-12-16 NOTE — Progress Notes (Signed)
?   12/16/21 1709  ?Vitals  ?Temp 98.3 ?F (36.8 ?C)  ?Temp Source Axillary  ?BP (!) 74/58  ?MAP (mmHg) (!) 62  ?Pulse Rate 77  ?ECG Heart Rate 79  ?Resp (!) 6  ?Level of Consciousness  ?Level of Consciousness Responds to Voice  ?MEWS COLOR  ?MEWS Score Color Red  ?Oxygen Therapy  ?SpO2 100 %  ?O2 Device Nasal Cannula  ?O2 Flow Rate (L/min) 3 L/min  ?MEWS Score  ?MEWS Temp 0  ?MEWS Systolic 2  ?MEWS Pulse 0  ?MEWS RR 2  ?MEWS LOC 1  ?MEWS Score 5  ?Provider Notification  ?Provider Name/Title Dr. Olevia Bowens  ?Date Provider Notified 12/16/21  ?Time Provider Notified 1710  ?Method of Notification Page  ?Notification Reason Change in status  ? ?Pt lethargic, having trouble staying awake when staff talking/asking questions. BP 70's/50's, HR 70's, RR 6 bpm. RRT RN called and at bedside, Dr. Olevia Bowens paged and made aware. Narcan given per emergency orders. Pt more alert and talking post Narcan.  ?

## 2021-12-16 NOTE — Progress Notes (Signed)
Physical Therapy Treatment ?Patient Details ?Name: Annette Saunders ?MRN: [id number] ?DOB: 1946-03-08 ?Today's Date: 12/16/2021 ? ? ?History of Present Illness Pt is a 76yo female presenting s/p R-TKR on 12/15/21. PMH: OA, HTN, GERD, HLD, b/l knee replacement 2008. ? ?  ?PT Comments  ? ? POD # 1 am session ?General Comments: AxO x 3 pleasant originally from [facility] ?Assisted OOB to amb to bathroom required increased time and effort.  General transfer comment: 50% VC's on proper hand placement and increased time to rise.  Also assisted with a toilet transfer.  Unsteady.  Sluggish. General Gait Details: limited distance of 11 feet to and from bathroom.  Slow/sluggish/unsteady. Called for + 2 assist for out of bathroom for safety due to pt's instability.  Positioned in recliner and performed a few TE's followed by ICE.   ?  ?Recommendations for follow up therapy are one component of a multi-disciplinary discharge planning process, led by the attending physician.  Recommendations may be updated based on patient status, additional functional criteria and insurance authorization. ? ?Follow Up Recommendations ? Follow physician's recommendations for discharge plan and follow up therapies ?  ?  ?Assistance Recommended at Discharge Intermittent Supervision/Assistance  ?Patient can return home with the following A little help with walking and/or transfers;A little help with bathing/dressing/bathroom;Assistance with cooking/housework;Assist for transportation;Help with stairs or ramp for entrance ?  ?Equipment Recommendations ? Rolling walker (2 wheels);BSC/3in1  ?  ?Recommendations for Other Services   ? ? ?  ?Precautions / Restrictions Precautions ?Precautions: Fall ?Precaution Comments: instructed no pillow under knee ?Restrictions ?Weight Bearing Restrictions: No ?RLE Weight Bearing: Weight bearing as tolerated  ?  ? ?Mobility ? Bed Mobility ?Overal bed mobility: Needs Assistance ?Bed Mobility: Supine to Sit ?  ?  ?Supine to  sit: Min assist, Mod assist ?  ?  ?General bed mobility comments: assisted OOB with increased time and 50% VC's on proper tech and support R LE ?  ? ?Transfers ?Overall transfer level: Needs assistance ?Equipment used: Rolling walker (2 wheels) ?Transfers: Sit to/from Stand ?Sit to Stand: Mod assist, Min assist ?  ?  ?  ?  ?  ?General transfer comment: 50% VC's on proper hand placement and increased time to rise.  Also assisted with a toilet transfer.  Unsteady.  Sluggish. ?  ? ?Ambulation/Gait ?Ambulation/Gait assistance: Min assist, Mod assist ?Gait Distance (Feet): 22 Feet (11 feet x 2) ?Assistive device: Rolling walker (2 wheels) ?Gait Pattern/deviations: Decreased step length - right, Decreased step length - left, Decreased stance time - right ?Gait velocity: decreased ?  ?  ?General Gait Details: limited distance of 11 feet z 2 to and from bathroom.  Slow/sluggish/unsteady. ? ? ?Stairs ?  ?  ?  ?  ?  ? ? ?Wheelchair Mobility ?  ? ?Modified Rankin (Stroke Patients Only) ?  ? ? ?  ?Balance   ?  ?  ?  ?  ?  ?  ?  ?  ?  ?  ?  ?  ?  ?  ?  ?  ?  ?  ?  ? ?  ?Cognition Arousal/Alertness: Awake/alert ?Behavior During Therapy: [facility] for tasks assessed/performed ?Overall Cognitive Status: Within Functional Limits for tasks assessed ?  ?  ?  ?  ?  ?  ?  ?  ?  ?  ?  ?  ?  ?  ?  ?  ?General Comments: AxO x 3 pleasant originally from [facility] ?  ?  ? ?  ?  Exercises   ? ?  ?General Comments   ?  ?  ? ?Pertinent Vitals/Pain Pain Assessment ?Pain Assessment: Faces ?Faces Pain Scale: Hurts little more ?Pain Location: R knee ?Pain Descriptors / Indicators: Cramping, Discomfort, Operative site guarding ?Pain Intervention(s): Monitored during session, Repositioned, Ice applied  ? ? ?Home Living   ?  ?  ?  ?  ?  ?  ?  ?  ?  ?   ?  ?Prior Function    ?  ?  ?   ? ?PT Goals (current goals can now be found in the care plan section) Progress towards PT goals: Progressing toward goals ? ?  ?Frequency ? ? ? 7X/week ? ? ? ?  ?PT Plan  Current plan remains appropriate  ? ? ?Co-evaluation   ?  ?  ?  ?  ? ?  ?AM-PAC PT "6 Clicks" Mobility   ?Outcome Measure ? Help needed turning from your back to your side while in a flat bed without using bedrails?: A Lot ?Help needed moving from lying on your back to sitting on the side of a flat bed without using bedrails?: A Lot ?Help needed moving to and from a bed to a chair (including a wheelchair)?: A Lot ?Help needed standing up from a chair using your arms (e.g., wheelchair or bedside chair)?: A Lot ?Help needed to walk in hospital room?: A Lot ?Help needed climbing 3-5 steps with a railing? : A Lot ?6 Click Score: 12 ? ?  ?End of Session Equipment Utilized During Treatment: Gait belt ?Activity Tolerance: Patient limited by pain ?Patient left: in chair;with call bell/phone within reach ?Nurse Communication: Mobility status ?PT Visit Diagnosis: Pain;Difficulty in walking, not elsewhere classified (R26.2) ?Pain - Right/Left: Right ?Pain - part of body: Knee ?  ? ? ?Time: 1000-1030 ?PT Time Calculation (min) (ACUTE ONLY): 30 min ? ?Charges:  $Gait Training: 8-22 mins ?$Therapeutic Activity: 8-22 mins          ?          ? ?{Romone Shaff  PTA ?Acute  Rehabilitation Services ?Pager      [phone] ?Office      [phone] ? ?

## 2021-12-16 NOTE — Progress Notes (Signed)
?   12/16/21 1709  ?Assess: MEWS Score  ?Temp 98.3 ?F (36.8 ?C)  ?BP (!) 74/58  ?Pulse Rate 77  ?ECG Heart Rate 79  ?Resp (!) 6  ?Level of Consciousness Responds to Voice  ?SpO2 100 %  ?O2 Device Nasal Cannula  ?O2 Flow Rate (L/min) 3 L/min  ?Assess: MEWS Score  ?MEWS Temp 0  ?MEWS Systolic 2  ?MEWS Pulse 0  ?MEWS RR 2  ?MEWS LOC 1  ?MEWS Score 5  ?MEWS Score Color Red  ?Assess: if the MEWS score is Yellow or Red  ?Were vital signs taken at a resting state? Yes  ?Focused Assessment Change from prior assessment (see assessment flowsheet)  ?Does the patient meet 2 or more of the SIRS criteria? No  ?MEWS guidelines implemented *See Row Information* Yes  ?Treat  ?MEWS Interventions Administered prn meds/treatments  ?Pain Scale Faces  ?Faces Pain Scale 6  ?Pain Type Acute pain  ?Pain Location Head  ?Pain Orientation Posterior  ?Pain Descriptors / Indicators Headache  ?Pain Frequency Constant  ?Pain Onset Sudden  ?Take Vital Signs  ?Increase Vital Sign Frequency  Red: Q 1hr X 4 then Q 4hr X 4, if remains red, continue Q 4hrs  ?Escalate  ?MEWS: Escalate Red: discuss with charge nurse/RN and provider, consider discussing with RRT  ?Notify: Charge Nurse/RN  ?Name of Charge Nurse/RN Notified Megan Bullins, Rn  ?Date Charge Nurse/RN Notified 12/16/21  ?Time Charge Nurse/RN Notified 1729  ?Notify: Provider  ?Provider Name/Title Dr. Olevia Bowens  ?Date Provider Notified 12/16/21  ?Time Provider Notified 1710  ?Notification Type Page  ?Notification Reason Change in status  ?Notify: Rapid Response  ?Name of Rapid Response RN Notified Zoe Osipenko  ?Date Rapid Response Notified 12/16/21  ?Time Rapid Response Notified 1705  ?Assess: SIRS CRITERIA  ?SIRS Temperature  0  ?SIRS Pulse 0  ?SIRS Respirations  0  ?SIRS WBC 0  ?SIRS Score Sum  0  ? ? ?

## 2021-12-16 NOTE — Plan of Care (Addendum)
Patient complaining off and on chest 9 of 10.  Location is mid chest (sternal).  Aching pain - not sharp.  Vitals stable.  PA notified by chat and informed of interventions and patient changes. STAT EKG being completed - protocol per chrg.  EKG NSR and looked normal to me, RN. Since pain coming and going.  Contacted ICU chrg and coming up to round on patient.  Sates she may just run troponin too. ? ?ICU chrg arrived.  500cc x2 NS bolus given.  Maalox, Trop ordered and stat CXR.  Communicated with Dr. Maureen Ralphs and Ilene Qua. ?PLaced on 3LNC to maintain low 90s.   ?Patient is lethargic and will open eyes breifly but returns to work in seconds.  Speech slightly slurred and was not (prior to episode.).   ?Equal grip and strength of bilat UE and LE.  No droop in face or smile.  Hosp placed on case and then placed orders to transfer to 4E 1403. ?Report called and given to Wess Botts, RN ? ?

## 2021-12-16 NOTE — Consult Note (Signed)
Initial Consultation Note ? ? ?PatientNoelly Saunders HGD:[id number] DOB: [birth year] PCP: Annette Squibb, MD ?DOA: 12/15/2021 ?DOS: the patient was seen and examined on 12/16/2021 ?Primary service: Annette Arabian, MD ? ?Referring physician: Gaynelle Arabian, MD and Annette Lesch, PA-C ?Reason for consult: Hypotension, hyperkalemia and AKI. ? ?Assessment and Plan: ?Principal Problem: ?  Failed total knee arthroplasty ([place]) ?Continue postop management per primary team. ? ?Active Problems: ?  AKI (acute kidney injury) ([place]) ?Superimposed on: ?  Stage 3a chronic kidney disease (CKD) ([place]) ?Hold angiotensin receptor blocker. ?LR 1 L now. ?Avoid hypotension. ?Avoid nephrotoxins. ?Continue IV fluids. ?Monitor intake and output. ?Follow renal function electrolytes. ? ?  Hyponatremia ?Minimal. ?Continue IV fluids. ?Follow-up sodium level. ? ?  Hyperkalemia ?Continue IV fluids. ?Lokelma 5 g p.o. ?Follow potassium level in the morning. ? ?  GERD (gastroesophageal reflux disease) ?Continue PPI and H2 blocker. ? ?  Essential hypertension, benign ?Hold antihypertensives. ?Monitor blood pressure closely. ? ?  Grade I diastolic dysfunction ?No signs of decompensation at this time. ? ?  Hypothyroidism, adult ?Not on levothyroxine. ?TSH level slightly elevated 4 weeks ago. ?TSH nondetectable on previous 2021 and 2022 measurements. ? ?  Normocytic anemia ?Monitor hematocrit and hemoglobin. ? ? ?TRH will continue to follow the patient. ? ?HPI: Annette Saunders is a 76 y.o. female with past medical history of anxiety, chronic back pain, essential hypertension, generalized osteoarthritis, GERD, hyperlipidemia, insomnia, hypothyroidism, joint pain, lactose intolerance, lower extremity edema, malaise, fatigue, class III obesity who we are seeing due to chest pain, hypotension and AKI with hyperkalemia after undergoing a knee revision this morning.  She denied any significant pain at the time of my examination but have been recently medicated.   She has a mild lightheadedness.  No fever, chills, headaches, sore throat or rhinorrhea.  She is not dyspneic at this time.  Denied nausea, emesis, diarrhea, constipation, melena or hematochezia.  Has not urinated much today, but no flank pain, dysuria, frequency or hematuria. ? ?Review of Systems: As mentioned in the history of present illness. All other systems reviewed and are negative. ?Past Medical History:  ?Diagnosis Date  ? Anxiety   ? Back pain   ? Essential hypertension, benign 06/06/2019  ? Generalized osteoarthritis   ? GERD (gastroesophageal reflux disease)   ? Hyperlipidemia   ? Insomnia   ? Joint pain   ? Lactose intolerance   ? Lower extremity edema   ? Malaise and fatigue 06/06/2019  ? Obesity (BMI 30.0-34.9) 06/06/2019  ? ?Past Surgical History:  ?Procedure Laterality Date  ? BALLOON DILATION N/A 12/08/2020  ? Procedure: BALLOON DILATION;  Surgeon: Annette Harman, DO;  [facility];  Service: Endoscopy;  Laterality: N/A;  ? BIOPSY  12/08/2020  ? Procedure: BIOPSY;  Surgeon: Annette Harman, DO;  Location: [facility];  Service: Endoscopy;;  gastric  ? COLONOSCOPY  10/2014  ? Dr. Cristine Saunders, outside hospital. Colonoscopy performed with Propofol, no polyps, small to medium size internal hemorrhoids noted.   ? COLONOSCOPY WITH PROPOFOL N/A 12/08/2020  ? fair prep, non-bleeding internal hemorrhoids, one 2 mm polyp in transverse colon (tubular adenoma)  ? ECTOPIC PREGNANCY SURGERY    ? ELBOW FRACTURE SURGERY    ? ESOPHAGOGASTRODUODENOSCOPY (EGD) WITH PROPOFOL N/A 12/08/2020  ? benign-appearing esophageal stenosis, Saunders/p dilation, gastritis Saunders/p biopsy, normal duodenum. Biopsy with mild hyperemia, negative H.pylori  ? HIATAL HERNIA REPAIR    ? JOINT REPLACEMENT    ? bilateral knee  ? LIPOMA EXCISION  Right 08/26/2020  ? Procedure: MINOR EXCISION LIPOMA;THIGH;  Surgeon: Virl Cagey, MD;  Location: [facility] ORS;  Service: General;  Laterality: Right;  ? POLYPECTOMY  12/08/2020  ? Procedure:  POLYPECTOMY;  Surgeon: Annette Harman, DO;  Location: [facility];  Service: Endoscopy;;  ? TONSILLECTOMY    ? TOTAL KNEE REVISION Right 12/15/2021  ? Procedure: TOTAL KNEE REVISION;  Surgeon: Annette Arabian, MD;  Location: [facility] OR[facility];  Service: Orthopedics;  Laterality: Right;  ? TUBAL LIGATION    ? ?Social History:  reports that she has never smoked. She has never used smokeless tobacco. She reports that she does not drink alcohol and does not use drugs. ? ?Allergies  ?Allergen Reactions  ? Dilantin [Phenytoin Sodium Extended] Other (See Comments)  ?  Hallucinations if large amounts  ? Doxycycline Hyclate   ?  Blurry vision   ? Lactose Intolerance ([place])   ? ? ?Family History  ?Problem Relation Age of Onset  ? Colon cancer Neg Hx   ? ? ?Prior to Admission medications   ?Medication Sig Start Date End Date Taking? Authorizing Provider  ?atorvastatin (LIPITOR) 20 MG tablet Take 20 mg by mouth in the morning.   Yes [provider]  ?buPROPion (ZYBAN) 150 MG 12 hr tablet Take 150 mg by mouth 2 (two) times daily. Pt started on 12/07/21   Yes [provider]  ?Daridorexant HCl (QUVIVIQ) 50 MG TABS Take by mouth. At nite for sleep pt started on 12/07/21   Yes [provider]  ?dicyclomine (BENTYL) 10 MG capsule TAKE 1 CAPSULE(10 MG) BY MOUTH TWICE DAILY AS NEEDED 10/18/21  Yes Annette Needs, NP  ?escitalopram (LEXAPRO) 10 MG tablet TAKE 1 TABLET BY MOUTH EVERY DAY ?Patient taking differently: Take 10 mg by mouth at bedtime. 03/11/21  Yes Annette Albee, MD  ?esomeprazole (NEXIUM) 40 MG capsule Take 1 capsule (40 mg total) by mouth 2 (two) times daily before a meal. 07/05/21  Yes Annette Altes S, PA-C  ?famotidine (PEPCID) 20 MG tablet TAKE 1 TABLET(20 MG) BY MOUTH AT BEDTIME 07/28/21  Yes Annette Needs, NP  ?gabapentin (NEURONTIN) 300 MG capsule Take 300 mg by mouth at bedtime. PRN 10/11/21  Yes [provider]  ?Melatonin 10 MG TABS Take 20 mg by mouth at bedtime.   Yes [provider]  ?meloxicam (MOBIC) 15 MG tablet Take 15 mg by mouth daily.   Yes [provider]  ?Multiple Vitamin (MULTIVITAMIN WITH MINERALS) TABS tablet Take 1 tablet by mouth daily.   Yes [provider]  ?olmesartan (BENICAR) 20 MG tablet Take 20 mg by mouth daily.   Yes [provider]  ? ? ?Physical Exam: ?Vitals:  ? 12/16/21 0117 12/16/21 0515 12/16/21 1002 12/16/21 1100  ?BP: (!) 95/50 111/74 115/63 122/70  ?Pulse: 79 75 75 80  ?Resp: '18 18 18 20  '$ ?Temp: 97.7 ?F (36.5 ?C) 97.8 ?F (36.6 ?C) 98.5 ?F (36.9 ?C) 98.6 ?F (37 ?C)  ?TempSrc: Oral Oral Oral Oral  ?SpO2: 93% 95% 98% 100%  ?Weight:      ?Height:      ? ?Physical Exam ?Vitals and nursing note reviewed.  ?Constitutional:   ?   Appearance: She is obese.  ?HENT:  ?   Head: Normocephalic.  ?   Mouth/Throat:  ?   Mouth: Mucous membranes are dry.  ?Eyes:  ?   General: No scleral icterus. ?   Pupils: Pupils are equal, round, and reactive to light.  ?  Neck:  ?   Vascular: No JVD.  ?Cardiovascular:  ?   Rate and Rhythm: Normal rate and regular rhythm.  ?   Heart sounds: S1 normal and S2 normal.  ?Pulmonary:  ?   Effort: Pulmonary effort is normal.  ?   Breath sounds: Normal breath sounds. No wheezing or rales.  ?Abdominal:  ?   General: Bowel sounds are normal. There is no distension.  ?   Palpations: Abdomen is soft.  ?   Tenderness: There is no abdominal tenderness.  ?Musculoskeletal:  ?   Cervical back: Neck supple.  ?   Right lower leg: No edema.  ?   Left lower leg: No edema.  ?   Comments: Right knee dressing and soft immobilization in place. ?  ?Skin: ?   General: Skin is warm and dry.  ?Neurological:  ?   General: No focal deficit present.  ?   Mental Status: She is alert and oriented to person, place, and time.  ?Psychiatric:     ?   Mood and Affect: Mood normal.     ?   Behavior: Behavior normal.  ? ?Data Reviewed:  ? ?There are no new results to review at this time.  ? ?Family Communication:  ?Primary team communication:   ?Thank you very much for involving [place] in the care of your patient. ? ?Author: ?Reubin Milan, MD ?12/16/2021 1:51 PM ? ?For on call review [email or web address].  ? ?This document was prepared using Dragon voice recogni

## 2021-12-16 NOTE — Progress Notes (Addendum)
? ?  Subjective: ?1 Day Post-Op Procedure(s) (LRB): ?TOTAL KNEE REVISION (Right) ?Patient seen in rounds by Dr. Wynelle Link. ?Patient is well. Foley cath removed this AM. Denies SOB or chest pain. ?Patient reports pain as moderate and had an increase in pain overnight. She felt the IV pain meds were not as effective. She was only able to ambulate 3' with PT due to pain. ?We will continue therapy today. ? ?Objective: ?Vital signs in last 24 hours: ?Temp:  [97.5 ?F (36.4 ?C)-98.3 ?F (36.8 ?C)] 97.8 ?F (36.6 ?C) (04/20 0515) ?Pulse Rate:  [75-95] 75 (04/20 0515) ?Resp:  [9-22] 18 (04/20 0515) ?BP: (95-184)/(49-91) 111/74 (04/20 0515) ?SpO2:  [91 %-100 %] 95 % (04/20 0515) ?Weight:  [99.8 kg] 99.8 kg (04/19 1734) ? ?Intake/Output from previous day: ? ?Intake/Output Summary (Last 24 hours) at 12/16/2021 5056 ?Last data filed at 12/16/2021 0600 ?Gross per 24 hour  ?Intake 4392.35 ml  ?Output 1750 ml  ?Net 2642.35 ml  ?  ? ?Intake/Output this shift: ?No intake/output data recorded. ? ?Labs: ?Recent Labs  ?  12/16/21 ?0301  ?HGB 10.0*  ? ?Recent Labs  ?  12/16/21 ?0301  ?WBC 9.2  ?RBC 3.47*  ?HCT 32.0*  ?PLT 233  ? ?Recent Labs  ?  12/16/21 ?0301  ?NA 134*  ?K 5.4*  ?CL 105  ?CO2 23  ?BUN 33*  ?CREATININE 1.57*  ?GLUCOSE 155*  ?CALCIUM 8.6*  ? ?No results for input(s): LABPT, INR in the last 72 hours. ? ?Exam: ?General - Patient is Alert and Oriented ?Extremity - Neurologically intact ?Neurovascular intact ?Sensation intact distally ?Dorsiflexion/Plantar flexion intact ?Dressing - dressing C/D/I ?Motor Function - intact, moving foot and toes well on exam. ? ?Past Medical History:  ?Diagnosis Date  ? Anxiety   ? Back pain   ? Essential hypertension, benign 06/06/2019  ? Generalized osteoarthritis   ? GERD (gastroesophageal reflux disease)   ? Hyperlipidemia   ? Insomnia   ? Joint pain   ? Lactose intolerance   ? Lower extremity edema   ? Malaise and fatigue 06/06/2019  ? Obesity (BMI 30.0-34.9) 06/06/2019  ? ? ?Assessment/Plan: ?1  Day Post-Op Procedure(s) (LRB): ?TOTAL KNEE REVISION (Right) ?Principal Problem: ?  Failed total knee arthroplasty ([place]) ? ?Estimated body mass index is 42.97 kg/m? as calculated from the following: ?  Height as of this encounter: 5' (1.524 m). ?  Weight as of this encounter: 99.8 kg. ?Advance diet ?Up with therapy ?D/C IV fluids ? ?Anticipated LOS equal to or greater than 2 midnights due to ?- Age 6 and older with one or more of the following: ? - Obesity ? - Expected need for hospital services (PT, OT, Nursing) required for safe  discharge ? ?DVT Prophylaxis - Xarelto ?Weight bearing as tolerated. ?Continue physical therapy. ? ?Patient with increase in creatinine. Will start IV fluids. Recheck BMP at 1300 to reassess. Continue with PT today. Upon discharge, patient will do outpatient physical therapy at [facility] in [place]. ? ?R. Jaynie Bream, PA-C ?Orthopedic Surgery ?[phone] ?12/16/2021, 7:12 AM ?

## 2021-12-16 NOTE — Op Note (Signed)
NAME: Annette Saunders, Annette Saunders ?MEDICAL RECORD NO: [id number] ?ACCOUNT NO: [id number] ?DATE OF BIRTH: [birth year] ?FACILITY: [facility] ?LOCATION: [facility] ?PHYSICIAN: Dione Plover. Aala Ransom, MD ? ?Operative Report  ? ?DATE OF PROCEDURE: 12/15/2021 ? ? ?PREOPERATIVE DIAGNOSIS:  Failed right total knee arthroplasty. ? ?POSTOPERATIVE DIAGNOSIS:  Failed right total knee arthroplasty. ? ?PROCEDURE:  Right total knee arthroplasty revision. ? ?SURGEON:  Dione Plover. Saidee Geremia, MD ? ?ASSISTANT:  Jaynie Bream, PA-C ? ?ANESTHESIA:  Adductor canal block and spinal. ? ?ESTIMATED BLOOD LOSS:  200.  ? ?DRAIN:  None. ? ?TOURNIQUET TIME:  Up 46 minutes at 300 mmHg, down 8 minutes, up additional 23 minutes at 300 mmHg. ? ?COMPLICATIONS:  None. ? ?CONDITION:  Stable to recovery. ? ?BRIEF CLINICAL NOTE:  The patient is a 76 year old female who has a painful loose right total knee arthroplasty with gross instability.  She has aseptic loosening.  She had infection workup, which was negative.  She presents now for right total knee  ?arthroplasty revision. ? ?DESCRIPTION OF PROCEDURE:  After successful administration of adductor canal block, then spinal, a tourniquet was placed on her right thigh and right lower extremity prepped and draped in the usual sterile fashion.  Extremity was wrapped in Esmarch and  ?tourniquet inflated to 300 mmHg.  Midline incision was made with a 10 blade through subcutaneous tissue to the extensor mechanism.  A fresh blade was used to make a medial parapatellar arthrotomy.  It was noted that there was a lot of lytic wear debris  ?present in the joint and some metal staining in the synovium.  Soft tissue over the proximal medial tibia subperiosteally elevated to the joint line with a knife and into the semimembranosus bursa with a Cobb elevator.  Soft tissue laterally was elevated ? with attention being paid to avoid the patellar tendon on the tibial tubercle.  Synovectomy was performed to remove all of the abnormal tissue.  There were  multiple fragments of polyethylene present throughout the synovium consistent with the  ?significant particle disease.  The patella was then everted and it was noted that this patella was never resurfaced.  There were still native bone, but essentially no cartilage left.  The tibia was then subluxed forward and circumferential retraction  ?placed around the tibia.  The tibial polyethylene was removed from the tibial tray.  The tray was in significant varus.  I used the saw and osteotomes to disrupt the interface between the tibial tray and bone.  It was a press-fit tray.  This was removed  ?with minimal bone loss.  We accessed the tibial canal and thoroughly irrigated it.  I reamed up to 14 mm for a 60 x 14 press-fit stem.  We then placed the extramedullary tibial alignment guide, referencing proximally at the medial aspect of tibial  ?tubercle and distally along the second metatarsal axis and tibial crest.  A block was pinned to remove 2 mm off the non-deficient side.  Tibial resection was made with an oscillating saw.  Her bone quality was very good in the tibia.  Size 4 Attune  ?revision tray was the most appropriate.  We did our proximal reaming and then I did the broaching for a 29 mm sleeve. ? ?We then prepared the femur.  Osteotome was used to disrupt the interface between the femoral component and bone.  The femoral component was removed with minimal bone loss.  We accessed the femoral canal, reamed up to 16 mm for a 16 x 100 stem, which was  ?press-fit.  The rod was left in for alignment rod, the distal femoral cutting block was placed to remove 2 mm off the distal femur.  We decided to use 4 mm augments to build the joint line back down.  The size 4 cutting block was then placed and rotation ? of the epicondylar axis confirmed by creating a rectangular flexion gap at 90 degrees.  The block for the size 4 was placed in, we did not get any bone anterior, minimal bone posterior and no bone on chamfers.   Intercondylar block was placed and that  ?cut was then made. ? ?The trials were then made.  It was felt that I needed to place augments on the tibia to build the joint line back to normal, so we proceeded a size 4 Attune revision tibia with 10 mm augments medial and lateral, 29 sleeve and a 14 x 60 press-fit stem  ?trial.  On the femoral side the trial is a size 4 posterior stabilized constrained trial with 4 mm augments medial and lateral and a 16 x 100 stem extension.  Trials were placed with excellent fit.  An 18 mm insert allowed for full extension with  ?excellent varus, valgus, and anterior, posterior balance throughout full range of motion.  We then everted the patella, measured the thickness to be 24 mm.  Freehand resection was taken to 14 mm.    35 template was placed, lug holes were drilled, trial patella  ?was placed and it tracked normally.  The tourniquet was then released for a total of 8 minutes while the components were assembled on the back table.  There was minimal bleeding and that stopped with electrocautery.  Once the components were assembled  ?and the leg was rewrapped in an Esmarch and the tourniquet was re-inflated to 300 mmHg.  We then mixed two batches of cement.  When the cement was ready for implantation, then, we placed the tibial component, which just cemented at the cut bone surface  ?proximally.  It had porous coated 29 sleeve and a 14 x 60 press-fit stem.  The tibial component was cemented into place and all extruded cement removed.  On femoral side, we cemented distally and had the 16 x 100 stem, which was press-fit.  This was  ?impacted with great fit and all extruded cement removed.  18 mm trial was placed.  The knee held in full extension.  All extruded cement removed.  The 35 patella was also cemented into place and that was held with a clamp.  When the cement was fully  ?hardened and then a permanent 18 mm constrained posterior stabilized rotating platform insert was placed  for the size 4 Attune femur.  This had great stability throughout full range of motion.  Wound was then copiously irrigated with saline solution.  20 ? mL of Exparel mixed with 60 mL of saline was injected into the extensor mechanism, periosteum of the femur and subcutaneous tissues.  Further irrigation was performed and the arthrotomy closed with running 0 Stratafix suture.  Tourniquet was released  ?for a second tourniquet time of 23 minutes.  The subcutaneous tissue was then closed with interrupted 2-0 Vicryl and subcuticular running 4-0 Monocryl.  Incisions cleaned and dried and Steri-Strips and sterile dressing applied.  She was placed into a  ?knee immobilizer, awakened, and transported to recovery in stable condition. ? ?Please note that a surgical assistant was of medical necessity for this procedure to do it in a safe and expeditious manner.  Surgical assistant was necessary for retraction of vital ligaments and neurovascular structures and for proper positioning of  ?the limb for safe removal of the old implant and for safe and accurate placement of the new implant.  ? ? ? ?[place] ?D: 12/15/2021 3:13:10 pm T: 12/16/2021 2:20:00 am  ?JOB: [id number]/ [id number]  ?

## 2021-12-16 NOTE — Progress Notes (Signed)
The dayshift nurse gave report on the patient to night shift ICU nurse for transfer. Patient was transferred down to the ICU to room 1237 by Charge Nurse and night shift RN. Patient was alert upon transfer. Night shift RN attempted to contact patient's son, but could not get a hold of the patient's soon. Passed along to ICU nurse to see if they can get a hold of the patient's son for updates on the patient. ?

## 2021-12-16 NOTE — Significant Event (Signed)
Rapid Response Event Note  ? ?Reason for Call :  ?Patient somnolent and breathing 6 times per minute ? ?Initial Focused Assessment:  ?Patient laying in bed, arouses easily to voice and responds appropriately to questions. '1mg'$  morphine given at 0519, '1mg'$  dilaudid given 1008, '10mg'$  oxycodone given at 0630 and 1134. Noted to have AKI superimposed on CRF. Patient appeared hypotensive on monitor, however blood pressure cuff was incorrect size for location on upper arm.  ? ? ?Interventions:  ?Blood pressure cuff changed to correct size ?Narcan administered, patient appeared to very briefly more alert; unsure if related to narcan administration or stimulation by RNs.  ? ?Plan of Care:  ?Dr. Olevia Bowens to go to bedside to assess patient for possible SDU admission. Patient BP 108/71 upon RRT departure. Advised bedside RN to call for any concerns.  ? ? ?Event Summary:  ? ?MD Notified: Dr. Olevia Bowens ?Call Time: 1706 ?Arrival Time: 1709 ?End Time: 1728 ? ?Cyndie Chime, RN ?

## 2021-12-16 NOTE — Significant Event (Signed)
Rapid Response Event Note  ? ?Reason for Call :  ?Intermittent chest pain w/ increasing intensity ? ?Initial Focused Assessment:  ?Patient upright in chair. SBP in 80s, HR sinus in 80s. O2 sat 88% on RA. Oriented x4. ? ?Pt noted to cough on water, reports it has been happening at home and has history of having esophagus stretched. ? ?EKG obtained, NSR, unremarkable.  ? ?Interventions:  ?2L [place] applied ?227m bolus per MD ?Troponin  ?CXR ?Maalox given ?Additional 500 mL bolus ?Plan of Care:  ?[facility] consulted. Patient placed on bedside O2 monitor. Expressed to provider that presentation and location of pain looked like indigestion, but concern expressed with regard to hypotension and new oxygen requirement.  ? ?Pt SBP improved with additional 5075mbolus.  ? ? ?Event Summary:  ? ?MD Notified: Dr AlMaureen RalphsCall Time: 1213 ?Arrival Time: 1218 ?End Time:1335 ? ?ZoCyndie ChimeRN ?

## 2021-12-17 ENCOUNTER — Inpatient Hospital Stay (HOSPITAL_COMMUNITY): Payer: Medicare Other

## 2021-12-17 ENCOUNTER — Other Ambulatory Visit (HOSPITAL_COMMUNITY): Payer: Self-pay

## 2021-12-17 DIAGNOSIS — T84018A Broken internal joint prosthesis, other site, initial encounter: Secondary | ICD-10-CM | POA: Diagnosis not present

## 2021-12-17 DIAGNOSIS — D62 Acute posthemorrhagic anemia: Secondary | ICD-10-CM | POA: Diagnosis not present

## 2021-12-17 DIAGNOSIS — Z96659 Presence of unspecified artificial knee joint: Secondary | ICD-10-CM

## 2021-12-17 LAB — CBC
HCT: 27.6 % — ABNORMAL LOW (ref 36.0–46.0)
Hemoglobin: 8.7 g/dL — ABNORMAL LOW (ref 12.0–15.0)
MCH: 29.1 pg (ref 26.0–34.0)
MCHC: 31.5 g/dL (ref 30.0–36.0)
MCV: 92.3 fL (ref 80.0–100.0)
Platelets: 205 10*3/uL (ref 150–400)
RBC: 2.99 MIL/uL — ABNORMAL LOW (ref 3.87–5.11)
RDW: 13.9 % (ref 11.5–15.5)
WBC: 10.7 10*3/uL — ABNORMAL HIGH (ref 4.0–10.5)
nRBC: 0 % (ref 0.0–0.2)

## 2021-12-17 LAB — CBC WITH DIFFERENTIAL/PLATELET
Abs Immature Granulocytes: 0.03 10*3/uL (ref 0.00–0.07)
Basophils Absolute: 0 10*3/uL (ref 0.0–0.1)
Basophils Relative: 0 %
Eosinophils Absolute: 0.2 10*3/uL (ref 0.0–0.5)
Eosinophils Relative: 2 %
HCT: 28.2 % — ABNORMAL LOW (ref 36.0–46.0)
Hemoglobin: 8.8 g/dL — ABNORMAL LOW (ref 12.0–15.0)
Immature Granulocytes: 0 %
Lymphocytes Relative: 20 %
Lymphs Abs: 2.1 10*3/uL (ref 0.7–4.0)
MCH: 28.7 pg (ref 26.0–34.0)
MCHC: 31.2 g/dL (ref 30.0–36.0)
MCV: 91.9 fL (ref 80.0–100.0)
Monocytes Absolute: 1.2 10*3/uL — ABNORMAL HIGH (ref 0.1–1.0)
Monocytes Relative: 12 %
Neutro Abs: 6.7 10*3/uL (ref 1.7–7.7)
Neutrophils Relative %: 66 %
Platelets: 217 10*3/uL (ref 150–400)
RBC: 3.07 MIL/uL — ABNORMAL LOW (ref 3.87–5.11)
RDW: 13.8 % (ref 11.5–15.5)
WBC: 10.1 10*3/uL (ref 4.0–10.5)
nRBC: 0 % (ref 0.0–0.2)

## 2021-12-17 LAB — URINALYSIS, ROUTINE W REFLEX MICROSCOPIC
Bilirubin Urine: NEGATIVE
Glucose, UA: NEGATIVE mg/dL
Hgb urine dipstick: NEGATIVE
Ketones, ur: NEGATIVE mg/dL
Nitrite: NEGATIVE
Protein, ur: NEGATIVE mg/dL
Specific Gravity, Urine: 1.012 (ref 1.005–1.030)
pH: 5 (ref 5.0–8.0)

## 2021-12-17 LAB — HEPATIC FUNCTION PANEL
ALT: 17 U/L (ref 0–44)
AST: 42 U/L — ABNORMAL HIGH (ref 15–41)
Albumin: 3.4 g/dL — ABNORMAL LOW (ref 3.5–5.0)
Alkaline Phosphatase: 61 U/L (ref 38–126)
Bilirubin, Direct: 0.1 mg/dL (ref 0.0–0.2)
Indirect Bilirubin: 0.7 mg/dL (ref 0.3–0.9)
Total Bilirubin: 0.8 mg/dL (ref 0.3–1.2)
Total Protein: 6.5 g/dL (ref 6.5–8.1)

## 2021-12-17 LAB — BASIC METABOLIC PANEL
Anion gap: 8 (ref 5–15)
BUN: 45 mg/dL — ABNORMAL HIGH (ref 8–23)
CO2: 22 mmol/L (ref 22–32)
Calcium: 8.4 mg/dL — ABNORMAL LOW (ref 8.9–10.3)
Chloride: 102 mmol/L (ref 98–111)
Creatinine, Ser: 2.57 mg/dL — ABNORMAL HIGH (ref 0.44–1.00)
GFR, Estimated: 19 mL/min — ABNORMAL LOW (ref >60)
Glucose, Bld: 92 mg/dL (ref 70–99)
Potassium: 4.2 mmol/L (ref 3.5–5.1)
Sodium: 132 mmol/L — ABNORMAL LOW (ref 135–145)

## 2021-12-17 LAB — CREATININE, URINE, RANDOM: Creatinine, Urine: 81.72 mg/dL

## 2021-12-17 LAB — GLUCOSE, CAPILLARY
Glucose-Capillary: 84 mg/dL (ref 70–99)
Glucose-Capillary: 99 mg/dL (ref 70–99)
Glucose-Capillary: 98 mg/dL (ref 70–99)
Glucose-Capillary: 86 mg/dL (ref 70–99)

## 2021-12-17 LAB — T4, FREE: Free T4: 0.79 ng/dL (ref 0.61–1.12)

## 2021-12-17 LAB — OSMOLALITY, URINE: Osmolality, Ur: 322 mOsm/kg (ref 300–900)

## 2021-12-17 LAB — TSH: TSH: 2.24 u[IU]/mL (ref 0.350–4.500)

## 2021-12-17 LAB — SODIUM, URINE, RANDOM: Sodium, Ur: 41 mmol/L

## 2021-12-17 LAB — AMMONIA: Ammonia: <10 umol/L (ref 9–35)

## 2021-12-17 MED ORDER — SODIUM CHLORIDE 0.9 % IV SOLN: INTRAVENOUS | Status: DC

## 2021-12-17 MED ORDER — HEPARIN SODIUM (PORCINE) 5000 UNIT/ML IJ SOLN
5000.0000 [IU] | Freq: Three times a day (TID) | INTRAMUSCULAR | Status: DC
  Administered 2021-12-18: 5000 [IU] via SUBCUTANEOUS
  Filled 2021-12-17: qty 1

## 2021-12-17 MED ORDER — OXYCODONE HCL 5 MG PO TABS
5.0000 mg | ORAL_TABLET | Freq: Four times a day (QID) | ORAL | 0 refills | Status: DC | PRN
  Filled 2021-12-17: qty 42, 6d supply, fill #0

## 2021-12-17 MED ORDER — OXYCODONE HCL 5 MG PO TABS
5.0000 mg | ORAL_TABLET | ORAL | Status: DC | PRN
  Administered 2021-12-17: 5 mg via ORAL
  Filled 2021-12-17: qty 1
  Filled 2021-12-17: qty 2

## 2021-12-17 MED ORDER — ACETAMINOPHEN 500 MG PO TABS
1000.0000 mg | ORAL_TABLET | Freq: Three times a day (TID) | ORAL | Status: AC
  Administered 2021-12-17 – 2021-12-18 (×4): 1000 mg via ORAL
  Filled 2021-12-17 (×4): qty 2

## 2021-12-17 MED ORDER — METHOCARBAMOL 500 MG PO TABS
500.0000 mg | ORAL_TABLET | Freq: Four times a day (QID) | ORAL | 0 refills | Status: DC | PRN
  Filled 2021-12-17: qty 40, 10d supply, fill #0

## 2021-12-17 MED ORDER — RIVAROXABAN 10 MG PO TABS
10.0000 mg | ORAL_TABLET | Freq: Every day | ORAL | 0 refills | Status: DC
  Filled 2021-12-17: qty 18, 18d supply, fill #0

## 2021-12-17 MED ORDER — BUPROPION HCL ER (SR) 150 MG PO TB12
150.0000 mg | ORAL_TABLET | Freq: Every day | ORAL | Status: DC
  Administered 2021-12-17 – 2021-12-18 (×2): 150 mg via ORAL
  Filled 2021-12-17 (×2): qty 1

## 2021-12-17 MED ORDER — OXYCODONE HCL 5 MG PO TABS
5.0000 mg | ORAL_TABLET | ORAL | Status: DC | PRN
  Administered 2021-12-17 – 2021-12-18 (×5): 5 mg via ORAL
  Filled 2021-12-17 (×5): qty 1

## 2021-12-17 MED ORDER — GABAPENTIN 100 MG PO CAPS
100.0000 mg | ORAL_CAPSULE | Freq: Two times a day (BID) | ORAL | Status: DC
  Administered 2021-12-17: 100 mg via ORAL
  Filled 2021-12-17: qty 1

## 2021-12-17 MED ORDER — SODIUM CHLORIDE 0.9 % IV BOLUS
500.0000 mL | Freq: Once | INTRAVENOUS | Status: AC
  Administered 2021-12-17: 500 mL via INTRAVENOUS

## 2021-12-17 MED ORDER — MELATONIN 5 MG PO TABS
20.0000 mg | ORAL_TABLET | Freq: Every evening | ORAL | Status: DC | PRN
  Administered 2021-12-17 – 2021-12-18 (×2): 20 mg via ORAL
  Filled 2021-12-17 (×2): qty 4

## 2021-12-17 NOTE — Progress Notes (Addendum)
Physical Therapy Treatment ?Patient Details ?Name: Annette Saunders ?MRN: [id number] ?DOB: 30-Sep-1945 ?Today's Date: 12/17/2021 ? ? ?History of Present Illness Pt is a 76yo female presenting s/p R-TKR on 12/15/21. PMH: OA, HTN, GERD, HLD, b/l knee replacement 2008. ? ?  ?PT Comments  ? ? POD # 2 am session ?Pt seen in step down unit RM# [id number] ?Pt AxO 2 following all commands but does not recall events from yesterday.  Pleasant and willing.  C/O MAX fatigue/sleepy.  Assisted OOB required + 2 assist and increased time.  General bed mobility comments: required increased assist to transition to EOB with Max Asisst + 2 and extra effort to scoot to EOB all while supporting R LE.  General transfer comment: 50% VC's on proper hand placement and increased time to rise.  Also assisted with a toilet transfer.  Unsteady.  Shaky.General Gait Details: limited distance of 12 feet z 2 to and from bathroom.  Slow/sluggish/unsteady.  Positioned in recliner and performed a few TKR TE's followed by ICE.  RA during activity >95% but at rest avg 88% so reapplied after session complete.   ?Biggest complaints was weakness/fatigue/resting tremors.  "[name]". "[name]".   ?Recommendations for follow up therapy are one component of a multi-disciplinary discharge planning process, led by the attending physician.  Recommendations may be updated based on patient status, additional functional criteria and insurance authorization. ? ?Follow Up Recommendations ? Follow physician's recommendations for discharge plan and follow up therapies ?  ?  ?Assistance Recommended at Discharge Intermittent Supervision/Assistance  ?Patient can return home with the following A little help with walking and/or transfers;A little help with bathing/dressing/bathroom;Assistance with cooking/housework;Assist for transportation;Help with stairs or ramp for entrance ?  ?Equipment Recommendations ? Rolling walker (2 wheels);BSC/3in1  ?  ?Recommendations  for Other Services   ? ? ?  ?Precautions / Restrictions Precautions ?Precautions: Fall ?Precaution Comments: instructed no pillow under knee ?Required Braces or Orthoses: Knee Immobilizer - Right ?Knee Immobilizer - Right: On when out of bed or walking ?Restrictions ?Weight Bearing Restrictions: No ?RLE Weight Bearing: Weight bearing as tolerated  ?  ? ?Mobility ? Bed Mobility ?Overal bed mobility: Needs Assistance ?Bed Mobility: Supine to Sit ?  ?  ?Supine to sit: Max assist, +2 for physical assistance, +2 for safety/equipment ?  ?  ?General bed mobility comments: required increased assist to transition to EOB with Max Asisst + 2 and extra effort to scoot to EOB all while supporting R LE. ?  ? ?Transfers ?Overall transfer level: Needs assistance ?Equipment used: Rolling walker (2 wheels) ?Transfers: Sit to/from Stand ?Sit to Stand: Max assist, +2 physical assistance, +2 safety/equipment ?  ?  ?  ?  ?  ?General transfer comment: 50% VC's on proper hand placement and increased time to rise.  Also assisted with a toilet transfer.  Unsteady.  Shaky. ?  ? ?Ambulation/Gait ?Ambulation/Gait assistance: Mod assist, Max assist ?Gait Distance (Feet): 24 Feet (12 feet x 2) ?Assistive device: Rolling walker (2 wheels) ?Gait Pattern/deviations: Decreased step length - right, Decreased step length - left, Decreased stance time - right ?Gait velocity: decreased ?  ?  ?General Gait Details: limited distance of 12 feet z 2 to and from bathroom.  Slow/sluggish/unsteady. ? ? ?Stairs ?  ?  ?  ?  ?  ? ? ?Wheelchair Mobility ?  ? ?Modified Rankin (Stroke Patients Only) ?  ? ? ?  ?Balance   ?  ?  ?  ?  ?  ?  ?  ?  ?  ?  ?  ?  ?  ?  ?  ?  ?  ?  ?  ? ?  ?  Cognition Arousal/Alertness: Awake/alert ?Behavior During Therapy: [facility] for tasks assessed/performed ?Overall Cognitive Status: Within Functional Limits for tasks assessed ?  ?  ?  ?  ?  ?  ?  ?  ?  ?  ?  ?  ?  ?  ?  ?  ?General Comments: AxO x 3 pleasant originally from [facility] ?  ?   ? ?  ?Exercises  Total Knee Replacement TE's following HEP handout ?10 reps B LE ankle pumps ?05 reps towel squeezes ?05 reps knee presses ?05 reps heel slides  ?05 reps SAQ's ?05 reps SLR's ?05 reps ABD ?Educated on use of gait belt to assist with TE's ?Followed by ICE ? ? ?  ?General Comments   ?  ?  ? ?Pertinent Vitals/Pain Pain Assessment ?Pain Assessment: 0-10 ?Pain Score: 7  ?Pain Location: R knee ?Pain Descriptors / Indicators: Cramping, Discomfort, Operative site guarding ?Pain Intervention(s): Monitored during session, Premedicated before session, Repositioned, Ice applied  ? ? ?Home Living   ?  ?  ?  ?  ?  ?  ?  ?  ?  ?   ?  ?Prior Function    ?  ?  ?   ? ?PT Goals (current goals can now be found in the care plan section) Progress towards PT goals: Progressing toward goals ? ?  ?Frequency ? ? ? 7X/week ? ? ? ?  ?PT Plan Current plan remains appropriate  ? ? ?Co-evaluation   ?  ?  ?  ?  ? ?  ?AM-PAC PT "6 Clicks" Mobility   ?Outcome Measure ? Help needed turning from your back to your side while in a flat bed without using bedrails?: A Lot ?Help needed moving from lying on your back to sitting on the side of a flat bed without using bedrails?: A Lot ?Help needed moving to and from a bed to a chair (including a wheelchair)?: A Lot ?Help needed standing up from a chair using your arms (e.g., wheelchair or bedside chair)?: A Lot ?Help needed to walk in hospital room?: A Lot ?Help needed climbing 3-5 steps with a railing? : Total ?6 Click Score: 11 ? ?  ?End of Session Equipment Utilized During Treatment: Gait belt ?Activity Tolerance: Patient limited by fatigue ?Patient left: in chair;with call bell/phone within reach ?Nurse Communication: Mobility status ?PT Visit Diagnosis: Pain;Difficulty in walking, not elsewhere classified (R26.2) ?Pain - Right/Left: Right ?Pain - part of body: Knee ?  ? ? ?Time: [phone] ?PT Time Calculation (min) (ACUTE ONLY): 33 min ? ?Charges:  $Gait Training: 8-22  mins ?$Therapeutic Activity: 8-22 mins          ?          ?Rica Koyanagi  PTA ?Acute  Rehabilitation Services ?Pager      [phone] ?Office      [phone] ? ?

## 2021-12-17 NOTE — Progress Notes (Signed)
Physical Therapy Treatment ?Patient Details ?Name: Annette Saunders ?MRN: [id number] ?DOB: [birth year] ?Today's Date: 12/17/2021 ? ? ?History of Present Illness Pt is a 76yo female presenting s/p R-TKR on 12/15/21. PMH: OA, HTN, GERD, HLD, b/l knee replacement 2008. ? ?  ?PT Comments  ? ? POD # 2 pm session ?Pt seen in Step Down RM # [id number].  Pt tolerated OOB in recliner 2 1/2 hours.  Still sleepy/groggy but improving.  Assisted with amb to bathroom + 2 assist.  Unsteady.  50% VC's on proper walker to self distance and increased assist with turns and back stepping.  Assisted with amb from bathroom to bed.  Avg RA 96%.  Knee pain 7/10 pain.  Assisted back to bed, positioned to comfort, applied SCD's and ICE to R knee.   ?Cognition/alertness improved from yesterday but still easily drifts off to sleep and present with intermittent confusion.  Pt asked, "how much was lunch" (cost).  Pt unable to recall anything from yesterday.    ?Recommendations for follow up therapy are one component of a multi-disciplinary discharge planning process, led by the attending physician.  Recommendations may be updated based on patient status, additional functional criteria and insurance authorization. ? ?Follow Up Recommendations ? Follow physician's recommendations for discharge plan and follow up therapies ?  ?  ?Assistance Recommended at Discharge Intermittent Supervision/Assistance  ?Patient can return home with the following A little help with walking and/or transfers;A little help with bathing/dressing/bathroom;Assistance with cooking/housework;Assist for transportation;Help with stairs or ramp for entrance ?  ?Equipment Recommendations ? Rolling walker (2 wheels);BSC/3in1  ?  ?Recommendations for Other Services   ? ? ?  ?Precautions / Restrictions Precautions ?Precautions: Fall ?Precaution Comments: instructed no pillow under knee ?Required Braces or Orthoses: Knee Immobilizer - Right ?Knee Immobilizer - Right: On when out of bed or  walking ?Restrictions ?Weight Bearing Restrictions: No ?RLE Weight Bearing: Weight bearing as tolerated  ?  ? ?Mobility ? Bed Mobility ?Overal bed mobility: Needs Assistance ?Bed Mobility: Sit to Supine ?  ?  ?Supine to sit: Max assist, +2 for physical assistance, +2 for safety/equipment ?Sit to supine: Max assist, +2 for physical assistance, +2 for safety/equipment ?  ?General bed mobility comments: required + 2 Max Assist back to bed, positioned to comfort and elevated R LE plus applied ICE. ?  ? ?Transfers ?Overall transfer level: Needs assistance ?Equipment used: Rolling walker (2 wheels) ?Transfers: Sit to/from Stand ?Sit to Stand: Mod assist, +2 physical assistance, +2 safety/equipment ?  ?  ?  ?  ?  ?General transfer comment: 50% VC's on proper hand placement and increased time to rise.  Also assisted with a toilet transfer.  Unsteady.  Shaky. ?  ? ?Ambulation/Gait ?Ambulation/Gait assistance: Mod assist, +2 physical assistance, +2 safety/equipment ?Gait Distance (Feet): 24 Feet ?Assistive device: Rolling walker (2 wheels) ?Gait Pattern/deviations: Decreased step length - right, Decreased step length - left, Decreased stance time - right ?Gait velocity: decreased ?  ?  ?General Gait Details: limited distance of 12 feet z 2 to and from bathroom.  Slow/sluggish/unsteady.  Amb without KI.   Avg RA 96% ? ? ?Stairs ?  ?  ?  ?  ?  ? ? ?Wheelchair Mobility ?  ? ?Modified Rankin (Stroke Patients Only) ?  ? ? ?  ?Balance   ?  ?  ?  ?  ?  ?  ?  ?  ?  ?  ?  ?  ?  ?  ?  ?  ?  ?  ?  ? ?  ?  Cognition Arousal/Alertness: Awake/alert ?Behavior During Therapy: [facility] for tasks assessed/performed ?Overall Cognitive Status: Within Functional Limits for tasks assessed ?  ?  ?  ?  ?  ?  ?  ?  ?  ?  ?  ?  ?  ?  ?  ?  ?General Comments: AxO x 3 pleasant originally from [facility] ?  ?  ? ?  ?Exercises   ? ?  ?General Comments   ?  ?  ? ?Pertinent Vitals/Pain Pain Assessment ?Pain Assessment: 0-10 ?Pain Score: 8  ?Pain Location: R  knee ?Pain Descriptors / Indicators: Cramping, Discomfort, Operative site guarding ?Pain Intervention(s): Monitored during session, Premedicated before session, Repositioned, Ice applied  ? ? ?Home Living   ?  ?  ?  ?  ?  ?  ?  ?  ?  ?   ?  ?Prior Function    ?  ?  ?   ? ?PT Goals (current goals can now be found in the care plan section) Progress towards PT goals: Progressing toward goals ? ?  ?Frequency ? ? ? 7X/week ? ? ? ?  ?PT Plan Current plan remains appropriate  ? ? ?Co-evaluation   ?  ?  ?  ?  ? ?  ?AM-PAC PT "6 Clicks" Mobility   ?Outcome Measure ? Help needed turning from your back to your side while in a flat bed without using bedrails?: A Lot ?Help needed moving from lying on your back to sitting on the side of a flat bed without using bedrails?: A Lot ?Help needed moving to and from a bed to a chair (including a wheelchair)?: A Lot ?Help needed standing up from a chair using your arms (e.g., wheelchair or bedside chair)?: A Lot ?Help needed to walk in hospital room?: A Lot ?Help needed climbing 3-5 steps with a railing? : Total ?6 Click Score: 11 ? ?  ?End of Session Equipment Utilized During Treatment: Gait belt ?Activity Tolerance: Patient limited by fatigue ?Patient left: in bed;with call bell/phone within reach;with bed alarm set;with nursing/sitter in room ?Nurse Communication: Mobility status ?PT Visit Diagnosis: Pain;Difficulty in walking, not elsewhere classified (R26.2) ?Pain - Right/Left: Right ?Pain - part of body: Knee ?  ? ? ?Time: 9563-8756 ?PT Time Calculation (min) (ACUTE ONLY): 26 min ? ?Charges:  $Gait Training: 8-22 mins ?$Therapeutic Activity: 8-22 mins          ?          ?Rica Koyanagi  PTA ?Acute  Rehabilitation Services ?Pager      [phone] ?Office      [phone] ? ?

## 2021-12-17 NOTE — Progress Notes (Signed)
Patient had foley catheter removed 4/20 and first due to void at 1800 was not met. Nurse did in and out cath per provider instruction prior to transfer. PT had 220 out at this time. Due to void not met by 0000. Patient states she does not feel like she has to urinate and bladder scan showed only 74 ml of urine in bladder. ? ?Next due to void not met at 0600. Patient still denies feeling like she has to urinate. Bladder scans repeated and measured at 79 ml and 99 ml. Will pass on to dayshift nurse next time patient is due to void.  ? ? ?

## 2021-12-17 NOTE — Progress Notes (Signed)
? ?  Subjective: ?2 Days Post-Op Procedure(s) (LRB): ?TOTAL KNEE REVISION (Right) ?Patient reports pain as mild.   ?Patient seen in rounds by Dr. Wynelle Link. ?Patient appears better this AM, chest pain has improved. Reports she believes the grogginess yesterday was due to her Wellbutrin. Just recently increased dosing last week to BID per her PCP. Will discontinue AM dose, resume QHS dosing. Had issues with voiding last night, was straight cathed at 11pm with little bladder volume throughout the night.  ?Plan is to go Home after hospital stay. ? ?Objective: ?Vital signs in last 24 hours: ?Temp:  [97.7 ?F (36.5 ?C)-98.6 ?F (37 ?C)] 97.9 ?F (36.6 ?C) (04/21 0300) ?Pulse Rate:  [73-114] 81 (04/21 0408) ?Resp:  [6-20] 13 (04/21 0408) ?BP: (68-170)/(36-143) 117/58 (04/21 0408) ?SpO2:  [88 %-100 %] 98 % (04/21 0408) ? ?Intake/Output from previous day: ? ?Intake/Output Summary (Last 24 hours) at 12/17/2021 0728 ?Last data filed at 12/17/2021 0539 ?Gross per 24 hour  ?Intake 1681.74 ml  ?Output 200 ml  ?Net 1481.74 ml  ?  ?Intake/Output this shift: ?No intake/output data recorded. ? ?Labs: ?Recent Labs  ?  12/16/21 ?0301 12/17/21 ?7673  ?HGB 10.0* 8.7*  ? ?Recent Labs  ?  12/16/21 ?0301 12/17/21 ?4193  ?WBC 9.2 10.7*  ?RBC 3.47* 2.99*  ?HCT 32.0* 27.6*  ?PLT 233 205  ? ?Recent Labs  ?  12/16/21 ?1223 12/17/21 ?0645  ?NA 133* 132*  ?K 5.2* 4.2  ?CL 103 102  ?CO2 21* 22  ?BUN 38* 45*  ?CREATININE 2.12* 2.57*  ?GLUCOSE 133* 92  ?CALCIUM 8.5* 8.4*  ? ?No results for input(s): LABPT, INR in the last 72 hours. ? ?Exam: ?General - Patient is Alert and Oriented ?Extremity - Neurologically intact ?Neurovascular intact ?Sensation intact distally ?Dorsiflexion/Plantar flexion intact ?Dressing/Incision - clean, dry, no drainage ?Motor Function - intact, moving foot and toes well on exam.  ? ?Past Medical History:  ?Diagnosis Date  ? Anxiety   ? Back pain   ? Essential hypertension, benign 06/06/2019  ? Generalized osteoarthritis   ? GERD  (gastroesophageal reflux disease)   ? Grade I diastolic dysfunction 7/90/2409  ? Hyperlipidemia   ? Hypothyroidism, adult   ? Insomnia   ? Joint pain   ? Lactose intolerance   ? Lower extremity edema   ? Malaise and fatigue 06/06/2019  ? Obesity (BMI 30.0-34.9) 06/06/2019  ? Stage 3a chronic kidney disease (CKD) ([place]) 12/16/2021  ? ? ?Assessment/Plan: ?2 Days Post-Op Procedure(s) (LRB): ?TOTAL KNEE REVISION (Right) ?Principal Problem: ?  Failed total knee arthroplasty ([place]) ?Active Problems: ?  GERD (gastroesophageal reflux disease) ?  Essential hypertension, benign ?  Hypothyroidism, adult ?  AKI (acute kidney injury) ([place]) ?  Hyperkalemia ?  Normocytic anemia ?  Hyponatremia ?  Stage 3a chronic kidney disease (CKD) ([place]) ?  Grade I diastolic dysfunction ? ?Estimated body mass index is 42.97 kg/m? as calculated from the following: ?  Height as of this encounter: 5' (1.524 m). ?  Weight as of this encounter: 99.8 kg. ?Up with therapy ? ?DVT Prophylaxis - Xarelto ?Weight-bearing as tolerated ? ?Alertness and chest pain improved from yesterday. ?Concerned about kidney function, creatinine increased to 2.57 this AM. ?Appreciative of hospitalist's assistance with management.  ? ?Theresa Duty, PA-C ?Orthopedic Surgery ?[phone] ?12/17/2021, 7:28 AM ? ?

## 2021-12-17 NOTE — Progress Notes (Signed)
?  Progress Note ?PatientEdell Saunders UUV:[id number] DOB: 1946-07-28 DOA: 12/15/2021  ?DOS: the patient was seen and examined on 12/17/2021 ? ?Brief hospital course: ? Annette Saunders is a 76 y.o. female with past medical history of anxiety, chronic back pain, essential hypertension, generalized osteoarthritis, GERD, hyperlipidemia, insomnia, hypothyroidism, joint pain, lactose intolerance, lower extremity edema, malaise, fatigue, class III obesity who we are seeing due to chest pain, hypotension and AKI with hyperkalemia after undergoing a knee revision this admission ? ?Assessment and Plan: ?Acute toxic encephalopathy. ?From pain medications. ?Currently minimizing the pain medication.  Received Narcan. ?Also minimizing gabapentin. ?Patient does have asterixis on examination. ?No focal deficit. ?Monitor. ? ?Acute kidney injury on CKD 3A.  Hyponatremia.  Hyperkalemia. ?Aggressive IV hydration. ?Urine output now finally improving. ?Monitor. ?Most likely hemodynamic injury in the setting of hypotension. ?Renal dosing of all medications including DVT prophylaxis. ? ?Acute postop anemia. ?Hemoglobin remaining stable potential blood loss expected. ?No active bleeding. ?No evidence of hematoma so far. ? ?Morbid obesity ?Placing the patient at high risk for poor outcome. ?Patient may be suffering from OSA which might also be responsible for encephalopathy after anesthesia and pain medication. ?Body mass index is 42.97 kg/m?.  ? ?Subjective: Denies any acute complaint.  No nausea no vomiting.  Pain well controlled. ? ?Physical Exam: ?Vitals:  ? 12/17/21 1400 12/17/21 1534 12/17/21 1600 12/17/21 1900  ?BP:   (!) 112/46   ?Pulse: 94  95   ?Resp: (!) 22  20   ?Temp:  98.3 ?F (36.8 ?C)  98.8 ?F (37.1 ?C)  ?TempSrc:  Oral  Axillary  ?SpO2: 91%  96%   ?Weight:      ?Height:      ? ?General: Appear in mild distress; no visible Abnormal Neck Mass Or lumps, Conjunctiva normal ?Cardiovascular: S1 and S2 Present, no  Murmur, ?Respiratory: good respiratory effort, Bilateral Air entry present and CTA, no Crackles, no wheezes ?Abdomen: Bowel Sound present, Non tender  ?Extremities: Trace pedal edema ?Neurology: alert and oriented to time, place, and person ?Gait not checked due to patient safety concerns  ? ?Data Reviewed: ?I have Reviewed nursing notes, Vitals, and Lab results since pt's last encounter. Pertinent lab results CBC BMP ?I have ordered test including CBC BMP ?Discussed with orthopedics ?Family Communication: None at bedside ? ?Disposition: ?Status is: Inpatient ?Remains inpatient appropriate because: Still encephalopathic requiring close observation and titration of medications.  Acute kidney injury requiring IV hydration. ? ?Author: ?Berle Mull, MD ?12/17/2021 8:05 PM ? ?Please look on [email or web address] to find out who is on call. ?

## 2021-12-17 NOTE — Progress Notes (Signed)
Patient was a rapid response admit to SDU for somnolence. During morning rounds patient mentioned that she did not want to take a certain medication anymore because it makes her too sleepy. She told me that her pcp recently changed her wellbutrin from once daily at bedtime to twice a day and that the daytime dose made her sleep all day. I will pass along to daytime nurse that patient reported that she did not want this medication so that the nurse can ask her before administering it at 1000.  ?

## 2021-12-18 ENCOUNTER — Other Ambulatory Visit (HOSPITAL_COMMUNITY): Payer: Self-pay

## 2021-12-18 DIAGNOSIS — T84018A Broken internal joint prosthesis, other site, initial encounter: Secondary | ICD-10-CM | POA: Diagnosis not present

## 2021-12-18 DIAGNOSIS — Z96659 Presence of unspecified artificial knee joint: Secondary | ICD-10-CM | POA: Diagnosis not present

## 2021-12-18 LAB — BASIC METABOLIC PANEL
Anion gap: <3 — ABNORMAL LOW (ref 5–15)
BUN: 35 mg/dL — ABNORMAL HIGH (ref 8–23)
CO2: 22 mmol/L (ref 22–32)
Calcium: 8.2 mg/dL — ABNORMAL LOW (ref 8.9–10.3)
Chloride: 113 mmol/L — ABNORMAL HIGH (ref 98–111)
Creatinine, Ser: 1.21 mg/dL — ABNORMAL HIGH (ref 0.44–1.00)
GFR, Estimated: 47 mL/min — ABNORMAL LOW (ref >60)
Glucose, Bld: 107 mg/dL — ABNORMAL HIGH (ref 70–99)
Potassium: 4.4 mmol/L (ref 3.5–5.1)
Sodium: 137 mmol/L (ref 135–145)

## 2021-12-18 LAB — GLUCOSE, CAPILLARY
Glucose-Capillary: 106 mg/dL — ABNORMAL HIGH (ref 70–99)
Glucose-Capillary: 89 mg/dL (ref 70–99)
Glucose-Capillary: 106 mg/dL — ABNORMAL HIGH (ref 70–99)
Glucose-Capillary: 122 mg/dL — ABNORMAL HIGH (ref 70–99)

## 2021-12-18 MED ORDER — RIVAROXABAN 10 MG PO TABS
10.0000 mg | ORAL_TABLET | Freq: Every day | ORAL | Status: DC
  Administered 2021-12-18 – 2021-12-19 (×2): 10 mg via ORAL
  Filled 2021-12-18 (×2): qty 1

## 2021-12-18 NOTE — Progress Notes (Signed)
?  Progress Note ?PatientDemeshia Saunders ZRA:[id number] DOB: 08/28/1946 DOA: 12/15/2021  ?DOS: the patient was seen and examined on 12/18/2021 ? ?Brief hospital course: ? Annette Saunders is a 76 y.o. female with past medical history of anxiety, chronic back pain, essential hypertension, generalized osteoarthritis, GERD, hyperlipidemia, insomnia, hypothyroidism, joint pain, lactose intolerance, lower extremity edema, malaise, fatigue, class III obesity who we are seeing due to chest pain, hypotension and AKI with hyperkalemia after undergoing a knee revision this admission ? ?Assessment and Plan: ?Acute toxic encephalopathy. ?From pain medications. ?Currently minimizing the pain medication.  Received Narcan. ?Also minimizing gabapentin. ?No focal deficit. ?Asterixis improving significantly. Monitor. ? ?Acute kidney injury on CKD 3A.  Hyponatremia.  Hyperkalemia. ?Aggressive IV hydration. ?Renal function significantly improved.  Continue to monitor with IV fluids on hold. ?Most likely hemodynamic injury in the setting of hypotension. ?Renal dosing of all medications including DVT prophylaxis. ? ?Acute postop anemia. ?Hemoglobin remaining stable potential blood loss expected. ?No active bleeding. ?No evidence of hematoma so far. ? ?Morbid obesity ?Placing the patient at high risk for poor outcome. ?Patient may be suffering from OSA which might also be responsible for encephalopathy after anesthesia and pain medication. ?Body mass index is 42.97 kg/m?.  ? ?Subjective: Denies any acute complaint.  No nausea no vomiting.  Pain well controlled. ? ?Physical Exam: ?Vitals:  ? 12/18/21 1600 12/18/21 1656 12/18/21 1700 12/18/21 1800  ?BP: (!) 118/53     ?Pulse: 81  86 84  ?Resp: '12  18 15  '$ ?Temp:  98.2 ?F (36.8 ?C)    ?TempSrc:  Oral    ?SpO2: 95%  100% 100%  ?Weight:      ?Height:      ? ?General: Appear in mild distress, no Rash; Oral Mucosa Clear, moist. no Abnormal Neck Mass Or lumps, Conjunctiva normal  ?Cardiovascular: S1  and S2 Present, no Murmur, ?Respiratory: good respiratory effort, Bilateral Air entry present and CTA, no Crackles, no wheezes ?Abdomen: Bowel Sound present, Soft and no tenderness ?Extremities: no Pedal edema ?Neurology: alert and oriented to time, place, and person ?affect appropriate. no new focal deficit ?Gait not checked due to patient safety concerns   ? ?Data Reviewed: ?I have Reviewed nursing notes, Vitals, and Lab results since pt's last encounter. Pertinent lab results CBC and BMP ?I have ordered test including BMP ?I have discussed pt's care plan and test results with orthopedics.   ?Family Communication: None at bedside ? ?Disposition: ?Status is: Inpatient ?Remains inpatient appropriate because: Encephalopathy improving.  Close observation needed for stabilization of renal function. ? ?Author: ?Berle Mull, MD ?12/18/2021 7:55 PM ? ?Please look on [email or web address] to find out who is on call. ?

## 2021-12-18 NOTE — Progress Notes (Signed)
PT TX NOTE--pm session ? 12/18/21 1355  ?PT Visit Information  ?Last PT Received On 12/18/21 ? ?Pt seen for exercise focused session this afternoon. She just got back to bed with nursing staff. Pt is alert, oriented and feeling well. Pleasant, cooperative and continues to progress with PT.  ?Assistance Needed +2 ?(safety/chair)  ?History of Present Illness Pt is a 75yo female presenting s/p R-TKR on 12/15/21. PMH: OA, HTN, GERD, HLD, b/l knee replacement 2008.  ?Subjective Data  ?Patient Stated Goal To be able to walk without pain  ?Precautions  ?Precautions Fall  ?Precaution Comments instructed no pillow under knee, avoid having knee of bed up  ?Required Braces or Orthoses Knee Immobilizer - Right  ?Knee Immobilizer - Right On when out of bed or walking  ?Restrictions  ?Weight Bearing Restrictions No  ?RLE Weight Bearing WBAT  ?Pain Assessment  ?Pain Assessment Faces  ?Faces Pain Scale 4  ?Pain Location R knee  ?Pain Descriptors / Indicators Cramping;Discomfort;Operative site guarding  ?Pain Intervention(s) Limited activity within patient's tolerance;Monitored during session;Premedicated before session  ?Cognition  ?Arousal/Alertness Awake/alert  ?Behavior During Therapy [facility] for tasks assessed/performed  ?Overall Cognitive Status Within Functional Limits for tasks assessed  ?Total Joint Exercises  ?Ankle Circles/Pumps AROM;Both;10 reps  ?Quad Sets AROM;Both;10 reps  ?Gluteal Sets AROM;Both;10 reps  ?Heel Slides AAROM;Right;10 reps  ?Hip ABduction/ADduction AROM;Right;AAROM;10 reps  ?Straight Leg Raises AROM;AAROM;Right;10 reps  ?Goniometric ROM grossly 5 to 55 degrees flexion R knee  ?PT - End of Session  ?Activity Tolerance Patient tolerated treatment well;Patient limited by fatigue  ?Patient left in bed;with bed alarm set;with nursing/sitter in room;with call bell/phone within reach  ?Nurse Communication Mobility status  ? PT - Assessment/Plan  ?PT Plan Current plan remains appropriate  ?PT Visit Diagnosis  Pain;Difficulty in walking, not elsewhere classified (R26.2)  ?Pain - Right/Left Right  ?Pain - part of body Knee  ?PT Frequency (ACUTE ONLY) 7X/week  ?Follow Up Recommendations Follow physician's recommendations for discharge plan and follow up therapies  ?Assistance recommended at discharge Intermittent Supervision/Assistance  ?Patient can return home with the following A little help with walking and/or transfers;A little help with bathing/dressing/bathroom;Assistance with cooking/housework;Assist for transportation;Help with stairs or ramp for entrance  ?PT equipment Rolling walker (2 wheels);BSC/3in1  ?AM-PAC PT "6 Clicks" Mobility Outcome Measure (Version 2)  ?Help needed turning from your back to your side while in a flat bed without using bedrails? 2  ?Help needed moving from lying on your back to sitting on the side of a flat bed without using bedrails? 2  ?Help needed moving to and from a bed to a chair (including a wheelchair)? 3  ?Help needed standing up from a chair using your arms (e.g., wheelchair or bedside chair)? 3  ?Help needed to walk in hospital room? 3  ?Help needed climbing 3-5 steps with a railing?  2  ?6 Click Score 15  ?Consider Recommendation of Discharge To: CIR/SNF/LTACH  ?Progressive Mobility  ?What is the highest level of mobility based on the progressive mobility assessment? Level 4 (Walks with assist in room) - Balance while marching in place and cannot step forward and back - Complete  ?PT Goal Progression  ?Progress towards PT goals Progressing toward goals  ?Acute Rehab PT Goals  ?PT Goal Formulation With patient  ?Time For Goal Achievement 12/22/21  ?Potential to Achieve Goals Good  ?PT Time Calculation  ?PT Start Time (ACUTE ONLY) 1342  ?PT Stop Time (ACUTE ONLY) 1356  ?PT Time Calculation (min) (ACUTE ONLY)  14 min  ?PT General Charges  ?$$ ACUTE PT VISIT 1 Visit  ?PT Treatments  ?$Therapeutic Exercise 8-22 mins  ? ? ?

## 2021-12-18 NOTE — Progress Notes (Addendum)
? ?  Subjective: ? ?Annette Saunders is a 76 y.o. female, 3 Days Post-Op  ?  s/p Procedure(s): ?TOTAL KNEE REVISION ? ? ?Patient reports pain as moderate.  Today patient reports she is doing okay had increasing pain this morning requiring a pain pill.  She does report some hallucinations when waking up from sleep.  Denies numbness or tingling.  Denies fever or chills.  Does report knee pain. ? ?Objective:  ? ?VITALS:   ?Vitals:  ? 12/18/21 0400 12/18/21 0418 12/18/21 0528 12/18/21 0821  ?BP:  (!) 133/27 (!) 157/51   ?Pulse: 88 89 91   ?Resp: $Remov'14 18 16   'ddmYft$ ?Temp: 98.7 ?F (37.1 ?C)   97.7 ?F (36.5 ?C)  ?TempSrc: Axillary   Axillary  ?SpO2: 100% 97% 96%   ?Weight:      ?Height:      ? ?General: Alert, oriented, in hospital bed ?Neurologically intact ?ABD soft ?Neurovascular intact ?Sensation intact distally ?Intact pulses distally ?Dorsiflexion/Plantar flexion intact ?Incision: dressing C/D/I ?Compartment soft ?Able to move foot, ankle, and toes. ? ?Lab Results  ?Component Value Date  ? WBC 10.1 12/17/2021  ? HGB 8.8 (L) 12/17/2021  ? HCT 28.2 (L) 12/17/2021  ? MCV 91.9 12/17/2021  ? PLT 217 12/17/2021  ? ?BMET ?   ?Component Value Date/Time  ? NA 137 12/18/2021 0247  ? NA 143 11/17/2021 1137  ? K 4.4 12/18/2021 0247  ? CL 113 (H) 12/18/2021 0247  ? CO2 22 12/18/2021 0247  ? GLUCOSE 107 (H) 12/18/2021 0247  ? BUN 35 (H) 12/18/2021 0247  ? BUN 26 11/17/2021 1137  ? CREATININE 1.21 (H) 12/18/2021 0247  ? CREATININE 0.87 12/16/2020 1633  ? CALCIUM 8.2 (L) 12/18/2021 0247  ? EGFR 46 (L) 11/17/2021 1137  ? GFRNONAA 47 (L) 12/18/2021 0247  ? GFRNONAA 66 12/16/2020 1633  ? ? ? ?Assessment/Plan: ?3 Days Post-Op  ? ?Principal Problem: ?  Failed total knee arthroplasty ([place]) ?Active Problems: ?  GERD (gastroesophageal reflux disease) ?  Essential hypertension, benign ?  Hypothyroidism, adult ?  Acute renal failure superimposed on stage 3a chronic kidney disease ([place]) ?  Hyperkalemia ?  Normocytic anemia ?  Hyponatremia ?  Grade I  diastolic dysfunction ?  Acute postoperative anemia due to expected blood loss ? ? ?Advance diet ?Up with therapy ?No chest pain.  Patient alert does report some hallucinations.  Kidney functions greatly improved compared to yesterday, will continue to have hospitalist follow-up and ensure that the labs remain stable.  If her creatinine remains stable below 1.5 or below then it would  be reasonable for discharge tomorrow- Dr. Posey Pronto agrees.  ? ?Weightbearing Status: As tolerated ?DVT Prophylaxis: Xarelto ? ? ?Dutch Gray Delcie Ruppert ?12/18/2021, 8:37 AM ? ?Jonelle Sidle PA-C  ?Physician Assistant with Dr. Lillia Abed [place] ? ?

## 2021-12-18 NOTE — Progress Notes (Addendum)
Physical Therapy Treatment ?Patient Details ?Name: Annette Saunders ?MRN: [id number] ?DOB: 1946/04/13 ?Today's Date: 12/18/2021 ? ? ?History of Present Illness Pt is a 76yo female presenting s/p R-TKR on 12/15/21. PMH: OA, HTN, GERD, HLD, b/l knee replacement 2008. Transferred to ICU d/t toxic encephalopathy ? ?  ?PT Comments  ? ? Pt progressing well, much improved today. More alert, tol incr activity amb ~ 46' with RW and min assist. VSS during PT session   ?Recommendations for follow up therapy are one component of a multi-disciplinary discharge planning process, led by the attending physician.  Recommendations may be updated based on patient status, additional functional criteria and insurance authorization. ? ?Follow Up Recommendations ? Follow physician's recommendations for discharge plan and follow up therapies ?  ?  ?Assistance Recommended at Discharge Intermittent Supervision/Assistance  ?Patient can return home with the following A little help with walking and/or transfers;A little help with bathing/dressing/bathroom;Assistance with cooking/housework;Assist for transportation;Help with stairs or ramp for entrance ?  ?Equipment Recommendations ? Rolling walker (2 wheels);BSC/3in1  ?  ?Recommendations for Other Services   ? ? ?  ?Precautions / Restrictions Precautions ?Precautions: Fall ?Precaution Comments: instructed no pillow under knee, avoid having knee of bed up ?Required Braces or Orthoses: Knee Immobilizer - Right ?Knee Immobilizer - Right: On when out of bed or walking ?Restrictions ?Weight Bearing Restrictions: No ?RLE Weight Bearing: Weight bearing as tolerated  ?  ? ?Mobility ? Bed Mobility ?Overal bed mobility: Needs Assistance ?Bed Mobility: Supine to Sit ?  ?  ?Supine to sit: Min assist ?  ?  ?General bed mobility comments: verbal cues for sequencing and self assist. incr pt effort with bed mobility ?  ? ?Transfers ?Overall transfer level: Needs assistance ?Equipment used: Rolling walker (2  wheels) ?Transfers: Sit to/from Stand, Bed to chair/wheelchair/BSC ?Sit to Stand: Min assist, +2 safety/equipment, +2 physical assistance ?  ?Step pivot transfers: Min assist, +2 safety/equipment ?  ?  ?  ?General transfer comment: mild dizziness on standing, BP  146/65. repetitious verbal cues for safety, RLE position and and placement. assist to steady with step pivot, cues to keep both hands on RW ?  ? ?Ambulation/Gait ?Ambulation/Gait assistance: Min assist, +2 safety/equipment ?Gait Distance (Feet): 38 Feet ?Assistive device: Rolling walker (2 wheels) ?Gait Pattern/deviations: Decreased step length - right, Decreased step length - left, Decreased stance time - right ?Gait velocity: decreased ?  ?  ?General Gait Details: multi-modal cues for sequence, safety, RW position. assist to steady and maneuver RW. fatigued after distance above requiring seated rest ? ? ?Stairs ?  ?  ?  ?  ?  ? ? ?Wheelchair Mobility ?  ? ?Modified Rankin (Stroke Patients Only) ?  ? ? ?  ?Balance   ?  ?  ?  ?  ?  ?  ?  ?  ?  ?  ?  ?  ?  ?  ?  ?  ?  ?  ?  ? ?  ?Cognition Arousal/Alertness: Awake/alert ?Behavior During Therapy: [facility] for tasks assessed/performed ?Overall Cognitive Status: Within Functional Limits for tasks assessed ?  ?  ?  ?  ?  ?  ?  ?  ?  ?  ?  ?  ?  ?  ?  ?  ?  ?  ?  ? ?  ?Exercises Total Joint Exercises ?Ankle Circles/Pumps: AROM, Both, 10 reps ? ?  ?General Comments   ?  ?  ? ?Pertinent Vitals/Pain Pain Assessment ?Pain Assessment: Faces ?  Faces Pain Scale: Hurts little more ?Pain Location: R knee ?Pain Descriptors / Indicators: Cramping, Discomfort, Operative site guarding ?Pain Intervention(s): Limited activity within patient's tolerance, Monitored during session, Repositioned, Ice applied  ? ? ?Home Living   ?  ?  ?  ?  ?  ?  ?  ?  ?  ?   ?  ?Prior Function    ?  ?  ?   ? ?PT Goals (current goals can now be found in the care plan section) Acute Rehab PT Goals ?Patient Stated Goal: To be able to walk without pain ?PT  Goal Formulation: With patient ?Time For Goal Achievement: 12/22/21 ?Potential to Achieve Goals: Good ?Progress towards PT goals: Progressing toward goals ? ?  ?Frequency ? ? ? 7X/week ? ? ? ?  ?PT Plan Current plan remains appropriate  ? ? ?Co-evaluation   ?  ?  ?  ?  ? ?  ?AM-PAC PT "6 Clicks" Mobility   ?Outcome Measure ? Help needed turning from your back to your side while in a flat bed without using bedrails?: A Lot ?Help needed moving from lying on your back to sitting on the side of a flat bed without using bedrails?: A Lot ?Help needed moving to and from a bed to a chair (including a wheelchair)?: A Little ?Help needed standing up from a chair using your arms (e.g., wheelchair or bedside chair)?: A Little ?Help needed to walk in hospital room?: A Little ?Help needed climbing 3-5 steps with a railing? : A Lot ?6 Click Score: 15 ? ?  ?End of Session Equipment Utilized During Treatment: Gait belt;Right knee immobilizer ?Activity Tolerance: Patient tolerated treatment well;Patient limited by fatigue ?Patient left: in chair;with call bell/phone within reach;with chair alarm set ?Nurse Communication: Mobility status ?PT Visit Diagnosis: Pain;Difficulty in walking, not elsewhere classified (R26.2) ?Pain - Right/Left: Right ?Pain - part of body: Knee ?  ? ? ?Time: 8921-1941 ?PT Time Calculation (min) (ACUTE ONLY): 24 min ? ?Charges:  $Gait Training: 8-22 mins ?$Therapeutic Activity: 8-22 mins          ?          ? Annette Saunders, PT ? ?Acute Rehab Dept [facility]) [phone] ?Pager [phone] ? ?12/18/2021 ? ? ? ?Annette Saunders ?12/18/2021, 1:18 PM ? ?

## 2021-12-19 LAB — BASIC METABOLIC PANEL
Anion gap: 6 (ref 5–15)
BUN: 22 mg/dL (ref 8–23)
CO2: 22 mmol/L (ref 22–32)
Calcium: 8.8 mg/dL — ABNORMAL LOW (ref 8.9–10.3)
Chloride: 110 mmol/L (ref 98–111)
Creatinine, Ser: 1.1 mg/dL — ABNORMAL HIGH (ref 0.44–1.00)
GFR, Estimated: 52 mL/min — ABNORMAL LOW (ref >60)
Glucose, Bld: 122 mg/dL — ABNORMAL HIGH (ref 70–99)
Potassium: 4.3 mmol/L (ref 3.5–5.1)
Sodium: 138 mmol/L (ref 135–145)

## 2021-12-19 LAB — UREA NITROGEN, URINE: Urea Nitrogen, Ur: 381 mg/dL

## 2021-12-19 LAB — GLUCOSE, CAPILLARY: Glucose-Capillary: 118 mg/dL — ABNORMAL HIGH (ref 70–99)

## 2021-12-19 NOTE — Progress Notes (Signed)
Physical Therapy Treatment ?Patient Details ?Name: Annette Saunders ?MRN: [id number] ?DOB: [birth year] ?Today's Date: 12/19/2021 ? ? ?History of Present Illness Pt is a 76yo female presenting s/p R-TKR on 12/15/21. PMH: OA, HTN, GERD, HLD, b/l knee replacement 2008. ? ?  ?PT Comments  ? ? POD # 4 am session ?Pt dressed and about to go home.  Performed stair training.  General stair comments: 75% VC's on proper sequencing and safety using B rails that she can reach.  Pt preferred to come doen stairs backward.  Tolerated well. General transfer comment: better today.  25% VC's on proper hand placement and safety with turns.  Also assisted with a toilet transfer.  Pt was able to self don/doff pants and perform own peri care.General Gait Details: tolerated a functional distance in room as well as to and from bathroom.  Reports knee pain 5/10 "not bad". Addressed all mobility questions, discussed appropriate activity, educated on use of ICE.  Pt ready for D/C to home. ?  ?Recommendations for follow up therapy are one component of a multi-disciplinary discharge planning process, led by the attending physician.  Recommendations may be updated based on patient status, additional functional criteria and insurance authorization. ? ?Follow Up Recommendations ? Follow physician's recommendations for discharge plan and follow up therapies ?  ?  ?Assistance Recommended at Discharge Intermittent Supervision/Assistance  ?Patient can return home with the following A little help with walking and/or transfers;A little help with bathing/dressing/bathroom;Assistance with cooking/housework;Assist for transportation;Help with stairs or ramp for entrance ?  ?Equipment Recommendations ? Rolling walker (2 wheels);BSC/3in1  ?  ?Recommendations for Other Services   ? ? ?  ?Precautions / Restrictions Precautions ?Precautions: Fall ?Precaution Comments: instructed no pillow under knee ?Restrictions ?Weight Bearing Restrictions: No ?RLE Weight Bearing:  Weight bearing as tolerated  ?  ? ?Mobility ? Bed Mobility ?  ?  ?  ?  ?  ?  ?  ?General bed mobility comments: OOB in recliner ?  ? ?Transfers ?  ?Equipment used: Rolling walker (2 wheels) ?Transfers: Sit to/from Stand, Bed to chair/wheelchair/BSC ?Sit to Stand: Supervision ?  ?Step pivot transfers: Supervision, Min guard ?  ?  ?  ?General transfer comment: better today.  25% VC's on proper hand placement and safety with turns.  Also assisted with a toilet transfer.  Pt was able to self don/doff pants and perform own peri care. ?  ? ?Ambulation/Gait ?Ambulation/Gait assistance: Supervision, Min guard ?Gait Distance (Feet): 28 Feet ?Assistive device: Rolling walker (2 wheels) ?Gait Pattern/deviations: Decreased step length - right, Decreased step length - left, Decreased stance time - right ?Gait velocity: decreased ?  ?  ?General Gait Details: tolerated a functional distance in room as well as to and from bathroom.  Reports knee pain 5/10 "not bad". ? ? ?Stairs ?Stairs: Yes ?Stairs assistance: Min guard, Min assist ?Stair Management: Two rails, Step to pattern, Forwards ?Number of Stairs: 3 ?General stair comments: 75% VC's on proper sequencing and safety using B rails that she can reach.  Pt preferred to come doen stairs backward.  Tolerated well. ? ? ?Wheelchair Mobility ?  ? ?Modified Rankin (Stroke Patients Only) ?  ? ? ?  ?Balance   ?  ?  ?  ?  ?  ?  ?  ?  ?  ?  ?  ?  ?  ?  ?  ?  ?  ?  ?  ? ?  ?Cognition Arousal/Alertness: Awake/alert ?Behavior During Therapy: [facility] for tasks assessed/performed ?  ?  ?  ?  ?  ?  ?  ?  ?  ?  ?  ?  ?  ?  ?  ?  ?  ?  General Comments: AxO x 3 pleasant originally from [facility] ?  ?  ? ?  ?Exercises   ? ?  ?General Comments   ?  ?  ? ?Pertinent Vitals/Pain Pain Assessment ?Pain Assessment: 0-10 ?Pain Score: 5  ?Pain Descriptors / Indicators: Cramping, Discomfort, Operative site guarding ?Pain Intervention(s): Monitored during session, Premedicated before session, Repositioned   ? ? ?Home Living   ?  ?  ?  ?  ?  ?  ?  ?  ?  ?   ?  ?Prior Function    ?  ?  ?   ? ?PT Goals (current goals can now be found in the care plan section) Progress towards PT goals: Progressing toward goals ? ?  ?Frequency ? ? ? 7X/week ? ? ? ?  ?PT Plan Current plan remains appropriate  ? ? ?Co-evaluation   ?  ?  ?  ?  ? ?  ?AM-PAC PT "6 Clicks" Mobility   ?Outcome Measure ? Help needed turning from your back to your side while in a flat bed without using bedrails?: A Little ?Help needed moving from lying on your back to sitting on the side of a flat bed without using bedrails?: A Little ?Help needed moving to and from a bed to a chair (including a wheelchair)?: A Little ?Help needed standing up from a chair using your arms (e.g., wheelchair or bedside chair)?: A Little ?Help needed to walk in hospital room?: A Little ?Help needed climbing 3-5 steps with a railing? : A Little ?6 Click Score: 18 ? ?  ?End of Session Equipment Utilized During Treatment: Gait belt ?Activity Tolerance: Patient tolerated treatment well ?Patient left: Other (comment) (in wheelcair) ?Nurse Communication: Mobility status ?PT Visit Diagnosis: Pain;Difficulty in walking, not elsewhere classified (R26.2) ?Pain - Right/Left: Right ?Pain - part of body: Knee ?  ? ? ?Time: 3235-5732 ?PT Time Calculation (min) (ACUTE ONLY): 28 min ? ?Charges:  $Gait Training: 8-22 mins ?$Therapeutic Activity: 8-22 mins          ?          ? ?Rica Koyanagi  PTA ?Acute  Rehabilitation Services ?Pager      [phone] ?Office      [phone] ? ? ?

## 2021-12-19 NOTE — TOC Transition Note (Addendum)
Transition of Care (TOC) - CM/SW Discharge Note ? ? ?Patient Details  ?Name: Annette Saunders ?MRN: [id number] ?Date of Birth: [birth year] ? ?Transition of Care (TOC) CM/SW Contact:  ?Ross Ludwig, LCSW ?Phone Number: ?12/19/2021, 11:37 AM ? ? ?Clinical Narrative:    ? ?CSW attempted to speak to patient to see if she was interested in home health services.  CSW was unable to get a hold of patient or her son, CSW left a message awaiting for call back.  Patient discharged back home, original plan is for outpatien PT but now wants [place] [facility] and OT.   ? ?CSW spoke to [place] at [facility], and they can accept her for home health services. ? ?  ?Barriers to Discharge: No Barriers Identified ? ? ?Patient Goals and CMS Choice ?Patient states their goals for this hospitalization and ongoing recovery are:: Discharge home ?CMS Medicare.gov Compare Post Acute Care list provided to:: Patient ?Choice offered to / list presented to : Patient ? ?Discharge Placement ? Home with home health PT and OT. ?           ?  ?  ?  ?  ? ?Discharge Plan and Services ?In-house Referral: Clinical Social Work ?  ?Post Acute Care Choice: Durable Medical Equipment          ?DME Arranged: [facility] ?DME Agency: [employer] ?Date DME Agency Contacted: 12/16/21 ?  ?Representative spoke with at DME Agency: Wells Guiles ?  ?  ?  ?  ?  ? ?Social Determinants of Health (SDOH) Interventions ?  ? ? ?Readmission Risk Interventions ?   ? View : No data to display.  ?  ?  ?  ? ? ? ? ? ?

## 2021-12-19 NOTE — Discharge Summary (Signed)
Physician Discharge Summary  ?Patient ID: ?Annette Saunders ?MRN: [id number] ?DOB/AGE: February 13, 1946 76 y.o. ? ?Admit date: 12/15/2021 ?Discharge date: 12/19/2021 ? ?Admission Diagnoses: Revision R TKA, AKI, hyperkalemia, CP, acute post-op anemia due to blood loss; hx of GERD, HTN, hypothyroidism, CKD, normocytic anemia, hyponatremia ? ?Discharge Diagnoses:  ?Principal Problem: ?  Failed total knee arthroplasty ([place]) ?Active Problems: ?  GERD (gastroesophageal reflux disease) ?  Essential hypertension, benign ?  Hypothyroidism, adult ?  Acute renal failure superimposed on stage 3a chronic kidney disease ([place]) ?  Hyperkalemia ?  Normocytic anemia ?  Hyponatremia ?  Grade I diastolic dysfunction ?  Acute postoperative anemia due to expected blood loss ?Same as above ? ?Discharged Condition: stable ? ?Hospital Course: Patient presented to [facility] OR on 12/15/21 for elective revision R TKA by Dr. Wynelle Link.  She tolerated the procedure well without complication.  She was then admitted to the hospital.  She was found to have elevated enzymes concerning to AKI.  Patient also noted CP post-operatively.  The hospitalist service was consulted to assist with management of this patient.  Patient's SCr stabilized and CP resolved.  She worked well with therapy.  She tolerated the remainder of her stay well without further complication.  She is to be D/C'd home on 12/19/21. ? ?Consults:  Hospitalist service ? ?Significant Diagnostic Studies: labs: BMet to monitor SCr. ? ?Treatments: IV hydration, antibiotics: Ancef, analgesia: acetaminophen and oxycodone,  anticoagulation: xarelto, and surgery: as stated above. ? ?Discharge Exam: ?Blood pressure (!) 154/68, pulse 89, temperature 98.2 ?F (36.8 ?C), temperature source Oral, resp. rate 13, height 5' (1.524 m), weight 99.8 kg, SpO2 97 %. ?General: WDWN patient in NAD. ?Psych:  Appropriate mood and affect. ?Neuro:  A&O x 3, Moving all extremities, sensation intact to light touch ?HEENT:  EOMs  intact ?Chest:  Even non-labored respirations ?Skin: Dressing C/D/I, no rashes or lesions ?Extremities: warm/dry, mild edema to R knee, no erythema or echymosis.  No lymphadenopathy. ?Pulses: Popliteus 2+ ?MSK:  ROM: lacks 5 degrees TKE, MMT: able to perform quad set, (-) Homan's  ? ?Disposition: Discharge disposition: 01-Home or Self Care ? ? ? ? ? ? ?Discharge Instructions   ? ? Call MD / Call 911   Complete by: As directed ?  ? If you experience chest pain or shortness of breath, CALL 911 and be transported to the hospital emergency room.  If you develope a fever above 101 F, pus (white drainage) or increased drainage or redness at the wound, or calf pain, call your surgeon's office.  ? Call MD / Call 911   Complete by: As directed ?  ? If you experience chest pain or shortness of breath, CALL 911 and be transported to the hospital emergency room.  If you develope a fever above 101 F, pus (white drainage) or increased drainage or redness at the wound, or calf pain, call your surgeon's office.  ? Change dressing   Complete by: As directed ?  ? You may remove the bulky bandage (ACE wrap and gauze) two days after surgery. You will have an adhesive waterproof bandage underneath. Leave this in place until your first follow-up appointment.  ? Constipation Prevention   Complete by: As directed ?  ? Drink plenty of fluids.  Prune juice may be helpful.  You may use a stool softener, such as Colace (over the counter) 100 mg twice a day.  Use MiraLax (over the counter) for constipation as needed.  ? Constipation Prevention   Complete  by: As directed ?  ? Drink plenty of fluids.  Prune juice may be helpful.  You may use a stool softener, such as Colace (over the counter) 100 mg twice a day.  Use MiraLax (over the counter) for constipation as needed.  ? Diet - low sodium heart healthy   Complete by: As directed ?  ? Do not put a pillow under the knee. Place it under the heel.   Complete by: As directed ?  ? Driving  restrictions   Complete by: As directed ?  ? No driving for two weeks  ? Increase activity slowly as tolerated   Complete by: As directed ?  ? Post-operative opioid taper instructions:   Complete by: As directed ?  ? POST-OPERATIVE OPIOID TAPER INSTRUCTIONS: ?It is important to wean off of your opioid medication as soon as possible. If you do not need pain medication after your surgery it is ok to stop day one. ?Opioids include: ?Codeine, Hydrocodone(Norco, Vicodin), Oxycodone(Percocet, oxycontin) and hydromorphone amongst others.  ?Long term and even short term use of opiods can cause: ?Increased pain response ?Dependence ?Constipation ?Depression ?Respiratory depression ?And more.  ?Withdrawal symptoms can include ?Flu like symptoms ?Nausea, vomiting ?And more ?Techniques to manage these symptoms ?Hydrate well ?Eat regular healthy meals ?Stay active ?Use relaxation techniques(deep breathing, meditating, yoga) ?Do Not substitute Alcohol to help with tapering ?If you have been on opioids for less than two weeks and do not have pain than it is ok to stop all together.  ?Plan to wean off of opioids ?This plan should start within one week post op of your joint replacement. ?Maintain the same interval or time between taking each dose and first decrease the dose.  ?Cut the total daily intake of opioids by one tablet each day ?Next start to increase the time between doses. ?The last dose that should be eliminated is the evening dose.  ? ?  ? Post-operative opioid taper instructions:   Complete by: As directed ?  ? POST-OPERATIVE OPIOID TAPER INSTRUCTIONS: ?It is important to wean off of your opioid medication as soon as possible. If you do not need pain medication after your surgery it is ok to stop day one. ?Opioids include: ?Codeine, Hydrocodone(Norco, Vicodin), Oxycodone(Percocet, oxycontin) and hydromorphone amongst others.  ?Long term and even short term use of opiods can cause: ?Increased pain  response ?Dependence ?Constipation ?Depression ?Respiratory depression ?And more.  ?Withdrawal symptoms can include ?Flu like symptoms ?Nausea, vomiting ?And more ?Techniques to manage these symptoms ?Hydrate well ?Eat regular healthy meals ?Stay active ?Use relaxation techniques(deep breathing, meditating, yoga) ?Do Not substitute Alcohol to help with tapering ?If you have been on opioids for less than two weeks and do not have pain than it is ok to stop all together.  ?Plan to wean off of opioids ?This plan should start within one week post op of your joint replacement. ?Maintain the same interval or time between taking each dose and first decrease the dose.  ?Cut the total daily intake of opioids by one tablet each day ?Next start to increase the time between doses. ?The last dose that should be eliminated is the evening dose.  ? ?  ? TED hose   Complete by: As directed ?  ? Use stockings (TED hose) for three weeks on both leg(s).  You may remove them at night for sleeping.  ? Weight bearing as tolerated   Complete by: As directed ?  ? Weight bearing as tolerated  Complete by: As directed ?  ? Laterality: right  ? Extremity: Lower  ? ?  ? ?Allergies as of 12/19/2021   ? ?   Reactions  ? Dilantin [phenytoin Sodium Extended] Other (See Comments)  ? Hallucinations if large amounts  ? Doxycycline Hyclate   ? Blurry vision   ? Lactose Intolerance (gi)   ? ?  ? ?  ?Medication List  ?  ? ?STOP taking these medications   ? ?meloxicam 15 MG tablet ?Commonly known as: MOBIC ?  ? ?  ? ?TAKE these medications   ? ?atorvastatin 20 MG tablet ?Commonly known as: LIPITOR ?Take 20 mg by mouth in the morning. ?  ?buPROPion 150 MG 12 hr tablet ?Commonly known as: ZYBAN ?Take 150 mg by mouth 2 (two) times daily. Pt started on 12/07/21 ?  ?dicyclomine 10 MG capsule ?Commonly known as: BENTYL ?TAKE 1 CAPSULE(10 MG) BY MOUTH TWICE DAILY AS NEEDED ?  ?escitalopram 10 MG tablet ?Commonly known as: LEXAPRO ?TAKE 1 TABLET BY MOUTH EVERY  DAY ?What changed: when to take this ?  ?esomeprazole 40 MG capsule ?Commonly known as: Foster ?Take 1 capsule (40 mg total) by mouth 2 (two) times daily before a meal. ?  ?famotidine 20 MG tablet ?Commonly known as: PEPCID ?TAKE 1 TABLET(20 MG

## 2021-12-19 NOTE — Progress Notes (Signed)
Subjective: ?4 Days Post-Op Procedure(s) (LRB): ?TOTAL KNEE REVISION (Right) ? ?Patient reports pain as mild to moderate.  States that she feels better this morning and is ready to go home.  Denies fever, chills, N/V, CP, SOB.  Tolerating POs well.  Admits to flatus.  Notes that she has worked well with therapy. ? ?Objective:  ? ?VITALS:  Temp:  [98.2 ?F (36.8 ?C)-98.9 ?F (37.2 ?C)] 98.2 ?F (36.8 ?C) (04/23 0741) ?Pulse Rate:  [81-89] 89 (04/22 2005) ?Resp:  [8-19] 13 (04/23 0800) ?BP: (95-154)/(53-68) 154/68 (04/23 0800) ?SpO2:  [95 %-100 %] 97 % (04/22 2005) ? ?General: WDWN patient in NAD. ?Psych:  Appropriate mood and affect. ?Neuro:  A&O x 3, Moving all extremities, sensation intact to light touch ?HEENT:  EOMs intact ?Chest:  Even non-labored respirations ?Skin: Dressing C/D/I, no rashes or lesions ?Extremities: warm/dry, mild edema to R knee, no erythema or echymosis.  No lymphadenopathy. ?Pulses: Popliteus 2+ ?MSK:  ROM: lacks 5 degrees TKE, MMT: able to perform quad set, (-) Homan's  ? ? ?LABS ?Recent Labs  ?  12/17/21 ?3428 12/17/21 ?1251  ?HGB 8.7* 8.8*  ?WBC 10.7* 10.1  ?PLT 205 217  ? ?Recent Labs  ?  12/18/21 ?7681 12/19/21 ?0252  ?NA 137 138  ?K 4.4 4.3  ?CL 113* 110  ?CO2 22 22  ?BUN 35* 22  ?CREATININE 1.21* 1.10*  ?GLUCOSE 107* 122*  ? ?No results for input(s): LABPT, INR in the last 72 hours. ? ? ?Assessment/Plan: ?4 Days Post-Op Procedure(s) (LRB): ?TOTAL KNEE REVISION (Right) ? ?Patient seen in rounds for Dr. Wynelle Link ?Patient denies CP.  Renal function continues to improve. ?Per note yesterday as long as SCr is below 1.5 patient is stable to D/C.  SCr 1.10 this am. ?WBAT R LE ?DVT ppx: Xarelto ?Disp:  D/C home today. ?Plan for outpatient post-op visit with Dr. Wynelle Link. ? ? ?Mechele Claude PA-C ?[facility] ?Office:  [phone]  ?

## 2021-12-21 NOTE — Progress Notes (Signed)
?TeleHealth Visit:  ?This visit was completed with telemedicine (audio/video) technology. ?Annette Saunders has verbally consented to this TeleHealth visit. The patient is located at home, the provider is located at home. The participants in this visit include the listed provider and patient. The visit was conducted today via MyChart video. ? ?OBESITY ?Annette Saunders is here to discuss her progress with her obesity treatment plan along with follow-up of her obesity related diagnoses.  ? ?Today's visit was # 3 ?Starting weight: 220 lbs ?Starting date: 11/17/21 ?Weight at last in office visit: 217 lbs on 12/02/21 ?Total weight loss: 3 lbs at last in office visit on 12/02/21. ?Today's reported weight: 223 lbs due to fluid retention from surgery. ? ?Nutrition Plan: the Category 1 Plan.  ?Hunger is well controlled. Cravings are well controlled.  ?Current exercise:  PT starts today. ? ?Interim History: Annette Saunders is status post right total knee replacement on December 15, 2021.  She had to stay in the hospital for 5 days due to acute Saunders failure which has now resolved.  She is still carrying some fluid in her weight at home and is 223 pounds despite eating very little.  Her appetite is down currently and she is not able to adhere to plan well.  She starts physical therapy today.She notes she has not been taking her pain medication but she plans on taking a dose before physical therapy.  Her cousins are staying with her and cooking for her so she has to eat what ever they fix which may or may not be according to plan. ? ?Assessment/Plan:  ?1. Prediabetes ?Annette Saunders has a diagnosis of prediabetes based on her elevated HgA1c. ?She denies polyphagia. ?Medication(s): Not currently on metformin. ?Lab Results  ?Component Value Date  ? HGBA1C 6.1 (H) 11/17/2021  ? ?Lab Results  ?Component Value Date  ? INSULIN 18.2 11/17/2021  ? ? ?Plan: ?Consider starting metformin after she recovers from surgery. ? ?2.  Fluid retention  ?Annette Saunders is concerned  because her weight is up.  She notes that she is going to the bathroom very frequently.  She had acute Saunders failure while she was in the hospital which is now resolved. ? ?Plan: ?Educated that she is diuresing the fluid that builds up in her body over the course of her surgery and recovery.  I told her this fluid will likely all be gone within the next week or so. ? ?3. Obesity: Current BMI 42.97 ?Annette Saunders is currently in the action stage of change. As such, her goal is to continue with weight loss efforts.  ?She has agreed to the Category 1 Plan.  ? ?Exercise goals: Continue physical therapy as directed ? ?Behavioral modification strategies: increasing lean protein intake. ?3 like salads so I told her she may have a salad with 4 ounces of meat and skinny grilled dressing at lunch. ?We will provide the lunch options handout at her next office visit since she does not think she will be able to download it from a MyChart message. ? ?Annette Saunders has agreed to follow-up with our clinic in 2 weeks.  ? ?No orders of the defined types were placed in this encounter. ? ? ?Medications Discontinued During This Encounter  ?Medication Reason  ? buPROPion (ZYBAN) 150 MG 12 hr tablet Discontinued by provider  ?  ? ?No orders of the defined types were placed in this encounter. ?   ? ?Objective:  ? ?VITALS: Per patient if applicable, see vitals. ?GENERAL: Alert and in no acute distress. ?CARDIOPULMONARY: No increased  WOB. Speaking in clear sentences.  ?PSYCH: Pleasant and cooperative. Speech normal rate and rhythm. Affect is appropriate. Insight and judgement are appropriate. Attention is focused, linear, and appropriate.  ?NEURO: Oriented as arrived to appointment on time with no prompting.  ? ?Lab Results  ?Component Value Date  ? CREATININE 1.10 (H) 12/19/2021  ? BUN 22 12/19/2021  ? NA 138 12/19/2021  ? K 4.3 12/19/2021  ? CL 110 12/19/2021  ? CO2 22 12/19/2021  ? ?Lab Results  ?Component Value Date  ? ALT 17 12/17/2021  ? AST 42  (H) 12/17/2021  ? ALKPHOS 61 12/17/2021  ? BILITOT 0.8 12/17/2021  ? ?Lab Results  ?Component Value Date  ? HGBA1C 6.1 (H) 11/17/2021  ? ?Lab Results  ?Component Value Date  ? INSULIN 18.2 11/17/2021  ? ?Lab Results  ?Component Value Date  ? TSH 2.240 12/17/2021  ? ?Lab Results  ?Component Value Date  ? CHOL 187 11/17/2021  ? HDL 71 11/17/2021  ? Annette Saunders 86 11/17/2021  ? TRIG 182 (H) 11/17/2021  ? CHOLHDL 2.6 11/17/2021  ? ?Lab Results  ?Component Value Date  ? WBC 10.1 12/17/2021  ? HGB 8.8 (L) 12/17/2021  ? HCT 28.2 (L) 12/17/2021  ? MCV 91.9 12/17/2021  ? PLT 217 12/17/2021  ? ?No results found for: IRON, TIBC, FERRITIN ?Lab Results  ?Component Value Date  ? VD25OH 35.7 11/17/2021  ? VD25OH 51 06/09/2020  ? VD25OH 71 10/16/2019  ? ? ?Attestation Statements:  ? ?Reviewed by clinician on day of visit: allergies, medications, problem list, medical history, surgical history, family history, social history, and previous encounter notes. ? ?Time spent on visit including pre-visit chart review and post-visit charting and care was 31 minutes.  ? ? ?

## 2021-12-22 ENCOUNTER — Encounter (INDEPENDENT_AMBULATORY_CARE_PROVIDER_SITE_OTHER): Payer: Self-pay | Admitting: Family Medicine

## 2021-12-22 ENCOUNTER — Telehealth (INDEPENDENT_AMBULATORY_CARE_PROVIDER_SITE_OTHER): Payer: Medicare Other | Admitting: Family Medicine

## 2021-12-22 DIAGNOSIS — K219 Gastro-esophageal reflux disease without esophagitis: Secondary | ICD-10-CM | POA: Diagnosis not present

## 2021-12-22 DIAGNOSIS — E785 Hyperlipidemia, unspecified: Secondary | ICD-10-CM | POA: Diagnosis not present

## 2021-12-22 DIAGNOSIS — Z6841 Body Mass Index (BMI) 40.0 and over, adult: Secondary | ICD-10-CM | POA: Diagnosis not present

## 2021-12-22 DIAGNOSIS — E669 Obesity, unspecified: Secondary | ICD-10-CM

## 2021-12-22 DIAGNOSIS — R7303 Prediabetes: Secondary | ICD-10-CM | POA: Diagnosis not present

## 2021-12-22 DIAGNOSIS — R609 Edema, unspecified: Secondary | ICD-10-CM

## 2021-12-22 DIAGNOSIS — D62 Acute posthemorrhagic anemia: Secondary | ICD-10-CM | POA: Diagnosis not present

## 2021-12-22 DIAGNOSIS — N1831 Chronic kidney disease, stage 3a: Secondary | ICD-10-CM | POA: Diagnosis not present

## 2021-12-22 DIAGNOSIS — D631 Anemia in chronic kidney disease: Secondary | ICD-10-CM | POA: Diagnosis not present

## 2021-12-22 DIAGNOSIS — E875 Hyperkalemia: Secondary | ICD-10-CM | POA: Diagnosis not present

## 2021-12-22 DIAGNOSIS — Z7901 Long term (current) use of anticoagulants: Secondary | ICD-10-CM | POA: Diagnosis not present

## 2021-12-22 DIAGNOSIS — Z20822 Contact with and (suspected) exposure to covid-19: Secondary | ICD-10-CM | POA: Diagnosis not present

## 2021-12-22 DIAGNOSIS — T84012D Broken internal right knee prosthesis, subsequent encounter: Secondary | ICD-10-CM | POA: Diagnosis not present

## 2021-12-22 DIAGNOSIS — I1310 Hypertensive heart and chronic kidney disease without heart failure, with stage 1 through stage 4 chronic kidney disease, or unspecified chronic kidney disease: Secondary | ICD-10-CM | POA: Diagnosis not present

## 2021-12-22 DIAGNOSIS — E871 Hypo-osmolality and hyponatremia: Secondary | ICD-10-CM | POA: Diagnosis not present

## 2021-12-22 DIAGNOSIS — E039 Hypothyroidism, unspecified: Secondary | ICD-10-CM | POA: Diagnosis not present

## 2021-12-22 DIAGNOSIS — Z9181 History of falling: Secondary | ICD-10-CM | POA: Diagnosis not present

## 2021-12-23 ENCOUNTER — Other Ambulatory Visit (HOSPITAL_COMMUNITY): Payer: Self-pay

## 2021-12-24 DIAGNOSIS — D631 Anemia in chronic kidney disease: Secondary | ICD-10-CM | POA: Diagnosis not present

## 2021-12-24 DIAGNOSIS — N1831 Chronic kidney disease, stage 3a: Secondary | ICD-10-CM | POA: Diagnosis not present

## 2021-12-24 DIAGNOSIS — D62 Acute posthemorrhagic anemia: Secondary | ICD-10-CM | POA: Diagnosis not present

## 2021-12-24 DIAGNOSIS — I1310 Hypertensive heart and chronic kidney disease without heart failure, with stage 1 through stage 4 chronic kidney disease, or unspecified chronic kidney disease: Secondary | ICD-10-CM | POA: Diagnosis not present

## 2021-12-24 DIAGNOSIS — T84012D Broken internal right knee prosthesis, subsequent encounter: Secondary | ICD-10-CM | POA: Diagnosis not present

## 2021-12-24 DIAGNOSIS — K219 Gastro-esophageal reflux disease without esophagitis: Secondary | ICD-10-CM | POA: Diagnosis not present

## 2021-12-27 DIAGNOSIS — N1831 Chronic kidney disease, stage 3a: Secondary | ICD-10-CM | POA: Diagnosis not present

## 2021-12-27 DIAGNOSIS — I1310 Hypertensive heart and chronic kidney disease without heart failure, with stage 1 through stage 4 chronic kidney disease, or unspecified chronic kidney disease: Secondary | ICD-10-CM | POA: Diagnosis not present

## 2021-12-27 DIAGNOSIS — T84012D Broken internal right knee prosthesis, subsequent encounter: Secondary | ICD-10-CM | POA: Diagnosis not present

## 2021-12-27 DIAGNOSIS — D631 Anemia in chronic kidney disease: Secondary | ICD-10-CM | POA: Diagnosis not present

## 2021-12-27 DIAGNOSIS — D62 Acute posthemorrhagic anemia: Secondary | ICD-10-CM | POA: Diagnosis not present

## 2021-12-27 DIAGNOSIS — K219 Gastro-esophageal reflux disease without esophagitis: Secondary | ICD-10-CM | POA: Diagnosis not present

## 2021-12-29 DIAGNOSIS — N1831 Chronic kidney disease, stage 3a: Secondary | ICD-10-CM | POA: Diagnosis not present

## 2021-12-29 DIAGNOSIS — D631 Anemia in chronic kidney disease: Secondary | ICD-10-CM | POA: Diagnosis not present

## 2021-12-29 DIAGNOSIS — I1310 Hypertensive heart and chronic kidney disease without heart failure, with stage 1 through stage 4 chronic kidney disease, or unspecified chronic kidney disease: Secondary | ICD-10-CM | POA: Diagnosis not present

## 2021-12-29 DIAGNOSIS — T84012D Broken internal right knee prosthesis, subsequent encounter: Secondary | ICD-10-CM | POA: Diagnosis not present

## 2021-12-29 DIAGNOSIS — K219 Gastro-esophageal reflux disease without esophagitis: Secondary | ICD-10-CM | POA: Diagnosis not present

## 2021-12-29 DIAGNOSIS — D62 Acute posthemorrhagic anemia: Secondary | ICD-10-CM | POA: Diagnosis not present

## 2021-12-31 DIAGNOSIS — N390 Urinary tract infection, site not specified: Secondary | ICD-10-CM | POA: Diagnosis not present

## 2022-01-03 DIAGNOSIS — Z20822 Contact with and (suspected) exposure to covid-19: Secondary | ICD-10-CM | POA: Diagnosis not present

## 2022-01-06 DIAGNOSIS — D631 Anemia in chronic kidney disease: Secondary | ICD-10-CM | POA: Diagnosis not present

## 2022-01-06 DIAGNOSIS — N1831 Chronic kidney disease, stage 3a: Secondary | ICD-10-CM | POA: Diagnosis not present

## 2022-01-06 DIAGNOSIS — T84012D Broken internal right knee prosthesis, subsequent encounter: Secondary | ICD-10-CM | POA: Diagnosis not present

## 2022-01-06 DIAGNOSIS — K219 Gastro-esophageal reflux disease without esophagitis: Secondary | ICD-10-CM | POA: Diagnosis not present

## 2022-01-06 DIAGNOSIS — D62 Acute posthemorrhagic anemia: Secondary | ICD-10-CM | POA: Diagnosis not present

## 2022-01-06 DIAGNOSIS — I1310 Hypertensive heart and chronic kidney disease without heart failure, with stage 1 through stage 4 chronic kidney disease, or unspecified chronic kidney disease: Secondary | ICD-10-CM | POA: Diagnosis not present

## 2022-01-10 DIAGNOSIS — I1310 Hypertensive heart and chronic kidney disease without heart failure, with stage 1 through stage 4 chronic kidney disease, or unspecified chronic kidney disease: Secondary | ICD-10-CM | POA: Diagnosis not present

## 2022-01-10 DIAGNOSIS — K219 Gastro-esophageal reflux disease without esophagitis: Secondary | ICD-10-CM | POA: Diagnosis not present

## 2022-01-10 DIAGNOSIS — D631 Anemia in chronic kidney disease: Secondary | ICD-10-CM | POA: Diagnosis not present

## 2022-01-10 DIAGNOSIS — T84012D Broken internal right knee prosthesis, subsequent encounter: Secondary | ICD-10-CM | POA: Diagnosis not present

## 2022-01-10 DIAGNOSIS — D62 Acute posthemorrhagic anemia: Secondary | ICD-10-CM | POA: Diagnosis not present

## 2022-01-10 DIAGNOSIS — N1831 Chronic kidney disease, stage 3a: Secondary | ICD-10-CM | POA: Diagnosis not present

## 2022-01-13 ENCOUNTER — Ambulatory Visit (INDEPENDENT_AMBULATORY_CARE_PROVIDER_SITE_OTHER): Payer: Medicare Other | Admitting: Family Medicine

## 2022-01-17 DIAGNOSIS — D631 Anemia in chronic kidney disease: Secondary | ICD-10-CM | POA: Diagnosis not present

## 2022-01-17 DIAGNOSIS — T84012D Broken internal right knee prosthesis, subsequent encounter: Secondary | ICD-10-CM | POA: Diagnosis not present

## 2022-01-17 DIAGNOSIS — I1310 Hypertensive heart and chronic kidney disease without heart failure, with stage 1 through stage 4 chronic kidney disease, or unspecified chronic kidney disease: Secondary | ICD-10-CM | POA: Diagnosis not present

## 2022-01-17 DIAGNOSIS — D62 Acute posthemorrhagic anemia: Secondary | ICD-10-CM | POA: Diagnosis not present

## 2022-01-17 DIAGNOSIS — N1831 Chronic kidney disease, stage 3a: Secondary | ICD-10-CM | POA: Diagnosis not present

## 2022-01-17 DIAGNOSIS — K219 Gastro-esophageal reflux disease without esophagitis: Secondary | ICD-10-CM | POA: Diagnosis not present

## 2022-01-21 DIAGNOSIS — E785 Hyperlipidemia, unspecified: Secondary | ICD-10-CM | POA: Diagnosis not present

## 2022-01-21 DIAGNOSIS — E039 Hypothyroidism, unspecified: Secondary | ICD-10-CM | POA: Diagnosis not present

## 2022-01-21 DIAGNOSIS — D631 Anemia in chronic kidney disease: Secondary | ICD-10-CM | POA: Diagnosis not present

## 2022-01-21 DIAGNOSIS — D62 Acute posthemorrhagic anemia: Secondary | ICD-10-CM | POA: Diagnosis not present

## 2022-01-21 DIAGNOSIS — E875 Hyperkalemia: Secondary | ICD-10-CM | POA: Diagnosis not present

## 2022-01-21 DIAGNOSIS — I1310 Hypertensive heart and chronic kidney disease without heart failure, with stage 1 through stage 4 chronic kidney disease, or unspecified chronic kidney disease: Secondary | ICD-10-CM | POA: Diagnosis not present

## 2022-01-21 DIAGNOSIS — K219 Gastro-esophageal reflux disease without esophagitis: Secondary | ICD-10-CM | POA: Diagnosis not present

## 2022-01-21 DIAGNOSIS — T84012D Broken internal right knee prosthesis, subsequent encounter: Secondary | ICD-10-CM | POA: Diagnosis not present

## 2022-01-21 DIAGNOSIS — Z7901 Long term (current) use of anticoagulants: Secondary | ICD-10-CM | POA: Diagnosis not present

## 2022-01-21 DIAGNOSIS — Z9181 History of falling: Secondary | ICD-10-CM | POA: Diagnosis not present

## 2022-01-21 DIAGNOSIS — N1831 Chronic kidney disease, stage 3a: Secondary | ICD-10-CM | POA: Diagnosis not present

## 2022-01-21 DIAGNOSIS — E871 Hypo-osmolality and hyponatremia: Secondary | ICD-10-CM | POA: Diagnosis not present

## 2022-01-26 ENCOUNTER — Ambulatory Visit (INDEPENDENT_AMBULATORY_CARE_PROVIDER_SITE_OTHER): Payer: Medicare Other | Admitting: Family Medicine

## 2022-01-26 DIAGNOSIS — D631 Anemia in chronic kidney disease: Secondary | ICD-10-CM | POA: Diagnosis not present

## 2022-01-26 DIAGNOSIS — K219 Gastro-esophageal reflux disease without esophagitis: Secondary | ICD-10-CM | POA: Diagnosis not present

## 2022-01-26 DIAGNOSIS — N1831 Chronic kidney disease, stage 3a: Secondary | ICD-10-CM | POA: Diagnosis not present

## 2022-01-26 DIAGNOSIS — T84012D Broken internal right knee prosthesis, subsequent encounter: Secondary | ICD-10-CM | POA: Diagnosis not present

## 2022-01-26 DIAGNOSIS — I1310 Hypertensive heart and chronic kidney disease without heart failure, with stage 1 through stage 4 chronic kidney disease, or unspecified chronic kidney disease: Secondary | ICD-10-CM | POA: Diagnosis not present

## 2022-01-26 DIAGNOSIS — D62 Acute posthemorrhagic anemia: Secondary | ICD-10-CM | POA: Diagnosis not present

## 2022-01-28 DIAGNOSIS — Z5189 Encounter for other specified aftercare: Secondary | ICD-10-CM | POA: Diagnosis not present

## 2022-01-31 DIAGNOSIS — N1831 Chronic kidney disease, stage 3a: Secondary | ICD-10-CM | POA: Diagnosis not present

## 2022-01-31 DIAGNOSIS — D631 Anemia in chronic kidney disease: Secondary | ICD-10-CM | POA: Diagnosis not present

## 2022-01-31 DIAGNOSIS — D62 Acute posthemorrhagic anemia: Secondary | ICD-10-CM | POA: Diagnosis not present

## 2022-01-31 DIAGNOSIS — T84012D Broken internal right knee prosthesis, subsequent encounter: Secondary | ICD-10-CM | POA: Diagnosis not present

## 2022-01-31 DIAGNOSIS — K219 Gastro-esophageal reflux disease without esophagitis: Secondary | ICD-10-CM | POA: Diagnosis not present

## 2022-01-31 DIAGNOSIS — I1310 Hypertensive heart and chronic kidney disease without heart failure, with stage 1 through stage 4 chronic kidney disease, or unspecified chronic kidney disease: Secondary | ICD-10-CM | POA: Diagnosis not present

## 2022-02-07 ENCOUNTER — Ambulatory Visit (INDEPENDENT_AMBULATORY_CARE_PROVIDER_SITE_OTHER): Payer: Medicare Other | Admitting: Family Medicine

## 2022-02-07 DIAGNOSIS — D631 Anemia in chronic kidney disease: Secondary | ICD-10-CM | POA: Diagnosis not present

## 2022-02-07 DIAGNOSIS — K219 Gastro-esophageal reflux disease without esophagitis: Secondary | ICD-10-CM | POA: Diagnosis not present

## 2022-02-07 DIAGNOSIS — N1831 Chronic kidney disease, stage 3a: Secondary | ICD-10-CM | POA: Diagnosis not present

## 2022-02-07 DIAGNOSIS — D62 Acute posthemorrhagic anemia: Secondary | ICD-10-CM | POA: Diagnosis not present

## 2022-02-07 DIAGNOSIS — I1310 Hypertensive heart and chronic kidney disease without heart failure, with stage 1 through stage 4 chronic kidney disease, or unspecified chronic kidney disease: Secondary | ICD-10-CM | POA: Diagnosis not present

## 2022-02-07 DIAGNOSIS — T84012D Broken internal right knee prosthesis, subsequent encounter: Secondary | ICD-10-CM | POA: Diagnosis not present

## 2022-02-10 ENCOUNTER — Encounter (INDEPENDENT_AMBULATORY_CARE_PROVIDER_SITE_OTHER): Payer: Self-pay | Admitting: Family Medicine

## 2022-02-10 ENCOUNTER — Ambulatory Visit (INDEPENDENT_AMBULATORY_CARE_PROVIDER_SITE_OTHER): Payer: Medicare Other | Admitting: Family Medicine

## 2022-02-10 VITALS — BP 124/76 | HR 82 | Temp 97.6°F | Ht 60.0 in | Wt 206.0 lb

## 2022-02-10 DIAGNOSIS — R7303 Prediabetes: Secondary | ICD-10-CM | POA: Diagnosis not present

## 2022-02-10 DIAGNOSIS — E669 Obesity, unspecified: Secondary | ICD-10-CM | POA: Diagnosis not present

## 2022-02-10 DIAGNOSIS — Z6841 Body Mass Index (BMI) 40.0 and over, adult: Secondary | ICD-10-CM | POA: Diagnosis not present

## 2022-02-11 DIAGNOSIS — I1310 Hypertensive heart and chronic kidney disease without heart failure, with stage 1 through stage 4 chronic kidney disease, or unspecified chronic kidney disease: Secondary | ICD-10-CM | POA: Diagnosis not present

## 2022-02-11 DIAGNOSIS — K219 Gastro-esophageal reflux disease without esophagitis: Secondary | ICD-10-CM | POA: Diagnosis not present

## 2022-02-11 DIAGNOSIS — N1831 Chronic kidney disease, stage 3a: Secondary | ICD-10-CM | POA: Diagnosis not present

## 2022-02-11 DIAGNOSIS — T84012D Broken internal right knee prosthesis, subsequent encounter: Secondary | ICD-10-CM | POA: Diagnosis not present

## 2022-02-11 DIAGNOSIS — D62 Acute posthemorrhagic anemia: Secondary | ICD-10-CM | POA: Diagnosis not present

## 2022-02-11 DIAGNOSIS — D631 Anemia in chronic kidney disease: Secondary | ICD-10-CM | POA: Diagnosis not present

## 2022-02-14 DIAGNOSIS — D631 Anemia in chronic kidney disease: Secondary | ICD-10-CM | POA: Diagnosis not present

## 2022-02-14 DIAGNOSIS — T84012D Broken internal right knee prosthesis, subsequent encounter: Secondary | ICD-10-CM | POA: Diagnosis not present

## 2022-02-14 DIAGNOSIS — I1310 Hypertensive heart and chronic kidney disease without heart failure, with stage 1 through stage 4 chronic kidney disease, or unspecified chronic kidney disease: Secondary | ICD-10-CM | POA: Diagnosis not present

## 2022-02-14 DIAGNOSIS — D62 Acute posthemorrhagic anemia: Secondary | ICD-10-CM | POA: Diagnosis not present

## 2022-02-14 DIAGNOSIS — K219 Gastro-esophageal reflux disease without esophagitis: Secondary | ICD-10-CM | POA: Diagnosis not present

## 2022-02-14 DIAGNOSIS — N1831 Chronic kidney disease, stage 3a: Secondary | ICD-10-CM | POA: Diagnosis not present

## 2022-02-16 DIAGNOSIS — K219 Gastro-esophageal reflux disease without esophagitis: Secondary | ICD-10-CM | POA: Diagnosis not present

## 2022-02-16 DIAGNOSIS — D62 Acute posthemorrhagic anemia: Secondary | ICD-10-CM | POA: Diagnosis not present

## 2022-02-16 DIAGNOSIS — D631 Anemia in chronic kidney disease: Secondary | ICD-10-CM | POA: Diagnosis not present

## 2022-02-16 DIAGNOSIS — I1310 Hypertensive heart and chronic kidney disease without heart failure, with stage 1 through stage 4 chronic kidney disease, or unspecified chronic kidney disease: Secondary | ICD-10-CM | POA: Diagnosis not present

## 2022-02-16 DIAGNOSIS — T84012D Broken internal right knee prosthesis, subsequent encounter: Secondary | ICD-10-CM | POA: Diagnosis not present

## 2022-02-16 DIAGNOSIS — N1831 Chronic kidney disease, stage 3a: Secondary | ICD-10-CM | POA: Diagnosis not present

## 2022-03-02 DIAGNOSIS — E669 Obesity, unspecified: Secondary | ICD-10-CM | POA: Diagnosis not present

## 2022-03-02 DIAGNOSIS — M25561 Pain in right knee: Secondary | ICD-10-CM | POA: Diagnosis not present

## 2022-03-02 DIAGNOSIS — F5104 Psychophysiologic insomnia: Secondary | ICD-10-CM | POA: Diagnosis not present

## 2022-03-02 DIAGNOSIS — Z6839 Body mass index (BMI) 39.0-39.9, adult: Secondary | ICD-10-CM | POA: Diagnosis not present

## 2022-03-02 NOTE — Progress Notes (Signed)
TeleHealth Visit:  This visit was completed with telemedicine (audio/video) technology. Annette Saunders has verbally consented to this TeleHealth visit. The patient is located at home, the provider is located at home. The participants in this visit include the listed provider and patient. The visit was conducted today via MyChart video.  OBESITY Annette Saunders is here to discuss her progress with her obesity treatment plan along with follow-up of her obesity related diagnoses.   Today's visit was # 5 Starting weight: 220 lbs Starting date: 11/17/2021 Weight at last in office visit: 206 lbs on 02/10/22 Total weight loss: 14 lbs at last in office visit on 02/10/22. Today's reported weight:  No weight reported.  Nutrition Plan: the Category 1 Plan with breakfast and lunch options-not adhering well. Hunger is poorly controlled. Cravings are poorly controlled.  Current exercise: riding stationary bike for 20 minutes 3-4 days  per week.  Interim History: Annette Saunders reports she is really struggling with staying on plan.  She tends to have no hunger in the morning says she does not eat till about 2 pm and then she feels like she eats the rest of the day. She is having a lot of pain in her knee and has an appointment with Dr. Maureen Ralphs on July 18.  She is status post total knee replacement on April 19.  She is planning to go to [place] to see her new grandson in a few weeks but is unsure if she can go because of her knee issue.  Assessment/Plan:  1. Prediabetes Annette Saunders has a diagnosis of prediabetes based on her elevated HgA1c (6.1) She reports polyphagia. Medication(s): None Lab Results  Component Value Date   HGBA1C 6.1 (H) 11/17/2021   Lab Results  Component Value Date   INSULIN 18.2 11/17/2021    Plan: New prescription Ozempic 0.25 mg weekly Annette Saunders denies personal or family history of thyroid cancer, history of pancreatitis, or current cholelithiasis. Annette Saunders was informed of the most  common side effects (nausea, constipation, diarrhea).  2..GERD Has history of hiatal hernia repair.  She takes Pepcid in the morning and Nexium in the evening but still has GERD symptoms occasionally.  She sees gastroenterology.  Plan: Continue Pepcid and Nexium as prescribed by GI. Avoid trigger foods, especially within 3 hours of bedtime (acid foods such as tomatoes/tomato sauce, caffeine, chocolate, spicy foods). We will continue to monitor in conjunction with GLP-1 therapy.  3. Eating disorder/emotional eating Annette Saunders has had issues with stress/emotional eating recently. This is likely due to knee pain and disturbance in normal daily routines due to immobility and knee pain. Currently this is poorly controlled. Overall mood is stable.  Medication(s): Lexapro 10 mg daily.  Plan: Continue Lexapro 10 mg daily. She is interested in seeing Dr. Mallie Mussel but prefers to wait till after she goes to [place]. Discussed strategies to reduce emotional eating.   4. Obesity: Current BMI 40.23 Annette Saunders is currently in the action stage of change. As such, her goal is to continue with weight loss efforts.  She has agreed to the Category 1 Plan-with breakfast and lunch options.  Exercise goals: as is  Behavioral modification strategies: increasing lean protein intake, decreasing simple carbohydrates, no skipping meals, and emotional eating strategies.  Annette Saunders has agreed to follow-up with our clinic in 2 weeks.   No orders of the defined types were placed in this encounter.   Medications Discontinued During This Encounter  Medication Reason   rivaroxaban (XARELTO) 10 MG TABS tablet Completed Course  Meds ordered this encounter  Medications   Semaglutide,0.25 or 0.'5MG'$ /DOS, (OZEMPIC, 0.25 OR 0.5 MG/DOSE,) 2 MG/3ML SOPN    Sig: Inject 0.25 mg into the skin once a week.    Dispense:  3 mL    Refill:  0    Order Specific Question:   Supervising Provider    Answer:   Dennard Nip D  [[id number]]      Objective:   VITALS: Per patient if applicable, see vitals. GENERAL: Alert and in no acute distress. CARDIOPULMONARY: No increased WOB. Speaking in clear sentences.  PSYCH: Pleasant and cooperative. Speech normal rate and rhythm. Affect is appropriate. Insight and judgement are appropriate. Attention is focused, linear, and appropriate.  NEURO: Oriented as arrived to appointment on time with no prompting.   Lab Results  Component Value Date   CREATININE 1.10 (H) 12/19/2021   BUN 22 12/19/2021   NA 138 12/19/2021   K 4.3 12/19/2021   CL 110 12/19/2021   CO2 22 12/19/2021   Lab Results  Component Value Date   ALT 17 12/17/2021   AST 42 (H) 12/17/2021   ALKPHOS 61 12/17/2021   BILITOT 0.8 12/17/2021   Lab Results  Component Value Date   HGBA1C 6.1 (H) 11/17/2021   Lab Results  Component Value Date   INSULIN 18.2 11/17/2021   Lab Results  Component Value Date   TSH 2.240 12/17/2021   Lab Results  Component Value Date   CHOL 187 11/17/2021   HDL 71 11/17/2021   LDLCALC 86 11/17/2021   TRIG 182 (H) 11/17/2021   CHOLHDL 2.6 11/17/2021   Lab Results  Component Value Date   WBC 10.1 12/17/2021   HGB 8.8 (L) 12/17/2021   HCT 28.2 (L) 12/17/2021   MCV 91.9 12/17/2021   PLT 217 12/17/2021   No results found for: "IRON", "TIBC", "FERRITIN" Lab Results  Component Value Date   VD25OH 35.7 11/17/2021   VD25OH 51 06/09/2020   VD25OH 71 10/16/2019    Attestation Statements:   Reviewed by clinician on day of visit: allergies, medications, problem list, medical history, surgical history, family history, social history, and previous encounter notes.

## 2022-03-07 ENCOUNTER — Telehealth (INDEPENDENT_AMBULATORY_CARE_PROVIDER_SITE_OTHER): Payer: Medicare Other | Admitting: Family Medicine

## 2022-03-07 ENCOUNTER — Encounter (INDEPENDENT_AMBULATORY_CARE_PROVIDER_SITE_OTHER): Payer: Self-pay | Admitting: Family Medicine

## 2022-03-07 DIAGNOSIS — Z6841 Body Mass Index (BMI) 40.0 and over, adult: Secondary | ICD-10-CM | POA: Diagnosis not present

## 2022-03-07 DIAGNOSIS — F509 Eating disorder, unspecified: Secondary | ICD-10-CM

## 2022-03-07 DIAGNOSIS — K219 Gastro-esophageal reflux disease without esophagitis: Secondary | ICD-10-CM | POA: Diagnosis not present

## 2022-03-07 DIAGNOSIS — R7303 Prediabetes: Secondary | ICD-10-CM | POA: Diagnosis not present

## 2022-03-07 DIAGNOSIS — E669 Obesity, unspecified: Secondary | ICD-10-CM | POA: Diagnosis not present

## 2022-03-07 MED ORDER — OZEMPIC (0.25 OR 0.5 MG/DOSE) 2 MG/3ML ~~LOC~~ SOPN: 0.2500 mg | PEN_INJECTOR | SUBCUTANEOUS | 0 refills | Status: DC

## 2022-03-08 ENCOUNTER — Encounter (INDEPENDENT_AMBULATORY_CARE_PROVIDER_SITE_OTHER): Payer: Self-pay

## 2022-03-10 ENCOUNTER — Ambulatory Visit (INDEPENDENT_AMBULATORY_CARE_PROVIDER_SITE_OTHER): Payer: Medicare Other | Admitting: Family Medicine

## 2022-03-14 ENCOUNTER — Telehealth (INDEPENDENT_AMBULATORY_CARE_PROVIDER_SITE_OTHER): Payer: Self-pay | Admitting: Family Medicine

## 2022-03-14 NOTE — Telephone Encounter (Signed)
[place] - Patient received my mychart message in reference to prescription drug card. Patient does not have one, patient has a secondary [employer] which covers her medication patient does not need prior authorization for Ozempic. Patient has been getting medication without prior authorization. Patient sated that medication cost is expensive and she can't keep paying that amount. Patient given number for Ozempic customer service to see if she can get patient assistance.

## 2022-03-17 NOTE — Progress Notes (Signed)
TeleHealth Visit:  This visit was completed with telemedicine (audio/video) technology. Annette Saunders has verbally consented to this TeleHealth visit. The patient is located at home, the provider is located at home. The participants in this visit include the listed provider and patient. The visit was conducted today via MyChart video.  OBESITY Annette Saunders is here to discuss her progress with her obesity treatment plan along with follow-up of her obesity related diagnoses.   Today's visit was # 6 Starting weight: 220 lbs Starting date: 11/17/2021 Weight at last in office visit: 206 lbs on 02/10/22 Total weight loss: 14 lbs at last in office visit on 02/10/22. Today's reported weight: 197 lbs   Nutrition Plan: the Category 1 Plan with breakfast and lunch options- 100% adherence Hunger is well controlled.  Current exercise: riding stationary bike for 20 minutes 3-4 days  per week.  Interim History: Annette Saunders is doing very well with plan adherence.  She reports a weight of 197 pounds which reflects a 9 pound weight loss since her last in office visit on June 15.  The Ozempic is helping with both hunger and cravings. She is doing a good job with exercise-riding stationary bike at home.  She is status post total knee replacement April 19 and still having some knee pain.  Has appoint with Dr. Wynelle Link on August 18. She plans on going to visit her son and his family including her new grandson in [place] the end of August.  Assessment/Plan:  1. Insomnia Annette Saunders admits to difficulty falling asleep.  She sometimes cannot get to sleep till 2 AM.  She was prescribed daridorexant by her PCP which she reports is not helping.  However she says that 3 melatonin tablets does help.  Plan: Encouraged her to make follow-up appointment with PCP to discuss other options.  2. Prediabetes Last A1c elevated at 6.1 on 11/17/2021. Medication(s): Ozempic 0.25 mg weekly.  She has taken 2 doses so far.  Providing  good appetite suppression.  She is tolerating it well. She is unable to afford the Ozempic-cost her $400 per month.  Information provided previously by our office for the patient assistance program.  Annette Saunders called and is awaiting the packet. Lab Results  Component Value Date   HGBA1C 6.1 (H) 11/17/2021   Lab Results  Component Value Date   INSULIN 18.2 11/17/2021    Plan: She will have [place] print the PAP application on August 2 if she has not received it yet. Encouraged her to call [facility] to make sure the packet was mailed out. Continue Ozempic 0.25 mg weekly-I advised her that she should have 6 doses left in the pen.  3. Obesity: Current BMI 40.2 Annette Saunders is currently in the action stage of change. As such, her goal is to continue with weight loss efforts.  She has agreed to the Category 1 Plan with breakfast and lunch options.   Recipe provided for homemade smoothie-Greek yogurt, fair life milk, 1 serving frozen fruit.  Exercise goals: as is  Behavioral modification strategies: meal planning and cooking strategies and better snacking choices.  Annette Saunders has agreed to follow-up with our clinic in 2 weeks.   No orders of the defined types were placed in this encounter.   There are no discontinued medications.   No orders of the defined types were placed in this encounter.     Objective:   VITALS: Per patient if applicable, see vitals. GENERAL: Alert and in no acute distress. CARDIOPULMONARY: No increased WOB. Speaking in clear sentences.  PSYCH: Pleasant and cooperative. Speech normal rate and rhythm. Affect is appropriate. Insight and judgement are appropriate. Attention is focused, linear, and appropriate.  NEURO: Oriented as arrived to appointment on time with no prompting.   Lab Results  Component Value Date   CREATININE 1.10 (H) 12/19/2021   BUN 22 12/19/2021   NA 138 12/19/2021   K 4.3 12/19/2021   CL 110 12/19/2021   CO2 22 12/19/2021   Lab Results   Component Value Date   ALT 17 12/17/2021   AST 42 (H) 12/17/2021   ALKPHOS 61 12/17/2021   BILITOT 0.8 12/17/2021   Lab Results  Component Value Date   HGBA1C 6.1 (H) 11/17/2021   Lab Results  Component Value Date   INSULIN 18.2 11/17/2021   Lab Results  Component Value Date   TSH 2.240 12/17/2021   Lab Results  Component Value Date   CHOL 187 11/17/2021   HDL 71 11/17/2021   LDLCALC 86 11/17/2021   TRIG 182 (H) 11/17/2021   CHOLHDL 2.6 11/17/2021   Lab Results  Component Value Date   WBC 10.1 12/17/2021   HGB 8.8 (L) 12/17/2021   HCT 28.2 (L) 12/17/2021   MCV 91.9 12/17/2021   PLT 217 12/17/2021   No results found for: "IRON", "TIBC", "FERRITIN" Lab Results  Component Value Date   VD25OH 35.7 11/17/2021   VD25OH 51 06/09/2020   VD25OH 71 10/16/2019    Attestation Statements:   Reviewed by clinician on day of visit: allergies, medications, problem list, medical history, surgical history, family history, social history, and previous encounter notes.  Time spent on visit including the items listed below was 32 minutes.  -preparing to see the patient (e.g., review of tests, history, previous notes) -obtaining and/or reviewing separately obtained history -counseling and educating the patient/family/caregiver -documenting clinical information in the electronic or other health record

## 2022-03-21 ENCOUNTER — Telehealth (INDEPENDENT_AMBULATORY_CARE_PROVIDER_SITE_OTHER): Payer: Medicare Other | Admitting: Family Medicine

## 2022-03-21 ENCOUNTER — Encounter (INDEPENDENT_AMBULATORY_CARE_PROVIDER_SITE_OTHER): Payer: Self-pay | Admitting: Family Medicine

## 2022-03-21 DIAGNOSIS — R7303 Prediabetes: Secondary | ICD-10-CM | POA: Diagnosis not present

## 2022-03-21 DIAGNOSIS — G4700 Insomnia, unspecified: Secondary | ICD-10-CM

## 2022-03-21 DIAGNOSIS — F509 Eating disorder, unspecified: Secondary | ICD-10-CM

## 2022-03-21 DIAGNOSIS — E669 Obesity, unspecified: Secondary | ICD-10-CM

## 2022-03-21 DIAGNOSIS — Z6841 Body Mass Index (BMI) 40.0 and over, adult: Secondary | ICD-10-CM

## 2022-03-30 ENCOUNTER — Ambulatory Visit (INDEPENDENT_AMBULATORY_CARE_PROVIDER_SITE_OTHER): Payer: Medicare Other | Admitting: Family Medicine

## 2022-03-30 ENCOUNTER — Encounter (INDEPENDENT_AMBULATORY_CARE_PROVIDER_SITE_OTHER): Payer: Self-pay

## 2022-03-31 ENCOUNTER — Ambulatory Visit (INDEPENDENT_AMBULATORY_CARE_PROVIDER_SITE_OTHER): Payer: Medicare Other | Admitting: Family Medicine

## 2022-04-01 DIAGNOSIS — Z6837 Body mass index (BMI) 37.0-37.9, adult: Secondary | ICD-10-CM | POA: Diagnosis not present

## 2022-04-01 DIAGNOSIS — R42 Dizziness and giddiness: Secondary | ICD-10-CM | POA: Diagnosis not present

## 2022-04-06 ENCOUNTER — Encounter (INDEPENDENT_AMBULATORY_CARE_PROVIDER_SITE_OTHER): Payer: Self-pay

## 2022-04-14 ENCOUNTER — Telehealth (INDEPENDENT_AMBULATORY_CARE_PROVIDER_SITE_OTHER): Payer: Medicare Other | Admitting: Family Medicine

## 2022-04-19 ENCOUNTER — Ambulatory Visit (INDEPENDENT_AMBULATORY_CARE_PROVIDER_SITE_OTHER): Payer: Medicare Other | Admitting: Family Medicine

## 2022-04-19 ENCOUNTER — Encounter (INDEPENDENT_AMBULATORY_CARE_PROVIDER_SITE_OTHER): Payer: Self-pay | Admitting: Family Medicine

## 2022-04-19 VITALS — BP 90/60 | HR 70 | Temp 97.8°F | Ht 61.0 in | Wt 195.0 lb

## 2022-04-19 DIAGNOSIS — R7303 Prediabetes: Secondary | ICD-10-CM | POA: Diagnosis not present

## 2022-04-19 DIAGNOSIS — E669 Obesity, unspecified: Secondary | ICD-10-CM | POA: Diagnosis not present

## 2022-04-19 DIAGNOSIS — Z6836 Body mass index (BMI) 36.0-36.9, adult: Secondary | ICD-10-CM

## 2022-04-19 DIAGNOSIS — I10 Essential (primary) hypertension: Secondary | ICD-10-CM | POA: Diagnosis not present

## 2022-04-19 DIAGNOSIS — Z6841 Body Mass Index (BMI) 40.0 and over, adult: Secondary | ICD-10-CM

## 2022-04-19 DIAGNOSIS — Z79899 Other long term (current) drug therapy: Secondary | ICD-10-CM | POA: Diagnosis not present

## 2022-04-19 DIAGNOSIS — Z7985 Long-term (current) use of injectable non-insulin antidiabetic drugs: Secondary | ICD-10-CM

## 2022-04-19 MED ORDER — OZEMPIC (0.25 OR 0.5 MG/DOSE) 2 MG/3ML ~~LOC~~ SOPN: 0.2500 mg | PEN_INJECTOR | SUBCUTANEOUS | 0 refills | Status: DC

## 2022-04-24 NOTE — Progress Notes (Unsigned)
Chief Complaint:   OBESITY Annette Saunders is here to discuss her progress with her obesity treatment plan along with follow-up of her obesity related diagnoses. Annette Saunders is on the Category 1 Plan with breakfast and lunch options and states she is following her eating plan approximately 90% of the time. Annette Saunders states she is not currently exercising.  Today's visit was #: 7 Starting weight: 220 lbs Starting date: 11/17/2021 Today's weight: 195 lbs Today's date: 04/19/2022 Total lbs lost to date: 25 Total lbs lost since last in-office visit: 11  Interim History: Annette Saunders has been seeing FNP Annette Saunders in between OV's with me. She is doing great with her weight loss. She saw Dawn, NP at her last OV and was started on Ozempic on 03/07/2022 and has had an 11 lb weight loss since then.  Subjective:   1. Essential hypertension: resolved w/ wt loss, now low BP's Pt had episodes of dizziness a week ago and was seen by PCP. Her BP was low and they discontinued Benicar at that time. Pt no longer has symptoms despite lower BP today. She is not on any BP meds at this time.  2. Prediabetes I explained to pt that insurances are not covering Ozempic for prediabetes- only diabetes now. Pt is unable to afford $400 a month. FNP Annette Saunders provided pt with assistance program information on 03/07/2022 and pt hasn't heard anything yet.   3. Medication management Took time to review medication list in detail with pt to ensure she is not currently on BP lowering meds due to her lower BP's. She is no longer on pain meds or muscle relaxer's. Pt denies taking anything for sleep, except melatonin. She denies using anti-histamine. Pt asymptomatic.  Assessment/Plan:  No orders of the defined types were placed in this encounter.   Medications Discontinued During This Encounter  Medication Reason   Daridorexant HCl (QUVIVIQ) 50 MG TABS    methocarbamol (ROBAXIN) 500 MG tablet    olmesartan (BENICAR) 20 MG tablet    oxyCODONE  (OXY IR/ROXICODONE) 5 MG immediate release tablet    Semaglutide,0.25 or 0.'5MG'$ /DOS, (OZEMPIC, 0.25 OR 0.5 MG/DOSE,) 2 MG/3ML SOPN Reorder     Meds ordered this encounter  Medications   Semaglutide,0.25 or 0.'5MG'$ /DOS, (OZEMPIC, 0.25 OR 0.5 MG/DOSE,) 2 MG/3ML SOPN    Sig: Inject 0.25 mg into the skin once a week.    Dispense:  3 mL    Refill:  0     1. Essential hypertension: resolved w/ wt loss, now low BP's Pt stable and asymptomatic. Annette Saunders is working on healthy weight loss and exercise to improve blood pressure control. We will watch for signs of hypotension as she continues her lifestyle modifications. I recommend pt monitor BP at home occasionally, especially is dizziness resurfaces and also continue prudent nutritional plan with adequate hydration.  2. Prediabetes Annette Saunders will continue to work on weight loss, exercise, and decreasing simple carbohydrates to help decrease the risk of diabetes. Since pt tolerated the 0.25 mg Ozempic well and it is helping with hunger and cravings, I will write script for refill of Ozempic incase patient assistance program will help with cost in the future. We may need to consider other meds if needed in the future, but GFR would need to be rechecked before giving Metformin. Continue prudent nutritional plan.  Refill- Semaglutide,0.25 or 0.'5MG'$ /DOS, (OZEMPIC, 0.25 OR 0.5 MG/DOSE,) 2 MG/3ML SOPN; Inject 0.25 mg into the skin once a week.  Dispense: 3 mL; Refill: 0  3. Medication management  Pt advised that Gabapentin can cause low BP's and I recommend she closely monitor her medication intake at this time. If hypotension symptoms return, pt will f/u with PCP for chronic disease management. Check BP and pulse at home.  4. Obesity with current BMI of 36.8 Annette Saunders is currently in the action stage of change. As such, her goal is to continue with weight loss efforts. She has agreed to the Category 1 Plan with breakfast and lunch options.   Pt will be visiting  her son, daughter-in-law, and grandson in [place] in mid-September and wishes to be seen in office prior to vacation.  Exercise goals:  Pt is still limited by her knee pain.  Behavioral modification strategies: decreasing simple carbohydrates, increasing water intake, and better snacking choices.  Annette Saunders has agreed to follow-up with our clinic in 2-3 weeks. She was informed of the importance of frequent follow-up visits to maximize her success with intensive lifestyle modifications for her multiple health conditions.   Objective:   Blood pressure 90/60, pulse 70, temperature 97.8 F (36.6 C), height '5\' 1"'$  (1.549 m), weight 195 lb (88.5 kg). Body mass index is 36.84 kg/m.  General: Cooperative, alert, well developed, in no acute distress. HEENT: Conjunctivae and lids unremarkable. Cardiovascular: Regular rhythm.  Lungs: Normal work of breathing. Neurologic: No focal deficits.   Lab Results  Component Value Date   CREATININE 1.10 (H) 12/19/2021   BUN 22 12/19/2021   NA 138 12/19/2021   K 4.3 12/19/2021   CL 110 12/19/2021   CO2 22 12/19/2021   Lab Results  Component Value Date   ALT 17 12/17/2021   AST 42 (H) 12/17/2021   ALKPHOS 61 12/17/2021   BILITOT 0.8 12/17/2021   Lab Results  Component Value Date   HGBA1C 6.1 (H) 11/17/2021   Lab Results  Component Value Date   INSULIN 18.2 11/17/2021   Lab Results  Component Value Date   TSH 2.240 12/17/2021   Lab Results  Component Value Date   CHOL 187 11/17/2021   HDL 71 11/17/2021   LDLCALC 86 11/17/2021   TRIG 182 (H) 11/17/2021   CHOLHDL 2.6 11/17/2021   Lab Results  Component Value Date   VD25OH 35.7 11/17/2021   VD25OH 51 06/09/2020   VD25OH 71 10/16/2019   Lab Results  Component Value Date   WBC 10.1 12/17/2021   HGB 8.8 (L) 12/17/2021   HCT 28.2 (L) 12/17/2021   MCV 91.9 12/17/2021   PLT 217 12/17/2021    Attestation Statements:   Reviewed by clinician on day of visit: allergies,  medications, problem list, medical history, surgical history, family history, social history, and previous encounter notes.  Time spent on visit including pre-visit chart review and post-visit care and charting was 48 minutes.   I, Kathlene November, BS, CMA, am acting as transcriptionist for [employer], DO.   I have reviewed the above documentation for accuracy and completeness, and I agree with the above. Marjory Sneddon, D.O.  The [place] was signed into law in 2016 which includes the topic of electronic health records.  This provides immediate access to information in MyChart.  This includes consultation notes, operative notes, office notes, lab results and pathology reports.  If you have any questions about what you read please let us know at your next visit so we can discuss your concerns and take corrective action if need be.  We are right here with you.

## 2022-04-25 ENCOUNTER — Ambulatory Visit (INDEPENDENT_AMBULATORY_CARE_PROVIDER_SITE_OTHER): Payer: Medicare Other | Admitting: Family Medicine

## 2022-05-03 ENCOUNTER — Institutional Professional Consult (permissible substitution): Payer: Medicare Other | Admitting: Plastic Surgery

## 2022-05-11 DIAGNOSIS — F5104 Psychophysiologic insomnia: Secondary | ICD-10-CM | POA: Diagnosis not present

## 2022-05-12 ENCOUNTER — Encounter (INDEPENDENT_AMBULATORY_CARE_PROVIDER_SITE_OTHER): Payer: Self-pay

## 2022-05-12 ENCOUNTER — Ambulatory Visit (INDEPENDENT_AMBULATORY_CARE_PROVIDER_SITE_OTHER): Payer: Medicare Other | Admitting: Family Medicine

## 2022-05-16 ENCOUNTER — Encounter (INDEPENDENT_AMBULATORY_CARE_PROVIDER_SITE_OTHER): Payer: Self-pay | Admitting: Family Medicine

## 2022-05-16 ENCOUNTER — Ambulatory Visit (INDEPENDENT_AMBULATORY_CARE_PROVIDER_SITE_OTHER): Payer: Medicare Other | Admitting: Family Medicine

## 2022-05-16 VITALS — BP 120/78 | Ht 61.0 in | Wt 198.0 lb

## 2022-05-16 DIAGNOSIS — R7303 Prediabetes: Secondary | ICD-10-CM

## 2022-05-16 DIAGNOSIS — Z6841 Body Mass Index (BMI) 40.0 and over, adult: Secondary | ICD-10-CM

## 2022-05-16 DIAGNOSIS — G4709 Other insomnia: Secondary | ICD-10-CM | POA: Diagnosis not present

## 2022-05-16 DIAGNOSIS — Z6837 Body mass index (BMI) 37.0-37.9, adult: Secondary | ICD-10-CM | POA: Diagnosis not present

## 2022-05-16 MED ORDER — TRAZODONE HCL 100 MG PO TABS: 100.0000 mg | ORAL_TABLET | Freq: Every evening | ORAL | 0 refills | Status: DC | PRN

## 2022-05-18 DIAGNOSIS — R7303 Prediabetes: Secondary | ICD-10-CM | POA: Insufficient documentation

## 2022-05-18 DIAGNOSIS — Z79899 Other long term (current) drug therapy: Secondary | ICD-10-CM | POA: Insufficient documentation

## 2022-05-23 ENCOUNTER — Telehealth (INDEPENDENT_AMBULATORY_CARE_PROVIDER_SITE_OTHER): Payer: Self-pay | Admitting: Family Medicine

## 2022-05-23 NOTE — Telephone Encounter (Signed)
Pt needs a refill of Ozempic. Pt needs a three month supply. Please call pt at the number provided.

## 2022-05-23 NOTE — Telephone Encounter (Signed)
Pt advised it will com to the office and she will need to pick up.

## 2022-05-23 NOTE — Progress Notes (Signed)
Chief Complaint:   OBESITY Annette Saunders is here to discuss her progress with her obesity treatment plan along with follow-up of her obesity related diagnoses. Annette Saunders is on the Category 1 Plan with breakfast and lunch options and states she is following her eating plan approximately 0% of the time. Annette Saunders states she is not currently exercising.  Today's visit was #: 8 Starting weight: 220 lbs Starting date: 11/17/2021 Today's weight: 198 lbs Today's date: 05/16/2022 Total lbs lost to date: 22 Total lbs lost since last in-office visit: +3  Interim History: Annette Saunders has been eating off plan the majority of the time. She would like to refocus and get weight off for her joint health (i.e. knees).  Subjective:   1. Other insomnia Pt's PCP started her on Trazodone on 05/11/22. She is taking 50 mg at bedtime and only sleeping 3-4 hours, then awakens. Pt denies side effects and desires to increase does.  2. Prediabetes Annette Saunders reports an increase in hunger and cravings since eating off plan. She is having trouble with the cost of Ozempic. Her A1c was 6.1 on 10/28/2021.  Assessment/Plan:  No orders of the defined types were placed in this encounter.   There are no discontinued medications.   Meds ordered this encounter  Medications   traZODone (DESYREL) 100 MG tablet    Sig: Take 1 tablet (100 mg total) by mouth at bedtime as needed for sleep.    Dispense:  30 tablet    Refill:  0     1. Other insomnia The problem of recurrent insomnia was discussed. Orders and follow up as documented in patient record. Counseling: Intensive lifestyle modifications are the first line treatment for this issue. We discussed several lifestyle modifications today and she will continue to work on diet, exercise and weight loss efforts.  - Increase Trazodone to 100 mg after risks and benefits discussed with pt.  - Importance of sleep with weight loss discussed wit pt. - Sleep hygiene  encouraged.  Counseling Limit or avoid alcohol, caffeinated beverages, and cigarettes, especially close to bedtime.  Do not eat a large meal or eat spicy foods right before bedtime. This can lead to digestive discomfort that can make it hard for you to sleep. Keep a sleep diary to help you and your health care provider figure out what could be causing your insomnia.  Make your bedroom a dark, comfortable place where it is easy to fall asleep. Put up shades or blackout curtains to block light from outside. Use a white noise machine to block noise. Keep the temperature cool. Limit screen use before bedtime. This includes: Watching TV. Using your smartphone, tablet, or computer. Stick to a routine that includes going to bed and waking up at the same times every day and night. This can help you fall asleep faster. Consider making a quiet activity, such as reading, part of your nighttime routine. Try to avoid taking naps during the day so that you sleep better at night. Get out of bed if you are still awake after 15 minutes of trying to sleep. Keep the lights down, but try reading or doing a quiet activity. When you feel sleepy, go back to bed.  Increase & Refill- traZODone (DESYREL) 100 MG tablet; Take 1 tablet (100 mg total) by mouth at bedtime as needed for sleep.  Dispense: 30 tablet; Refill: 0  2. Prediabetes Annette Saunders will continue to work on prudent nutritional plan, weight loss, exercise, and decreasing simple carbohydrates to help decrease  the risk of diabetes. She is seeing her PCP in the near future and if A1c is not obtained, we will obtain it at next OV (pt declines today).  3. Obesity with current BMI of 37.6 Annette Saunders is currently in the action stage of change. As such, her goal is to continue with weight loss efforts. She has agreed to the Category 1 Plan with breakfast and lunch options.   Exercise goals:  Start stationary bike 10 minutes 3 days a week and walk 5-10 minutes 2 days  a week.  Behavioral modification strategies: meal planning and cooking strategies, avoiding temptations, and planning for success.  Annette Saunders has agreed to follow-up with our clinic in 3-4 weeks with FNP Dawn. She was informed of the importance of frequent follow-up visits to maximize her success with intensive lifestyle modifications for her multiple health conditions.   Objective:   Blood pressure 120/78, height 5\' 1"  (1.549 m), weight 198 lb (89.8 kg). Body mass index is 37.41 kg/m.  General: Cooperative, alert, well developed, in no acute distress. HEENT: Conjunctivae and lids unremarkable. Cardiovascular: Regular rhythm.  Lungs: Normal work of breathing. Neurologic: No focal deficits.   Lab Results  Component Value Date   CREATININE 1.10 (H) 12/19/2021   BUN 22 12/19/2021   NA 138 12/19/2021   K 4.3 12/19/2021   CL 110 12/19/2021   CO2 22 12/19/2021   Lab Results  Component Value Date   ALT 17 12/17/2021   AST 42 (H) 12/17/2021   ALKPHOS 61 12/17/2021   BILITOT 0.8 12/17/2021   Lab Results  Component Value Date   HGBA1C 6.1 (H) 11/17/2021   Lab Results  Component Value Date   INSULIN 18.2 11/17/2021   Lab Results  Component Value Date   TSH 2.240 12/17/2021   Lab Results  Component Value Date   CHOL 187 11/17/2021   HDL 71 11/17/2021   LDLCALC 86 11/17/2021   TRIG 182 (H) 11/17/2021   CHOLHDL 2.6 11/17/2021   Lab Results  Component Value Date   VD25OH 35.7 11/17/2021   VD25OH 51 06/09/2020   VD25OH 71 10/16/2019   Lab Results  Component Value Date   WBC 10.1 12/17/2021   HGB 8.8 (L) 12/17/2021   HCT 28.2 (L) 12/17/2021   MCV 91.9 12/17/2021   PLT 217 12/17/2021   No results found for: "IRON", "TIBC", "FERRITIN"  Obesity Behavioral Intervention:   Approximately 15 minutes were spent on the discussion below.  ASK: We discussed the diagnosis of obesity with Annette Saunders today and Annette Saunders agreed to give [place] permission to discuss obesity  behavioral modification therapy today.  ASSESS: Annette Saunders has the diagnosis of obesity and her BMI today is 37.9. Annette Saunders is in the action stage of change.   ADVISE: Annette Saunders was educated on the multiple health risks of obesity as well as the benefit of weight loss to improve her health. She was advised of the need for long term treatment and the importance of lifestyle modifications to improve her current health and to decrease her risk of future health problems.  AGREE: Multiple dietary modification options and treatment options were discussed and Annette Saunders agreed to follow the recommendations documented in the above note.  ARRANGE: Annette Saunders was educated on the importance of frequent visits to treat obesity as outlined per CMS and USPSTF guidelines and agreed to schedule her next follow up appointment today.  Attestation Statements:   Reviewed by clinician on day of visit: allergies, medications, problem list, medical history, surgical history, family  history, social history, and previous encounter notes.  I, Kyung Rudd, BS, CMA, am acting as transcriptionist for [employer], DO.   I have reviewed the above documentation for accuracy and completeness, and I agree with the above. Carlye Grippe, D.O.  The 21st Century Cures Act was signed into law in 2016 which includes the topic of electronic health records.  This provides immediate access to information in MyChart.  This includes consultation notes, operative notes, office notes, lab results and pathology reports.  If you have any questions about what you read please let us know at your next visit so we can discuss your concerns and take corrective action if need be.  We are right here with you.

## 2022-05-26 DIAGNOSIS — Z23 Encounter for immunization: Secondary | ICD-10-CM | POA: Diagnosis not present

## 2022-05-26 DIAGNOSIS — L509 Urticaria, unspecified: Secondary | ICD-10-CM | POA: Diagnosis not present

## 2022-05-26 DIAGNOSIS — Z6838 Body mass index (BMI) 38.0-38.9, adult: Secondary | ICD-10-CM | POA: Diagnosis not present

## 2022-05-26 DIAGNOSIS — E669 Obesity, unspecified: Secondary | ICD-10-CM | POA: Diagnosis not present

## 2022-05-26 DIAGNOSIS — R600 Localized edema: Secondary | ICD-10-CM | POA: Diagnosis not present

## 2022-05-31 ENCOUNTER — Ambulatory Visit (INDEPENDENT_AMBULATORY_CARE_PROVIDER_SITE_OTHER): Payer: Medicare Other | Admitting: Family Medicine

## 2022-06-05 ENCOUNTER — Ambulatory Visit: Payer: Medicare Other

## 2022-06-06 ENCOUNTER — Ambulatory Visit: Payer: Self-pay

## 2022-06-07 DIAGNOSIS — J019 Acute sinusitis, unspecified: Secondary | ICD-10-CM | POA: Diagnosis not present

## 2022-06-07 DIAGNOSIS — G4700 Insomnia, unspecified: Secondary | ICD-10-CM | POA: Diagnosis not present

## 2022-06-07 DIAGNOSIS — M791 Myalgia, unspecified site: Secondary | ICD-10-CM | POA: Diagnosis not present

## 2022-06-07 DIAGNOSIS — B349 Viral infection, unspecified: Secondary | ICD-10-CM | POA: Diagnosis not present

## 2022-06-07 DIAGNOSIS — R091 Pleurisy: Secondary | ICD-10-CM | POA: Diagnosis not present

## 2022-06-07 DIAGNOSIS — R0981 Nasal congestion: Secondary | ICD-10-CM | POA: Diagnosis not present

## 2022-06-09 NOTE — Progress Notes (Signed)
TeleHealth Visit:  This visit was completed with telemedicine (audio/video) technology. Annette Saunders has verbally consented to this TeleHealth visit. The patient is located at home, the provider is located at home. The participants in this visit include the listed provider and patient. The visit was conducted today via phone call.  Unsuccessful attempt was made to connect to video.  Length of call was 25 minutes.   OBESITY Annette Saunders is here to discuss her progress with her obesity treatment plan along with follow-up of her obesity related diagnoses.   Today's visit was # 9 Starting weight: 220 lbs Starting date: 11/17/2021 Weight at last in office visit: 198 lbs on 05/16/22 Total weight loss: 22 lbs at last in office visit on 05/16/22. Today's reported weight: 199 lbs   Nutrition Plan: the Category 1 Plan with breakfast and lunch options.   Current exercise: walking 3-4 days per week for 15 minutes. Riding stationary bike 5 days/week for 20 minutes.   Interim History: Annette Saunders says she has been ill with an upper respiratory infection and has not had much of an appetite, she has eaten mostly soup for the past few weeks. She is now feeling better and is trying to be more strict with the plan.  She says she often does not eat until about 2 PM but then struggles with eating and excessive hunger around 9 PM.  She is status post right total knee replacement on 12/15/2021.  Reports she is doing well.  She has increased her activity and is now walking and riding the stationary bike.  She will be going to [place] to visit family for a month in December.  She was approved to get Ozempic through [employer].  Assessment/Plan:  1. Prediabetes last A1c was elevated at 6.1 on 11/17/2021.   Medication(s): Ozempic 0.25 mg weekly.  Denies side effects.  Notes polyphagia at night. Lab Results  Component Value Date   HGBA1C 6.1 (H) 11/17/2021   Lab Results  Component  Value Date   INSULIN 18.2 11/17/2021    Plan: Increase dose of Ozempic to 0.5 mg weekly.  2. Insomnia Trazodone dose increased to 100 mg at last office visit with Dr. Raliegh Scarlet.  She reports this is working well and she is sleeping much better.  She has had her PCP take over prescribing this medication.  Plan: Continue trazodone 100 mg nightly. Follow-up with PCP for any issues.  3. Obesity: Current BMI 37.5 Annette Saunders is currently in the action stage of change. As such, her goal is to continue with weight loss efforts.  She has agreed to the Category 1 Plan with breakfast and lunch options.  1.  May have protein shake for breakfast such as Premier or the Equate equivalent. 2.  She will try to eat lunch every day.  Exercise goals: As is.  Behavioral modification strategies: increasing lean protein intake, decreasing simple carbohydrates, no skipping meals, and meal planning and cooking strategies.  Annette Saunders has agreed to follow-up with our clinic in 2 weeks.   No orders of the defined types were placed in this encounter.   Medications Discontinued During This Encounter  Medication Reason   Melatonin 10 MG TABS Discontinued by provider   gabapentin (NEURONTIN) 300 MG capsule Completed Course     No orders of the defined types were placed in this encounter.     Objective:   VITALS: Per patient if applicable, see vitals. GENERAL: Alert and in no acute distress. CARDIOPULMONARY: No increased WOB. Speaking  in clear sentences.  PSYCH: Pleasant and cooperative. Speech normal rate and rhythm. Affect is appropriate. Insight and judgement are appropriate. Attention is focused, linear, and appropriate.  NEURO: Oriented as arrived to appointment on time with no prompting.   Lab Results  Component Value Date   CREATININE 1.10 (H) 12/19/2021   BUN 22 12/19/2021   NA 138 12/19/2021   K 4.3 12/19/2021   CL 110 12/19/2021   CO2 22 12/19/2021   Lab Results  Component Value Date    ALT 17 12/17/2021   AST 42 (H) 12/17/2021   ALKPHOS 61 12/17/2021   BILITOT 0.8 12/17/2021   Lab Results  Component Value Date   HGBA1C 6.1 (H) 11/17/2021   Lab Results  Component Value Date   INSULIN 18.2 11/17/2021   Lab Results  Component Value Date   TSH 2.240 12/17/2021   Lab Results  Component Value Date   CHOL 187 11/17/2021   HDL 71 11/17/2021   LDLCALC 86 11/17/2021   TRIG 182 (H) 11/17/2021   CHOLHDL 2.6 11/17/2021   Lab Results  Component Value Date   WBC 10.1 12/17/2021   HGB 8.8 (L) 12/17/2021   HCT 28.2 (L) 12/17/2021   MCV 91.9 12/17/2021   PLT 217 12/17/2021   No results found for: "IRON", "TIBC", "FERRITIN" Lab Results  Component Value Date   VD25OH 35.7 11/17/2021   VD25OH 51 06/09/2020   VD25OH 71 10/16/2019    Attestation Statements:   Reviewed by clinician on day of visit: allergies, medications, problem list, medical history, surgical history, family history, social history, and previous encounter notes.  Time spent on visit including the items listed below was 35 minutes.  -preparing to see the patient (e.g., review of tests, history, previous notes) -obtaining and/or reviewing separately obtained history -counseling and educating the patient/family/caregiver -documenting clinical information in the electronic or other health record

## 2022-06-13 ENCOUNTER — Telehealth (INDEPENDENT_AMBULATORY_CARE_PROVIDER_SITE_OTHER): Payer: Medicare Other | Admitting: Family Medicine

## 2022-06-13 ENCOUNTER — Encounter (INDEPENDENT_AMBULATORY_CARE_PROVIDER_SITE_OTHER): Payer: Self-pay | Admitting: Family Medicine

## 2022-06-13 DIAGNOSIS — R7303 Prediabetes: Secondary | ICD-10-CM | POA: Diagnosis not present

## 2022-06-13 DIAGNOSIS — E66813 Obesity, class 3: Secondary | ICD-10-CM

## 2022-06-13 DIAGNOSIS — E669 Obesity, unspecified: Secondary | ICD-10-CM

## 2022-06-13 DIAGNOSIS — G4709 Other insomnia: Secondary | ICD-10-CM

## 2022-06-13 DIAGNOSIS — Z6837 Body mass index (BMI) 37.0-37.9, adult: Secondary | ICD-10-CM | POA: Diagnosis not present

## 2022-06-20 ENCOUNTER — Encounter (INDEPENDENT_AMBULATORY_CARE_PROVIDER_SITE_OTHER): Payer: Self-pay | Admitting: Family Medicine

## 2022-06-20 ENCOUNTER — Ambulatory Visit (INDEPENDENT_AMBULATORY_CARE_PROVIDER_SITE_OTHER): Payer: Medicare Other | Admitting: Family Medicine

## 2022-06-20 VITALS — BP 120/80 | HR 76 | Temp 97.8°F | Ht 61.0 in | Wt 194.0 lb

## 2022-06-20 DIAGNOSIS — E669 Obesity, unspecified: Secondary | ICD-10-CM

## 2022-06-20 DIAGNOSIS — R7303 Prediabetes: Secondary | ICD-10-CM

## 2022-06-20 DIAGNOSIS — Z6836 Body mass index (BMI) 36.0-36.9, adult: Secondary | ICD-10-CM | POA: Diagnosis not present

## 2022-06-20 MED ORDER — OZEMPIC (0.25 OR 0.5 MG/DOSE) 2 MG/3ML ~~LOC~~ SOPN: 0.2500 mg | PEN_INJECTOR | SUBCUTANEOUS | 0 refills | Status: DC

## 2022-06-21 ENCOUNTER — Other Ambulatory Visit (HOSPITAL_COMMUNITY): Payer: Self-pay | Admitting: Family Medicine

## 2022-06-21 ENCOUNTER — Encounter (HOSPITAL_COMMUNITY): Payer: Self-pay | Admitting: Family Medicine

## 2022-06-21 ENCOUNTER — Other Ambulatory Visit: Payer: Self-pay | Admitting: Family Medicine

## 2022-06-21 DIAGNOSIS — Z8249 Family history of ischemic heart disease and other diseases of the circulatory system: Secondary | ICD-10-CM

## 2022-06-21 DIAGNOSIS — Z96652 Presence of left artificial knee joint: Secondary | ICD-10-CM | POA: Diagnosis not present

## 2022-06-21 DIAGNOSIS — R7401 Elevation of levels of liver transaminase levels: Secondary | ICD-10-CM | POA: Diagnosis not present

## 2022-06-21 DIAGNOSIS — I10 Essential (primary) hypertension: Secondary | ICD-10-CM | POA: Diagnosis not present

## 2022-06-21 DIAGNOSIS — K449 Diaphragmatic hernia without obstruction or gangrene: Secondary | ICD-10-CM | POA: Diagnosis not present

## 2022-06-21 DIAGNOSIS — E782 Mixed hyperlipidemia: Secondary | ICD-10-CM | POA: Diagnosis not present

## 2022-06-21 DIAGNOSIS — Z0000 Encounter for general adult medical examination without abnormal findings: Secondary | ICD-10-CM | POA: Diagnosis not present

## 2022-06-21 DIAGNOSIS — R7301 Impaired fasting glucose: Secondary | ICD-10-CM | POA: Diagnosis not present

## 2022-06-21 DIAGNOSIS — F5104 Psychophysiologic insomnia: Secondary | ICD-10-CM | POA: Diagnosis not present

## 2022-06-21 DIAGNOSIS — R300 Dysuria: Secondary | ICD-10-CM | POA: Diagnosis not present

## 2022-06-21 DIAGNOSIS — K219 Gastro-esophageal reflux disease without esophagitis: Secondary | ICD-10-CM | POA: Diagnosis not present

## 2022-06-21 DIAGNOSIS — N1831 Chronic kidney disease, stage 3a: Secondary | ICD-10-CM | POA: Diagnosis not present

## 2022-06-21 DIAGNOSIS — Z96651 Presence of right artificial knee joint: Secondary | ICD-10-CM | POA: Diagnosis not present

## 2022-06-21 LAB — COMPREHENSIVE METABOLIC PANEL
Albumin: 4.3 (ref 3.5–5.0)
Calcium: 9.7 (ref 8.7–10.7)
Globulin: 2.9
eGFR: 42

## 2022-06-21 LAB — HEPATIC FUNCTION PANEL
ALT: 24 U/L (ref 7–35)
AST: 23 (ref 13–35)
Alkaline Phosphatase: 71 (ref 25–125)
Bilirubin, Total: 0.2

## 2022-06-21 LAB — BASIC METABOLIC PANEL
BUN: 30 — AB (ref 4–21)
CO2: 24 — AB (ref 13–22)
Chloride: 99 (ref 99–108)
Creatinine: 1.3 — AB (ref 0.5–1.1)
Glucose: 92
Potassium: 4.7 mEq/L (ref 3.5–5.1)
Sodium: 138 (ref 137–147)

## 2022-06-21 LAB — CBC AND DIFFERENTIAL
HCT: 36 (ref 36–46)
Hemoglobin: 12 (ref 12.0–16.0)
Neutrophils Absolute: 3.5
Platelets: 280 10*3/uL (ref 150–400)
WBC: 7.2

## 2022-06-21 LAB — CBC: RBC: 4.1 (ref 3.87–5.11)

## 2022-06-21 LAB — LIPID PANEL
Cholesterol: 179 (ref 0–200)
HDL: 86 — AB (ref 35–70)
LDL Cholesterol: 16
LDl/HDL Ratio: 2.1
Triglycerides: 90 (ref 40–160)

## 2022-06-21 LAB — PROTEIN / CREATININE RATIO, URINE
Albumin, U: 6.6
Creatinine, Urine: 83.1

## 2022-06-21 LAB — MICROALBUMIN / CREATININE URINE RATIO: Microalb Creat Ratio: 8

## 2022-06-21 LAB — HEMOGLOBIN A1C: Hemoglobin A1C: 5.8

## 2022-06-27 ENCOUNTER — Other Ambulatory Visit: Payer: Self-pay | Admitting: Gastroenterology

## 2022-06-29 ENCOUNTER — Other Ambulatory Visit: Payer: Self-pay | Admitting: Gastroenterology

## 2022-06-29 ENCOUNTER — Telehealth: Payer: Self-pay

## 2022-06-29 NOTE — Telephone Encounter (Signed)
Pt wants a refill on her Dicyclomine 10 mg cap. Her last ov was 11/10/2021. Please advise

## 2022-06-30 ENCOUNTER — Telehealth: Payer: Self-pay

## 2022-06-30 MED ORDER — DICYCLOMINE HCL 10 MG PO CAPS: ORAL_CAPSULE | ORAL | 3 refills | Status: DC

## 2022-06-30 NOTE — Telephone Encounter (Signed)
Completed.

## 2022-06-30 NOTE — Telephone Encounter (Signed)
Returned the pt's call and was advised she is having "dead reflux" a lot of burning in her chest and throat at night when she is sleep. Only happens at night. I asked the pt was she taking her PPI meds and she responded she didn't know. I asked again same answer. Pt wants to come in or have Rx sent to her pharmacy. Pt was not at home when I called so she couldn't tell me more about the meds. Please advise

## 2022-06-30 NOTE — Telephone Encounter (Signed)
Needs to take Nexium BID. May take Pepcid at night. Avoid eating for 2-3 hours prior to laying down. Put head of bed up on risers if possible.   We can offer a non-urgent visit .

## 2022-06-30 NOTE — Addendum Note (Signed)
Addended by: Annitta Needs on: 06/30/2022 09:23 AM   Modules accepted: Orders

## 2022-07-01 NOTE — Telephone Encounter (Signed)
Phoned and LMOVM for the pt to return call 

## 2022-07-04 ENCOUNTER — Other Ambulatory Visit: Payer: Self-pay | Admitting: Gastroenterology

## 2022-07-04 DIAGNOSIS — K219 Gastro-esophageal reflux disease without esophagitis: Secondary | ICD-10-CM

## 2022-07-04 NOTE — Progress Notes (Unsigned)
Chief Complaint:   OBESITY Annette Saunders is here to discuss her progress with her obesity treatment plan along with follow-up of her obesity related diagnoses. Annette Saunders is on the Category 1 Plan with breakfast and lunch options and states she is following her eating plan approximately 80% of the time. Annette Saunders states she is walking the dog for 20 minutes 3 times per week, and biking for 30 minutes 5 times per week.  Today's visit was #: 10 Starting weight: 220 lbs Starting date: 11/17/2021 Today's weight: 194 lbs Today's date: 06/20/2022 Total lbs lost to date: 26 Total lbs lost since last in-office visit: 4  Interim History: Patient states she is eating a lot more protein lately and drinking more water.  Subjective:   1. Prediabetes Patient denies hunger or cravings currently on Ozempic.  Assessment/Plan:  No orders of the defined types were placed in this encounter.   Medications Discontinued During This Encounter  Medication Reason   Semaglutide,0.25 or 0.'5MG'$ /DOS, (OZEMPIC, 0.25 OR 0.5 MG/DOSE,) 2 MG/3ML SOPN Reorder     Meds ordered this encounter  Medications   Semaglutide,0.25 or 0.'5MG'$ /DOS, (OZEMPIC, 0.25 OR 0.5 MG/DOSE,) 2 MG/3ML SOPN    Sig: Inject 0.25 mg into the skin once a week.    Dispense:  3 mL    Refill:  0     1. Prediabetes Patient will continue Ozempic 0.25 mg once weekly, and we will refill for 1 month with no change in dose.  - Semaglutide,0.25 or 0.'5MG'$ /DOS, (OZEMPIC, 0.25 OR 0.5 MG/DOSE,) 2 MG/3ML SOPN; Inject 0.25 mg into the skin once a week.  Dispense: 3 mL; Refill: 0  2. Obesity with current BMI of 36.8 [name] is currently in the action stage of change. As such, her goal is to continue with weight loss efforts. She has agreed to the Category 1 Plan with breakfast and lunch options.   Patient is to bring in a copy of her labs, which she is having done tomorrow with her PCP.  Exercise goals: As is.   Behavioral modification strategies:  increasing lean protein intake, increasing water intake, and avoiding temptations.  Gordon has agreed to follow-up with our clinic in 3 weeks via virtual with [employer], FNP-C. She was informed of the importance of frequent follow-up visits to maximize her success with intensive lifestyle modifications for her multiple health conditions.   Objective:   Blood pressure 120/80, pulse 76, temperature 97.8 F (36.6 C), height '5\' 1"'$  (1.549 m), weight 194 lb (88 kg), SpO2 97 %. Body mass index is 36.66 kg/m.  General: Cooperative, alert, well developed, in no acute distress. HEENT: Conjunctivae and lids unremarkable. Cardiovascular: Regular rhythm.  Lungs: Normal work of breathing. Neurologic: No focal deficits.   Lab Results  Component Value Date   CREATININE 1.10 (H) 12/19/2021   BUN 22 12/19/2021   NA 138 12/19/2021   K 4.3 12/19/2021   CL 110 12/19/2021   CO2 22 12/19/2021   Lab Results  Component Value Date   ALT 17 12/17/2021   AST 42 (H) 12/17/2021   ALKPHOS 61 12/17/2021   BILITOT 0.8 12/17/2021   Lab Results  Component Value Date   HGBA1C 6.1 (H) 11/17/2021   Lab Results  Component Value Date   INSULIN 18.2 11/17/2021   Lab Results  Component Value Date   TSH 2.240 12/17/2021   Lab Results  Component Value Date   CHOL 187 11/17/2021   HDL 71 11/17/2021   LDLCALC 86 11/17/2021  TRIG 182 (H) 11/17/2021   CHOLHDL 2.6 11/17/2021   Lab Results  Component Value Date   VD25OH 35.7 11/17/2021   VD25OH 51 06/09/2020   VD25OH 71 10/16/2019   Lab Results  Component Value Date   WBC 10.1 12/17/2021   HGB 8.8 (L) 12/17/2021   HCT 28.2 (L) 12/17/2021   MCV 91.9 12/17/2021   PLT 217 12/17/2021   No results found for: "IRON", "TIBC", "FERRITIN"  Obesity Behavioral Intervention:   Approximately 15 minutes were spent on the discussion below.  ASK: We discussed the diagnosis of obesity with Branda today and Lilliemae agreed to give [place] permission to  discuss obesity behavioral modification therapy today.  ASSESS: Terry has the diagnosis of obesity and her BMI today is 36.8. Merlin is in the action stage of change.   ADVISE: Lurlene was educated on the multiple health risks of obesity as well as the benefit of weight loss to improve her health. She was advised of the need for long term treatment and the importance of lifestyle modifications to improve her current health and to decrease her risk of future health problems.  AGREE: Multiple dietary modification options and treatment options were discussed and Tiyana agreed to follow the recommendations documented in the above note.  ARRANGE: Edyth was educated on the importance of frequent visits to treat obesity as outlined per CMS and USPSTF guidelines and agreed to schedule her next follow up appointment today.  Attestation Statements:   Reviewed by clinician on day of visit: allergies, medications, problem list, medical history, surgical history, family history, social history, and previous encounter notes.   Wilhemena Durie, am acting as transcriptionist for [employer], DO.   I have reviewed the above documentation for accuracy and completeness, and I agree with the above. Marjory Sneddon, D.O.  The [place] was signed into law in 2016 which includes the topic of electronic health records.  This provides immediate access to information in MyChart.  This includes consultation notes, operative notes, office notes, lab results and pathology reports.  If you have any questions about what you read please let us know at your next visit so we can discuss your concerns and take corrective action if need be.  We are right here with you.

## 2022-07-05 NOTE — Telephone Encounter (Signed)
Phoned and spoke with the pt and advised of your instructions and pt does want a appt to be seen.  Please schedule pt per Vicente Males

## 2022-07-05 NOTE — Telephone Encounter (Signed)
noted 

## 2022-07-10 NOTE — Progress Notes (Signed)
TeleHealth Visit:  This visit was completed with telemedicine (audio/video) technology. Annette Saunders has verbally consented to this TeleHealth visit. The patient is located at home, the provider is located at home. The participants in this visit include the listed provider and patient. The visit was conducted today via MyChart video.  OBESITY Annette Saunders is here to discuss her progress with her obesity treatment plan along with follow-up of her obesity related diagnoses.   Today's visit was # 11 Starting weight: 220 lbs Starting date: 11/17/2021 Weight at last in office visit: 194 lbs on 06/20/22 Total weight loss: 26 lbs at last in office visit on 06/20/22. Today's reported weight: No weight reported.  Nutrition Plan: Category 1 Plan with breakfast and lunch options.   Current exercise: walking the dog for 20 minutes 3 times per week, and biking for 30 minutes 5 times per week.  Interim History: Annette Saunders has not been able to eat regular meals because of nausea/vomiting from the Ozempic.  She is stopping the Ozempic.  Last injection was about 1 week ago. She is frustrated because she is not losing weight.  She is skipping some meals.  She weighs herself daily. She would like more fruit options.  Assessment/Plan:  1. Prediabetes Annette Saunders has a diagnosis of prediabetes based on her elevated HgA1c. Medication(s): Ozempic 0.5 mg weekly.  Last injection 1 week ago.  Patient would like to stop taking it due to nausea and vomiting.  She tolerated 0.25 mg well. Lab Results  Component Value Date   HGBA1C 6.1 (H) 11/17/2021   Lab Results  Component Value Date   INSULIN 18.2 11/17/2021    Plan: Stop Ozempic for now. Told her to keep the remainder of the Ozempic in her refrigerator. Consider restarting in the future at 0.125 mg weekly.   2. Vitamin D Deficiency Vitamin D is not at goal of 50.  Vitamin D is low at 35.7 on 11/17/2021. She is on weekly prescription Vitamin D 50,000 IU.   Lab Results  Component Value Date   VD25OH 35.7 11/17/2021   VD25OH 51 06/09/2020   VD25OH 71 10/16/2019    Plan: Continue prescription vitamin D 50,000 IU weekly.   3. Obesity: Current BMI 36.7 Annette Saunders is currently in the action stage of change. As such, her goal is to continue with weight loss efforts.  She has agreed to the Category 1 Plan.   She will resume eating all of the food on category 1 when nausea/vomiting subsides. Advised that stall of weight loss could be from skipping meals. She will not weigh more than once weekly at home.  Handout" Smart Fruit sent via MyChart. She may eat for route from the low and low to medium sugar categories.  Serving should be 100 cal or less.  Exercise goals: as is  Behavioral modification strategies: increasing lean protein intake, no skipping meals, and better snacking choices.  Annette Saunders has agreed to follow-up with our clinic in 4 weeks.   No orders of the defined types were placed in this encounter.   Medications Discontinued During This Encounter  Medication Reason   Semaglutide,0.25 or 0.'5MG'$ /DOS, (OZEMPIC, 0.25 OR 0.5 MG/DOSE,) 2 MG/3ML SOPN Side effect (s)     No orders of the defined types were placed in this encounter.     Objective:   VITALS: Per patient if applicable, see vitals. GENERAL: Alert and in no acute distress. CARDIOPULMONARY: No increased WOB. Speaking in clear sentences.  PSYCH: Pleasant and cooperative. Speech normal rate and rhythm. Affect  is appropriate. Insight and judgement are appropriate. Attention is focused, linear, and appropriate.  NEURO: Oriented as arrived to appointment on time with no prompting.   Lab Results  Component Value Date   CREATININE 1.10 (H) 12/19/2021   BUN 22 12/19/2021   NA 138 12/19/2021   K 4.3 12/19/2021   CL 110 12/19/2021   CO2 22 12/19/2021   Lab Results  Component Value Date   ALT 17 12/17/2021   AST 42 (H) 12/17/2021   ALKPHOS 61 12/17/2021   BILITOT  0.8 12/17/2021   Lab Results  Component Value Date   HGBA1C 6.1 (H) 11/17/2021   Lab Results  Component Value Date   INSULIN 18.2 11/17/2021   Lab Results  Component Value Date   TSH 2.240 12/17/2021   Lab Results  Component Value Date   CHOL 187 11/17/2021   HDL 71 11/17/2021   LDLCALC 86 11/17/2021   TRIG 182 (H) 11/17/2021   CHOLHDL 2.6 11/17/2021   Lab Results  Component Value Date   WBC 10.1 12/17/2021   HGB 8.8 (L) 12/17/2021   HCT 28.2 (L) 12/17/2021   MCV 91.9 12/17/2021   PLT 217 12/17/2021   No results found for: "IRON", "TIBC", "FERRITIN" Lab Results  Component Value Date   VD25OH 35.7 11/17/2021   VD25OH 51 06/09/2020   VD25OH 71 10/16/2019    Attestation Statements:   Reviewed by clinician on day of visit: allergies, medications, problem list, medical history, surgical history, family history, social history, and previous encounter notes.  Time spent on visit including the items listed below was 30 minutes.  -preparing to see the patient (e.g., review of tests, history, previous notes) -obtaining and/or reviewing separately obtained history -counseling and educating the patient/family/caregiver -documenting clinical information in the electronic or other health record

## 2022-07-11 ENCOUNTER — Encounter (INDEPENDENT_AMBULATORY_CARE_PROVIDER_SITE_OTHER): Payer: Self-pay | Admitting: Family Medicine

## 2022-07-11 ENCOUNTER — Telehealth (INDEPENDENT_AMBULATORY_CARE_PROVIDER_SITE_OTHER): Payer: Medicare Other | Admitting: Family Medicine

## 2022-07-11 DIAGNOSIS — E669 Obesity, unspecified: Secondary | ICD-10-CM

## 2022-07-11 DIAGNOSIS — Z6841 Body Mass Index (BMI) 40.0 and over, adult: Secondary | ICD-10-CM

## 2022-07-11 DIAGNOSIS — E559 Vitamin D deficiency, unspecified: Secondary | ICD-10-CM | POA: Diagnosis not present

## 2022-07-11 DIAGNOSIS — R7303 Prediabetes: Secondary | ICD-10-CM

## 2022-07-11 DIAGNOSIS — Z6836 Body mass index (BMI) 36.0-36.9, adult: Secondary | ICD-10-CM | POA: Diagnosis not present

## 2022-07-14 DIAGNOSIS — R112 Nausea with vomiting, unspecified: Secondary | ICD-10-CM | POA: Diagnosis not present

## 2022-07-14 DIAGNOSIS — Z7689 Persons encountering health services in other specified circumstances: Secondary | ICD-10-CM | POA: Diagnosis not present

## 2022-07-14 DIAGNOSIS — R197 Diarrhea, unspecified: Secondary | ICD-10-CM | POA: Diagnosis not present

## 2022-07-25 ENCOUNTER — Telehealth (INDEPENDENT_AMBULATORY_CARE_PROVIDER_SITE_OTHER): Payer: Medicare Other | Admitting: Family Medicine

## 2022-07-25 ENCOUNTER — Encounter (INDEPENDENT_AMBULATORY_CARE_PROVIDER_SITE_OTHER): Payer: Self-pay | Admitting: Family Medicine

## 2022-07-25 DIAGNOSIS — E669 Obesity, unspecified: Secondary | ICD-10-CM

## 2022-07-25 DIAGNOSIS — Z6836 Body mass index (BMI) 36.0-36.9, adult: Secondary | ICD-10-CM | POA: Diagnosis not present

## 2022-07-25 DIAGNOSIS — N1832 Chronic kidney disease, stage 3b: Secondary | ICD-10-CM | POA: Diagnosis not present

## 2022-07-25 DIAGNOSIS — F509 Eating disorder, unspecified: Secondary | ICD-10-CM | POA: Diagnosis not present

## 2022-07-25 MED ORDER — TOPIRAMATE 25 MG PO TABS: 25.0000 mg | ORAL_TABLET | Freq: Every day | ORAL | 0 refills | Status: DC

## 2022-07-25 NOTE — Progress Notes (Signed)
TeleHealth Visit:  This visit was completed with telemedicine (audio/video) technology. Annette Saunders has verbally consented to this TeleHealth visit. The patient is located at home, the provider is located at home. The participants in this visit include the listed provider and patient. The visit was conducted today via MyChart video.  OBESITY Marion is here to discuss her progress with her obesity treatment plan along with follow-up of her obesity related diagnoses.   Today's visit was # 12 Starting weight: 220 lbs Starting date: 11/17/2021 Weight at last in office visit: 194 lbs on 06/20/22 Total weight loss: 26 lbs at last in office visit on 06/20/22. Today's reported weight: No weight reported.  Nutrition Plan: Category 1 Plan with breakfast and lunch options.   Current exercise:  walking the dog for 20 minutes 3 times per week  Interim History: Annette Saunders reports her GI symptoms have subsided since stopping Ozempic.  Now she feels like her eating is out of control.  She is still skipping meals.  For instance, today she has not yet eaten and it is 3:00 pm.  She had a protein shake this morning.  She says she gets hungry around 5 PM. She has been working on increasing her water. She has not been riding her exercise bike due to knee pain.  She is headed to [place] December 12 to see family.  She will be staying for about a month.  She is planning on grocery shopping when she gets there to get her foods for the plan.  Assessment/Plan:  1. Eating disorder/emotional eating Has had a lot of cravings since stopping Ozempic.  Also notes increased hunger.  Last GFR was 42 on 06/21/2022 ([facility] Everywhere).  Metformin is not a good option for her. Medication(s): none She had 1 kidney stone in her 47s but none since then.  Denies having glaucoma.  Plan: New Prescription: Topiramate 25 mg daily with supper.  Discussed possible side effects.  2. CKD 3b Most recent GFR was 42 at PCP on  06/21/2022.  She is trying to increase her water intake.  She does not take NSAIDs.  Latest Reference Range & Units 08/12/21 10:46 12/06/21 08:40 12/16/21 03:01 12/16/21 12:23 12/17/21 06:45 12/18/21 02:47 12/19/21 02:52  GFR, Estimated >60 mL/min 53 (L) 38 (L) 34 (L) 24 (L) 19 (L) 47 (L) 52 (L)  (L): Data is abnormally low  Plan: Continue to drink plenty of water. Avoid NSAIDs.  3. Obesity: Current BMI 36.7 Annette Saunders is currently in the action stage of change. As such, her goal is to continue with weight loss efforts.  She has agreed to the Category 1 Plan.   May continue having protein shake for breakfast. Discussed importance of having 3 meals per day and adequate protein for best success with weight loss.  Exercise goals:  as is  Behavioral modification strategies: increasing lean protein intake, decreasing simple carbohydrates, no skipping meals, and travel eating strategies.  Annette Saunders has agreed to follow-up with our clinic in 2 weeks.   No orders of the defined types were placed in this encounter.   There are no discontinued medications.   Meds ordered this encounter  Medications   topiramate (TOPAMAX) 25 MG tablet    Sig: Take 1 tablet (25 mg total) by mouth daily with supper.    Dispense:  30 tablet    Refill:  0    Order Specific Question:   Supervising Provider    Answer:   Netty Starring  Objective:   VITALS: Per patient if applicable, see vitals. GENERAL: Alert and in no acute distress. CARDIOPULMONARY: No increased WOB. Speaking in clear sentences.  PSYCH: Pleasant and cooperative. Speech normal rate and rhythm. Affect is appropriate. Insight and judgement are appropriate. Attention is focused, linear, and appropriate.  NEURO: Oriented as arrived to appointment on time with no prompting.   Lab Results  Component Value Date   CREATININE 1.10 (H) 12/19/2021   BUN 22 12/19/2021   NA 138 12/19/2021   K 4.3 12/19/2021   CL 110 12/19/2021   CO2 22  12/19/2021   Lab Results  Component Value Date   ALT 17 12/17/2021   AST 42 (H) 12/17/2021   ALKPHOS 61 12/17/2021   BILITOT 0.8 12/17/2021   Lab Results  Component Value Date   HGBA1C 6.1 (H) 11/17/2021   Lab Results  Component Value Date   INSULIN 18.2 11/17/2021   Lab Results  Component Value Date   TSH 2.240 12/17/2021   Lab Results  Component Value Date   CHOL 187 11/17/2021   HDL 71 11/17/2021   LDLCALC 86 11/17/2021   TRIG 182 (H) 11/17/2021   CHOLHDL 2.6 11/17/2021   Lab Results  Component Value Date   WBC 10.1 12/17/2021   HGB 8.8 (L) 12/17/2021   HCT 28.2 (L) 12/17/2021   MCV 91.9 12/17/2021   PLT 217 12/17/2021   No results found for: "IRON", "TIBC", "FERRITIN" Lab Results  Component Value Date   VD25OH 35.7 11/17/2021   VD25OH 51 06/09/2020   VD25OH 71 10/16/2019    Attestation Statements:   Reviewed by clinician on day of visit: allergies, medications, problem list, medical history, surgical history, family history, social history, and previous encounter notes.

## 2022-07-26 ENCOUNTER — Institutional Professional Consult (permissible substitution): Payer: Medicare Other | Admitting: Plastic Surgery

## 2022-07-27 ENCOUNTER — Encounter (INDEPENDENT_AMBULATORY_CARE_PROVIDER_SITE_OTHER): Payer: Self-pay | Admitting: Family Medicine

## 2022-08-01 ENCOUNTER — Telehealth (INDEPENDENT_AMBULATORY_CARE_PROVIDER_SITE_OTHER): Payer: Medicare Other | Admitting: Family Medicine

## 2022-08-01 ENCOUNTER — Telehealth (INDEPENDENT_AMBULATORY_CARE_PROVIDER_SITE_OTHER): Payer: Self-pay | Admitting: Family Medicine

## 2022-08-01 ENCOUNTER — Ambulatory Visit (INDEPENDENT_AMBULATORY_CARE_PROVIDER_SITE_OTHER): Payer: Medicare Other | Admitting: Family Medicine

## 2022-08-01 DIAGNOSIS — F509 Eating disorder, unspecified: Secondary | ICD-10-CM | POA: Diagnosis not present

## 2022-08-01 MED ORDER — TOPIRAMATE 50 MG PO TABS: 50.0000 mg | ORAL_TABLET | Freq: Two times a day (BID) | ORAL | 0 refills | Status: DC

## 2022-08-01 NOTE — Telephone Encounter (Signed)
Pt called wanting to speak with Dawn. Pt states that the medicine Dawn prescribed is not working. Pt would like for Dawn to call her back. [facility].

## 2022-08-01 NOTE — Telephone Encounter (Signed)
Patient called and said topiramate 25 mg started at last office visit was not helping with appetite in the evening.  Will increase dose to 50 mg at supper.  Patient has follow-up appointment next week.

## 2022-08-03 NOTE — Progress Notes (Signed)
TeleHealth Visit:  This visit was completed with telemedicine (audio/video) technology. Annette Saunders has verbally consented to this TeleHealth visit. The patient is located at home, the provider is located at home. The participants in this visit include the listed provider and patient. The visit was conducted today via MyChart video.  OBESITY Annette Saunders is here to discuss her progress with her obesity treatment plan along with follow-up of her obesity related diagnoses.   Today's visit was # 13 Starting weight: 220 lbs Starting date: 11/17/2021 Weight at last in office visit: 194 lbs on 06/20/22 Total weight loss: 26 lbs at last in office visit on 06/20/22. Today's reported weight:  No weight reported.  Nutrition Plan: Category 1 Plan with breakfast and lunch options.    Current exercise:  walking the dog for 15 minutes 3 times per week  Interim History: Annette Saunders says she has been doing better with the meal plan over the last few weeks.  She had been struggling with a lot of hunger.  Was previously on Ozempic but it caused severe nausea and vomiting and it was discontinued.  Reports hunger and cravings better controlled.  Generally skips breakfast due to lack of hunger but she adds in protein from breakfast at lunch. Reports she has knee pain if she walks very far.  She is going to visit family in [place] tomorrow for a whole month.  Assessment/Plan:  1. Eating disorder/emotional eating Started on topiramate 25 mg every evening-noticed no improvement.  Dose increased to 50 mg and she has been taking it at 3 PM.  Notices less snacking.  Denies side effects.  Plan: Refill topiramate 50 mg daily.  2. Prediabetes Most recent A1c we have on file is 6.1 in March of this year.  She says she had recent labs at PCP and will call PCP to have them sent to [place]. Medication(s): None.  Unable to tolerate GLP-1 therapy. Lab Results  Component Value Date   HGBA1C 6.1 (H) 11/17/2021   Lab  Results  Component Value Date   INSULIN 18.2 11/17/2021    Plan: Consider starting metformin if A1c above 6. Patient to have recent labs sent to [place].  3. Obesity: Current BMI 36.7 Annette Saunders is currently in the action stage of change. As such, her goal is to continue with weight loss efforts.  She has agreed to the Category 2 Plan.   Exercise goals:  as is  Behavioral modification strategies: increasing lean protein intake, decreasing simple carbohydrates, and planning for success.  Annette Saunders has agreed to follow-up with our clinic in 5 weeks.   No orders of the defined types were placed in this encounter.   Medications Discontinued During This Encounter  Medication Reason   topiramate (TOPAMAX) 50 MG tablet Reorder     Meds ordered this encounter  Medications   topiramate (TOPAMAX) 50 MG tablet    Sig: Take 1 tablet (50 mg total) by mouth daily.    Dispense:  30 tablet    Refill:  0    Order Specific Question:   Supervising Provider    Answer:   Annette Saunders [[id number]]      Objective:   VITALS: Per patient if applicable, see vitals. GENERAL: Alert and in no acute distress. CARDIOPULMONARY: No increased WOB. Speaking in clear sentences.  PSYCH: Pleasant and cooperative. Speech normal rate and rhythm. Affect is appropriate. Insight and judgement are appropriate. Attention is focused, linear, and appropriate.  NEURO: Oriented as arrived to appointment on time with no  prompting.   Lab Results  Component Value Date   CREATININE 1.10 (H) 12/19/2021   BUN 22 12/19/2021   NA 138 12/19/2021   K 4.3 12/19/2021   CL 110 12/19/2021   CO2 22 12/19/2021   Lab Results  Component Value Date   ALT 17 12/17/2021   AST 42 (H) 12/17/2021   ALKPHOS 61 12/17/2021   BILITOT 0.8 12/17/2021   Lab Results  Component Value Date   HGBA1C 6.1 (H) 11/17/2021   Lab Results  Component Value Date   INSULIN 18.2 11/17/2021   Lab Results  Component Value Date   TSH 2.240 12/17/2021    Lab Results  Component Value Date   CHOL 187 11/17/2021   HDL 71 11/17/2021   LDLCALC 86 11/17/2021   TRIG 182 (H) 11/17/2021   CHOLHDL 2.6 11/17/2021   Lab Results  Component Value Date   WBC 10.1 12/17/2021   HGB 8.8 (L) 12/17/2021   HCT 28.2 (L) 12/17/2021   MCV 91.9 12/17/2021   PLT 217 12/17/2021   No results found for: "IRON", "TIBC", "FERRITIN" Lab Results  Component Value Date   VD25OH 35.7 11/17/2021   VD25OH 51 06/09/2020   VD25OH 71 10/16/2019    Attestation Statements:   Reviewed by clinician on day of visit: allergies, medications, problem list, medical history, surgical history, family history, social history, and previous encounter notes.

## 2022-08-04 NOTE — Telephone Encounter (Signed)
Please advise 

## 2022-08-08 ENCOUNTER — Telehealth (INDEPENDENT_AMBULATORY_CARE_PROVIDER_SITE_OTHER): Payer: Medicare Other | Admitting: Family Medicine

## 2022-08-08 ENCOUNTER — Ambulatory Visit (INDEPENDENT_AMBULATORY_CARE_PROVIDER_SITE_OTHER): Payer: Medicare Other | Admitting: Family Medicine

## 2022-08-08 ENCOUNTER — Encounter (INDEPENDENT_AMBULATORY_CARE_PROVIDER_SITE_OTHER): Payer: Self-pay | Admitting: Family Medicine

## 2022-08-08 DIAGNOSIS — Z6836 Body mass index (BMI) 36.0-36.9, adult: Secondary | ICD-10-CM | POA: Diagnosis not present

## 2022-08-08 DIAGNOSIS — F509 Eating disorder, unspecified: Secondary | ICD-10-CM | POA: Diagnosis not present

## 2022-08-08 DIAGNOSIS — E669 Obesity, unspecified: Secondary | ICD-10-CM

## 2022-08-08 DIAGNOSIS — R7303 Prediabetes: Secondary | ICD-10-CM

## 2022-08-08 MED ORDER — TOPIRAMATE 50 MG PO TABS: 50.0000 mg | ORAL_TABLET | Freq: Every day | ORAL | 0 refills | Status: DC

## 2022-09-05 ENCOUNTER — Telehealth (INDEPENDENT_AMBULATORY_CARE_PROVIDER_SITE_OTHER): Payer: Medicare Other | Admitting: Family Medicine

## 2022-09-12 ENCOUNTER — Ambulatory Visit (INDEPENDENT_AMBULATORY_CARE_PROVIDER_SITE_OTHER): Payer: Medicare Other | Admitting: Family Medicine

## 2022-09-12 ENCOUNTER — Encounter (INDEPENDENT_AMBULATORY_CARE_PROVIDER_SITE_OTHER): Payer: Self-pay | Admitting: Family Medicine

## 2022-09-12 VITALS — BP 156/90 | HR 79 | Temp 97.7°F | Ht 61.0 in | Wt 198.6 lb

## 2022-09-12 DIAGNOSIS — R7303 Prediabetes: Secondary | ICD-10-CM | POA: Diagnosis not present

## 2022-09-12 DIAGNOSIS — Z6837 Body mass index (BMI) 37.0-37.9, adult: Secondary | ICD-10-CM

## 2022-09-12 DIAGNOSIS — F509 Eating disorder, unspecified: Secondary | ICD-10-CM | POA: Diagnosis not present

## 2022-09-12 DIAGNOSIS — E669 Obesity, unspecified: Secondary | ICD-10-CM | POA: Diagnosis not present

## 2022-09-12 DIAGNOSIS — I10 Essential (primary) hypertension: Secondary | ICD-10-CM | POA: Diagnosis not present

## 2022-09-12 MED ORDER — TOPIRAMATE 50 MG PO TABS: 50.0000 mg | ORAL_TABLET | Freq: Two times a day (BID) | ORAL | 0 refills | Status: DC

## 2022-09-13 ENCOUNTER — Ambulatory Visit: Admitting: Gastroenterology

## 2022-09-15 ENCOUNTER — Ambulatory Visit (INDEPENDENT_AMBULATORY_CARE_PROVIDER_SITE_OTHER): Payer: Medicare Other | Admitting: Family Medicine

## 2022-09-19 ENCOUNTER — Telehealth (INDEPENDENT_AMBULATORY_CARE_PROVIDER_SITE_OTHER): Payer: Medicare Other | Admitting: Family Medicine

## 2022-09-19 ENCOUNTER — Encounter (INDEPENDENT_AMBULATORY_CARE_PROVIDER_SITE_OTHER): Payer: Self-pay | Admitting: Family Medicine

## 2022-09-19 DIAGNOSIS — I10 Essential (primary) hypertension: Secondary | ICD-10-CM | POA: Diagnosis not present

## 2022-09-19 DIAGNOSIS — N1832 Chronic kidney disease, stage 3b: Secondary | ICD-10-CM

## 2022-09-19 DIAGNOSIS — Z6837 Body mass index (BMI) 37.0-37.9, adult: Secondary | ICD-10-CM

## 2022-09-19 DIAGNOSIS — F509 Eating disorder, unspecified: Secondary | ICD-10-CM | POA: Diagnosis not present

## 2022-09-19 DIAGNOSIS — E669 Obesity, unspecified: Secondary | ICD-10-CM

## 2022-09-19 DIAGNOSIS — R7303 Prediabetes: Secondary | ICD-10-CM

## 2022-09-19 DIAGNOSIS — I129 Hypertensive chronic kidney disease with stage 1 through stage 4 chronic kidney disease, or unspecified chronic kidney disease: Secondary | ICD-10-CM

## 2022-09-19 DIAGNOSIS — Z79899 Other long term (current) drug therapy: Secondary | ICD-10-CM | POA: Diagnosis not present

## 2022-09-19 DIAGNOSIS — G4700 Insomnia, unspecified: Secondary | ICD-10-CM | POA: Diagnosis not present

## 2022-09-19 DIAGNOSIS — N1831 Chronic kidney disease, stage 3a: Secondary | ICD-10-CM | POA: Diagnosis not present

## 2022-09-19 MED ORDER — BUPROPION HCL ER (SR) 150 MG PO TB12: 150.0000 mg | ORAL_TABLET | Freq: Every day | ORAL | 0 refills | Status: DC

## 2022-09-19 NOTE — Progress Notes (Signed)
TeleHealth Visit:  This visit was completed with telemedicine (audio/video) technology. Annette Saunders has verbally consented to this TeleHealth visit. The patient is located at home, the provider is located at home. The participants in this visit include the listed provider and patient. The visit was conducted today via MyChart video.  OBESITY Annette Saunders is here to discuss her progress with her obesity treatment plan along with follow-up of her obesity related diagnoses.   Today's visit was # 15 Starting weight: 220 lbs Starting date: 11/17/2021 Weight at last in office visit: 198 lbs on 09/12/22 Total weight loss: 22 lbs at last in office visit on 09/12/22. Today's reported weight:  No weight reported.  Nutrition Plan: Category 1 Plan with breakfast and lunch options.    Current exercise:   Riding exercise bike for 10 minutes daily.  Interim History:  Annette Saunders is really struggling with nighttime eating.  She is taking 100 mg of topiramate in afternoon and does not notice a difference with cravings/appetite.  She reports 100% plan adherence.  She is getting in all of the prescribed protein. She is consistently riding her exercise bike 10 mg daily and will increase to 15 minutes daily next week.  She feels this has helped her knee pain.  She will be going back to [place] in 2 weeks to visit family and will stay for 2 weeks.  Assessment/Plan:  1. Hypertension Hypertension borderline controlled.  Medication(s): Chlorthalidone 12.5 mg daily.  Reports good compliance. Most recent GFR was 42, down from 52 in April.  BP Readings from Last 3 Encounters:  09/12/22 (!) 156/90  06/20/22 120/80  05/16/22 120/78   Lab Results  Component Value Date   CREATININE 1.3 (A) 06/21/2022   CREATININE 1.10 (H) 12/19/2021   CREATININE 1.21 (H) 12/18/2021    Plan: Make PCP appointment to discuss blood pressure. Check blood pressure daily and take results to PCP visit Continue chlorthalidone.   ACE/ARB would be a good addition considering chronic kidney disease. Avoid added salt in diet.  Recommended less than 2000 mg daily.  2. Eating disorder/emotional eating Annette Saunders has issues with night eating.  She has been taking topiramate 100 mg around 4 PM without results. Currently this is poorly controlled.  Has been on bupropion in the past without results.  Unable to tolerate Ozempic.  Plan: Discontinue topiramate. New prescription-bupropion 150 mg in the morning.  Reports no history of glaucoma or seizures. We will continue to monitor blood pressure.  3. Prediabetes Last A1c elevated at 5.8 on 06/21/2022. Medication(s): None.  Was on Ozempic previously but had severe nausea and vomiting. Lab Results  Component Value Date   HGBA1C 5.8 06/21/2022   Lab Results  Component Value Date   INSULIN 18.2 11/17/2021    Plan: Discussed trying Trulicity briefly but she is concerned about cost.  She is interested in metformin. In light of last GFR (42), metformin contraindicated.  4.  CKD 3B Most recent GFR was 42, down from 52 in April.  Last creatinine 1.3.  Avoids NSAID's.  Drinking plenty of water.  Blood pressure not well-controlled. Lab Results  Component Value Date   CREATININE 1.3 (A) 06/21/2022   BUN 30 (A) 06/21/2022   NA 138 06/21/2022   K 4.7 06/21/2022   CL 99 06/21/2022   CO2 24 (A) 06/21/2022     Plan: See PCP regarding blood pressure and kidney function. Avoid NSAIDs.   Obesity: Current BMI 37.6 Annette Saunders is currently in the action stage of change. As such,  her goal is to continue with weight loss efforts.  She has agreed to Category 1 Plan with breakfast and lunch options.   Exercise goals: Increase exercise bike to 15 minutes daily.  Behavioral modification strategies: increasing lean protein intake, decreasing simple carbohydrates, increasing water intake, decreasing sodium intake, and planning for success.  Annette Saunders has agreed to follow-up with our  clinic in 4 weeks.   No orders of the defined types were placed in this encounter.   Medications Discontinued During This Encounter  Medication Reason   topiramate (TOPAMAX) 50 MG tablet Change in therapy     Meds ordered this encounter  Medications   buPROPion (WELLBUTRIN SR) 150 MG 12 hr tablet    Sig: Take 1 tablet (150 mg total) by mouth daily with breakfast.    Dispense:  30 tablet    Refill:  0    Order Specific Question:   Supervising Provider    Answer:   Dell Ponto [[id number]]      Objective:   VITALS: Per patient if applicable, see vitals. GENERAL: Alert and in no acute distress. CARDIOPULMONARY: No increased WOB. Speaking in clear sentences.  PSYCH: Pleasant and cooperative. Speech normal rate and rhythm. Affect is appropriate. Insight and judgement are appropriate. Attention is focused, linear, and appropriate.  NEURO: Oriented as arrived to appointment on time with no prompting.   Lab Results  Component Value Date   CREATININE 1.3 (A) 06/21/2022   BUN 30 (A) 06/21/2022   NA 138 06/21/2022   K 4.7 06/21/2022   CL 99 06/21/2022   CO2 24 (A) 06/21/2022   Lab Results  Component Value Date   ALT 24 06/21/2022   AST 23 06/21/2022   ALKPHOS 71 06/21/2022   BILITOT 0.8 12/17/2021   Lab Results  Component Value Date   HGBA1C 5.8 06/21/2022   HGBA1C 6.1 (H) 11/17/2021   Lab Results  Component Value Date   INSULIN 18.2 11/17/2021   Lab Results  Component Value Date   TSH 2.240 12/17/2021   Lab Results  Component Value Date   CHOL 179 06/21/2022   HDL 86 (A) 06/21/2022   LDLCALC 16 06/21/2022   TRIG 90 06/21/2022   CHOLHDL 2.6 11/17/2021   Lab Results  Component Value Date   WBC 7.2 06/21/2022   HGB 12.0 06/21/2022   HCT 36 06/21/2022   MCV 91.9 12/17/2021   PLT 280 06/21/2022   No results found for: "IRON", "TIBC", "FERRITIN" Lab Results  Component Value Date   VD25OH 35.7 11/17/2021   VD25OH 51 06/09/2020   VD25OH 71 10/16/2019     Attestation Statements:   Reviewed by clinician on day of visit: allergies, medications, problem list, medical history, surgical history, family history, social history, and previous encounter notes.

## 2022-09-28 DIAGNOSIS — M5451 Vertebrogenic low back pain: Secondary | ICD-10-CM | POA: Diagnosis not present

## 2022-09-29 ENCOUNTER — Encounter (INDEPENDENT_AMBULATORY_CARE_PROVIDER_SITE_OTHER): Payer: Self-pay | Admitting: Family Medicine

## 2022-09-29 ENCOUNTER — Telehealth (INDEPENDENT_AMBULATORY_CARE_PROVIDER_SITE_OTHER): Payer: Medicare Other | Admitting: Family Medicine

## 2022-09-29 DIAGNOSIS — E669 Obesity, unspecified: Secondary | ICD-10-CM

## 2022-09-29 DIAGNOSIS — N1832 Chronic kidney disease, stage 3b: Secondary | ICD-10-CM | POA: Diagnosis not present

## 2022-09-29 DIAGNOSIS — F509 Eating disorder, unspecified: Secondary | ICD-10-CM | POA: Diagnosis not present

## 2022-09-29 DIAGNOSIS — I10 Essential (primary) hypertension: Secondary | ICD-10-CM

## 2022-09-29 DIAGNOSIS — I129 Hypertensive chronic kidney disease with stage 1 through stage 4 chronic kidney disease, or unspecified chronic kidney disease: Secondary | ICD-10-CM

## 2022-09-29 DIAGNOSIS — M5416 Radiculopathy, lumbar region: Secondary | ICD-10-CM | POA: Diagnosis not present

## 2022-09-29 DIAGNOSIS — Z6837 Body mass index (BMI) 37.0-37.9, adult: Secondary | ICD-10-CM

## 2022-09-29 MED ORDER — TOPIRAMATE 50 MG PO TABS: ORAL_TABLET | ORAL | 0 refills | Status: DC

## 2022-09-29 NOTE — Progress Notes (Deleted)
TeleHealth Visit:  This visit was completed with telemedicine (audio/video) technology. Annette Saunders has verbally consented to this TeleHealth visit. The patient is located at home, the provider is located at home. The participants in this visit include the listed provider and patient. The visit was conducted today via MyChart video.  OBESITY Annette Saunders is here to discuss her progress with her obesity treatment plan along with follow-up of her obesity related diagnoses.   Today's visit was # 16 Starting weight: 220 lbs Starting date: 11/17/2021 Weight at last in office visit: 198 lbs on 09/12/22 Total weight loss: 22 lbs at last in office visit on 09/12/22. Today's reported weight: *** lbs No weight reported.  Nutrition Plan: Category 1 Plan with breakfast and lunch options.    Current exercise:   Riding exercise bike for 10 minutes daily.  Interim History:  ***  Assessment/Plan:  1. ***  2. ***  3. ***  Obesity: Current BMI *** Annette Saunders {CHL AMB IS/IS NOT:210130109} currently in the action stage of change. As such, her goal is to {MWMwtloss#1:210800005}.  She has agreed to {MWMwtlossportion/plan2:23431}.   Exercise goals: {MWM EXERCISE RECS:23473}  Behavioral modification strategies: {MWMwtlossdietstrategies3:23432}.  [name] has agreed to follow-up with our clinic in {NUMBER 1-10:22536} weeks.   No orders of the defined types were placed in this encounter.   There are no discontinued medications.   No orders of the defined types were placed in this encounter.     Objective:   VITALS: Per patient if applicable, see vitals. GENERAL: Alert and in no acute distress. CARDIOPULMONARY: No increased WOB. Speaking in clear sentences.  PSYCH: Pleasant and cooperative. Speech normal rate and rhythm. Affect is appropriate. Insight and judgement are appropriate. Attention is focused, linear, and appropriate.  NEURO: Oriented as arrived to appointment on time with no  prompting.   Lab Results  Component Value Date   CREATININE 1.3 (A) 06/21/2022   BUN 30 (A) 06/21/2022   NA 138 06/21/2022   K 4.7 06/21/2022   CL 99 06/21/2022   CO2 24 (A) 06/21/2022   Lab Results  Component Value Date   ALT 24 06/21/2022   AST 23 06/21/2022   ALKPHOS 71 06/21/2022   BILITOT 0.8 12/17/2021   Lab Results  Component Value Date   HGBA1C 5.8 06/21/2022   HGBA1C 6.1 (H) 11/17/2021   Lab Results  Component Value Date   INSULIN 18.2 11/17/2021   Lab Results  Component Value Date   TSH 2.240 12/17/2021   Lab Results  Component Value Date   CHOL 179 06/21/2022   HDL 86 (A) 06/21/2022   LDLCALC 16 06/21/2022   TRIG 90 06/21/2022   CHOLHDL 2.6 11/17/2021   Lab Results  Component Value Date   WBC 7.2 06/21/2022   HGB 12.0 06/21/2022   HCT 36 06/21/2022   MCV 91.9 12/17/2021   PLT 280 06/21/2022   No results found for: "IRON", "TIBC", "FERRITIN" Lab Results  Component Value Date   VD25OH 35.7 11/17/2021   VD25OH 51 06/09/2020   VD25OH 71 10/16/2019    Attestation Statements:   Reviewed by clinician on day of visit: allergies, medications, problem list, medical history, surgical history, family history, social history, and previous encounter notes.  ***(delete if time-based billing not used) Time spent on visit including the items listed below was *** minutes.  -preparing to see the patient (e.g., review of tests, history, previous notes) -obtaining and/or reviewing separately obtained history -counseling and educating the patient/family/caregiver -documenting clinical information in the  electronic or other health record -ordering medications, tests, or procedures -independently interpreting results and communicating results to the patient/ family/caregiver -referring and communicating with other health care professionals  -care coordination

## 2022-09-29 NOTE — Progress Notes (Signed)
TeleHealth Visit:  This visit was completed with telemedicine (audio/video) technology. Annette Saunders has verbally consented to this TeleHealth visit. The patient is located at home, the provider is located at home. The participants in this visit include the listed provider and patient. The visit was conducted today via MyChart video.  OBESITY Annette Saunders is here to discuss her progress with her obesity treatment plan along with follow-up of her obesity related diagnoses.   Today's visit was # 16 Starting weight: 220 lbs Starting date: 11/17/2021 Weight at last in office visit: 198 lbs on 09/12/22 Total weight loss: 22 lbs at last in office visit on 09/12/22. Today's reported weight: 196 lbs    Nutrition Plan: Category 1 Plan with breakfast and lunch options.    Current exercise:   Riding exercise bike for 10 minutes daily.  Interim History:  Annette Saunders reports she is doing well on the category 1 plan.  She is getting in the prescribed protein.   However she is still struggling with nighttime eating. She is traveling to [place] to celebrate her birthday and see family in 4 days. She is consistent with riding her exercise bike.  She is planning on increasing to 15 minutes on the bike daily when she gets to [place].  Her son has a stationary bike.  Assessment/Plan:  1. Eating disorder/emotional eating Bupropion was prescribed at last office visit.  She saw her PCP recently and she reminded her that Elenor Legato had tried that in the past and it did not help her so she did not start the bupropion.  She feels that the topiramate did help her after all and would like to take the medication twice a day.  Nighttime eating is an issue for her.  Plan: New prescription-topiramate 50 mg, take 1 pill in the morning and 2 pills in the evening. Advised of possible adverse effects such as brain fog and paresthesias.   2. Hypertension Hypertension poorly controlled.  Blood pressure was elevated at  recent PCP visit.  Upon review of her blood pressure medications we did not have the correct ones on her list.  She is taking olmesartan 40 mg daily.  This was increased recently from 20 mg daily.  She is also taking hydrochlorothiazide 25 mg daily. Adjustments were made to her medication list.  BP Readings from Last 3 Encounters:  09/12/22 (!) 156/90  06/20/22 120/80  05/16/22 120/78   Lab Results  Component Value Date   CREATININE 1.3 (A) 06/21/2022   CREATININE 1.10 (H) 12/19/2021   CREATININE 1.21 (H) 12/18/2021    Plan: Continue olmesartan 40 mg daily and HCTZ 25 mg daily.  Follow-up with PCP for management.   3.  CKD 3B Patient discussed worsening kidney function with her PCP.  PCP noticed that she was still taking meloxicam prescribed by Ortho.  This was discontinued.  Kidney function will be rechecked on February 22 by her PCP. Currently on ARB.  Plan: Avoid NSAIDs-take Tylenol instead. Continue good water intake.  4. Obesity: Current BMI 37 Annette Saunders is currently in the action stage of change. As such, her goal is to continue with weight loss efforts.  She has agreed to Category 1 Plan with breakfast and lunch options. .   Exercise goals: Increase bike to 15 minutes daily.  Behavioral modification strategies: increasing lean protein intake, decreasing simple carbohydrates, travel eating strategies, and planning for success.  Annette Saunders has agreed to follow-up with our clinic in 3 weeks.   No orders of the defined types  were placed in this encounter.   Medications Discontinued During This Encounter  Medication Reason   buPROPion (WELLBUTRIN SR) 150 MG 12 hr tablet Patient Preference   chlorthalidone (HYGROTON) 25 MG tablet Entry Error     Meds ordered this encounter  Medications   topiramate (TOPAMAX) 50 MG tablet    Sig: Take 1 pill in the am and 2 pills in the evening.    Dispense:  90 tablet    Refill:  0    Order Specific Question:   Supervising Provider     Answer:   Dell Ponto [[id number]]      Objective:   VITALS: Per patient if applicable, see vitals. GENERAL: Alert and in no acute distress. CARDIOPULMONARY: No increased WOB. Speaking in clear sentences.  PSYCH: Pleasant and cooperative. Speech normal rate and rhythm. Affect is appropriate. Insight and judgement are appropriate. Attention is focused, linear, and appropriate.  NEURO: Oriented as arrived to appointment on time with no prompting.   Lab Results  Component Value Date   CREATININE 1.3 (A) 06/21/2022   BUN 30 (A) 06/21/2022   NA 138 06/21/2022   K 4.7 06/21/2022   CL 99 06/21/2022   CO2 24 (A) 06/21/2022   Lab Results  Component Value Date   ALT 24 06/21/2022   AST 23 06/21/2022   ALKPHOS 71 06/21/2022   BILITOT 0.8 12/17/2021   Lab Results  Component Value Date   HGBA1C 5.8 06/21/2022   HGBA1C 6.1 (H) 11/17/2021   Lab Results  Component Value Date   INSULIN 18.2 11/17/2021   Lab Results  Component Value Date   TSH 2.240 12/17/2021   Lab Results  Component Value Date   CHOL 179 06/21/2022   HDL 86 (A) 06/21/2022   LDLCALC 16 06/21/2022   TRIG 90 06/21/2022   CHOLHDL 2.6 11/17/2021   Lab Results  Component Value Date   WBC 7.2 06/21/2022   HGB 12.0 06/21/2022   HCT 36 06/21/2022   MCV 91.9 12/17/2021   PLT 280 06/21/2022   No results found for: "IRON", "TIBC", "FERRITIN" Lab Results  Component Value Date   VD25OH 35.7 11/17/2021   VD25OH 51 06/09/2020   VD25OH 71 10/16/2019    Attestation Statements:   Reviewed by clinician on day of visit: allergies, medications, problem list, medical history, surgical history, family history, social history, and previous encounter notes.

## 2022-09-29 NOTE — Progress Notes (Signed)
Chief Complaint:   OBESITY Annette Saunders is here to discuss her progress with her obesity treatment plan along with follow-up of her obesity related diagnoses. Annette Saunders is on the Category 2 Plan and states she is following her eating plan approximately 0% of the time. Annette Saunders states she is not currently exercising.  Today's visit was #: 14 Starting weight: 220 lbs Starting date: 11/17/2021 Today's weight: 196 lbs Today's date: 09/12/2022 Total lbs lost to date: 24 Total lbs lost since last in-office visit: +4  Interim History: Annette Saunders was in [place] for the holidays and had lots of bread/pasta and other goodies. She is ready to restart her meal plan.  Subjective:   1. Prediabetes Annette Saunders had terrible cravings and nausea and vomiting with Ozempic. Side effects were bad, so she discontinued it after 1 month. Due to CKD, I would not recommend Metformin at this time.  2. Essential hypertension Pt notes she did not take her medication yet this morning. Annette Saunders is asymptomatic and without concerns. She has been regular with meds.  3. Eating disorder/emotional eating Annette Saunders states that Topamax was no help at all, even the higher doses. She stopped taking it after just 2 weeks of being on it.   Assessment/Plan:   Orders Placed This Encounter  Procedures   CBC and differential   CBC   Microalbumin / creatinine urine ratio   Protein / creatinine ratio, urine   Basic metabolic panel   Comprehensive metabolic panel   Lipid panel   Hepatic function panel   Hemoglobin A1c    Medications Discontinued During This Encounter  Medication Reason   topiramate (TOPAMAX) 50 MG tablet      Meds ordered this encounter  Medications   DISCONTD: topiramate (TOPAMAX) 50 MG tablet    Sig: Take 1 tablet (50 mg total) by mouth 2 (two) times daily.    Dispense:  60 tablet    Refill:  0     1. Prediabetes Bring in labs done at [facility] office recently. In the future, depending on renal  function, we may consider additional meds. Continue prudent nutritional plan, increase activity, and weight loss.  2. Essential hypertension BP not at goal today. Check BP at home to goal BP of 140/90 or less. Decrease salt. Follow prudent nutritional plan, weight loss, increase exercise, and continue per PCP.  3. Eating disorder/emotional eating After discussion with pt, she prefers to increase Topamax dose to 50 mg twice a day, and she will restart meal plan. Mindful eating practices reviewed.  4. Obesity with current BMI of 37.5 Annette Saunders is currently in the action stage of change. As such, her goal is to continue with weight loss efforts. She has agreed to the Category 2 Plan with breakfast and lunch options and only 100 snack calories per day.   Pt reports her PCP got "a bunch of labs" 3 months ago. We have requested lab results today and will obtain any remaining needed labs at next OV.  Exercise goals: All adults should avoid inactivity. Some physical activity is better than none, and adults who participate in any amount of physical activity gain some health benefits.  Behavioral modification strategies: increasing lean protein intake, decreasing simple carbohydrates, and planning for success.  Annette Saunders has agreed to follow-up with our clinic in 3 weeks. She was informed of the importance of frequent follow-up visits to maximize her success with intensive lifestyle modifications for her multiple health conditions.   Objective:   Blood pressure (!) 156/90,  pulse 79, temperature 97.7 F (36.5 C), height 5' 1"$  (1.549 m), weight 198 lb 9.6 oz (90.1 kg), SpO2 99 %. Body mass index is 37.53 kg/m.  General: Cooperative, alert, well developed, in no acute distress. HEENT: Conjunctivae and lids unremarkable. Cardiovascular: Regular rhythm.  Lungs: Normal work of breathing. Neurologic: No focal deficits.   Lab Results  Component Value Date   CREATININE 1.3 (A) 06/21/2022   BUN 30 (A)  06/21/2022   NA 138 06/21/2022   K 4.7 06/21/2022   CL 99 06/21/2022   CO2 24 (A) 06/21/2022   Lab Results  Component Value Date   ALT 24 06/21/2022   AST 23 06/21/2022   ALKPHOS 71 06/21/2022   BILITOT 0.8 12/17/2021   Lab Results  Component Value Date   HGBA1C 5.8 06/21/2022   HGBA1C 6.1 (H) 11/17/2021   Lab Results  Component Value Date   INSULIN 18.2 11/17/2021   Lab Results  Component Value Date   TSH 2.240 12/17/2021   Lab Results  Component Value Date   CHOL 179 06/21/2022   HDL 86 (A) 06/21/2022   LDLCALC 16 06/21/2022   TRIG 90 06/21/2022   CHOLHDL 2.6 11/17/2021   Lab Results  Component Value Date   VD25OH 35.7 11/17/2021   VD25OH 51 06/09/2020   VD25OH 71 10/16/2019   Lab Results  Component Value Date   WBC 7.2 06/21/2022   HGB 12.0 06/21/2022   HCT 36 06/21/2022   MCV 91.9 12/17/2021   PLT 280 06/21/2022   Attestation Statements:   Reviewed by clinician on day of visit: allergies, medications, problem list, medical history, surgical history, family history, social history, and previous encounter notes.  Time spent on visit including pre-visit chart review and post-visit care and charting was 30 minutes.   I, Kathlene November, BS, CMA, am acting as transcriptionist for [employer], DO.   I have reviewed the above documentation for accuracy and completeness, and I agree with the above. Marjory Sneddon, D.O.  [place] was signed into law in 2016 which includes the topic of electronic health records.  This provides immediate access to information in MyChart.  This includes consultation notes, operative notes, office notes, lab results and pathology reports.  If you have any questions about what you read please let us know at your next visit so we can discuss your concerns and take corrective action if need be.  We are right here with you.

## 2022-10-03 ENCOUNTER — Telehealth (INDEPENDENT_AMBULATORY_CARE_PROVIDER_SITE_OTHER): Admitting: Family Medicine

## 2022-10-10 ENCOUNTER — Telehealth (INDEPENDENT_AMBULATORY_CARE_PROVIDER_SITE_OTHER): Payer: Medicare Other | Admitting: Family Medicine

## 2022-10-10 ENCOUNTER — Telehealth (INDEPENDENT_AMBULATORY_CARE_PROVIDER_SITE_OTHER): Payer: Self-pay | Admitting: Family Medicine

## 2022-10-10 ENCOUNTER — Encounter (INDEPENDENT_AMBULATORY_CARE_PROVIDER_SITE_OTHER): Payer: Self-pay | Admitting: Family Medicine

## 2022-10-10 DIAGNOSIS — E669 Obesity, unspecified: Secondary | ICD-10-CM | POA: Diagnosis not present

## 2022-10-10 DIAGNOSIS — F509 Eating disorder, unspecified: Secondary | ICD-10-CM | POA: Diagnosis not present

## 2022-10-10 DIAGNOSIS — Z6837 Body mass index (BMI) 37.0-37.9, adult: Secondary | ICD-10-CM | POA: Diagnosis not present

## 2022-10-10 NOTE — Telephone Encounter (Signed)
Patient wanted to schedule a visit.  Visit scheduled for 2 PM today.

## 2022-10-10 NOTE — Progress Notes (Signed)
TeleHealth Visit:  This visit was completed with telemedicine (audio/video) technology. Annette Saunders has verbally consented to this TeleHealth visit. The patient is located at home, the provider is located at home. The participants in this visit include the listed provider and patient. The visit was conducted today via MyChart video.  OBESITY Annette Saunders is here to discuss her progress with her obesity treatment plan along with follow-up of her obesity related diagnoses.   Today's visit was # 17 Starting weight: 220 lbs Starting date: 11/17/2021 Weight at last in office visit: 198 lbs on 09/12/22 Total weight loss: 22 lbs at last in office visit on 09/12/22. Today's reported weight: 198 lbs   Nutrition Plan: Category 1 Plan with breakfast and lunch options.    Current exercise:  Riding exercise bike for 15 minutes daily.  Interim History:  Annette Saunders came back from her trip to [place] early because her knees were bothering her.  Her son has stairs in his house. She is very frustrated with lack of weight loss.  She feels she is adhering to the plan very well.  Her weight has been plateaued since August 2023.  Upon discussion of typical day of food it is evident that she is not getting enough protein.  She has a [place] sandwich for lunch with only two thin slices of [place].  She does not always have the cheese on the sandwich.  She often has special K cereal for breakfast.  She has increased stationary bike riding to 15 minutes/day.  He is drinking more water-about 60 ounces per day.  Assessment/Plan:  1. Eating disorder/emotional eating Annette Saunders has had issues with snacking at night. She has been on topiramate 50 mg in the a.m. and 100 mg at night.  She does not feel that it is helping and she is losing track of how many pills she is taken in the a.m. and the p.m. Has been on bupropion but did not find it helpful.  Plan: Discontinue topiramate. Consider referral to Dr. Mallie Mussel for  next office visit.   2. Obesity: Current BMI 37 Annette Saunders is currently in the action stage of change. As such, her goal is to continue with weight loss efforts.  She has agreed to Category 1 Plan with breakfast and lunch options. .   Exercise goals: as is  Behavioral modification strategies: increasing lean protein intake, planning for success, and keeping a strict food journal.  Annette Saunders has agreed to follow-up with our clinic in 2 weeks.   No orders of the defined types were placed in this encounter.   Medications Discontinued During This Encounter  Medication Reason   topiramate (TOPAMAX) 50 MG tablet Patient Preference     No orders of the defined types were placed in this encounter.     Objective:   VITALS: Per patient if applicable, see vitals. GENERAL: Alert and in no acute distress. CARDIOPULMONARY: No increased WOB. Speaking in clear sentences.  PSYCH: Pleasant and cooperative. Speech normal rate and rhythm. Affect is appropriate. Insight and judgement are appropriate. Attention is focused, linear, and appropriate.  NEURO: Oriented as arrived to appointment on time with no prompting.   Lab Results  Component Value Date   CREATININE 1.3 (A) 06/21/2022   BUN 30 (A) 06/21/2022   NA 138 06/21/2022   K 4.7 06/21/2022   CL 99 06/21/2022   CO2 24 (A) 06/21/2022   Lab Results  Component Value Date   ALT 24 06/21/2022   AST 23 06/21/2022   ALKPHOS 71  06/21/2022   BILITOT 0.8 12/17/2021   Lab Results  Component Value Date   HGBA1C 5.8 06/21/2022   HGBA1C 6.1 (H) 11/17/2021   Lab Results  Component Value Date   INSULIN 18.2 11/17/2021   Lab Results  Component Value Date   TSH 2.240 12/17/2021   Lab Results  Component Value Date   CHOL 179 06/21/2022   HDL 86 (A) 06/21/2022   LDLCALC 16 06/21/2022   TRIG 90 06/21/2022   CHOLHDL 2.6 11/17/2021   Lab Results  Component Value Date   WBC 7.2 06/21/2022   HGB 12.0 06/21/2022   HCT 36 06/21/2022   MCV  91.9 12/17/2021   PLT 280 06/21/2022   No results found for: "IRON", "TIBC", "FERRITIN" Lab Results  Component Value Date   VD25OH 35.7 11/17/2021   VD25OH 51 06/09/2020   VD25OH 71 10/16/2019    Attestation Statements:   Reviewed by clinician on day of visit: allergies, medications, problem list, medical history, surgical history, family history, social history, and previous encounter notes.   This was prepared with the assistance of [employer].  Occasional wrong-word or sound-a-like substitutions may have occurred due to the inherent limitations of voice recognition software.

## 2022-10-10 NOTE — Telephone Encounter (Signed)
2/12 Patient stated that she is un happy with her diet and wants to talk to [facility] about other options. JE

## 2022-10-13 ENCOUNTER — Telehealth (INDEPENDENT_AMBULATORY_CARE_PROVIDER_SITE_OTHER): Payer: Medicare Other | Admitting: Family Medicine

## 2022-10-17 ENCOUNTER — Telehealth (INDEPENDENT_AMBULATORY_CARE_PROVIDER_SITE_OTHER): Payer: Medicare Other | Admitting: Family Medicine

## 2022-10-18 ENCOUNTER — Ambulatory Visit: Admitting: Gastroenterology

## 2022-10-20 DIAGNOSIS — R1031 Right lower quadrant pain: Secondary | ICD-10-CM | POA: Diagnosis not present

## 2022-10-20 DIAGNOSIS — R1030 Lower abdominal pain, unspecified: Secondary | ICD-10-CM | POA: Diagnosis not present

## 2022-10-20 DIAGNOSIS — N1832 Chronic kidney disease, stage 3b: Secondary | ICD-10-CM | POA: Diagnosis not present

## 2022-10-24 ENCOUNTER — Ambulatory Visit (INDEPENDENT_AMBULATORY_CARE_PROVIDER_SITE_OTHER): Admitting: Family Medicine

## 2022-10-24 NOTE — Progress Notes (Unsigned)
TeleHealth Visit:  This visit was completed with telemedicine (audio/video) technology. Annette Saunders has verbally consented to this TeleHealth visit. The patient is located at home, the provider is located at home. The participants in this visit include the listed provider and patient. The visit was conducted today via MyChart video.  OBESITY Annette Saunders is here to discuss her progress with her obesity treatment plan along with follow-up of her obesity related diagnoses.   Today's visit was # 18 Starting weight: 220 lbs Starting date: 11/17/2021 Weight at last in office visit: 198 lbs on 09/12/22 Total weight loss: 22 lbs at last in office visit on 09/12/22. Today's reported weight: 195 lbs   Nutrition Plan: the Category 1 plan and with breakfast and lunch options.  Current exercise: none   Interim History:  She is very frustrated with lack of weight loss.  She lost weight very well and for she for started the program but weight has been plateaued since August 2023. When discussing the food she eats she is still low in protein intake.  She does not snack much.  She finds that 4 ounces of meat at lunch is too much.  She has not been able to exercise because of knee pain.  She sees Dr. Wynelle Link next week.  She would like to switch to all virtual visits because of the distance she lives from [place]. Assessment/Plan:  1. Polyphagia Annette Saunders endorses excessive hunger.  However she is not eating the prescribed amount of protein. Medication(s): None currently. Has been on topiramate and bupropion which were ineffective for her.  Ozempic caused extreme nausea and vomiting.   GFR is 38 so metformin is not an option.  Plan: She will focus on getting 4 ounces of meat at lunch and 6 ounces of meat at dinner. May substitute 2 eggs for 2 ounces of meat.  2. Chronic Kidney Disease Stage 3b Lab results and trends reviewed.  Patient has hypertension. Last GFR was 38 at PCP a few weeks ago.   PCP will be checking this again in 2 weeks.. No results found for: "GFR" Lab Results  Component Value Date   CREATININE 1.3 (A) 06/21/2022   BUN 30 (A) 06/21/2022   NA 138 06/21/2022   K 4.7 06/21/2022   CL 99 06/21/2022   CO2 24 (A) 06/21/2022    Plan: We discussed several lifestyle modifications today and she will continue to work on diet, exercise and weight loss efforts.  Increase water intake. Patient will avoid nephrotoxic medications such as NSAIDs.  Follow-up with PCP.  3. Morbid Obesity: Current BMI 36 Annette Saunders is currently in the action stage of change. As such, her goal is to continue with weight loss efforts.  She has agreed to the Category 1 plan.  1. She will work on getting 4 ounces of meat at lunch and 6 ounces of meat at dinner.  She can divide this up anyway she wants. 2.  She will work on following the plan very closely until her next visit.  Exercise goals: Do arm exercises with hand weights every other day.  Behavioral modification strategies: increasing lean protein intake and planning for success.  Annette Saunders has agreed to follow-up with our clinic in 2 weeks.   No orders of the defined types were placed in this encounter.   Medications Discontinued During This Encounter  Medication Reason   hydrochlorothiazide (HYDRODIURIL) 25 MG tablet Patient Preference   meloxicam (MOBIC) 15 MG tablet Completed Course   Multiple Vitamin (MULTIVITAMIN WITH MINERALS) TABS  tablet Patient Preference   esomeprazole (NEXIUM) 40 MG capsule Patient Preference   cyanocobalamin (VITAMIN B12) 500 MCG tablet Patient Discharge     No orders of the defined types were placed in this encounter.     Objective:   VITALS: Per patient if applicable, see vitals. GENERAL: Alert and in no acute distress. CARDIOPULMONARY: No increased WOB. Speaking in clear sentences.  PSYCH: Pleasant and cooperative. Speech normal rate and rhythm. Affect is appropriate. Insight and judgement are  appropriate. Attention is focused, linear, and appropriate.  NEURO: Oriented as arrived to appointment on time with no prompting.   Attestation Statements:   Reviewed by clinician on day of visit: allergies, medications, problem list, medical history, surgical history, family history, social history, and previous encounter notes.  Time spent on visit including the items listed below was 30 minutes.  -preparing to see the patient (e.g., review of tests, history, previous notes) -obtaining and/or reviewing separately obtained history -counseling and educating the patient/family/caregiver -documenting clinical information in the electronic or other health record -ordering medications, tests, or procedures -independently interpreting results and communicating results to the patient/ family/caregiver -referring and communicating with other health care professionals  -care coordination   This was prepared with the assistance of Presenter, broadcasting.  Occasional wrong-word or sound-a-like substitutions may have occurred due to the inherent limitations of voice recognition software.

## 2022-10-25 ENCOUNTER — Encounter (INDEPENDENT_AMBULATORY_CARE_PROVIDER_SITE_OTHER): Payer: Self-pay | Admitting: Family Medicine

## 2022-10-25 ENCOUNTER — Telehealth (INDEPENDENT_AMBULATORY_CARE_PROVIDER_SITE_OTHER): Payer: Medicare Other | Admitting: Family Medicine

## 2022-10-25 VITALS — Ht 61.0 in | Wt 195.0 lb

## 2022-10-25 DIAGNOSIS — Z6836 Body mass index (BMI) 36.0-36.9, adult: Secondary | ICD-10-CM | POA: Diagnosis not present

## 2022-10-25 DIAGNOSIS — R632 Polyphagia: Secondary | ICD-10-CM | POA: Insufficient documentation

## 2022-10-25 DIAGNOSIS — N1832 Chronic kidney disease, stage 3b: Secondary | ICD-10-CM | POA: Diagnosis not present

## 2022-10-26 ENCOUNTER — Other Ambulatory Visit (HOSPITAL_COMMUNITY): Payer: Self-pay | Admitting: Family Medicine

## 2022-10-26 DIAGNOSIS — R1031 Right lower quadrant pain: Secondary | ICD-10-CM

## 2022-10-27 ENCOUNTER — Encounter: Payer: Self-pay | Admitting: Radiology

## 2022-11-04 ENCOUNTER — Ambulatory Visit (HOSPITAL_COMMUNITY)
Admission: RE | Admit: 2022-11-04 | Discharge: 2022-11-04 | Disposition: A | Discharge status: Home / Self Care | Payer: Medicare Other | Source: Ambulatory Visit | Attending: Family Medicine | Admitting: Family Medicine

## 2022-11-04 DIAGNOSIS — M25861 Other specified joint disorders, right knee: Secondary | ICD-10-CM | POA: Diagnosis not present

## 2022-11-04 DIAGNOSIS — R1031 Right lower quadrant pain: Secondary | ICD-10-CM | POA: Diagnosis not present

## 2022-11-04 DIAGNOSIS — Z96651 Presence of right artificial knee joint: Secondary | ICD-10-CM | POA: Diagnosis not present

## 2022-11-07 NOTE — Progress Notes (Deleted)
  TeleHealth Visit:  This visit was completed with telemedicine (audio/video) technology. Arilyn has verbally consented to this TeleHealth visit. The patient is located at home, the provider is located at home. The participants in this visit include the listed provider and patient. The visit was conducted today via MyChart video.  OBESITY Johnye is here to discuss her progress with her obesity treatment plan along with follow-up of her obesity related diagnoses.    Today's visit was # 19  Starting weight: 220 lbs Starting date: 11/17/2021 Weight reported at last virtual office visit: 195 lbs on 10/25/22 Today's reported weight: *** lbs No weight reported. Weight at clinic on ***: *** lbs Total weight loss: *** Weight change since last visit: ***  Nutrition Plan: the Category 1 plan - ***% adherence.  Current exercise: {exercise types:16438} none  Interim History: ***  Pharmacotherapy: Mayda is on {dwwglp:29109}. Adverse side effects: {dwwse:29122} Hunger is {EWCONTROLASSESSMENT:24261}.  Assessment/Plan:  1. ***  2. ***  3. ***  {dwwmorbid:29108::"Morbid Obesity"}: Current BMI *** Pharmacotherapy Plan {dwwmed:29123} {dwwglp:29109}.  Linetta {CHL AMB IS/IS NOT:210130109} currently in the action stage of change. As such, her goal is to {MWMwtloss#1:210800005}.  She has agreed to {dwwsldiets:29085}.  Exercise goals: {MWM EXERCISE RECS:23473}  Behavioral modification strategies: {dwwslwtlossstrategies:29088}.  Vannesa has agreed to follow-up with our clinic in {NUMBER 1-10:22536} weeks.   No orders of the defined types were placed in this encounter.   There are no discontinued medications.   No orders of the defined types were placed in this encounter.     Objective:   VITALS: Per patient if applicable, see vitals. GENERAL: Alert and in no acute distress. CARDIOPULMONARY: No increased WOB. Speaking in clear sentences.  PSYCH: Pleasant and  cooperative. Speech normal rate and rhythm. Affect is appropriate. Insight and judgement are appropriate. Attention is focused, linear, and appropriate.  NEURO: Oriented as arrived to appointment on time with no prompting.   Attestation Statements:   Reviewed by clinician on day of visit: allergies, medications, problem list, medical history, surgical history, family history, social history, and previous encounter notes.  ***(delete if time-based billing not used) Time spent on visit including the items listed below was *** minutes.  -preparing to see the patient (e.g., review of tests, history, previous notes) -obtaining and/or reviewing separately obtained history -counseling and educating the patient/family/caregiver -documenting clinical information in the electronic or other health record -ordering medications, tests, or procedures -independently interpreting results and communicating results to the patient/ family/caregiver -referring and communicating with other health care professionals  -care coordination    This was prepared with the assistance of Dragon [employer].  Occasional wrong-word or sound-a-like substitutions may have occurred due to the inherent limitations of voice recognition software.

## 2022-11-08 ENCOUNTER — Telehealth (INDEPENDENT_AMBULATORY_CARE_PROVIDER_SITE_OTHER): Admitting: Family Medicine

## 2022-11-14 ENCOUNTER — Ambulatory Visit (INDEPENDENT_AMBULATORY_CARE_PROVIDER_SITE_OTHER): Admitting: Family Medicine

## 2022-11-16 ENCOUNTER — Encounter: Payer: Self-pay | Admitting: Gastroenterology

## 2022-11-16 ENCOUNTER — Ambulatory Visit (INDEPENDENT_AMBULATORY_CARE_PROVIDER_SITE_OTHER): Payer: Medicare Other | Admitting: Gastroenterology

## 2022-11-16 VITALS — BP 150/75 | HR 68 | Temp 98.4°F | Ht 61.0 in | Wt 205.0 lb

## 2022-11-16 DIAGNOSIS — N83209 Unspecified ovarian cyst, unspecified side: Secondary | ICD-10-CM | POA: Diagnosis not present

## 2022-11-16 DIAGNOSIS — D219 Benign neoplasm of connective and other soft tissue, unspecified: Secondary | ICD-10-CM | POA: Diagnosis not present

## 2022-11-16 MED ORDER — CYCLOBENZAPRINE HCL 5 MG PO TABS: 5.0000 mg | ORAL_TABLET | Freq: Three times a day (TID) | ORAL | 0 refills | Status: DC | PRN

## 2022-11-16 NOTE — Patient Instructions (Addendum)
Check out the [facility] app!  You can decrease dicyclomine to discontinue if you notice no worsening of symptoms.  I have sent in flexeril to take up to 3 times a day for the muscle spasms. Please do not drive with this. This medication can cause drowsiness, confusion. Please take only as needed.  I am reaching out to Derrek Monaco, NP, and will get you in to see her as well!   I enjoyed seeing you again today! At our first visit, I mentioned how I value our relationship and want to provide genuine, compassionate, and quality care. You may receive a survey regarding your visit with me, and I welcome your feedback! Thanks so much for taking the time to complete this. I look forward to seeing you again.   Annitta Needs, PhD, ANP-BC [facility]

## 2022-11-16 NOTE — Progress Notes (Unsigned)
Gastroenterology Office Note     Primary Care Physician:  Valentino Nose, FNP  Primary Gastroenterologist: Dr. Abbey Chatters    Chief Complaint   Chief Complaint  Patient presents with   Follow-up    Patient here today for a follow up visit on Gerd. Patient says she is not having any current issues with her Jerrye Bushy. She takes Esomeprazole 40 mg one bid and also Famotidine 20 mg once QHS. Patient says she is having some lower abdominal pains. She had a transvaginal ultrasound done on 11/04/2022, she would like your input.     History of Present Illness   Annette Saunders is a 77 y.o. female presenting today in follow-up with a history of  GERD, dysphagia,BPE April 2019 with prior fundoplication without recurrent hiatal hernia, otherwise normal. 2022 colonoscopy with tubular adenoma. EGD 2022 with benign-appearing esophageal stenosis, s/p dilation, gastritis s/p biopsy, normal duodenum. Biopsy with mild hyperemia, negative H.pylori. Here for routine follow-up.  Chronic lower abdominal discomfort previously evaluated (last CT April 2022)   Right knee acting up. In pain in groin and suprapbuic, consant. A year ago was just right side, now left side. Taking dicyclomine BID but not sure helping. No constipation. Chronic back pain. Feels better walking.  Pelvic US with 1.8 cm right ovarian cyst, small uterine fibroid. She would like to see GYN again.   Nexium BID and pepcid daily. GERD controlled.   Past Medical History:  Diagnosis Date   Anxiety    Back pain    Essential hypertension, benign 06/06/2019   Generalized osteoarthritis    GERD (gastroesophageal reflux disease)    Grade I diastolic dysfunction 99991111   Hyperlipidemia    Hypothyroidism, adult    Insomnia    Joint pain    Lactose intolerance    Lower extremity edema    Malaise and fatigue 06/06/2019   Obesity (BMI 30.0-34.9) 06/06/2019   Stage 3a chronic kidney disease (CKD) ([place]) 12/16/2021    Past Surgical History:   Procedure Laterality Date   BALLOON DILATION N/A 12/08/2020   Procedure: BALLOON DILATION;  Surgeon: Eloise Harman, DO;  Location: [facility];  Service: Endoscopy;  Laterality: N/A;   BIOPSY  12/08/2020   Procedure: BIOPSY;  Surgeon: Eloise Harman, DO;  Location: [facility];  Service: Endoscopy;;  gastric   COLONOSCOPY  10/2014   Dr. Cristine Polio, outside hospital. Colonoscopy performed with Propofol, no polyps, small to medium size internal hemorrhoids noted.    COLONOSCOPY WITH PROPOFOL N/A 12/08/2020   fair prep, non-bleeding internal hemorrhoids, one 2 mm polyp in transverse colon (tubular adenoma)   ECTOPIC PREGNANCY SURGERY     ELBOW FRACTURE SURGERY     ESOPHAGOGASTRODUODENOSCOPY (EGD) WITH PROPOFOL N/A 12/08/2020   benign-appearing esophageal stenosis, s/p dilation, gastritis s/p biopsy, normal duodenum. Biopsy with mild hyperemia, negative H.pylori   HIATAL HERNIA REPAIR     JOINT REPLACEMENT     bilateral knee   LIPOMA EXCISION Right 08/26/2020   Procedure: MINOR EXCISION LIPOMA;THIGH;  Surgeon: Virl Cagey, MD;  Location: [facility];  Service: General;  Laterality: Right;   POLYPECTOMY  12/08/2020   Procedure: POLYPECTOMY;  Surgeon: Eloise Harman, DO;  Location: [facility];  Service: Endoscopy;;   TONSILLECTOMY     TOTAL KNEE REVISION Right 12/15/2021   Procedure: TOTAL KNEE REVISION;  Surgeon: Gaynelle Arabian, MD;  Location: [facility];  Service: Orthopedics;  Laterality: Right;   TUBAL LIGATION      Current Outpatient Medications  Medication Sig Dispense Refill   acetaminophen (TYLENOL) 325 MG tablet Take 650 mg by mouth every 6 (six) hours as needed. QHS     atorvastatin (LIPITOR) 20 MG tablet Take 20 mg by mouth in the morning.     cholecalciferol (VITAMIN D3) 25 MCG (1000 UNIT) tablet Take 1,000 Units by mouth daily.     dicyclomine (BENTYL) 10 MG capsule TAKE 1 CAPSULE(10 MG) BY MOUTH TWICE DAILY AS NEEDED 120 capsule 3   escitalopram (LEXAPRO) 10 MG  tablet TAKE 1 TABLET BY MOUTH EVERY DAY (Patient taking differently: Take 10 mg by mouth at bedtime.) 90 tablet 0   famotidine (PEPCID) 20 MG tablet TAKE 1 TABLET BY MOUTH AT BEDTIME 270 tablet 0   hydrochlorothiazide (HYDRODIURIL) 25 MG tablet Take 25 mg by mouth daily.     olmesartan (BENICAR) 20 MG tablet Take 40 mg by mouth daily.     traZODone (DESYREL) 100 MG tablet Take 1 tablet (100 mg total) by mouth at bedtime as needed for sleep. (Patient taking differently: Take 200 mg by mouth at bedtime as needed for sleep.) 30 tablet 0   No current facility-administered medications for this visit.    Allergies as of 11/16/2022 - Review Complete 11/16/2022  Allergen Reaction Noted   Dilantin [phenytoin sodium extended] Other (See Comments) 03/28/2019   Doxycycline hyclate  12/27/2018   Lactose intolerance (gi)  12/15/2021    Family History  Problem Relation Age of Onset   Colon cancer Neg Hx     Social History   Socioeconomic History   Marital status: Widowed    Spouse name: Not on file   Number of children: Not on file   Years of education: Not on file   Highest education level: Not on file  Occupational History   Occupation: Retired Theme park manager  Tobacco Use   Smoking status: Never   Smokeless tobacco: Never  Vaping Use   Vaping Use: Never used  Substance and Sexual Activity   Alcohol use: No    Alcohol/week: 0.0 standard drinks of alcohol   Drug use: No   Sexual activity: Not Currently    Birth control/protection: Post-menopausal  Other Topics Concern   Not on file  Social History Narrative   Widow for 20 years-husband died of MI.Lives alone.   Social Determinants of Health   Financial Resource Strain: Low Risk  (08/10/2021)   Overall Financial Resource Strain (CARDIA)    Difficulty of Paying Living Expenses: Not hard at all  Food Insecurity: No Food Insecurity (08/10/2021)   Hunger Vital Sign    Worried About Running Out of Food in the Last Year: Never true     Ran Out of Food in the Last Year: Never true  Transportation Needs: No Transportation Needs (08/10/2021)   PRAPARE - Hydrologist (Medical): No    Lack of Transportation (Non-Medical): No  Physical Activity: Inactive (08/10/2021)   Exercise Vital Sign    Days of Exercise per Week: 0 days    Minutes of Exercise per Session: 0 min  Stress: No Stress Concern Present (08/10/2021)   [place]    Feeling of Stress : Not at all  Social Connections: Moderately Isolated (08/10/2021)   Social Connection and Isolation Panel [NHANES]    Frequency of Communication with Friends and Family: More than three times a week    Frequency of Social Gatherings with Friends and Family: Three times a week  Attends Religious Services: 1 to 4 times per year    Active Member of Clubs or Organizations: No    Attends Archivist Meetings: Never    Marital Status: Widowed  Intimate Partner Violence: Not At Risk (08/10/2021)   Humiliation, Afraid, Rape, and Kick questionnaire    Fear of Current or Ex-Partner: No    Emotionally Abused: No    Physically Abused: No    Sexually Abused: No     Review of Systems   Gen: Denies any fever, chills, fatigue, weight loss, lack of appetite.  CV: Denies chest pain, heart palpitations, peripheral edema, syncope.  Resp: Denies shortness of breath at rest or with exertion. Denies wheezing or cough.  GI: Denies dysphagia or odynophagia. Denies jaundice, hematemesis, fecal incontinence. GU : Denies urinary burning, urinary frequency, urinary hesitancy MS: Denies joint pain, muscle weakness, cramps, or limitation of movement.  Derm: Denies rash, itching, dry skin Psych: Denies depression, anxiety, memory loss, and confusion Heme: Denies bruising, bleeding, and enlarged lymph nodes.   Physical Exam   BP (!) 150/67 (BP Location: Left Arm, Patient Position: Sitting, Cuff  Size: Large)   Pulse 75   Temp 98.4 F (36.9 C) (Temporal)   Ht 5\' 1"  (1.549 m)   Wt 205 lb (93 kg)   BMI 38.73 kg/m  General:   Alert and oriented. Pleasant and cooperative. Well-nourished and well-developed.  Head:  Normocephalic and atraumatic. Eyes:  Without icterus Abdomen:  +BS, soft, TTP RLQ/LLQ groin area and non-distended. No HSM noted. No guarding or rebound. No masses appreciated.  Rectal:  Deferred  Msk:  Symmetrical without gross deformities. Normal posture. Extremities:  Without edema. Neurologic:  Alert and  oriented x4;  grossly normal neurologically. Skin:  Intact without significant lesions or rashes. Psych:  Alert and cooperative. Normal mood and affect.   Assessment   Annette Saunders is a 77 y.o. female presenting today in follow-up with a history of GERD, dysphagia, chronic lower abdominal discomfort felt to be musculoskeletal.  GERD: doing well on BID PPI and Pepcid in evenings. No dysphagia. Prior dilation at time of EGD in 2022.   Groin pain and suprapubic pain: for past several years. Prior CT for evaluation. Some improvement historically with lumbar injections. I suspect this is musculoskeletal. Can trial flexeril, as this was prescribed in the past but patient did not pick up. Referral to GYN at patient's request due to ovarian cyst and fibroid, although I do not feel this is contributing.  PLAN    Decrease dicyclomine as not helping symptoms Low dose trial of flexeril; do not drive while taking Referral to GYN   Annitta Needs, PhD, ANP-BC [facility]

## 2022-11-17 ENCOUNTER — Encounter: Payer: Self-pay | Admitting: Gastroenterology

## 2022-11-18 ENCOUNTER — Encounter: Payer: Self-pay | Admitting: Adult Health

## 2022-11-18 ENCOUNTER — Ambulatory Visit (INDEPENDENT_AMBULATORY_CARE_PROVIDER_SITE_OTHER): Payer: Medicare Other | Admitting: Adult Health

## 2022-11-18 VITALS — BP 162/88 | HR 66 | Ht 61.0 in | Wt 208.5 lb

## 2022-11-18 DIAGNOSIS — M1612 Unilateral primary osteoarthritis, left hip: Secondary | ICD-10-CM | POA: Diagnosis not present

## 2022-11-18 DIAGNOSIS — R1032 Left lower quadrant pain: Secondary | ICD-10-CM | POA: Diagnosis not present

## 2022-11-18 DIAGNOSIS — N83201 Unspecified ovarian cyst, right side: Secondary | ICD-10-CM | POA: Diagnosis not present

## 2022-11-18 DIAGNOSIS — Z1231 Encounter for screening mammogram for malignant neoplasm of breast: Secondary | ICD-10-CM | POA: Diagnosis not present

## 2022-11-18 NOTE — Progress Notes (Signed)
  Subjective:     Patient ID: Annette Saunders, female   DOB: July 14, 1946, 77 y.o.   MRN: [id number]  HPI Annette Saunders is a 77 year old white female,widowed, PM in for pelvic pain esp LLQ. She had [place] 11/04/22 showing right ovarian cyst 1.8 cm and small fibroid < 2 cm. She says pain in low abdomen feels better after peeing and pain on left is better after walking around in am and standing but really hurts getting out of bed. She sees Dr Lorre Nick and needs laparoscopic  surgery on right knee and she is seeing [facility] Monday to help her lose weight. She saw Roseanne Kaufman NP at GI earlier, was prescribed flexeril but not helping much.   PCP is Berenda Morale NP  Review of Systems +pelvic pain esp LLQ Reviewed past medical,surgical, social and family history. Reviewed medications and allergies.     Objective:   Physical Exam BP (!) 162/88 (BP Location: Right Arm, Patient Position: Sitting, Cuff Size: Normal)   Pulse 66   Ht 5\' 1"  (1.549 m)   Wt 208 lb 8 oz (94.6 kg)   BMI 39.40 kg/m      Assessment:     1. Osteoarthritis of left hip, unspecified osteoarthritis type The right knee is putting more pressure on left hip  Stay active Weight loss encouraged  Follow up with Dr Lorre Nick   2. LLQ pain Has pain over sacral joint and left hip bursa  Discussed related to OA, and musculoskeletal  Void often  Stay active, try chair yoga  3. Screening mammogram for breast cancer Pt will call for appt in June   4. Cyst of ovary, right Has 1.8 cm cyst, not of concern at this time, no follow up needed      Plan:     Follow up prn

## 2022-11-21 ENCOUNTER — Telehealth (INDEPENDENT_AMBULATORY_CARE_PROVIDER_SITE_OTHER): Payer: Medicare Other | Admitting: Family Medicine

## 2022-11-21 ENCOUNTER — Encounter (INDEPENDENT_AMBULATORY_CARE_PROVIDER_SITE_OTHER): Payer: Self-pay | Admitting: Family Medicine

## 2022-11-21 ENCOUNTER — Encounter: Payer: Self-pay | Admitting: Gastroenterology

## 2022-11-21 VITALS — Ht 61.0 in | Wt 201.0 lb

## 2022-11-21 DIAGNOSIS — R7303 Prediabetes: Secondary | ICD-10-CM | POA: Diagnosis not present

## 2022-11-21 DIAGNOSIS — M25561 Pain in right knee: Secondary | ICD-10-CM

## 2022-11-21 DIAGNOSIS — Z6838 Body mass index (BMI) 38.0-38.9, adult: Secondary | ICD-10-CM

## 2022-11-21 NOTE — Progress Notes (Signed)
TeleHealth Visit:  This visit was completed with telemedicine (audio/video) technology. Annette Saunders has verbally consented to this TeleHealth visit. The patient is located at home, the provider is located at home. The participants in this visit include the listed provider and patient. The visit was conducted today via MyChart video.  OBESITY Annette Saunders is here to discuss her progress with her obesity treatment plan along with follow-up of her obesity related diagnoses.    Today's visit was # 19  Starting weight: 220 lbs Starting date: 11/17/21 Weight reported at last virtual office visit: 195 lbs on 10/25/22 Today's reported weight:  201 lbs Total weight loss: 19 lbs Weight change since last visit: +6  Nutrition Plan: the Category 1 plan - 0% adherence.  Current exercise:  none  Interim History: She says she has been really off plan. Weight is 201 lbs- up 6 lbs since last visit. She says she is very "disgusted" with herself.  She is concerned about gaining her weight back.  Yesterday she did not eat anything till 4 PM.   She went grocery shopping and got healthy groceries. She is "psyched up" to get back on plan tomorrow.   She has downloaded an Research scientist (medical) by [employer] and plans on starting the app.  Has been having right knee pain but says she feels better when she moves.  She is going to [place] with family in July and wants to lose weight by then.  Eating all of the prescribed protein: no Skipping meals: Yes Drinking adequate water: No Drinking sugar sweetened beverages: No Hunger controlled: well controlled. Cravings controlled:  poorly controlled.  Assessment/Plan:  1.  Acute pain of right knee She is status post right total knee replacement April 2023.  She had a recent visit with Dr. Wynelle Link.  He has right knee arthroscopy scheduled for April 23.  She reports she is feels better with activity.  Plan: Exercise as tolerated. Follow-up with Dr. Wynelle Link as  directed.  2. Prediabetes Last A1c was 5.8 on 06/21/2022.,  Down from 6.1 on 11/17/2021. Medication(s): None 06/21/2022-GFR 42.  Metformin contraindicated.  Unable to tolerate Ozempic due to nausea.  Lab Results  Component Value Date   HGBA1C 5.8 06/21/2022   HGBA1C 6.1 (H) 11/17/2021   Lab Results  Component Value Date   INSULIN 18.2 11/17/2021    Plan: Follow category 1 as closely as possible. Begin exercising as directed by [employer].   3. Morbid Obesity: Current BMI 38 Annette Saunders is currently in the action stage of change. As such, her goal is to continue with weight loss efforts.  She has agreed to the Category 1 plan.  1.  Discussed tight adherence to plan-90 to 95%. 2.  Increase water to at least 32 ounces a day.  Limit carbonated beverages to 32 ounces per day.  Exercise goals: [employer] modification strategies: increasing lean protein intake, decreasing simple carbohydrates , no meal skipping, meal planning , increase water intake, planning for success, increasing vegetables, and increasing lower sugar fruits.  Zaniya has agreed to follow-up with our clinic in 2 weeks.   No orders of the defined types were placed in this encounter.   There are no discontinued medications.   No orders of the defined types were placed in this encounter.     Objective:   VITALS: Per patient if applicable, see vitals. GENERAL: Alert and in no acute distress. CARDIOPULMONARY: No increased WOB. Speaking in clear sentences.  PSYCH: Pleasant  and cooperative. Speech normal rate and rhythm. Affect is appropriate. Insight and judgement are appropriate. Attention is focused, linear, and appropriate.  NEURO: Oriented as arrived to appointment on time with no prompting.   Attestation Statements:   Reviewed by clinician on day of visit: allergies, medications, problem list, medical history, surgical history, family history, social history, and previous encounter  notes.  Time spent on visit including the items listed below was 30 minutes.  -preparing to see the patient (e.g., review of tests, history, previous notes) -obtaining and/or reviewing separately obtained history -counseling and educating the patient/family/caregiver -documenting clinical information in the electronic or other health record -ordering medications, tests, or procedures -independently interpreting results and communicating results to the patient/ family/caregiver -referring and communicating with other health care professionals  -care coordination    This was prepared with the assistance of [employer].  Occasional wrong-word or sound-a-like substitutions may have occurred due to the inherent limitations of voice recognition software.

## 2022-12-01 DIAGNOSIS — M25551 Pain in right hip: Secondary | ICD-10-CM | POA: Diagnosis not present

## 2022-12-01 DIAGNOSIS — M25552 Pain in left hip: Secondary | ICD-10-CM | POA: Diagnosis not present

## 2022-12-01 NOTE — Progress Notes (Deleted)
  TeleHealth Visit:  This visit was completed with telemedicine (audio/video) technology. [name] has verbally consented to this TeleHealth visit. The patient is located at home, the provider is located at home. The participants in this visit include the listed provider and patient. The visit was conducted today via MyChart video.  OBESITY Annette Saunders is here to discuss her progress with her obesity treatment plan along with follow-up of her obesity related diagnoses.    Today's visit was # 20  Starting weight: 220 lbs Starting date: 11/17/21 Weight reported at last virtual office visit: 201 lbs on 11/21/22 Today's reported weight (***): {dwwweightreported:29243} Weight at clinic on ***: *** lbs Total weight loss: *** Weight change since last visit: ***  Nutrition Plan: the Category 1 plan - ***% adherence.  Current exercise: {exercise types:16438}  Interim History:  *** Eating all of the prescribed protein: {yes***/no:17258} Skipping meals: {dwwyes:29172} Drinking adequate water: {dwwyes:29172} Drinking sugar sweetened beverages: {dwwyes:29172} Hunger controlled: {EWCONTROLASSESSMENT:24261}. Cravings controlled:  {EWCONTROLASSESSMENT:24261}. Journaling Consistently:  {dwwyes:29172} Meeting protein goals:  {dwwyes:29172} Meeting calorie goals:  {dwwyes:29172}  She is working on {dwwworkingon:29334}  Pharmacotherapy: [name] is on {dwwglp:29109}. Adverse side effects: {dwwse:29122} Hunger is {EWCONTROLASSESSMENT:24261}.  Assessment/Plan:  1. ***  2. ***  3. ***  {dwwmorbid:29108::"Morbid Obesity"}: Current BMI *** Pharmacotherapy Plan {dwwmed:29123} {dwwglp:29109}.  [name] {CHL AMB IS/IS NOT:210130109} currently in the action stage of change. As such, her goal is to {MWMwtloss#1:210800005}.  She has agreed to {dwwsldiets:29085}.  Exercise goals: {MWM EXERCISE RECS:23473}  Behavioral modification strategies: {dwwslwtlossstrategies:29088}.  [name] has  agreed to follow-up with our clinic in {NUMBER 1-10:22536} weeks.   No orders of the defined types were placed in this encounter.   There are no discontinued medications.   No orders of the defined types were placed in this encounter.     Objective:   VITALS: Per patient if applicable, see vitals. GENERAL: Alert and in no acute distress. CARDIOPULMONARY: No increased WOB. Speaking in clear sentences.  PSYCH: Pleasant and cooperative. Speech normal rate and rhythm. Affect is appropriate. Insight and judgement are appropriate. Attention is focused, linear, and appropriate.  NEURO: Oriented as arrived to appointment on time with no prompting.   Attestation Statements:   Reviewed by clinician on day of visit: allergies, medications, problem list, medical history, surgical history, family history, social history, and previous encounter notes.  ***(delete if time-based billing not used) Time spent on visit including the items listed below was *** minutes.  -preparing to see the patient (e.g., review of tests, history, previous notes) -obtaining and/or reviewing separately obtained history -counseling and educating the patient/family/caregiver -documenting clinical information in the electronic or other health record -ordering medications, tests, or procedures -independently interpreting results and communicating results to the patient/ family/caregiver -referring and communicating with other health care professionals  -care coordination    This was prepared with the assistance of Dragon [employer].  Occasional wrong-word or sound-a-like substitutions may have occurred due to the inherent limitations of voice recognition software.

## 2022-12-05 ENCOUNTER — Telehealth (INDEPENDENT_AMBULATORY_CARE_PROVIDER_SITE_OTHER): Admitting: Family Medicine

## 2022-12-08 DIAGNOSIS — M1612 Unilateral primary osteoarthritis, left hip: Secondary | ICD-10-CM | POA: Diagnosis not present

## 2022-12-09 DIAGNOSIS — H43393 Other vitreous opacities, bilateral: Secondary | ICD-10-CM | POA: Diagnosis not present

## 2022-12-09 DIAGNOSIS — H524 Presbyopia: Secondary | ICD-10-CM | POA: Diagnosis not present

## 2022-12-20 DIAGNOSIS — M65861 Other synovitis and tenosynovitis, right lower leg: Secondary | ICD-10-CM | POA: Diagnosis not present

## 2022-12-20 DIAGNOSIS — M659 Synovitis and tenosynovitis, unspecified: Secondary | ICD-10-CM | POA: Diagnosis not present

## 2022-12-20 DIAGNOSIS — Z96651 Presence of right artificial knee joint: Secondary | ICD-10-CM | POA: Diagnosis not present

## 2022-12-26 ENCOUNTER — Telehealth (INDEPENDENT_AMBULATORY_CARE_PROVIDER_SITE_OTHER): Admitting: Family Medicine

## 2023-01-02 DIAGNOSIS — M5459 Other low back pain: Secondary | ICD-10-CM | POA: Diagnosis not present

## 2023-01-02 DIAGNOSIS — M48062 Spinal stenosis, lumbar region with neurogenic claudication: Secondary | ICD-10-CM | POA: Diagnosis not present

## 2023-01-02 DIAGNOSIS — M792 Neuralgia and neuritis, unspecified: Secondary | ICD-10-CM | POA: Diagnosis not present

## 2023-01-03 ENCOUNTER — Other Ambulatory Visit (HOSPITAL_COMMUNITY): Payer: Self-pay | Admitting: Anesthesiology

## 2023-01-03 DIAGNOSIS — M5459 Other low back pain: Secondary | ICD-10-CM

## 2023-01-05 DIAGNOSIS — I10 Essential (primary) hypertension: Secondary | ICD-10-CM | POA: Diagnosis not present

## 2023-01-05 DIAGNOSIS — R1031 Right lower quadrant pain: Secondary | ICD-10-CM | POA: Diagnosis not present

## 2023-01-25 ENCOUNTER — Other Ambulatory Visit: Payer: Self-pay | Admitting: Gastroenterology

## 2023-01-27 ENCOUNTER — Ambulatory Visit (HOSPITAL_COMMUNITY)
Admission: RE | Admit: 2023-01-27 | Discharge: 2023-01-27 | Disposition: A | Discharge status: Home / Self Care | Payer: Medicare Other | Source: Ambulatory Visit | Attending: Anesthesiology | Admitting: Anesthesiology

## 2023-01-27 DIAGNOSIS — M5459 Other low back pain: Secondary | ICD-10-CM | POA: Insufficient documentation

## 2023-01-27 DIAGNOSIS — M5126 Other intervertebral disc displacement, lumbar region: Secondary | ICD-10-CM | POA: Diagnosis not present

## 2023-01-27 DIAGNOSIS — M47816 Spondylosis without myelopathy or radiculopathy, lumbar region: Secondary | ICD-10-CM | POA: Diagnosis not present

## 2023-02-03 ENCOUNTER — Other Ambulatory Visit (HOSPITAL_COMMUNITY)

## 2023-02-08 DIAGNOSIS — I10 Essential (primary) hypertension: Secondary | ICD-10-CM | POA: Diagnosis not present

## 2023-02-08 DIAGNOSIS — E782 Mixed hyperlipidemia: Secondary | ICD-10-CM | POA: Diagnosis not present

## 2023-02-13 DIAGNOSIS — M533 Sacrococcygeal disorders, not elsewhere classified: Secondary | ICD-10-CM | POA: Diagnosis not present

## 2023-02-13 DIAGNOSIS — M792 Neuralgia and neuritis, unspecified: Secondary | ICD-10-CM | POA: Diagnosis not present

## 2023-02-13 DIAGNOSIS — M5459 Other low back pain: Secondary | ICD-10-CM | POA: Diagnosis not present

## 2023-02-16 DIAGNOSIS — M533 Sacrococcygeal disorders, not elsewhere classified: Secondary | ICD-10-CM | POA: Diagnosis not present

## 2023-02-20 DIAGNOSIS — E782 Mixed hyperlipidemia: Secondary | ICD-10-CM | POA: Diagnosis not present

## 2023-02-20 DIAGNOSIS — K219 Gastro-esophageal reflux disease without esophagitis: Secondary | ICD-10-CM | POA: Diagnosis not present

## 2023-02-20 DIAGNOSIS — Z96651 Presence of right artificial knee joint: Secondary | ICD-10-CM | POA: Diagnosis not present

## 2023-02-20 DIAGNOSIS — I129 Hypertensive chronic kidney disease with stage 1 through stage 4 chronic kidney disease, or unspecified chronic kidney disease: Secondary | ICD-10-CM | POA: Diagnosis not present

## 2023-02-20 DIAGNOSIS — K449 Diaphragmatic hernia without obstruction or gangrene: Secondary | ICD-10-CM | POA: Diagnosis not present

## 2023-02-20 DIAGNOSIS — I10 Essential (primary) hypertension: Secondary | ICD-10-CM | POA: Diagnosis not present

## 2023-02-20 DIAGNOSIS — Z96652 Presence of left artificial knee joint: Secondary | ICD-10-CM | POA: Diagnosis not present

## 2023-02-20 DIAGNOSIS — N1831 Chronic kidney disease, stage 3a: Secondary | ICD-10-CM | POA: Diagnosis not present

## 2023-02-20 DIAGNOSIS — R1031 Right lower quadrant pain: Secondary | ICD-10-CM | POA: Diagnosis not present

## 2023-02-23 ENCOUNTER — Other Ambulatory Visit: Payer: Self-pay | Admitting: Gastroenterology

## 2023-03-06 DIAGNOSIS — M533 Sacrococcygeal disorders, not elsewhere classified: Secondary | ICD-10-CM | POA: Diagnosis not present

## 2023-03-06 DIAGNOSIS — M5459 Other low back pain: Secondary | ICD-10-CM | POA: Diagnosis not present

## 2023-03-06 DIAGNOSIS — M792 Neuralgia and neuritis, unspecified: Secondary | ICD-10-CM | POA: Diagnosis not present

## 2023-03-07 DIAGNOSIS — N390 Urinary tract infection, site not specified: Secondary | ICD-10-CM | POA: Diagnosis not present

## 2023-03-07 DIAGNOSIS — R350 Frequency of micturition: Secondary | ICD-10-CM | POA: Diagnosis not present

## 2023-03-20 ENCOUNTER — Other Ambulatory Visit: Payer: Self-pay | Admitting: Gastroenterology

## 2023-03-23 ENCOUNTER — Other Ambulatory Visit: Payer: Self-pay | Admitting: Gastroenterology

## 2023-03-23 DIAGNOSIS — K219 Gastro-esophageal reflux disease without esophagitis: Secondary | ICD-10-CM

## 2023-03-24 DIAGNOSIS — R3129 Other microscopic hematuria: Secondary | ICD-10-CM | POA: Diagnosis not present

## 2023-03-24 DIAGNOSIS — R300 Dysuria: Secondary | ICD-10-CM | POA: Diagnosis not present

## 2023-03-24 DIAGNOSIS — R319 Hematuria, unspecified: Secondary | ICD-10-CM | POA: Diagnosis not present

## 2023-04-10 DIAGNOSIS — R300 Dysuria: Secondary | ICD-10-CM | POA: Diagnosis not present

## 2023-04-10 DIAGNOSIS — G4700 Insomnia, unspecified: Secondary | ICD-10-CM | POA: Diagnosis not present

## 2023-04-11 ENCOUNTER — Ambulatory Visit: Admitting: Gastroenterology

## 2023-04-19 DIAGNOSIS — M47896 Other spondylosis, lumbar region: Secondary | ICD-10-CM | POA: Diagnosis not present

## 2023-04-25 ENCOUNTER — Encounter: Payer: Self-pay | Admitting: Gastroenterology

## 2023-04-25 ENCOUNTER — Ambulatory Visit (INDEPENDENT_AMBULATORY_CARE_PROVIDER_SITE_OTHER): Payer: Medicare Other | Admitting: Gastroenterology

## 2023-04-25 VITALS — BP 126/83 | HR 65 | Temp 98.6°F | Ht 60.0 in | Wt 208.8 lb

## 2023-04-25 DIAGNOSIS — K219 Gastro-esophageal reflux disease without esophagitis: Secondary | ICD-10-CM | POA: Diagnosis not present

## 2023-04-25 DIAGNOSIS — R110 Nausea: Secondary | ICD-10-CM

## 2023-04-25 MED ORDER — ONDANSETRON HCL 4 MG PO TABS: 4.0000 mg | ORAL_TABLET | Freq: Three times a day (TID) | ORAL | 1 refills | Status: DC | PRN

## 2023-04-25 MED ORDER — DICYCLOMINE HCL 10 MG PO CAPS: ORAL_CAPSULE | ORAL | 3 refills | Status: DC

## 2023-04-25 NOTE — Progress Notes (Signed)
Gastroenterology Office Note     Primary Care Physician:  Leone Payor, FNP  Primary Gastroenterologist: Dr. Marletta Lor    Chief Complaint   Chief Complaint  Patient presents with   Follow-up    Follow up on GERD and dysphgia     History of Present Illness   Annette Saunders is a 77 y.o. female presenting today with a history of GERD, dysphagia,BPE April 2019 with prior fundoplication without recurrent hiatal hernia, otherwise normal. 2022 colonoscopy with tubular adenoma. EGD 2022 with benign-appearing esophageal stenosis, s/p dilation, gastritis s/p biopsy, normal duodenum. Biopsy with mild hyperemia, negative H.pylori. Here for routine follow-up.  Chronic lower abdominal discomfort previously evaluated (last CT April 2022)    Nexium BID and Pepcid in evenings. She has been burping more lately at night but eating/drinking before bed. She had been doing well with her diet but then "went off the rails". Once she did this, she noted more nausea described as starting in upper abdomen and into esophagus. Small amount of diarrhea recently.  No overt GI bleeding. She continues to take dicyclomine once a day, as she feels this helps with chronic cramping.   Past Medical History:  Diagnosis Date   Anxiety    Back pain    Essential hypertension, benign 06/06/2019   Generalized osteoarthritis    GERD (gastroesophageal reflux disease)    Grade I diastolic dysfunction 12/16/2021   Hyperlipidemia    Hypothyroidism, adult    Insomnia    Joint pain    Lactose intolerance    Lower extremity edema    Malaise and fatigue 06/06/2019   Obesity (BMI 30.0-34.9) 06/06/2019   Stage 3a chronic kidney disease (CKD) (HCC) 12/16/2021    Past Surgical History:  Procedure Laterality Date   BALLOON DILATION N/A 12/08/2020   Procedure: BALLOON DILATION;  Surgeon: Lanelle Bal, DO;  Location: [facility];  Service: Endoscopy;  Laterality: N/A;   BIOPSY  12/08/2020   Procedure: BIOPSY;   Surgeon: Lanelle Bal, DO;  Location: [facility];  Service: Endoscopy;;  gastric   COLONOSCOPY  10/2014   Dr. Creta Levin, outside hospital. Colonoscopy performed with Propofol, no polyps, small to medium size internal hemorrhoids noted.    COLONOSCOPY WITH PROPOFOL N/A 12/08/2020   fair prep, non-bleeding internal hemorrhoids, one 2 mm polyp in transverse colon (tubular adenoma)   ECTOPIC PREGNANCY SURGERY     ELBOW FRACTURE SURGERY     ESOPHAGOGASTRODUODENOSCOPY (EGD) WITH PROPOFOL N/A 12/08/2020   benign-appearing esophageal stenosis, s/p dilation, gastritis s/p biopsy, normal duodenum. Biopsy with mild hyperemia, negative H.pylori   HIATAL HERNIA REPAIR     JOINT REPLACEMENT     bilateral knee   LIPOMA EXCISION Right 08/26/2020   Procedure: MINOR EXCISION LIPOMA;THIGH;  Surgeon: Lucretia Roers, MD;  Location: [facility];  Service: General;  Laterality: Right;   POLYPECTOMY  12/08/2020   Procedure: POLYPECTOMY;  Surgeon: Lanelle Bal, DO;  Location: [facility];  Service: Endoscopy;;   TONSILLECTOMY     TOTAL KNEE REVISION Right 12/15/2021   Procedure: TOTAL KNEE REVISION;  Surgeon: Ollen Gross, MD;  Location: [facility];  Service: Orthopedics;  Laterality: Right;   TUBAL LIGATION      Current Outpatient Medications  Medication Sig Dispense Refill   atorvastatin (LIPITOR) 20 MG tablet Take 20 mg by mouth in the morning.     dicyclomine (BENTYL) 10 MG capsule TAKE 1 CAPSULE BY MOUTH TWICE DAILY AS NEEDED 120 capsule 0  escitalopram (LEXAPRO) 10 MG tablet TAKE 1 TABLET BY MOUTH EVERY DAY (Patient taking differently: Take 10 mg by mouth at bedtime.) 90 tablet 0   esomeprazole (NEXIUM) 40 MG capsule TAKE 1 CAPSULE BY MOUTH TWICE DAILY BEFORE A MEAL 180 capsule 1   famotidine (PEPCID) 20 MG tablet TAKE 1 TABLET BY MOUTH AT BEDTIME 270 tablet 0   hydrochlorothiazide (HYDRODIURIL) 25 MG tablet Take 25 mg by mouth daily.     olmesartan (BENICAR) 20 MG tablet Take 40 mg by mouth  daily.     acetaminophen (TYLENOL) 325 MG tablet Take 650 mg by mouth every 6 (six) hours as needed. QHS     cholecalciferol (VITAMIN D3) 25 MCG (1000 UNIT) tablet Take 1,000 Units by mouth daily.     cyclobenzaprine (FLEXERIL) 5 MG tablet Take 1 tablet (5 mg total) by mouth 3 (three) times daily as needed for muscle spasms. (Patient not taking: Reported on 11/18/2022) 30 tablet 0   traZODone (DESYREL) 100 MG tablet Take 1 tablet (100 mg total) by mouth at bedtime as needed for sleep. (Patient taking differently: Take 200 mg by mouth at bedtime as needed for sleep.) 30 tablet 0   No current facility-administered medications for this visit.    Allergies as of 04/25/2023 - Review Complete 04/25/2023  Allergen Reaction Noted   Dilantin [phenytoin sodium extended] Other (See Comments) 03/28/2019   Doxycycline hyclate  12/27/2018   Lactose intolerance (gi)  12/15/2021    Family History  Problem Relation Age of Onset   Colon cancer Neg Hx     Social History   Socioeconomic History   Marital status: Widowed    Spouse name: Not on file   Number of children: Not on file   Years of education: Not on file   Highest education level: Not on file  Occupational History   Occupation: Retired Interior and spatial designer  Tobacco Use   Smoking status: Never   Smokeless tobacco: Never  Vaping Use   Vaping status: Never Used  Substance and Sexual Activity   Alcohol use: No    Alcohol/week: 0.0 standard drinks of alcohol   Drug use: No   Sexual activity: Not Currently    Birth control/protection: Post-menopausal, Abstinence, Surgical    Comment: tubal  Other Topics Concern   Not on file  Social History Narrative   Widow for 25 years-husband died of MI.Lives alone.   Social Determinants of Health   Financial Resource Strain: Low Risk  (11/18/2022)   Overall Financial Resource Strain (CARDIA)    Difficulty of Paying Living Expenses: Not hard at all  Food Insecurity: No Food Insecurity (11/18/2022)    Hunger Vital Sign    Worried About Running Out of Food in the Last Year: Never true    Ran Out of Food in the Last Year: Never true  Transportation Needs: No Transportation Needs (11/18/2022)   PRAPARE - Administrator, Civil Service (Medical): No    Lack of Transportation (Non-Medical): No  Physical Activity: Inactive (11/18/2022)   Exercise Vital Sign    Days of Exercise per Week: 0 days    Minutes of Exercise per Session: 0 min  Stress: No Stress Concern Present (11/18/2022)   [employer] of Occupational Health - Occupational Stress Questionnaire    Feeling of Stress : Not at all  Social Connections: Socially Isolated (11/18/2022)   Social Connection and Isolation Panel [NHANES]    Frequency of Communication with Friends and Family: More than three times a  week    Frequency of Social Gatherings with Friends and Family: Once a week    Attends Religious Services: Never    Database administrator or Organizations: No    Attends Banker Meetings: Never    Marital Status: Widowed  Intimate Partner Violence: Not At Risk (11/18/2022)   Humiliation, Afraid, Rape, and Kick questionnaire    Fear of Current or Ex-Partner: No    Emotionally Abused: No    Physically Abused: No    Sexually Abused: No     Review of Systems   Gen: Denies any fever, chills, fatigue, weight loss, lack of appetite.  CV: Denies chest pain, heart palpitations, peripheral edema, syncope.  Resp: Denies shortness of breath at rest or with exertion. Denies wheezing or cough.  GI: Denies dysphagia or odynophagia. Denies jaundice, hematemesis, fecal incontinence. GU : Denies urinary burning, urinary frequency, urinary hesitancy MS: Denies joint pain, muscle weakness, cramps, or limitation of movement.  Derm: Denies rash, itching, dry skin Psych: Denies depression, anxiety, memory loss, and confusion Heme: Denies bruising, bleeding, and enlarged lymph nodes.   Physical Exam   BP 126/83    Pulse 65   Temp 98.6 F (37 C)   Ht 5' (1.524 m)   Wt 208 lb 12.8 oz (94.7 kg)   BMI 40.78 kg/m  General:   Alert and oriented. Pleasant and cooperative. Well-nourished and well-developed.  Head:  Normocephalic and atraumatic. Eyes:  Without icterus Abdomen:  +BS, soft, non-tender and non-distended. No HSM noted. No guarding or rebound. No masses appreciated.  Rectal:  Deferred  Msk:  Symmetrical without gross deformities. Normal posture. Extremities:  Without edema. Neurologic:  Alert and  oriented x4;  grossly normal neurologically. Skin:  Intact without significant lesions or rashes. Psych:  Alert and cooperative. Normal mood and affect.   Assessment   Annette Saunders is a 77 y.o. female presenting today with a history of  GERD, dysphagia,BPE April 2019 with prior fundoplication without recurrent hiatal hernia, otherwise normal. 2022 colonoscopy with tubular adenoma. EGD 2022 with benign-appearing esophageal stenosis, s/p dilation, gastritis s/p biopsy, normal duodenum. Biopsy with mild hyperemia, negative H.pylori. Here for routine follow-up.  Chronic lower abdominal discomfort previously evaluated (last CT April 2022)   GERD: mild exacerbation recently in light of diet. She will resume appropriate diet changes and continue Nexium BID and pepcid in evening. Zofran for nausea. If this continues, she is to call. I suspect this was related to diet intake recently.     PLAN    Continue PPI BID Zofran prn Call if no improvement Return in Jan 2025   Gelene Mink, PhD, [facility]

## 2023-04-25 NOTE — Patient Instructions (Signed)
I have sent in Zofran to take every 8 hours as needed for any nausea.  I feel you will feel much better once getting back on track with eating! Proud of you!  I will see you in January, but please let me know if you continue to have symptoms!  I enjoyed seeing you again today! I value our relationship and want to provide genuine, compassionate, and quality care. You may receive a survey regarding your visit with me, and I welcome your feedback! Thanks so much for taking the time to complete this. I look forward to seeing you again.      Gelene Mink, PhD, ANP-BC [facility]

## 2023-05-13 ENCOUNTER — Ambulatory Visit
Admission: RE | Admit: 2023-05-13 | Discharge: 2023-05-13 | Disposition: A | Discharge status: Home / Self Care | Payer: Medicare Other | Source: Ambulatory Visit | Attending: Nurse Practitioner | Admitting: Nurse Practitioner

## 2023-05-13 VITALS — BP 117/79 | HR 83 | Temp 97.5°F | Resp 18

## 2023-05-13 DIAGNOSIS — L304 Erythema intertrigo: Secondary | ICD-10-CM

## 2023-05-13 MED ORDER — NYSTATIN 100000 UNIT/GM EX CREA: TOPICAL_CREAM | CUTANEOUS | 0 refills | Status: AC

## 2023-05-13 MED ORDER — TRIAMCINOLONE ACETONIDE 0.1 % EX CREA: 1.0000 | TOPICAL_CREAM | Freq: Two times a day (BID) | CUTANEOUS | 0 refills | Status: DC

## 2023-05-13 MED ORDER — NYSTATIN 100000 UNIT/GM EX POWD: 1.0000 | Freq: Three times a day (TID) | CUTANEOUS | 0 refills | Status: AC

## 2023-05-13 MED ORDER — CEPHALEXIN 500 MG PO CAPS: 500.0000 mg | ORAL_CAPSULE | Freq: Four times a day (QID) | ORAL | 0 refills | Status: AC

## 2023-05-13 NOTE — Discharge Instructions (Signed)
Take medication as prescribed. Keep the skin clean and dry.  Make sure you dry well under the breasts after showering. Do not wear tight clothes. Wear clothes that are loose, absorbent, and made of cotton. Wear a bra that gives good support, if needed. Avoid hot baths or showers.  Recommend lukewarm baths or showers. Apply cool compresses to the affected area to help with itching and redness. Avoid scratching or manipulating the areas while symptoms persist. If symptoms do not improve with this treatment, please follow-up with your primary care physician for further evaluation. Follow-up as needed.

## 2023-05-13 NOTE — ED Triage Notes (Signed)
Rash on left and right side under bra area.  Sates has tried corn starch and Desitin without relief.  States areas itch and burn.

## 2023-05-13 NOTE — ED Provider Notes (Signed)
[facility]    CSN: [id number] Arrival date & time: 05/13/23  1241      History   Chief Complaint Chief Complaint  Patient presents with   Rash    Right under my breast where the bra is it's burning even if I touch it I put cornstarch and that didn't work I used Systems analyst and dosnt work - Entered by patient    HPI Annette Saunders is a 77 y.o. female.   The history is provided by the patient.   Patient presents for complaints of rash under the bilateral breasts is been present for the past several days.  Patient states that the area "itches and burns".  She denies exposure to new soaps, medications, foods, plants, detergents, or body washes.  Patient reports she has been using cornstarch and Desitin to the area with minimal relief.  Patient reports history of same approximately 2 years ago. Past Medical History:  Diagnosis Date   Anxiety    Back pain    Essential hypertension, benign 06/06/2019   Generalized osteoarthritis    GERD (gastroesophageal reflux disease)    Grade I diastolic dysfunction 12/16/2021   Hyperlipidemia    Hypothyroidism, adult    Insomnia    Joint pain    Lactose intolerance    Lower extremity edema    Malaise and fatigue 06/06/2019   Obesity (BMI 30.0-34.9) 06/06/2019   Stage 3a chronic kidney disease (CKD) (HCC) 12/16/2021    Patient Active Problem List   Diagnosis Date Noted   Nausea without vomiting 04/25/2023   LLQ pain 11/18/2022   Osteoarthritis of left hip 11/18/2022   Screening mammogram for breast cancer 11/18/2022   Cyst of ovary, right 11/18/2022   Polyphagia 10/25/2022   Essential hypertension 09/12/2022   Chronic kidney disease, stage 3b (HCC) 07/25/2022   Eating disorder 07/25/2022   Morbid obesity (HCC) 07/25/2022   Medication management 05/18/2022   Prediabetes 05/18/2022   Acute postoperative anemia due to expected blood loss 12/17/2021   Acute renal failure superimposed on stage 3a chronic kidney disease (HCC)  12/16/2021   Hyperkalemia 12/16/2021   Normocytic anemia 12/16/2021   Hyponatremia 12/16/2021   Stage 3a chronic kidney disease (CKD) (HCC) 12/16/2021   Grade I diastolic dysfunction 12/16/2021   Failed total knee arthroplasty (HCC) 12/15/2021   Chronic RLQ pain 08/10/2021   Pain of muscle of abdomen 08/10/2021   Generalized anxiety disorder 07/02/2021   Mixed hyperlipidemia 05/24/2021   Chronic insomnia 05/05/2021   Benign lipomatous neoplasm of skin, subcu of right leg    Lipoma 08/18/2020   Fatigue 06/09/2020   Vitamin D deficiency 06/09/2020   Epigastric pain    Non-intractable vomiting    SBO (small bowel obstruction) (HCC)    Class 2 obesity due to excess calories with body mass index (BMI) of 37.0 to 37.9 in adult    Ileus (HCC) 10/31/2019   Shortness of breath 10/16/2019   Encounter to discuss test results 10/16/2019   Renal insufficiency 10/16/2019   Healthcare maintenance 10/16/2019   Otitis media 10/16/2019   Breast pain 10/16/2019   Abdominal pain 09/17/2019   Essential hypertension, benign 06/06/2019   Malaise and fatigue 06/06/2019   Obesity (BMI 30.0-34.9) 06/06/2019   Hypothyroidism, adult 06/06/2019   Diarrhea 03/28/2019   Pain due to total right knee replacement (HCC) 05/21/2018   Dysphagia 12/14/2017   GERD (gastroesophageal reflux disease) 10/27/2016    Past Surgical History:  Procedure Laterality Date   BALLOON DILATION N/A 12/08/2020  Procedure: BALLOON DILATION;  Surgeon: Lanelle Bal, DO;  [facility];  Service: Endoscopy;  Laterality: N/A;   BIOPSY  12/08/2020   Procedure: BIOPSY;  Surgeon: Lanelle Bal, DO;  [facility]: [facility];  Service: Endoscopy;;  gastric   COLONOSCOPY  10/2014   Dr. Creta Levin, outside hospital. Colonoscopy performed with Propofol, no polyps, small to medium size internal hemorrhoids noted.    COLONOSCOPY WITH PROPOFOL N/A 12/08/2020   fair prep, non-bleeding internal hemorrhoids, one 2 mm polyp in  transverse colon (tubular adenoma)   ECTOPIC PREGNANCY SURGERY     ELBOW FRACTURE SURGERY     ESOPHAGOGASTRODUODENOSCOPY (EGD) WITH PROPOFOL N/A 12/08/2020   benign-appearing esophageal stenosis, s/p dilation, gastritis s/p biopsy, normal duodenum. Biopsy with mild hyperemia, negative H.pylori   HIATAL HERNIA REPAIR     JOINT REPLACEMENT     bilateral knee   LIPOMA EXCISION Right 08/26/2020   Procedure: MINOR EXCISION LIPOMA;THIGH;  Surgeon: Lucretia Roers, MD;  Location: [facility];  Service: General;  Laterality: Right;   POLYPECTOMY  12/08/2020   Procedure: POLYPECTOMY;  Surgeon: Lanelle Bal, DO;  [facility];  Service: Endoscopy;;   TONSILLECTOMY     TOTAL KNEE REVISION Right 12/15/2021   Procedure: TOTAL KNEE REVISION;  Surgeon: Ollen Gross, MD;  Location: [facility];  Service: Orthopedics;  Laterality: Right;   TUBAL LIGATION      OB History     Gravida  13   Para  1   Term  1   Preterm      AB  12   Living  1      SAB  11   IAB      Ectopic  1   Multiple      Live Births  1            Home Medications    Prior to Admission medications   Medication Sig Start Date End Date Taking? Authorizing Provider  cephALEXin (KEFLEX) 500 MG capsule Take 1 capsule (500 mg total) by mouth 4 (four) times daily for 5 days. 05/13/23 05/18/23 Yes Shakeya Kerkman-Warren, Sadie Haber, NP  nystatin (MYCOSTATIN/NYSTOP) powder Apply 1 Application topically 3 (three) times daily. 05/13/23  Yes Grayce Budden-Warren, Sadie Haber, NP  nystatin cream (MYCOSTATIN) Apply to affected area 2 times daily 05/13/23  Yes Angelie Kram-Warren, Sadie Haber, NP  triamcinolone cream (KENALOG) 0.1 % Apply 1 Application topically 2 (two) times daily. 05/13/23  Yes Ira Dougher-Warren, Sadie Haber, NP  atorvastatin (LIPITOR) 20 MG tablet Take 20 mg by mouth in the morning.    [provider]  dicyclomine (BENTYL) 10 MG capsule TAKE 1 CAPSULE BY MOUTH TWICE DAILY AS NEEDED 04/25/23   Gelene Mink, NP   escitalopram (LEXAPRO) 10 MG tablet TAKE 1 TABLET BY MOUTH EVERY DAY Patient taking differently: Take 10 mg by mouth at bedtime. 03/11/21   Wilson Singer, MD  esomeprazole (NEXIUM) 40 MG capsule TAKE 1 CAPSULE BY MOUTH TWICE DAILY BEFORE A MEAL 03/29/23   Gelene Mink, NP  famotidine (PEPCID) 20 MG tablet TAKE 1 TABLET BY MOUTH AT BEDTIME 02/23/23   Gelene Mink, NP  hydrochlorothiazide (HYDRODIURIL) 25 MG tablet Take 25 mg by mouth daily.    [provider]  olmesartan (BENICAR) 20 MG tablet Take 40 mg by mouth daily.    [provider]  ondansetron (ZOFRAN) 4 MG tablet Take 1 tablet (4 mg total) by mouth every 8 (eight) hours as needed for  nausea or vomiting. 04/25/23   Gelene Mink, NP    Family History Family History  Problem Relation Age of Onset   Colon cancer Neg Hx     Social History Social History   Tobacco Use   Smoking status: Never   Smokeless tobacco: Never  Vaping Use   Vaping status: Never Used  Substance Use Topics   Alcohol use: No    Alcohol/week: 0.0 standard drinks of alcohol   Drug use: No     Allergies   Dilantin [phenytoin sodium extended], Doxycycline hyclate, and Lactose intolerance (gi)   Review of Systems Review of Systems Per HPI  Physical Exam Triage Vital Signs ED Triage Vitals  Encounter Vitals Group     BP 05/13/23 1304 117/79     Systolic BP Percentile --      Diastolic BP Percentile --      Pulse Rate 05/13/23 1304 83     Resp 05/13/23 1304 18     Temp 05/13/23 1304 (!) 97.5 F (36.4 C)     Temp Source 05/13/23 1304 Oral     SpO2 05/13/23 1304 95 %     Weight --      Height --      Head Circumference --      Peak Flow --      Pain Score 05/13/23 1305 0     Pain Loc --      Pain Education --      Exclude from Growth Chart --    No data found.  Updated Vital Signs BP 117/79 (BP Location: Right Arm)   Pulse 83   Temp (!) 97.5 F (36.4 C) (Oral)   Resp 18   SpO2 95%   Visual Acuity Right Eye  Distance:   Left Eye Distance:   Bilateral Distance:    Right Eye Near:   Left Eye Near:    Bilateral Near:     Physical Exam Vitals and nursing note reviewed.  Constitutional:      General: She is not in acute distress.    Appearance: Normal appearance.  HENT:     Head: Normocephalic.  Eyes:     Extraocular Movements: Extraocular movements intact.     Pupils: Pupils are equal, round, and reactive to light.  Pulmonary:     Effort: Pulmonary effort is normal.  Skin:    General: Skin is warm and dry.     Findings: Rash present.     Comments: Erythematous patches noted under the bilateral breast.  Areas contain papules, with some maceration present.  Rashes noted under the skin folds of the bilateral breasts.  Areas are tender to palpation.  Neurological:     General: No focal deficit present.     Mental Status: She is alert and oriented to person, place, and time.  Psychiatric:        Mood and Affect: Mood normal.        Behavior: Behavior normal.      UC Treatments / Results  Labs (all labs ordered are listed, but only abnormal results are displayed) Labs Reviewed - No data to display  EKG   Radiology No results found.  Procedures Procedures (including critical care time)  Medications Ordered in UC Medications - No data to display  Initial Impression / Assessment and Plan / UC Course  I have reviewed the triage vital signs and the nursing notes.  Pertinent labs & imaging results that were available during my care of  the patient were reviewed by me and considered in my medical decision making (see chart for details).  The patient is well-appearing, she is in no acute distress, vital signs are stable.  Symptoms consistent with intertrigo.  Will provide symptomatic treatment with triamcinolone cream 0.1%, and nystatin cream for patient to apply simultaneously to help with symptoms.  Patient was also prescribed nystatin powder to apply for prophylaxis.  Keflex 500  mg prescribed to treat for possible skin infection.  Supportive care recommendations were provided and discussed with the patient to include over-the-counter analgesics, use of cool compresses, and avoiding hot baths or showers.  Patient was also advised to wear loose clothing.  Patient was advised to follow-up with her primary care physician if symptoms do not improve with this treatment.  Patient was in agreement with this plan of care and verbalizes understanding.  All questions were answered.  Patient stable for discharge.  Final Clinical Impressions(s) / UC Diagnoses   Final diagnoses:  Intertrigo     Discharge Instructions      Take medication as prescribed. Keep the skin clean and dry.  Make sure you dry well under the breasts after showering. Do not wear tight clothes. Wear clothes that are loose, absorbent, and made of cotton. Wear a bra that gives good support, if needed. Avoid hot baths or showers.  Recommend lukewarm baths or showers. Apply cool compresses to the affected area to help with itching and redness. Avoid scratching or manipulating the areas while symptoms persist. If symptoms do not improve with this treatment, please follow-up with your primary care physician for further evaluation. Follow-up as needed.     ED Prescriptions     Medication Sig Dispense Auth. Provider   cephALEXin (KEFLEX) 500 MG capsule Take 1 capsule (500 mg total) by mouth 4 (four) times daily for 5 days. 20 capsule Brailen Macneal-Warren, Sadie Haber, NP   triamcinolone cream (KENALOG) 0.1 % Apply 1 Application topically 2 (two) times daily. 80 g Bluford Sedler-Warren, Sadie Haber, NP   nystatin cream (MYCOSTATIN) Apply to affected area 2 times daily 60 g Baila Rouse-Warren, Sadie Haber, NP   nystatin (MYCOSTATIN/NYSTOP) powder Apply 1 Application topically 3 (three) times daily. 60 g Alya Smaltz-Warren, Sadie Haber, NP      PDMP not reviewed this encounter.   Abran Cantor, NP 05/13/23 1345

## 2023-06-18 ENCOUNTER — Ambulatory Visit: Admission: RE | Admit: 2023-06-18 | Source: Ambulatory Visit | Attending: Family Medicine | Admitting: Family Medicine

## 2023-06-20 DIAGNOSIS — E782 Mixed hyperlipidemia: Secondary | ICD-10-CM | POA: Diagnosis not present

## 2023-06-20 DIAGNOSIS — R7301 Impaired fasting glucose: Secondary | ICD-10-CM | POA: Diagnosis not present

## 2023-06-20 DIAGNOSIS — A084 Viral intestinal infection, unspecified: Secondary | ICD-10-CM | POA: Diagnosis not present

## 2023-06-20 DIAGNOSIS — N1831 Chronic kidney disease, stage 3a: Secondary | ICD-10-CM | POA: Diagnosis not present

## 2023-06-23 DIAGNOSIS — N1831 Chronic kidney disease, stage 3a: Secondary | ICD-10-CM | POA: Diagnosis not present

## 2023-06-23 DIAGNOSIS — K449 Diaphragmatic hernia without obstruction or gangrene: Secondary | ICD-10-CM | POA: Diagnosis not present

## 2023-06-23 DIAGNOSIS — I129 Hypertensive chronic kidney disease with stage 1 through stage 4 chronic kidney disease, or unspecified chronic kidney disease: Secondary | ICD-10-CM | POA: Diagnosis not present

## 2023-06-23 DIAGNOSIS — K219 Gastro-esophageal reflux disease without esophagitis: Secondary | ICD-10-CM | POA: Diagnosis not present

## 2023-06-23 DIAGNOSIS — E782 Mixed hyperlipidemia: Secondary | ICD-10-CM | POA: Diagnosis not present

## 2023-06-23 DIAGNOSIS — Z96651 Presence of right artificial knee joint: Secondary | ICD-10-CM | POA: Diagnosis not present

## 2023-06-23 DIAGNOSIS — Z23 Encounter for immunization: Secondary | ICD-10-CM | POA: Diagnosis not present

## 2023-06-23 DIAGNOSIS — I10 Essential (primary) hypertension: Secondary | ICD-10-CM | POA: Diagnosis not present

## 2023-06-23 DIAGNOSIS — Z96652 Presence of left artificial knee joint: Secondary | ICD-10-CM | POA: Diagnosis not present

## 2023-08-09 DIAGNOSIS — Z713 Dietary counseling and surveillance: Secondary | ICD-10-CM | POA: Diagnosis not present

## 2023-08-09 DIAGNOSIS — Z79899 Other long term (current) drug therapy: Secondary | ICD-10-CM | POA: Diagnosis not present

## 2023-08-09 DIAGNOSIS — Z7182 Exercise counseling: Secondary | ICD-10-CM | POA: Diagnosis not present

## 2023-08-16 DIAGNOSIS — M47816 Spondylosis without myelopathy or radiculopathy, lumbar region: Secondary | ICD-10-CM | POA: Diagnosis not present

## 2023-08-31 DIAGNOSIS — Z5189 Encounter for other specified aftercare: Secondary | ICD-10-CM | POA: Diagnosis not present

## 2023-09-05 ENCOUNTER — Ambulatory Visit: Admitting: Gastroenterology

## 2023-09-26 DIAGNOSIS — M6281 Muscle weakness (generalized): Secondary | ICD-10-CM | POA: Diagnosis not present

## 2023-09-26 DIAGNOSIS — M25561 Pain in right knee: Secondary | ICD-10-CM | POA: Diagnosis not present

## 2023-10-02 DIAGNOSIS — N189 Chronic kidney disease, unspecified: Secondary | ICD-10-CM | POA: Diagnosis not present

## 2023-10-19 ENCOUNTER — Ambulatory Visit: Admitting: Gastroenterology

## 2023-10-19 ENCOUNTER — Encounter (INDEPENDENT_AMBULATORY_CARE_PROVIDER_SITE_OTHER): Payer: Self-pay

## 2023-10-31 ENCOUNTER — Ambulatory Visit: Admitting: Gastroenterology

## 2023-11-01 ENCOUNTER — Other Ambulatory Visit: Payer: Self-pay | Admitting: Gastroenterology

## 2023-11-03 ENCOUNTER — Other Ambulatory Visit: Payer: Self-pay | Admitting: Gastroenterology

## 2023-11-04 ENCOUNTER — Other Ambulatory Visit: Payer: Self-pay | Admitting: Gastroenterology

## 2023-11-04 DIAGNOSIS — K219 Gastro-esophageal reflux disease without esophagitis: Secondary | ICD-10-CM

## 2023-11-07 DIAGNOSIS — E782 Mixed hyperlipidemia: Secondary | ICD-10-CM | POA: Diagnosis not present

## 2023-11-07 DIAGNOSIS — N1831 Chronic kidney disease, stage 3a: Secondary | ICD-10-CM | POA: Diagnosis not present

## 2023-11-10 DIAGNOSIS — Z79899 Other long term (current) drug therapy: Secondary | ICD-10-CM | POA: Diagnosis not present

## 2023-11-10 DIAGNOSIS — M533 Sacrococcygeal disorders, not elsewhere classified: Secondary | ICD-10-CM | POA: Diagnosis not present

## 2023-11-10 DIAGNOSIS — M5459 Other low back pain: Secondary | ICD-10-CM | POA: Diagnosis not present

## 2023-11-10 DIAGNOSIS — M792 Neuralgia and neuritis, unspecified: Secondary | ICD-10-CM | POA: Diagnosis not present

## 2023-11-11 ENCOUNTER — Other Ambulatory Visit: Payer: Self-pay | Admitting: Gastroenterology

## 2023-11-13 DIAGNOSIS — Z0000 Encounter for general adult medical examination without abnormal findings: Secondary | ICD-10-CM | POA: Diagnosis not present

## 2023-11-13 DIAGNOSIS — I10 Essential (primary) hypertension: Secondary | ICD-10-CM | POA: Diagnosis not present

## 2023-11-13 DIAGNOSIS — K449 Diaphragmatic hernia without obstruction or gangrene: Secondary | ICD-10-CM | POA: Diagnosis not present

## 2023-11-13 DIAGNOSIS — I129 Hypertensive chronic kidney disease with stage 1 through stage 4 chronic kidney disease, or unspecified chronic kidney disease: Secondary | ICD-10-CM | POA: Diagnosis not present

## 2023-11-13 DIAGNOSIS — R1030 Lower abdominal pain, unspecified: Secondary | ICD-10-CM | POA: Diagnosis not present

## 2023-11-13 DIAGNOSIS — E782 Mixed hyperlipidemia: Secondary | ICD-10-CM | POA: Diagnosis not present

## 2023-11-13 DIAGNOSIS — K219 Gastro-esophageal reflux disease without esophagitis: Secondary | ICD-10-CM | POA: Diagnosis not present

## 2023-11-13 DIAGNOSIS — N1831 Chronic kidney disease, stage 3a: Secondary | ICD-10-CM | POA: Diagnosis not present

## 2023-11-21 DIAGNOSIS — M47817 Spondylosis without myelopathy or radiculopathy, lumbosacral region: Secondary | ICD-10-CM | POA: Diagnosis not present

## 2023-12-06 ENCOUNTER — Ambulatory Visit: Admitting: Gastroenterology

## 2023-12-13 ENCOUNTER — Other Ambulatory Visit: Payer: Self-pay | Admitting: Gastroenterology

## 2023-12-19 ENCOUNTER — Ambulatory Visit: Admitting: Gastroenterology

## 2023-12-20 ENCOUNTER — Telehealth: Payer: Self-pay | Admitting: Gastroenterology

## 2023-12-20 NOTE — Telephone Encounter (Signed)
 Patient cancelled her 4/22 appt called and wanted to reschedule her appt I called pt back to reschedule her appt and left her a voicemail.

## 2023-12-27 ENCOUNTER — Ambulatory Visit (INDEPENDENT_AMBULATORY_CARE_PROVIDER_SITE_OTHER): Admitting: Gastroenterology

## 2023-12-27 ENCOUNTER — Encounter: Payer: Self-pay | Admitting: Gastroenterology

## 2023-12-27 VITALS — BP 111/75 | HR 74 | Temp 97.5°F | Ht 60.0 in | Wt 204.0 lb

## 2023-12-27 DIAGNOSIS — K219 Gastro-esophageal reflux disease without esophagitis: Secondary | ICD-10-CM | POA: Diagnosis not present

## 2023-12-27 DIAGNOSIS — R1031 Right lower quadrant pain: Secondary | ICD-10-CM

## 2023-12-27 DIAGNOSIS — R1032 Left lower quadrant pain: Secondary | ICD-10-CM

## 2023-12-27 MED ORDER — PANTOPRAZOLE SODIUM 40 MG PO TBEC: 40.0000 mg | DELAYED_RELEASE_TABLET | Freq: Every day | ORAL | 3 refills | Status: DC

## 2023-12-27 NOTE — Progress Notes (Signed)
 Gastroenterology Office Note     Primary Care Physician:  Newt Barefoot, FNP  Primary Gastroenterologist: Dr. Mordechai April   Chief Complaint   Chief Complaint  Patient presents with   Abdominal Pain    Follow up. Has concerns about the constant ache on right side.      History of Present Illness   Annette Saunders is a 78 y.o. female presenting today with a history of GERD, dysphagia,BPE April 2019 with prior fundoplication without recurrent hiatal hernia, otherwise normal..  Here for routine follow-up.  Chronic lower abdominal discomfort previously evaluated (last CT April 2022)   Saw GYN regarding LLQ and RLQ pain. No GYN etiology. RLQ pain in groin area and radiates to LLQ in groin area. No bulging. Movement sets it off, starting in the mornings. No pain after eating.    burping worse at night if eating the wrong things. Sometimes water  will set it off and salad. . Nexium  BID. Chronic. No other PPIs. No nausea. Feels GERD is controlled. Only has GERD if eating wrong stuff. Not waiting 2-3 hours after eating before laying down. No rectal bleeding.   PT upcoming for knee. She wants to focus on this prior to any PT for groin discomfort.   Dicyclomine  BID. No constipation.   Low back pain resolved.   EGD 2022 with benign-appearing esophageal stenosis, s/p dilation, gastritis s/p biopsy, normal duodenum. Biopsy with mild hyperemia, negative H.pylori.   2022 colonoscopy with tubular adenoma  Past Medical History:  Diagnosis Date   Anxiety    Back pain    Essential hypertension, benign 06/06/2019   Generalized osteoarthritis    GERD (gastroesophageal reflux disease)    Grade I diastolic dysfunction 12/16/2021   Hyperlipidemia    Hypothyroidism, adult    Insomnia    Joint pain    Lactose intolerance    Lower extremity edema    Malaise and fatigue 06/06/2019   Obesity (BMI 30.0-34.9) 06/06/2019   Stage 3a chronic kidney disease (CKD) (HCC) 12/16/2021    Past Surgical  History:  Procedure Laterality Date   BALLOON DILATION N/A 12/08/2020   Procedure: BALLOON DILATION;  Surgeon: Vinetta Greening, DO;  [facility];  Service: Endoscopy;  Laterality: N/A;   BIOPSY  12/08/2020   Procedure: BIOPSY;  Surgeon: Vinetta Greening, DO;  [facility];  Service: Endoscopy;;  gastric   COLONOSCOPY  10/2014   Dr. Shira Dopp, outside hospital. Colonoscopy performed with Propofol , no polyps, small to medium size internal hemorrhoids noted.    COLONOSCOPY WITH PROPOFOL  N/A 12/08/2020   fair prep, non-bleeding internal hemorrhoids, one 2 mm polyp in transverse colon (tubular adenoma)   ECTOPIC PREGNANCY SURGERY     ELBOW FRACTURE SURGERY     ESOPHAGOGASTRODUODENOSCOPY (EGD) WITH PROPOFOL  N/A 12/08/2020   benign-appearing esophageal stenosis, s/p dilation, gastritis s/p biopsy, normal duodenum. Biopsy with mild hyperemia, negative H.pylori   HIATAL HERNIA REPAIR     JOINT REPLACEMENT     bilateral knee   LIPOMA EXCISION Right 08/26/2020   Procedure: MINOR EXCISION LIPOMA;THIGH;  Surgeon: Awilda Bogus, MD;  Location: [facility];  Service: General;  Laterality: Right;   POLYPECTOMY  12/08/2020   Procedure: POLYPECTOMY;  Surgeon: Vinetta Greening, DO;  [facility];  Service: Endoscopy;;   TONSILLECTOMY     TOTAL KNEE REVISION Right 12/15/2021   Procedure: TOTAL KNEE REVISION;  Surgeon: Liliane Rei, MD;  Location: [facility];  Service: Orthopedics;  Laterality: Right;  TUBAL LIGATION      Current Outpatient Medications  Medication Sig Dispense Refill   acetaminophen  (TYLENOL ) 500 MG tablet Take 500 mg by mouth every 6 (six) hours as needed.     atorvastatin  (LIPITOR) 20 MG tablet Take 20 mg by mouth in the morning.     dicyclomine  (BENTYL ) 10 MG capsule TAKE 1 CAPSULE BY MOUTH TWICE DAILY AS NEEDED 90 capsule 0   escitalopram  (LEXAPRO ) 10 MG tablet TAKE 1 TABLET BY MOUTH EVERY DAY (Patient taking differently: Take 10 mg by mouth at  bedtime.) 90 tablet 0   esomeprazole  (NEXIUM ) 40 MG capsule TAKE 1 CAPSULE BY MOUTH TWICE DAILY BEFORE A MEAL 180 capsule 0   famotidine  (PEPCID ) 20 MG tablet TAKE 1 TABLET BY MOUTH AT BEDTIME 270 tablet 0   hydrochlorothiazide (HYDRODIURIL) 25 MG tablet Take 25 mg by mouth daily.     nystatin  (MYCOSTATIN /NYSTOP ) powder Apply 1 Application topically 3 (three) times daily. 60 g 0   nystatin  cream (MYCOSTATIN ) Apply to affected area 2 times daily 60 g 0   olmesartan (BENICAR) 20 MG tablet Take 40 mg by mouth daily.     topiramate  (TOPAMAX ) 25 MG capsule Take 25 mg by mouth 2 (two) times daily.     traMADol  (ULTRAM ) 50 MG tablet Take 50 mg by mouth 2 (two) times daily.     No current facility-administered medications for this visit.    Allergies as of 12/27/2023 - Review Complete 12/27/2023  Allergen Reaction Noted   Dilantin [phenytoin sodium extended] Other (See Comments) 03/28/2019   Doxycycline hyclate  12/27/2018   Lactose intolerance (gi)  12/15/2021    Family History  Problem Relation Age of Onset   Colon cancer Neg Hx     Social History   Socioeconomic History   Marital status: Widowed    Spouse name: Not on file   Number of children: Not on file   Years of education: Not on file   Highest education level: Not on file  Occupational History   Occupation: Retired Interior and spatial designer  Tobacco Use   Smoking status: Never   Smokeless tobacco: Never  Vaping Use   Vaping status: Never Used  Substance and Sexual Activity   Alcohol use: No    Alcohol/week: 0.0 standard drinks of alcohol   Drug use: No   Sexual activity: Not Currently    Birth control/protection: Post-menopausal, Abstinence, Surgical    Comment: tubal  Other Topics Concern   Not on file  Social History Narrative   Widow for 25 years-husband died of MI.Lives alone.   Social Drivers of Corporate investment banker Strain: Low Risk  (11/18/2022)   Overall Financial Resource Strain (CARDIA)    Difficulty of  Paying Living Expenses: Not hard at all  Food Insecurity: No Food Insecurity (11/18/2022)   Hunger Vital Sign    Worried About Running Out of Food in the Last Year: Never true    Ran Out of Food in the Last Year: Never true  Transportation Needs: No Transportation Needs (11/18/2022)   PRAPARE - Administrator, Civil Service (Medical): No    Lack of Transportation (Non-Medical): No  Physical Activity: Inactive (11/18/2022)   Exercise Vital Sign    Days of Exercise per Week: 0 days    Minutes of Exercise per Session: 0 min  Stress: No Stress Concern Present (11/18/2022)   [employer] of Occupational Health - Occupational Stress Questionnaire    Feeling of Stress : Not at  all  Social Connections: Socially Isolated (11/18/2022)   Social Connection and Isolation Panel [NHANES]    Frequency of Communication with Friends and Family: More than three times a week    Frequency of Social Gatherings with Friends and Family: Once a week    Attends Religious Services: Never    Database administrator or Organizations: No    Attends Banker Meetings: Never    Marital Status: Widowed  Intimate Partner Violence: Not At Risk (11/18/2022)   Humiliation, Afraid, Rape, and Kick questionnaire    Fear of Current or Ex-Partner: No    Emotionally Abused: No    Physically Abused: No    Sexually Abused: No     Review of Systems   Gen: Denies any fever, chills, fatigue, weight loss, lack of appetite.  CV: Denies chest pain, heart palpitations, peripheral edema, syncope.  Resp: Denies shortness of breath at rest or with exertion. Denies wheezing or cough.  GI: Denies dysphagia or odynophagia. Denies jaundice, hematemesis, fecal incontinence. GU : Denies urinary burning, urinary frequency, urinary hesitancy MS: Denies joint pain, muscle weakness, cramps, or limitation of movement.  Derm: Denies rash, itching, dry skin Psych: Denies depression, anxiety, memory loss, and  confusion Heme: Denies bruising, bleeding, and enlarged lymph nodes.   Physical Exam   BP 111/75   Pulse 74   Temp (!) 97.5 F (36.4 C)   Ht 5' (1.524 m)   Wt 204 lb (92.5 kg)   BMI 39.84 kg/m  General:   Alert and oriented. Pleasant and cooperative. Well-nourished and well-developed.  Head:  Normocephalic and atraumatic. Eyes:  Without icterus Abdomen:  +BS, soft, non-tender and non-distended. No HSM noted. No guarding or rebound. No masses appreciated.  Rectal:  Deferred  Msk:  Symmetrical without gross deformities. Normal posture. Extremities:  Without edema. Neurologic:  Alert and  oriented x4;  grossly normal neurologically. Skin:  Intact without significant lesions or rashes. Psych:  Alert and cooperative. Normal mood and affect.   Assessment   Annette Saunders is a 78 y.o. female presenting today with a history of  GERD, dysphagia, chronic bilateral groin discomfort, for routine follow-up.  GERD: controlled overall with Nexium  BID. However, she notes burping particularly in evening, endorses laying down shortly after eating, and worsened with certain foods. As she has been on Nexium  chronically, will change to pantoprazole  and use lowest effective dosing. Will start with once daily. She has no alarm signs/symptoms associated with this and EGD on file from 2022. I suspect diet/behavior related. Dysphagia resolved.  Chronic groin discomfort: primarily RLQ and sometimes LLQ. Exacerbated when getting up out of bed, movement. No bulging. Doubt hernia related. Appears musculoskeletal and has no association with bowel movements, food intake, etc. No fever./chills. CT completed in 2022 for same reason unrevealing. GYN has evaluated. Would recommend PT evaluation. Could be adhesive disease as well. She would like to hold off on this as it is unchanged from years prior. Wants to hold off on updated CT as well.    PLAN   Stop Nexium  BID Start pantoprazole  once each day, 30 minutes  before dinner Return in 6 months or sooner if needed    Delman Ferns, PhD, ANP-BC [facility]

## 2023-12-27 NOTE — Patient Instructions (Signed)
 Let's stop Nexium .  Instead, take pantoprazole  once each day 30 minutes before eating (you can do this in evening).  If you have breakthrough indigestion during the day, you can take pepcid  over the counter.  I will see you in 6 months or sooner if needed!  I enjoyed seeing you again today! I value our relationship and want to provide genuine, compassionate, and quality care. You may receive a survey regarding your visit with me, and I welcome your feedback! Thanks so much for taking the time to complete this. I look forward to seeing you again.      Delman Ferns, PhD, ANP-BC [facility]

## 2024-01-03 ENCOUNTER — Ambulatory Visit: Payer: Self-pay

## 2024-01-26 DIAGNOSIS — M533 Sacrococcygeal disorders, not elsewhere classified: Secondary | ICD-10-CM | POA: Diagnosis not present

## 2024-01-26 DIAGNOSIS — M542 Cervicalgia: Secondary | ICD-10-CM | POA: Diagnosis not present

## 2024-01-26 DIAGNOSIS — M792 Neuralgia and neuritis, unspecified: Secondary | ICD-10-CM | POA: Diagnosis not present

## 2024-01-26 DIAGNOSIS — Z79899 Other long term (current) drug therapy: Secondary | ICD-10-CM | POA: Diagnosis not present

## 2024-01-26 DIAGNOSIS — M5459 Other low back pain: Secondary | ICD-10-CM | POA: Diagnosis not present

## 2024-01-26 DIAGNOSIS — M7918 Myalgia, other site: Secondary | ICD-10-CM | POA: Diagnosis not present

## 2024-01-29 ENCOUNTER — Other Ambulatory Visit: Payer: Self-pay | Admitting: Gastroenterology

## 2024-03-05 ENCOUNTER — Other Ambulatory Visit (HOSPITAL_COMMUNITY): Payer: Self-pay | Admitting: Anesthesiology

## 2024-03-05 DIAGNOSIS — M546 Pain in thoracic spine: Secondary | ICD-10-CM

## 2024-03-05 DIAGNOSIS — Z79899 Other long term (current) drug therapy: Secondary | ICD-10-CM | POA: Diagnosis not present

## 2024-03-05 DIAGNOSIS — M542 Cervicalgia: Secondary | ICD-10-CM | POA: Diagnosis not present

## 2024-03-05 DIAGNOSIS — M7918 Myalgia, other site: Secondary | ICD-10-CM | POA: Diagnosis not present

## 2024-03-05 DIAGNOSIS — M792 Neuralgia and neuritis, unspecified: Secondary | ICD-10-CM | POA: Diagnosis not present

## 2024-03-12 ENCOUNTER — Other Ambulatory Visit (HOSPITAL_COMMUNITY): Payer: Self-pay | Admitting: Anesthesiology

## 2024-03-12 DIAGNOSIS — R0789 Other chest pain: Secondary | ICD-10-CM

## 2024-03-15 ENCOUNTER — Other Ambulatory Visit: Payer: Self-pay | Admitting: Gastroenterology

## 2024-03-19 DIAGNOSIS — M47814 Spondylosis without myelopathy or radiculopathy, thoracic region: Secondary | ICD-10-CM | POA: Diagnosis not present

## 2024-03-27 ENCOUNTER — Ambulatory Visit (INDEPENDENT_AMBULATORY_CARE_PROVIDER_SITE_OTHER): Admitting: Gastroenterology

## 2024-03-27 ENCOUNTER — Encounter: Payer: Self-pay | Admitting: *Deleted

## 2024-03-27 VITALS — BP 112/74 | HR 84 | Temp 98.5°F | Ht 61.0 in | Wt 192.6 lb

## 2024-03-27 DIAGNOSIS — K219 Gastro-esophageal reflux disease without esophagitis: Secondary | ICD-10-CM | POA: Diagnosis not present

## 2024-03-27 DIAGNOSIS — R1312 Dysphagia, oropharyngeal phase: Secondary | ICD-10-CM | POA: Diagnosis not present

## 2024-03-27 NOTE — Progress Notes (Signed)
 Gastroenterology Office Note     Primary Care Physician:  Aura Portal, FNP  Primary Gastroenterologist: Dr. Cindie   Chief Complaint   Chief Complaint  Patient presents with   Follow-up    Follow up on GERD. Pt states she feels better     History of Present Illness   Annette Saunders is a 78 y.o. female presenting today with a history of GERD, dysphagia,BPE April 2019 with prior fundoplication without recurrent hiatal hernia, otherwise normal..  Here for routine follow-up. Last seen in April 2025.   She had issues with GERD exacerbations at last visit, so we changed from Nexium  BID to pantoprazole  BID. She had abdominal pain with this and N/V. Once stopping pantoprazole , this resolved. She is doing wonderfully now on Nexium  BID and will take pepcid  at night.  She has been on dicyclomine  low dose BID long-term that has helped with intermittent cramping.   She notes at times she feels food caught in right side of her neck after swallowing; she will then lean over and cough and get this to go down correct way.   Currently she is seeing ortho (Dr Laqueta) regarding hip pain and upper left thoracic pain. She is s/p thoracotomy approach in the past for hiatal hernia repair. She now has defect in this area with bulging, worsens with movement and exertion. She notes this is her biggest concern and has upcoming CT. She has been referred to thoracic specialist in the future and awaiting this.     EGD 2022 with benign-appearing esophageal stenosis, s/p dilation, gastritis s/p biopsy, normal duodenum. Biopsy with mild hyperemia, negative H.pylori.    2022 colonoscopy with tubular adenoma     Past Medical History:  Diagnosis Date   Anxiety    Back pain    Essential hypertension, benign 06/06/2019   Generalized osteoarthritis    GERD (gastroesophageal reflux disease)    Grade I diastolic dysfunction 12/16/2021   Hyperlipidemia    Hypothyroidism, adult    Insomnia    Joint pain     Lactose intolerance    Lower extremity edema    Malaise and fatigue 06/06/2019   Obesity (BMI 30.0-34.9) 06/06/2019   Stage 3a chronic kidney disease (CKD) (HCC) 12/16/2021    Past Surgical History:  Procedure Laterality Date   BALLOON DILATION N/A 12/08/2020   Procedure: BALLOON DILATION;  Surgeon: Cindie Carlin POUR, DO;  [facility];  Service: Endoscopy;  Laterality: N/A;   BIOPSY  12/08/2020   Procedure: BIOPSY;  Surgeon: Cindie Carlin POUR, DO;  [facility]: [facility];  Service: Endoscopy;;  gastric   COLONOSCOPY  10/2014   Dr. Gregor, outside hospital. Colonoscopy performed with Propofol , no polyps, small to medium size internal hemorrhoids noted.    COLONOSCOPY WITH PROPOFOL  N/A 12/08/2020   fair prep, non-bleeding internal hemorrhoids, one 2 mm polyp in transverse colon (tubular adenoma)   ECTOPIC PREGNANCY SURGERY     ELBOW FRACTURE SURGERY     ESOPHAGOGASTRODUODENOSCOPY (EGD) WITH PROPOFOL  N/A 12/08/2020   benign-appearing esophageal stenosis, s/p dilation, gastritis s/p biopsy, normal duodenum. Biopsy with mild hyperemia, negative H.pylori   HIATAL HERNIA REPAIR     JOINT REPLACEMENT     bilateral knee   LIPOMA EXCISION Right 08/26/2020   Procedure: MINOR EXCISION LIPOMA;THIGH;  Surgeon: Kallie Manuelita BROCKS, MD;  Location: [facility];  Service: General;  Laterality: Right;   POLYPECTOMY  12/08/2020   Procedure: POLYPECTOMY;  Surgeon: Cindie Carlin POUR, DO;  [facility];  Service: Endoscopy;;   TONSILLECTOMY     TOTAL KNEE REVISION Right 12/15/2021   Procedure: TOTAL KNEE REVISION;  Surgeon: Melodi Lerner, MD;  Location: [facility];  Service: Orthopedics;  Laterality: Right;   TUBAL LIGATION      Current Outpatient Medications  Medication Sig Dispense Refill   acetaminophen  (TYLENOL ) 500 MG tablet Take 500 mg by mouth every 6 (six) hours as needed.     atorvastatin  (LIPITOR) 20 MG tablet Take 20 mg by mouth in the morning.     dicyclomine  (BENTYL ) 10 MG  capsule TAKE 1 CAPSULE BY MOUTH TWICE DAILY AS NEEDED 90 capsule 0   escitalopram  (LEXAPRO ) 10 MG tablet TAKE 1 TABLET BY MOUTH EVERY DAY 90 tablet 0   esomeprazole  (NEXIUM ) 40 MG capsule TAKE 1 CAPSULE BY MOUTH TWICE DAILY BEFORE A MEAL 180 capsule 0   famotidine  (PEPCID ) 20 MG tablet TAKE 1 TABLET BY MOUTH AT BEDTIME 270 tablet 0   hydrochlorothiazide (HYDRODIURIL) 25 MG tablet Take 25 mg by mouth daily.     nystatin  (MYCOSTATIN /NYSTOP ) powder Apply 1 Application topically 3 (three) times daily. 60 g 0   nystatin  cream (MYCOSTATIN ) Apply to affected area 2 times daily 60 g 0   olmesartan (BENICAR) 20 MG tablet Take 40 mg by mouth daily.     topiramate  (TOPAMAX ) 25 MG capsule Take 25 mg by mouth 2 (two) times daily.     traMADol  (ULTRAM ) 50 MG tablet Take 50 mg by mouth 2 (two) times daily.     No current facility-administered medications for this visit.    Allergies as of 03/27/2024 - Review Complete 03/27/2024  Allergen Reaction Noted   Dilantin [phenytoin sodium extended] Other (See Comments) 03/28/2019   Doxycycline hyclate  12/27/2018   Lactose intolerance (gi)  12/15/2021   Pantoprazole  Nausea And Vomiting 03/27/2024    Family History  Problem Relation Age of Onset   Colon cancer Neg Hx     Social History   Socioeconomic History   Marital status: Widowed    Spouse name: Not on file   Number of children: Not on file   Years of education: Not on file   Highest education level: Not on file  Occupational History   Occupation: Retired Interior and spatial designer  Tobacco Use   Smoking status: Never   Smokeless tobacco: Never  Vaping Use   Vaping status: Never Used  Substance and Sexual Activity   Alcohol use: No    Alcohol/week: 0.0 standard drinks of alcohol   Drug use: No   Sexual activity: Not Currently    Birth control/protection: Post-menopausal, Abstinence, Surgical    Comment: tubal  Other Topics Concern   Not on file  Social History Narrative   Widow for 25 years-husband  died of MI.Lives alone.   Social Drivers of Corporate investment banker Strain: Low Risk  (11/18/2022)   Overall Financial Resource Strain (CARDIA)    Difficulty of Paying Living Expenses: Not hard at all  Food Insecurity: No Food Insecurity (11/18/2022)   Hunger Vital Sign    Worried About Running Out of Food in the Last Year: Never true    Ran Out of Food in the Last Year: Never true  Transportation Needs: No Transportation Needs (11/18/2022)   PRAPARE - Administrator, Civil Service (Medical): No    Lack of Transportation (Non-Medical): No  Physical Activity: Inactive (11/18/2022)   Exercise Vital Sign    Days of Exercise per Week: 0 days  Minutes of Exercise per Session: 0 min  Stress: No Stress Concern Present (11/18/2022)   [employer] of Occupational Health - Occupational Stress Questionnaire    Feeling of Stress : Not at all  Social Connections: Socially Isolated (11/18/2022)   Social Connection and Isolation Panel    Frequency of Communication with Friends and Family: More than three times a week    Frequency of Social Gatherings with Friends and Family: Once a week    Attends Religious Services: Never    Database administrator or Organizations: No    Attends Banker Meetings: Never    Marital Status: Widowed  Intimate Partner Violence: Not At Risk (11/18/2022)   Humiliation, Afraid, Rape, and Kick questionnaire    Fear of Current or Ex-Partner: No    Emotionally Abused: No    Physically Abused: No    Sexually Abused: No     Review of Systems   Gen: Denies any fever, chills, fatigue, weight loss, lack of appetite.  CV: Denies chest pain, heart palpitations, peripheral edema, syncope.  Resp: Denies shortness of breath at rest or with exertion. Denies wheezing or cough.  GI: Denies dysphagia or odynophagia. Denies jaundice, hematemesis, fecal incontinence. GU : Denies urinary burning, urinary frequency, urinary hesitancy MS: Denies joint  pain, muscle weakness, cramps, or limitation of movement.  Derm: Denies rash, itching, dry skin Psych: Denies depression, anxiety, memory loss, and confusion Heme: Denies bruising, bleeding, and enlarged lymph nodes.   Physical Exam   BP 112/74   Pulse 84   Temp 98.5 F (36.9 C)   Ht 5' 1 (1.549 m)   Wt 192 lb 9.6 oz (87.4 kg)   BMI 36.39 kg/m  General:   Alert and oriented. Pleasant and cooperative. Well-nourished and well-developed.  Head:  Normocephalic and atraumatic. Eyes:  Without icterus Abdomen:  +BS, soft, non-tender and non-distended. No HSM noted. No guarding or rebound. No masses appreciated. Left upper back with thoracotomy incision and obvious bulging with exertion.  Rectal:  Deferred   Neurologic:  Alert and  oriented x4;  grossly normal neurologically. Skin:  Intact without significant lesions or rashes. Psych:  Alert and cooperative. Normal mood and affect.   Assessment   Annette Saunders is a delightful 78 year-old female returning today for GERD and IBS follow-up.  GERD is best controlled on Nexium  BID: she had N/V with pantoprazole , so we will avoid this.   Oropharyngeal dysphagia: interesting reports of feeling food caught only in right-side of neck just below jaw and loosened by bending over. Will pursue BPE. ?zenkers, aspiration, etc.      PLAN    Nexium  BID, can take pepcid  just prn in evening BPE Can continue dicyclomine  BID, monitor for constipation Return in 6 months or sooner if needed   Therisa MICAEL Stager, PhD, ANP-BC [facility]

## 2024-03-27 NOTE — Patient Instructions (Signed)
 I am arranging a special swallow test.   Continue Nexium  twice a day as you are doing!  Thank you so much for the yummy food! I can't wait to try it tonight!!  We can see you in  6 months or sooner if needed!   I enjoyed seeing you again today! I value our relationship and want to provide genuine, compassionate, and quality care. You may receive a survey regarding your visit with me, and I welcome your feedback! Thanks so much for taking the time to complete this. I look forward to seeing you again.      Therisa MICAEL Stager, PhD, ANP-BC [facility]enter[facility]

## 2024-03-29 DIAGNOSIS — I890 Lymphedema, not elsewhere classified: Secondary | ICD-10-CM | POA: Diagnosis not present

## 2024-03-29 DIAGNOSIS — I872 Venous insufficiency (chronic) (peripheral): Secondary | ICD-10-CM | POA: Diagnosis not present

## 2024-03-29 DIAGNOSIS — M79661 Pain in right lower leg: Secondary | ICD-10-CM | POA: Diagnosis not present

## 2024-03-29 DIAGNOSIS — R600 Localized edema: Secondary | ICD-10-CM | POA: Diagnosis not present

## 2024-03-29 DIAGNOSIS — I83893 Varicose veins of bilateral lower extremities with other complications: Secondary | ICD-10-CM | POA: Diagnosis not present

## 2024-04-03 ENCOUNTER — Other Ambulatory Visit (HOSPITAL_COMMUNITY)

## 2024-04-05 ENCOUNTER — Encounter (HOSPITAL_COMMUNITY): Payer: Self-pay

## 2024-04-05 ENCOUNTER — Ambulatory Visit (HOSPITAL_COMMUNITY)

## 2024-04-08 ENCOUNTER — Ambulatory Visit

## 2024-04-09 DIAGNOSIS — N1831 Chronic kidney disease, stage 3a: Secondary | ICD-10-CM | POA: Diagnosis not present

## 2024-04-09 DIAGNOSIS — E782 Mixed hyperlipidemia: Secondary | ICD-10-CM | POA: Diagnosis not present

## 2024-04-09 DIAGNOSIS — S20361A Insect bite (nonvenomous) of right front wall of thorax, initial encounter: Secondary | ICD-10-CM | POA: Diagnosis not present

## 2024-04-09 DIAGNOSIS — L304 Erythema intertrigo: Secondary | ICD-10-CM | POA: Diagnosis not present

## 2024-04-09 DIAGNOSIS — L258 Unspecified contact dermatitis due to other agents: Secondary | ICD-10-CM | POA: Diagnosis not present

## 2024-04-12 ENCOUNTER — Other Ambulatory Visit (HOSPITAL_COMMUNITY)

## 2024-04-12 ENCOUNTER — Encounter (HOSPITAL_COMMUNITY): Payer: Self-pay

## 2024-04-17 ENCOUNTER — Other Ambulatory Visit (HOSPITAL_COMMUNITY): Payer: Self-pay | Admitting: Family Medicine

## 2024-04-17 DIAGNOSIS — I700 Atherosclerosis of aorta: Secondary | ICD-10-CM

## 2024-04-17 DIAGNOSIS — R7401 Elevation of levels of liver transaminase levels: Secondary | ICD-10-CM | POA: Diagnosis not present

## 2024-04-17 DIAGNOSIS — E782 Mixed hyperlipidemia: Secondary | ICD-10-CM | POA: Diagnosis not present

## 2024-04-17 DIAGNOSIS — R7303 Prediabetes: Secondary | ICD-10-CM | POA: Diagnosis not present

## 2024-04-17 DIAGNOSIS — G8929 Other chronic pain: Secondary | ICD-10-CM | POA: Diagnosis not present

## 2024-04-17 DIAGNOSIS — I10 Essential (primary) hypertension: Secondary | ICD-10-CM | POA: Diagnosis not present

## 2024-04-17 DIAGNOSIS — K219 Gastro-esophageal reflux disease without esophagitis: Secondary | ICD-10-CM | POA: Diagnosis not present

## 2024-04-17 DIAGNOSIS — M545 Low back pain, unspecified: Secondary | ICD-10-CM | POA: Diagnosis not present

## 2024-04-18 DIAGNOSIS — H43393 Other vitreous opacities, bilateral: Secondary | ICD-10-CM | POA: Diagnosis not present

## 2024-04-18 DIAGNOSIS — H524 Presbyopia: Secondary | ICD-10-CM | POA: Diagnosis not present

## 2024-04-19 ENCOUNTER — Encounter: Payer: Self-pay | Admitting: Radiology

## 2024-04-27 ENCOUNTER — Ambulatory Visit (HOSPITAL_COMMUNITY)
Admission: RE | Admit: 2024-04-27 | Discharge: 2024-04-27 | Disposition: A | Discharge status: Home / Self Care | Source: Ambulatory Visit | Attending: Anesthesiology | Admitting: Anesthesiology

## 2024-04-27 DIAGNOSIS — M546 Pain in thoracic spine: Secondary | ICD-10-CM

## 2024-05-02 ENCOUNTER — Other Ambulatory Visit: Payer: Self-pay | Admitting: Gastroenterology

## 2024-05-08 ENCOUNTER — Encounter (HOSPITAL_COMMUNITY): Payer: Self-pay

## 2024-05-08 ENCOUNTER — Ambulatory Visit (HOSPITAL_COMMUNITY): Admission: RE | Admit: 2024-05-08 | Source: Ambulatory Visit

## 2024-05-12 ENCOUNTER — Other Ambulatory Visit: Payer: Self-pay | Admitting: Gastroenterology

## 2024-05-12 DIAGNOSIS — K219 Gastro-esophageal reflux disease without esophagitis: Secondary | ICD-10-CM

## 2024-05-16 ENCOUNTER — Ambulatory Visit (HOSPITAL_COMMUNITY)
Admission: RE | Admit: 2024-05-16 | Discharge: 2024-05-16 | Disposition: A | Discharge status: Home / Self Care | Source: Ambulatory Visit | Attending: Anesthesiology | Admitting: Anesthesiology

## 2024-05-16 DIAGNOSIS — R0789 Other chest pain: Secondary | ICD-10-CM | POA: Diagnosis not present

## 2024-05-16 LAB — POCT I-STAT CREATININE: Creatinine, Ser: 1.3 mg/dL — ABNORMAL HIGH (ref 0.44–1.00)

## 2024-05-16 MED ORDER — IOHEXOL 300 MG/ML  SOLN
60.0000 mL | Freq: Once | INTRAMUSCULAR | Status: AC | PRN
  Administered 2024-05-16: 60 mL via INTRAVENOUS

## 2024-05-23 DIAGNOSIS — M47816 Spondylosis without myelopathy or radiculopathy, lumbar region: Secondary | ICD-10-CM | POA: Diagnosis not present

## 2024-05-23 DIAGNOSIS — M546 Pain in thoracic spine: Secondary | ICD-10-CM | POA: Diagnosis not present

## 2024-05-23 DIAGNOSIS — Z79899 Other long term (current) drug therapy: Secondary | ICD-10-CM | POA: Diagnosis not present

## 2024-05-23 DIAGNOSIS — M7918 Myalgia, other site: Secondary | ICD-10-CM | POA: Diagnosis not present

## 2024-05-25 ENCOUNTER — Other Ambulatory Visit: Payer: Self-pay | Admitting: Gastroenterology

## 2024-06-18 DIAGNOSIS — M47817 Spondylosis without myelopathy or radiculopathy, lumbosacral region: Secondary | ICD-10-CM | POA: Diagnosis not present

## 2024-06-18 DIAGNOSIS — M47816 Spondylosis without myelopathy or radiculopathy, lumbar region: Secondary | ICD-10-CM | POA: Diagnosis not present

## 2024-07-01 ENCOUNTER — Encounter: Payer: Self-pay | Admitting: Radiology

## 2024-07-07 ENCOUNTER — Ambulatory Visit
Admission: EM | Admit: 2024-07-07 | Discharge: 2024-07-07 | Disposition: A | Discharge status: Home / Self Care | Attending: Nurse Practitioner | Admitting: Nurse Practitioner

## 2024-07-07 DIAGNOSIS — Z23 Encounter for immunization: Secondary | ICD-10-CM | POA: Diagnosis not present

## 2024-07-07 DIAGNOSIS — S61213A Laceration without foreign body of left middle finger without damage to nail, initial encounter: Secondary | ICD-10-CM | POA: Diagnosis not present

## 2024-07-07 MED ORDER — TETANUS-DIPHTH-ACELL PERTUSSIS 5-2-15.5 LF-MCG/0.5 IM SUSP
0.5000 mL | Freq: Once | INTRAMUSCULAR | Status: AC
  Administered 2024-07-07: 0.5 mL via INTRAMUSCULAR

## 2024-07-07 NOTE — ED Provider Notes (Signed)
 [facility]    CSN: [id number] Arrival date & time: 07/07/24  1351      History   Chief Complaint Chief Complaint  Patient presents with   finger laceration    HPI Annette Saunders is a 78 y.o. female.   The history is provided by the patient.   Patient presents for a injury to the left middle finger.  Patient states she was using a mandolin, and cut the tip of the left middle finger.  Patient states she had moderate bleeding when the injury occurred, but bleeding is controlled at this time.  Patient denies numbness, tingling, radiation of pain, or decreased range of motion.  Patient denies use of blood thinning medications.  States she is unsure of when she received her last tetanus vaccine.  Past Medical History:  Diagnosis Date   Anxiety    Back pain    Essential hypertension, benign 06/06/2019   Generalized osteoarthritis    GERD (gastroesophageal reflux disease)    Grade I diastolic dysfunction 12/16/2021   Hyperlipidemia    Hypothyroidism, adult    Insomnia    Joint pain    Lactose intolerance    Lower extremity edema    Malaise and fatigue 06/06/2019   Obesity (BMI 30.0-34.9) 06/06/2019   Stage 3a chronic kidney disease (CKD) (HCC) 12/16/2021    Patient Active Problem List   Diagnosis Date Noted   Nausea without vomiting 04/25/2023   LLQ pain 11/18/2022   Osteoarthritis of left hip 11/18/2022   Screening mammogram for breast cancer 11/18/2022   Cyst of ovary, right 11/18/2022   Polyphagia 10/25/2022   Essential hypertension 09/12/2022   Chronic kidney disease, stage 3b (HCC) 07/25/2022   Eating disorder 07/25/2022   Morbid obesity (HCC) 07/25/2022   Medication management 05/18/2022   Prediabetes 05/18/2022   Acute postoperative anemia due to expected blood loss 12/17/2021   Acute renal failure superimposed on stage 3a chronic kidney disease (HCC) 12/16/2021   Hyperkalemia 12/16/2021   Normocytic anemia 12/16/2021   Hyponatremia 12/16/2021    Stage 3a chronic kidney disease (CKD) (HCC) 12/16/2021   Grade I diastolic dysfunction 12/16/2021   Failed total knee arthroplasty 12/15/2021   Chronic RLQ pain 08/10/2021   Pain of muscle of abdomen 08/10/2021   Generalized anxiety disorder 07/02/2021   Mixed hyperlipidemia 05/24/2021   Chronic insomnia 05/05/2021   Benign lipomatous neoplasm of skin, subcu of right leg    Lipoma 08/18/2020   Fatigue 06/09/2020   Vitamin D deficiency 06/09/2020   Epigastric pain    Non-intractable vomiting    SBO (small bowel obstruction) (HCC)    Class 2 obesity due to excess calories with body mass index (BMI) of 37.0 to 37.9 in adult    Ileus (HCC) 10/31/2019   Shortness of breath 10/16/2019   Encounter to discuss test results 10/16/2019   Renal insufficiency 10/16/2019   Healthcare maintenance 10/16/2019   Otitis media 10/16/2019   Breast pain 10/16/2019   Abdominal pain 09/17/2019   Essential hypertension, benign 06/06/2019   Malaise and fatigue 06/06/2019   Obesity (BMI 30.0-34.9) 06/06/2019   Hypothyroidism, adult 06/06/2019   Diarrhea 03/28/2019   Pain due to total right knee replacement 05/21/2018   Dysphagia 12/14/2017   GERD (gastroesophageal reflux disease) 10/27/2016    Past Surgical History:  Procedure Laterality Date   BALLOON DILATION N/A 12/08/2020   Procedure: BALLOON DILATION;  Surgeon: Cindie Carlin POUR, DO;  [facility];  Service: Endoscopy;  Laterality: N/A;  BIOPSY  12/08/2020   Procedure: BIOPSY;  Surgeon: Cindie Carlin POUR, DO;  [facility];  Service: Endoscopy;;  gastric   COLONOSCOPY  10/2014   Dr. Gregor, outside hospital. Colonoscopy performed with Propofol , no polyps, small to medium size internal hemorrhoids noted.    COLONOSCOPY WITH PROPOFOL  N/A 12/08/2020   fair prep, non-bleeding internal hemorrhoids, one 2 mm polyp in transverse colon (tubular adenoma)   ECTOPIC PREGNANCY SURGERY     ELBOW FRACTURE SURGERY      ESOPHAGOGASTRODUODENOSCOPY (EGD) WITH PROPOFOL  N/A 12/08/2020   benign-appearing esophageal stenosis, s/p dilation, gastritis s/p biopsy, normal duodenum. Biopsy with mild hyperemia, negative H.pylori   HIATAL HERNIA REPAIR     JOINT REPLACEMENT     bilateral knee   LIPOMA EXCISION Right 08/26/2020   Procedure: MINOR EXCISION LIPOMA;THIGH;  Surgeon: Kallie Manuelita BROCKS, MD;  Location: [facility];  Service: General;  Laterality: Right;   POLYPECTOMY  12/08/2020   Procedure: POLYPECTOMY;  Surgeon: Cindie Carlin POUR, DO;  [facility];  Service: Endoscopy;;   TONSILLECTOMY     TOTAL KNEE REVISION Right 12/15/2021   Procedure: TOTAL KNEE REVISION;  Surgeon: Melodi Lerner, MD;  Location: [facility];  Service: Orthopedics;  Laterality: Right;   TUBAL LIGATION      OB History     Gravida  13   Para  1   Term  1   Preterm      AB  12   Living  1      SAB  11   IAB      Ectopic  1   Multiple      Live Births  1            Home Medications    Prior to Admission medications   Medication Sig Start Date End Date Taking? Authorizing Provider  acetaminophen  (TYLENOL ) 500 MG tablet Take 500 mg by mouth every 6 (six) hours as needed.    [provider]  atorvastatin  (LIPITOR) 20 MG tablet Take 20 mg by mouth in the morning.    [provider]  dicyclomine  (BENTYL ) 10 MG capsule TAKE 1 CAPSULE BY MOUTH TWICE DAILY AS NEEDED 05/02/24   Shirlean Therisa ORN, NP  escitalopram  (LEXAPRO ) 10 MG tablet TAKE 1 TABLET BY MOUTH EVERY DAY 03/11/21   Caswell Ronnell BROCKS, MD  esomeprazole  (NEXIUM ) 40 MG capsule TAKE 1 CAPSULE BY MOUTH TWICE DAILY BEFORE A MEAL 05/13/24   Shirlean Therisa ORN, NP  famotidine  (PEPCID ) 20 MG tablet TAKE 1 TABLET BY MOUTH AT BEDTIME 05/27/24   Shirlean Therisa ORN, NP  hydrochlorothiazide (HYDRODIURIL) 25 MG tablet Take 25 mg by mouth daily.    [provider]  nystatin  (MYCOSTATIN /NYSTOP ) powder Apply 1 Application topically 3 (three) times daily. 05/13/23    Leath-Warren, Etta PARAS, NP  nystatin  cream (MYCOSTATIN ) Apply to affected area 2 times daily 05/13/23   Leath-Warren, Etta PARAS, NP  olmesartan (BENICAR) 20 MG tablet Take 40 mg by mouth daily.    [provider]  topiramate  (TOPAMAX ) 25 MG capsule Take 25 mg by mouth 2 (two) times daily.    [provider]  traMADol  (ULTRAM ) 50 MG tablet Take 50 mg by mouth 2 (two) times daily.    [provider]    Family History Family History  Problem Relation Age of Onset   Colon cancer Neg Hx     Social History Social History   Tobacco Use   Smoking status: Never  Smokeless tobacco: Never  Vaping Use   Vaping status: Never Used  Substance Use Topics   Alcohol use: No    Alcohol/week: 0.0 standard drinks of alcohol   Drug use: No     Allergies   Dilantin [phenytoin sodium extended], Doxycycline hyclate, Lactose intolerance (gi), and Pantoprazole    Review of Systems Review of Systems Per HPI  Physical Exam Triage Vital Signs ED Triage Vitals  Encounter Vitals Group     BP 07/07/24 1403 127/73     Girls Systolic BP Percentile --      Girls Diastolic BP Percentile --      Boys Systolic BP Percentile --      Boys Diastolic BP Percentile --      Pulse Rate 07/07/24 1403 90     Resp 07/07/24 1403 20     Temp 07/07/24 1403 98.1 F (36.7 C)     Temp Source 07/07/24 1403 Oral     SpO2 07/07/24 1403 98 %     Weight --      Height --      Head Circumference --      Peak Flow --      Pain Score 07/07/24 1406 5     Pain Loc --      Pain Education --      Exclude from Growth Chart --    No data found.  Updated Vital Signs BP 127/73 (BP Location: Right Arm)   Pulse 90   Temp 98.1 F (36.7 C) (Oral)   Resp 20   SpO2 98%   Visual Acuity Right Eye Distance:   Left Eye Distance:   Bilateral Distance:    Right Eye Near:   Left Eye Near:    Bilateral Near:     Physical Exam Vitals and nursing note reviewed.  Constitutional:      General:  She is not in acute distress.    Appearance: Normal appearance.  HENT:     Head: Normocephalic.  Eyes:     Extraocular Movements: Extraocular movements intact.     Pupils: Pupils are equal, round, and reactive to light.  Cardiovascular:     Rate and Rhythm: Normal rate and regular rhythm.     Pulses: Normal pulses.     Heart sounds: Normal heart sounds.  Pulmonary:     Effort: Pulmonary effort is normal.     Breath sounds: Normal breath sounds.  Musculoskeletal:     Cervical back: Normal range of motion.  Skin:    General: Skin is warm and dry.     Findings: Laceration present.     Comments: Avulsion injury to the distal tip of the left middle finger.  Bleeding is controlled at this time.  No nailbed involvement.  Neurological:     General: No focal deficit present.     Mental Status: She is alert and oriented to person, place, and time.  Psychiatric:        Mood and Affect: Mood normal.        Behavior: Behavior normal.      UC Treatments / Results  Labs (all labs ordered are listed, but only abnormal results are displayed) Labs Reviewed - No data to display  EKG   Radiology No results found.  Procedures Procedures (including critical care time)  Medications Ordered in UC Medications  Tdap (ADACEL) injection 0.5 mL (has no administration in time range)    Initial Impression / Assessment and Plan / UC Course  I  have reviewed the triage vital signs and the nursing notes.  Pertinent labs & imaging results that were available during my care of the patient were reviewed by me and considered in my medical decision making (see chart for details).  Patient with laceration to the tip of the left middle finger after using a mandolin.  Unable to provide suturing due to the injury.  Bleeding is controlled at this time.  Tdap was updated today.  Dressing was applied.  Supportive care recommendations were provided and discussed with the patient to include keeping the area  clean and dry, over-the-counter Tylenol  for pain or discomfort, and to monitor for signs of infection.  Patient was given indications regarding follow-up.  Patient was in agreement with this plan of care and verbalizes understanding.  All questions were answered.  Patient stable for discharge.  Final Clinical Impressions(s) / UC Diagnoses   Final diagnoses:  Laceration of left middle finger without foreign body without damage to nail, initial encounter     Discharge Instructions      Your Tdap was updated today.  It is good for the next 10 years. Keep the dressing in place for the next 24 hours. You may take over-the-counter Tylenol  as needed for pain or discomfort. Do not submerge the left hand underwater for the next 24 hours. When you are out in public, recommend keeping the area covered.  When you are home, recommend leaving the area open to air to help with healing. You may apply Neosporin to the affected area daily as needed. Monitor the area for signs of worsening.  Seek care if you develop increased redness, swelling, or foul-smelling drainage from the site, or other concerns. Follow-up as needed.      ED Prescriptions   None    PDMP not reviewed this encounter.   Gilmer Etta PARAS, NP 07/07/24 1425

## 2024-07-07 NOTE — ED Triage Notes (Addendum)
 Pt reports she has sliced her left middle finger on a mandoline  about 1 hour ago.  Pt not on blood thinner

## 2024-07-07 NOTE — Discharge Instructions (Signed)
 Your Tdap was updated today.  It is good for the next 10 years. Keep the dressing in place for the next 24 hours. You may take over-the-counter Tylenol  as needed for pain or discomfort. Do not submerge the left hand underwater for the next 24 hours. When you are out in public, recommend keeping the area covered.  When you are home, recommend leaving the area open to air to help with healing. You may apply Neosporin to the affected area daily as needed. Monitor the area for signs of worsening.  Seek care if you develop increased redness, swelling, or foul-smelling drainage from the site, or other concerns. Follow-up as needed.

## 2024-07-24 ENCOUNTER — Other Ambulatory Visit: Payer: Self-pay | Admitting: Gastroenterology

## 2024-07-31 ENCOUNTER — Other Ambulatory Visit: Payer: Self-pay | Admitting: Gastroenterology

## 2024-08-01 ENCOUNTER — Encounter: Payer: Self-pay | Admitting: Gastroenterology

## 2024-08-31 ENCOUNTER — Other Ambulatory Visit: Payer: Self-pay | Admitting: Gastroenterology

## 2024-11-07 ENCOUNTER — Ambulatory Visit: Admitting: Gastroenterology
